# Patient Record
Sex: Female | Born: 1950 | Race: White | Hispanic: No | Marital: Married | Smoking: Never smoker
Health system: Southern US, Community
[De-identification: ages and names within clinical notes are randomized; demographics above are authoritative.]

## PROBLEM LIST (undated history)

## (undated) DIAGNOSIS — K219 Gastro-esophageal reflux disease without esophagitis: Secondary | ICD-10-CM

## (undated) DIAGNOSIS — H9190 Unspecified hearing loss, unspecified ear: Secondary | ICD-10-CM

## (undated) DIAGNOSIS — M1990 Unspecified osteoarthritis, unspecified site: Secondary | ICD-10-CM

## (undated) DIAGNOSIS — D689 Coagulation defect, unspecified: Secondary | ICD-10-CM

## (undated) DIAGNOSIS — Z86010 Personal history of colonic polyps: Secondary | ICD-10-CM

## (undated) DIAGNOSIS — IMO0001 Reserved for inherently not codable concepts without codable children: Secondary | ICD-10-CM

## (undated) DIAGNOSIS — D131 Benign neoplasm of stomach: Secondary | ICD-10-CM

## (undated) DIAGNOSIS — Z9889 Other specified postprocedural states: Secondary | ICD-10-CM

## (undated) DIAGNOSIS — R112 Nausea with vomiting, unspecified: Secondary | ICD-10-CM

## (undated) DIAGNOSIS — K589 Irritable bowel syndrome without diarrhea: Secondary | ICD-10-CM

## (undated) HISTORY — PX: KNEE ARTHROSCOPY: SUR90

## (undated) HISTORY — PX: TOTAL KNEE ARTHROPLASTY: SHX125

## (undated) HISTORY — DX: Personal history of colonic polyps: Z86.010

## (undated) HISTORY — DX: Benign neoplasm of stomach: D13.1

## (undated) HISTORY — PX: WISDOM TOOTH EXTRACTION: SHX21

## (undated) HISTORY — DX: Unspecified osteoarthritis, unspecified site: M19.90

## (undated) HISTORY — PX: COLONOSCOPY: SHX174

## (undated) HISTORY — PX: POLYPECTOMY: SHX149

## (undated) HISTORY — DX: Unspecified hearing loss, unspecified ear: H91.90

## (undated) HISTORY — DX: Coagulation defect, unspecified: D68.9

## (undated) HISTORY — PX: JOINT REPLACEMENT: SHX530

## (undated) HISTORY — PX: ESOPHAGOGASTRODUODENOSCOPY: SHX1529

## (undated) HISTORY — PX: UPPER GASTROINTESTINAL ENDOSCOPY: SHX188

---
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
## 1990-01-08 ENCOUNTER — Encounter (INDEPENDENT_AMBULATORY_CARE_PROVIDER_SITE_OTHER): Payer: Self-pay | Admitting: Gastroenterology

## 1990-01-08 DIAGNOSIS — K209 Esophagitis, unspecified without bleeding: Secondary | ICD-10-CM | POA: Insufficient documentation

## 1990-01-08 DIAGNOSIS — K222 Esophageal obstruction: Secondary | ICD-10-CM | POA: Insufficient documentation

## 1994-05-20 HISTORY — PX: KNEE ARTHROSCOPY: SUR90

## 1995-05-21 HISTORY — PX: HIP ARTHROSCOPY: SHX668

## 1999-03-19 ENCOUNTER — Ambulatory Visit: Admission: RE | Admit: 1999-03-19 | Discharge: 1999-03-19 | Payer: Self-pay

## 1999-03-27 ENCOUNTER — Ambulatory Visit (HOSPITAL_COMMUNITY): Admission: RE | Admit: 1999-03-27 | Discharge: 1999-03-27 | Payer: Self-pay | Admitting: *Deleted

## 2000-05-30 ENCOUNTER — Encounter: Payer: Self-pay | Admitting: Emergency Medicine

## 2000-05-30 ENCOUNTER — Emergency Department (HOSPITAL_COMMUNITY): Admission: EM | Admit: 2000-05-30 | Discharge: 2000-05-31 | Payer: Self-pay | Admitting: Emergency Medicine

## 2002-10-12 ENCOUNTER — Encounter (INDEPENDENT_AMBULATORY_CARE_PROVIDER_SITE_OTHER): Payer: Self-pay | Admitting: Gastroenterology

## 2004-04-04 ENCOUNTER — Ambulatory Visit: Payer: Self-pay | Admitting: Gastroenterology

## 2004-04-20 ENCOUNTER — Ambulatory Visit: Payer: Self-pay | Admitting: Internal Medicine

## 2004-05-11 ENCOUNTER — Ambulatory Visit: Payer: Self-pay | Admitting: Internal Medicine

## 2004-05-29 ENCOUNTER — Ambulatory Visit: Payer: Self-pay | Admitting: Internal Medicine

## 2004-06-12 ENCOUNTER — Ambulatory Visit: Payer: Self-pay | Admitting: Internal Medicine

## 2005-05-07 ENCOUNTER — Ambulatory Visit: Payer: Self-pay | Admitting: Internal Medicine

## 2005-05-13 ENCOUNTER — Emergency Department (HOSPITAL_COMMUNITY): Admission: EM | Admit: 2005-05-13 | Discharge: 2005-05-13 | Payer: Self-pay | Admitting: Emergency Medicine

## 2005-07-02 ENCOUNTER — Ambulatory Visit: Payer: Self-pay | Admitting: Internal Medicine

## 2006-04-21 ENCOUNTER — Encounter: Payer: Self-pay | Admitting: Internal Medicine

## 2006-06-03 ENCOUNTER — Ambulatory Visit: Payer: Self-pay | Admitting: Internal Medicine

## 2006-06-03 LAB — CONVERTED CEMR LAB
BUN: 12 mg/dL (ref 6–23)
Calcium: 9.3 mg/dL (ref 8.4–10.5)
Chloride: 106 meq/L (ref 96–112)
Chol/HDL Ratio, serum: 3.6
Cholesterol: 224 mg/dL (ref 0–200)
Glucose, Bld: 99 mg/dL (ref 70–99)
LDL DIRECT: 143.8 mg/dL
Leukocytes, UA: NEGATIVE
MCHC: 34.2 g/dL (ref 30.0–36.0)
MCV: 86.7 fL (ref 78.0–100.0)
Monocytes Absolute: 0.5 10*3/uL (ref 0.2–0.7)
Monocytes Relative: 7.8 % (ref 3.0–11.0)
Nitrite: NEGATIVE
Platelets: 267 10*3/uL (ref 150–400)
RBC: 4.45 M/uL (ref 3.87–5.11)
Sodium: 139 meq/L (ref 135–145)
Total Bilirubin: 0.7 mg/dL (ref 0.3–1.2)
Total Protein: 6.8 g/dL (ref 6.0–8.3)
Triglyceride fasting, serum: 77 mg/dL (ref 0–149)
Urine Glucose: NEGATIVE mg/dL
Urobilinogen, UA: 0.2 (ref 0.0–1.0)
VLDL: 15 mg/dL (ref 0–40)
WBC: 6.4 10*3/uL (ref 4.5–10.5)

## 2006-06-12 ENCOUNTER — Ambulatory Visit: Payer: Self-pay | Admitting: Internal Medicine

## 2006-07-02 ENCOUNTER — Ambulatory Visit: Payer: Self-pay | Admitting: Internal Medicine

## 2006-07-02 LAB — CONVERTED CEMR LAB
ALT: 17 units/L (ref 0–40)
Total CHOL/HDL Ratio: 2.8
Triglycerides: 78 mg/dL (ref 0–149)
VLDL: 16 mg/dL (ref 0–40)

## 2006-10-15 ENCOUNTER — Ambulatory Visit: Payer: Self-pay | Admitting: Gastroenterology

## 2007-02-16 ENCOUNTER — Encounter: Payer: Self-pay | Admitting: Internal Medicine

## 2007-02-16 ENCOUNTER — Ambulatory Visit: Payer: Self-pay | Admitting: Internal Medicine

## 2007-02-16 DIAGNOSIS — R1011 Right upper quadrant pain: Secondary | ICD-10-CM | POA: Insufficient documentation

## 2007-02-16 DIAGNOSIS — K219 Gastro-esophageal reflux disease without esophagitis: Secondary | ICD-10-CM | POA: Insufficient documentation

## 2007-02-16 DIAGNOSIS — E785 Hyperlipidemia, unspecified: Secondary | ICD-10-CM | POA: Insufficient documentation

## 2007-02-16 LAB — CONVERTED CEMR LAB
ALT: 18 units/L (ref 0–35)
AST: 21 units/L (ref 0–37)
Amylase: 74 units/L (ref 27–131)
Lipase: 35 units/L (ref 11.0–59.0)

## 2007-02-17 ENCOUNTER — Ambulatory Visit (HOSPITAL_COMMUNITY): Admission: RE | Admit: 2007-02-17 | Discharge: 2007-02-17 | Payer: Self-pay | Admitting: Internal Medicine

## 2007-02-25 ENCOUNTER — Ambulatory Visit (HOSPITAL_COMMUNITY): Admission: RE | Admit: 2007-02-25 | Discharge: 2007-02-25 | Payer: Self-pay | Admitting: Internal Medicine

## 2007-07-15 LAB — CONVERTED CEMR LAB: Pap Smear: NORMAL

## 2007-07-23 ENCOUNTER — Encounter: Payer: Self-pay | Admitting: Internal Medicine

## 2007-07-28 ENCOUNTER — Encounter: Payer: Self-pay | Admitting: Internal Medicine

## 2007-08-06 ENCOUNTER — Ambulatory Visit: Payer: Self-pay | Admitting: Internal Medicine

## 2007-08-11 ENCOUNTER — Ambulatory Visit: Payer: Self-pay | Admitting: Internal Medicine

## 2007-08-11 DIAGNOSIS — R55 Syncope and collapse: Secondary | ICD-10-CM | POA: Insufficient documentation

## 2007-08-12 ENCOUNTER — Ambulatory Visit: Payer: Self-pay

## 2007-08-12 ENCOUNTER — Encounter: Payer: Self-pay | Admitting: Internal Medicine

## 2007-08-27 ENCOUNTER — Encounter: Payer: Self-pay | Admitting: Internal Medicine

## 2007-08-27 ENCOUNTER — Ambulatory Visit: Payer: Self-pay

## 2007-10-05 ENCOUNTER — Ambulatory Visit: Payer: Self-pay | Admitting: Internal Medicine

## 2007-10-05 LAB — CONVERTED CEMR LAB
ALT: 17 units/L (ref 0–35)
Alkaline Phosphatase: 60 units/L (ref 39–117)
Basophils Absolute: 0.1 10*3/uL (ref 0.0–0.1)
Basophils Relative: 0.9 % (ref 0.0–1.0)
Calcium: 9.4 mg/dL (ref 8.4–10.5)
Cholesterol: 237 mg/dL (ref 0–200)
Crystals: NEGATIVE
GFR calc Af Amer: 83 mL/min
HDL: 54.9 mg/dL (ref >39.0)
Hemoglobin, Urine: NEGATIVE
Hemoglobin: 13.2 g/dL (ref 12.0–15.0)
Ketones, ur: NEGATIVE mg/dL
Lymphocytes Relative: 22.1 % (ref 12.0–46.0)
MCV: 87.2 fL (ref 78.0–100.0)
Monocytes Absolute: 0.5 10*3/uL (ref 0.1–1.0)
Neutro Abs: 4.8 10*3/uL (ref 1.4–7.7)
Neutrophils Relative %: 67.3 % (ref 43.0–77.0)
Potassium: 4 meq/L (ref 3.5–5.1)
RBC: 4.37 M/uL (ref 3.87–5.11)
Sodium: 141 meq/L (ref 135–145)
Specific Gravity, Urine: 1.02 (ref 1.000–1.03)
TSH: 1.93 microintl units/mL (ref 0.35–5.50)
Total Bilirubin: 0.6 mg/dL (ref 0.3–1.2)
Total CHOL/HDL Ratio: 4.3
Total Protein: 7.3 g/dL (ref 6.0–8.3)
Triglycerides: 99 mg/dL (ref 0–149)
Urine Glucose: NEGATIVE mg/dL

## 2007-10-10 ENCOUNTER — Encounter: Payer: Self-pay | Admitting: *Deleted

## 2007-10-10 DIAGNOSIS — Z9189 Other specified personal risk factors, not elsewhere classified: Secondary | ICD-10-CM | POA: Insufficient documentation

## 2007-10-10 DIAGNOSIS — G43909 Migraine, unspecified, not intractable, without status migrainosus: Secondary | ICD-10-CM | POA: Insufficient documentation

## 2007-10-10 DIAGNOSIS — F411 Generalized anxiety disorder: Secondary | ICD-10-CM | POA: Insufficient documentation

## 2007-10-10 DIAGNOSIS — Z9089 Acquired absence of other organs: Secondary | ICD-10-CM | POA: Insufficient documentation

## 2007-10-10 DIAGNOSIS — Z8711 Personal history of peptic ulcer disease: Secondary | ICD-10-CM | POA: Insufficient documentation

## 2007-10-10 DIAGNOSIS — Z96649 Presence of unspecified artificial hip joint: Secondary | ICD-10-CM | POA: Insufficient documentation

## 2007-10-10 DIAGNOSIS — Z9889 Other specified postprocedural states: Secondary | ICD-10-CM | POA: Insufficient documentation

## 2007-10-13 ENCOUNTER — Telehealth: Payer: Self-pay | Admitting: Internal Medicine

## 2007-10-13 ENCOUNTER — Ambulatory Visit: Payer: Self-pay | Admitting: Internal Medicine

## 2007-11-06 ENCOUNTER — Ambulatory Visit: Payer: Self-pay | Admitting: Internal Medicine

## 2007-11-06 LAB — CONVERTED CEMR LAB
ALT: 30 units/L (ref 0–35)
Alkaline Phosphatase: 67 units/L (ref 39–117)
Bilirubin, Direct: 0.1 mg/dL (ref 0.0–0.3)
Cholesterol: 165 mg/dL (ref 0–200)
HDL: 62.4 mg/dL (ref >39.0)
Total Bilirubin: 0.6 mg/dL (ref 0.3–1.2)
Total Protein: 7.1 g/dL (ref 6.0–8.3)
Triglycerides: 53 mg/dL (ref 0–149)

## 2007-11-10 ENCOUNTER — Encounter: Payer: Self-pay | Admitting: Internal Medicine

## 2007-11-12 ENCOUNTER — Telehealth: Payer: Self-pay | Admitting: Internal Medicine

## 2008-03-29 ENCOUNTER — Ambulatory Visit: Payer: Self-pay | Admitting: Internal Medicine

## 2008-04-18 ENCOUNTER — Encounter: Payer: Self-pay | Admitting: Internal Medicine

## 2008-04-18 ENCOUNTER — Ambulatory Visit: Payer: Self-pay | Admitting: Internal Medicine

## 2008-04-20 ENCOUNTER — Encounter: Payer: Self-pay | Admitting: Internal Medicine

## 2008-05-02 ENCOUNTER — Ambulatory Visit: Payer: Self-pay | Admitting: Internal Medicine

## 2008-05-02 DIAGNOSIS — K589 Irritable bowel syndrome without diarrhea: Secondary | ICD-10-CM | POA: Insufficient documentation

## 2008-05-02 DIAGNOSIS — K594 Anal spasm: Secondary | ICD-10-CM | POA: Insufficient documentation

## 2008-07-02 ENCOUNTER — Emergency Department (HOSPITAL_COMMUNITY): Admission: EM | Admit: 2008-07-02 | Discharge: 2008-07-02 | Payer: Self-pay | Admitting: Emergency Medicine

## 2008-07-13 ENCOUNTER — Telehealth: Payer: Self-pay | Admitting: Internal Medicine

## 2008-07-13 ENCOUNTER — Ambulatory Visit: Payer: Self-pay | Admitting: Internal Medicine

## 2008-07-13 DIAGNOSIS — H8309 Labyrinthitis, unspecified ear: Secondary | ICD-10-CM | POA: Insufficient documentation

## 2008-09-08 IMAGING — CR DG CHEST 2V
2 series · 2 of 2 positions shown · non-contrast
Comparison: None

CLINICAL DATA: SYNCOPE, R/O SUBCLAVIAN STEAL, CERVICAL RIB;  LEFT
ARM NUMBNESS.  SHORT OF BREATH.

CHEST - 2 VIEW

[view not recorded (1 of 2)]
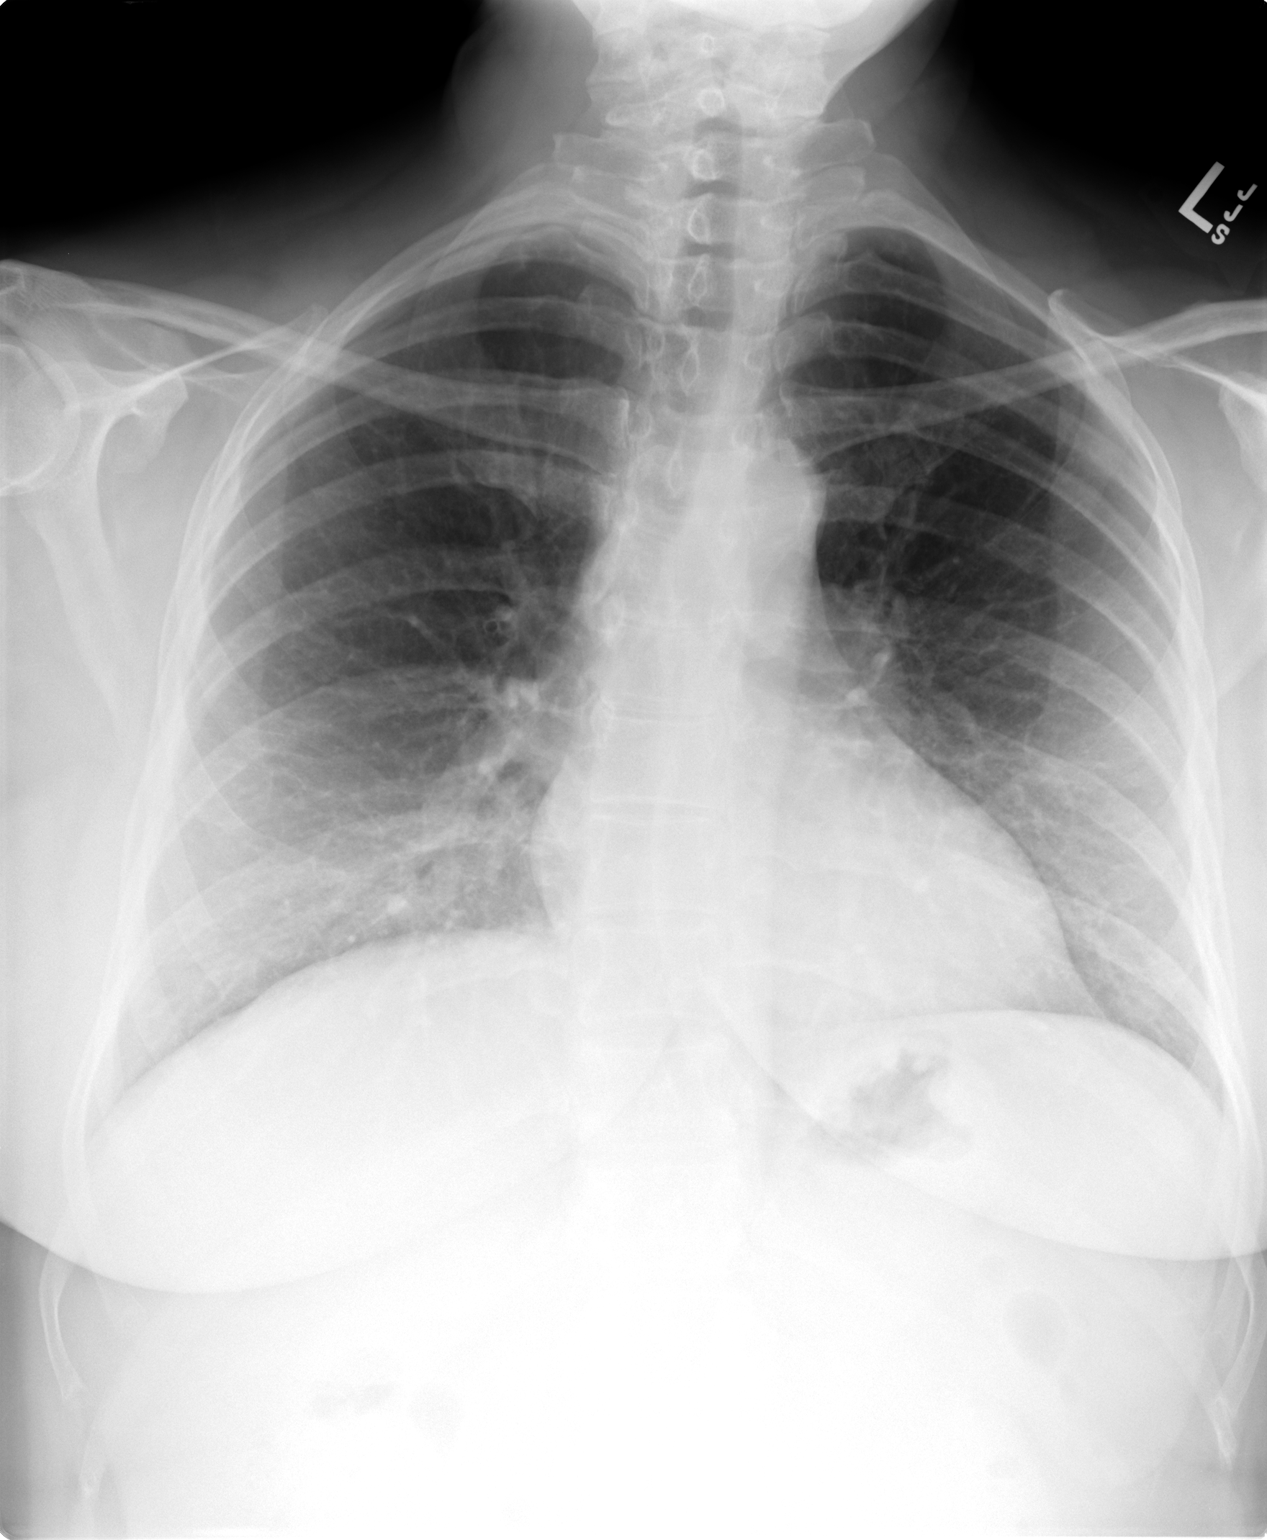

[view not recorded (2 of 2)]
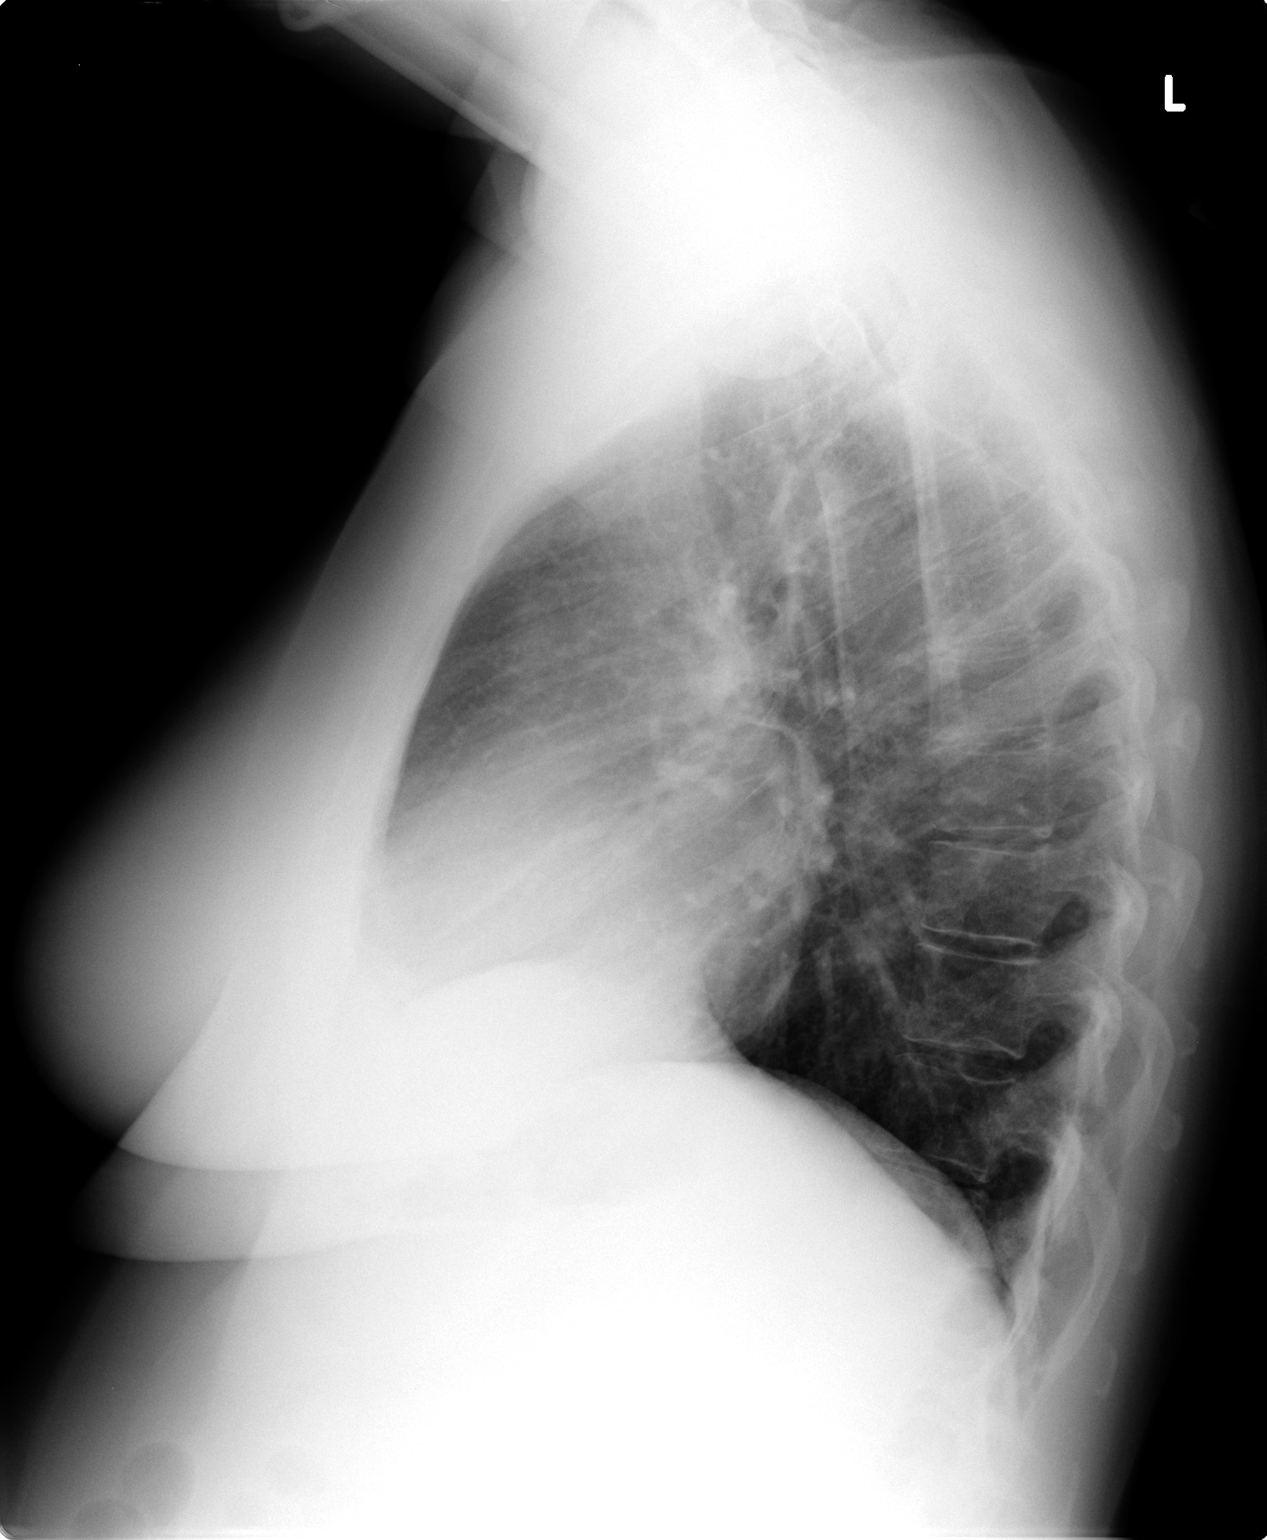

[2 of 2 positions shown; findings below may reference images not displayed]

FINDINGS: Heart size is normal.  The mediastinum is unremarkable.
There are some slightly increased interstitial markings but there
is no active infiltrate, mass, effusion or collapse.  No cervical
ribs are seen.  Bony structures are otherwise normal.
IMPRESSION: No active disease.

## 2008-09-08 IMAGING — CR DG CERVICAL SPINE WITH FLEX & EXTEND
7 series · 7 of 7 positions shown · non-contrast
Comparison: None

CLINICAL DATA: SYNCOPE, R/O SUBCLAVIAN STEAL, CERVICAL RIB;

CERVICAL SPINE COMPLETE WITH FLEXION AND EXTENSION VIEWS

[view not recorded (1 of 7)]
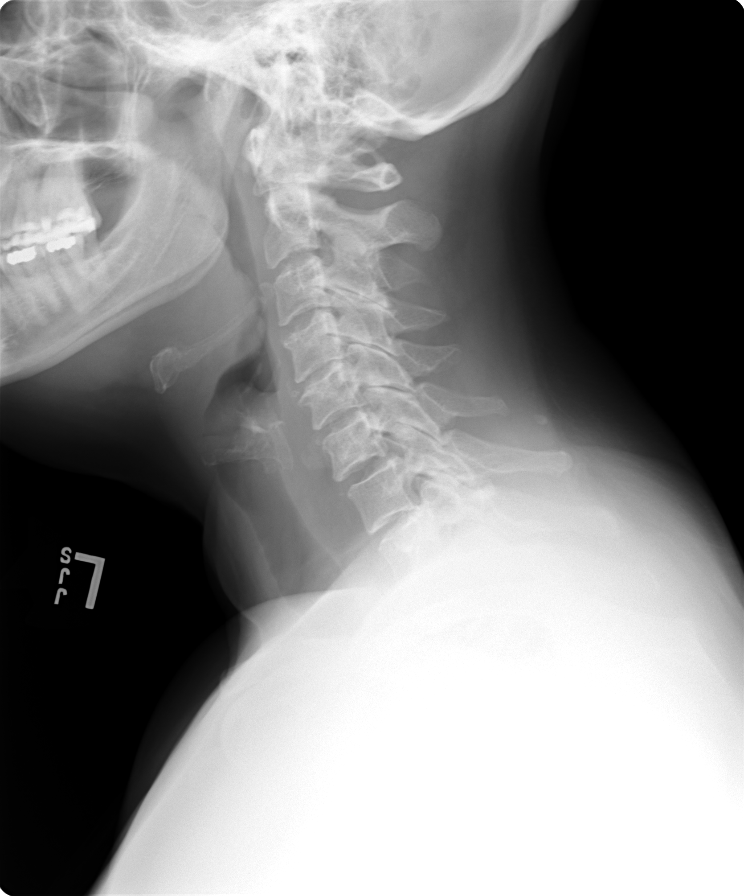

[view not recorded (2 of 7)]
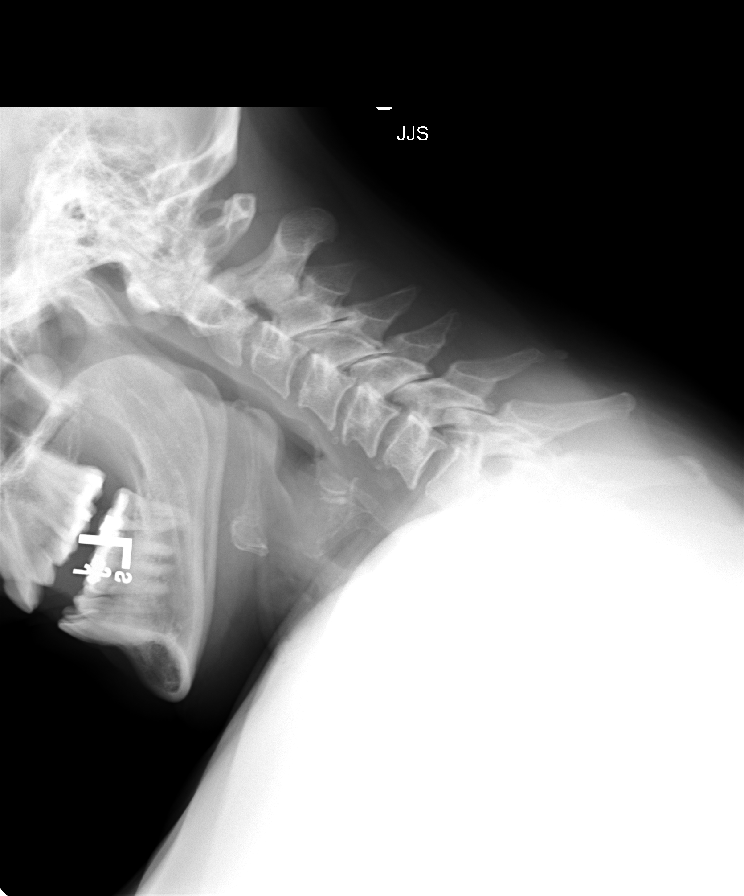

[view not recorded (3 of 7)]
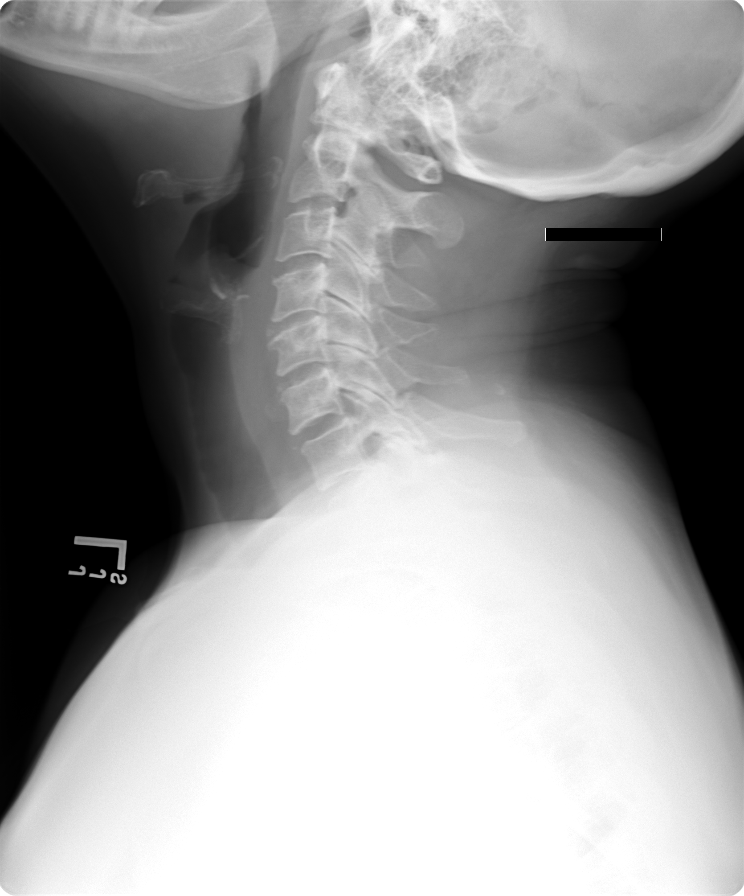

[view not recorded (4 of 7)]
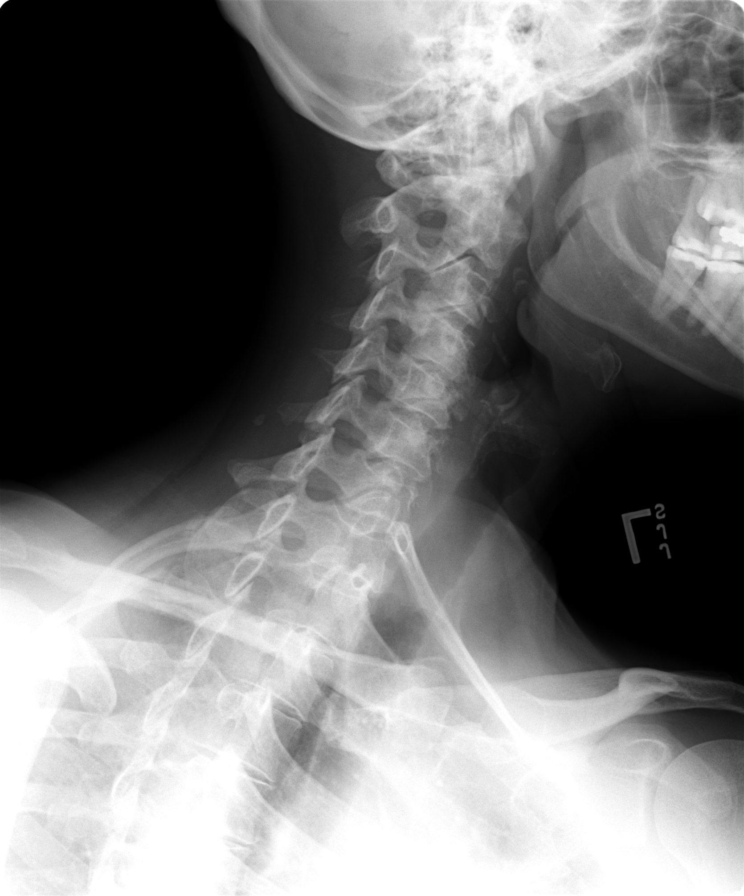

[view not recorded (5 of 7)]
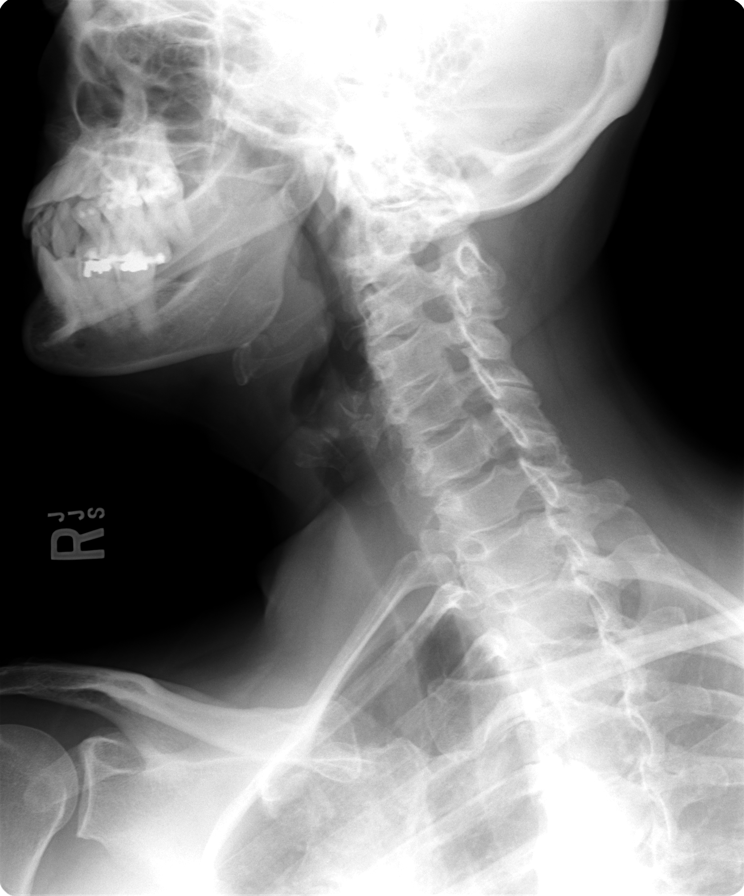

[view not recorded (6 of 7)]
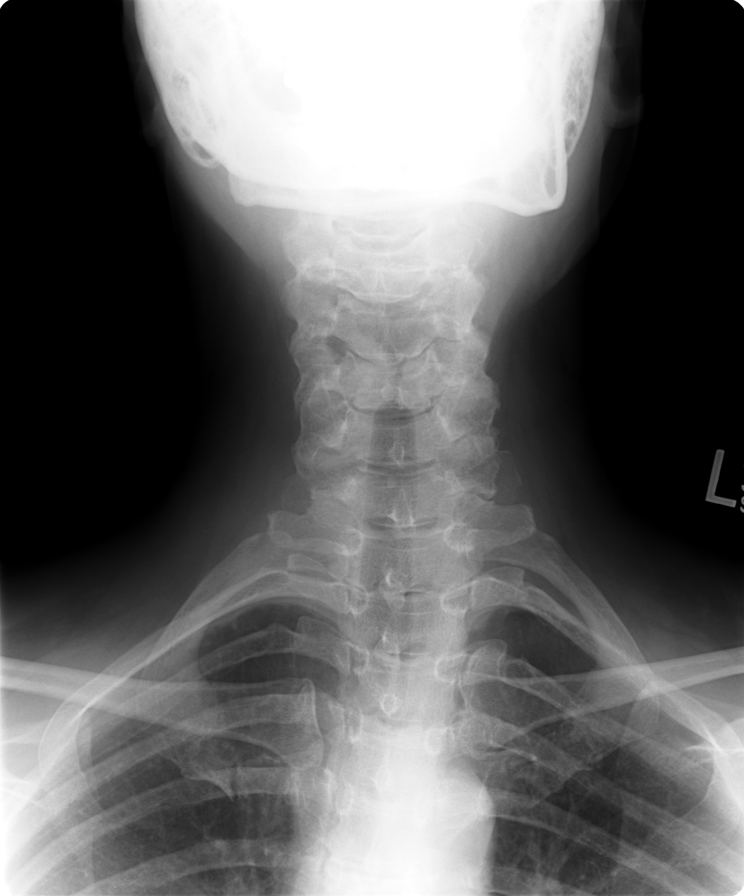

[view not recorded (7 of 7)]
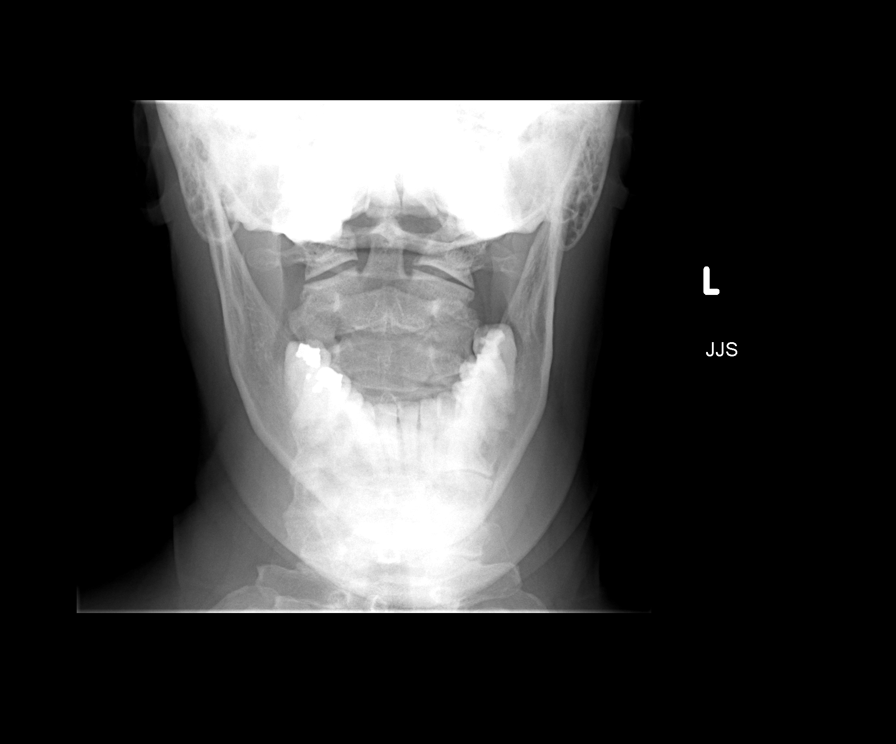

[7 of 7 positions shown; findings below may reference images not displayed]

FINDINGS: Alignment is normal.  There is degenerative spondylosis
at C4-5, C5-6, C6-7 and C7-C1.  With flexion and extension, no
abnormal motion occurs.  There is mild foraminal encroachment by
osteophytes on the right at C3-4 and C4-5.  No foraminal
encroachment seen on the left.  No cervical ribs.
IMPRESSION: Ordinary spondylosis as described

## 2009-01-10 ENCOUNTER — Ambulatory Visit: Payer: Self-pay | Admitting: Internal Medicine

## 2009-01-10 LAB — CONVERTED CEMR LAB
ALT: 16 units/L (ref 0–35)
Alkaline Phosphatase: 60 units/L (ref 39–117)
Basophils Absolute: 0.1 10*3/uL (ref 0.0–0.1)
Basophils Relative: 1 % (ref 0.0–3.0)
Bilirubin Urine: NEGATIVE
CO2: 26 meq/L (ref 19–32)
Chloride: 112 meq/L (ref 96–112)
Cholesterol: 157 mg/dL (ref 0–200)
Eosinophils Absolute: 0.1 10*3/uL (ref 0.0–0.7)
Eosinophils Relative: 1.4 % (ref 0.0–5.0)
HCT: 37.5 % (ref 36.0–46.0)
Ketones, ur: NEGATIVE mg/dL
LDL Cholesterol: 88 mg/dL (ref 0–99)
Lymphocytes Relative: 25.2 % (ref 12.0–46.0)
MCHC: 34.3 g/dL (ref 30.0–36.0)
Monocytes Absolute: 0.3 10*3/uL (ref 0.1–1.0)
Neutro Abs: 7.1 10*3/uL (ref 1.4–7.7)
Platelets: 208 10*3/uL (ref 150.0–400.0)
RDW: 12.3 % (ref 11.5–14.6)
Sodium: 142 meq/L (ref 135–145)
Specific Gravity, Urine: 1.02 (ref 1.000–1.030)
Total Bilirubin: 0.6 mg/dL (ref 0.3–1.2)
Total Protein, Urine: NEGATIVE mg/dL
Triglycerides: 59 mg/dL (ref 0.0–149.0)
Urobilinogen, UA: 0.2 (ref 0.0–1.0)
WBC: 10.1 10*3/uL (ref 4.5–10.5)

## 2009-01-16 ENCOUNTER — Telehealth: Payer: Self-pay | Admitting: Internal Medicine

## 2009-01-16 ENCOUNTER — Ambulatory Visit: Payer: Self-pay | Admitting: Internal Medicine

## 2009-02-21 ENCOUNTER — Encounter: Payer: Self-pay | Admitting: Internal Medicine

## 2009-09-25 ENCOUNTER — Ambulatory Visit: Payer: Self-pay | Admitting: Internal Medicine

## 2009-09-25 DIAGNOSIS — A088 Other specified intestinal infections: Secondary | ICD-10-CM | POA: Insufficient documentation

## 2009-10-19 ENCOUNTER — Ambulatory Visit: Payer: Self-pay | Admitting: Internal Medicine

## 2009-10-19 ENCOUNTER — Observation Stay (HOSPITAL_COMMUNITY): Admission: AD | Admit: 2009-10-19 | Discharge: 2009-10-20 | Payer: Self-pay | Admitting: Internal Medicine

## 2009-10-23 ENCOUNTER — Telehealth: Payer: Self-pay | Admitting: Internal Medicine

## 2009-10-27 ENCOUNTER — Ambulatory Visit: Payer: Self-pay | Admitting: Internal Medicine

## 2010-04-19 ENCOUNTER — Encounter: Payer: Self-pay | Admitting: Internal Medicine

## 2010-05-11 ENCOUNTER — Ambulatory Visit: Payer: Self-pay | Admitting: Internal Medicine

## 2010-05-11 LAB — CONVERTED CEMR LAB
Albumin: 3.7 g/dL (ref 3.5–5.2)
Alkaline Phosphatase: 82 units/L (ref 39–117)
BUN: 15 mg/dL (ref 6–23)
Basophils Absolute: 0 10*3/uL (ref 0.0–0.1)
Basophils Relative: 0.5 % (ref 0.0–3.0)
Bilirubin Urine: NEGATIVE
Calcium: 9.2 mg/dL (ref 8.4–10.5)
Chloride: 104 meq/L (ref 96–112)
Cholesterol: 180 mg/dL (ref 0–200)
Eosinophils Absolute: 0.1 10*3/uL (ref 0.0–0.7)
Glucose, Bld: 90 mg/dL (ref 70–99)
HCT: 37.6 % (ref 36.0–46.0)
HDL: 71 mg/dL (ref >39.00)
MCHC: 34.1 g/dL (ref 30.0–36.0)
Neutrophils Relative %: 63.5 % (ref 43.0–77.0)
Nitrite: NEGATIVE
Sodium: 138 meq/L (ref 135–145)
TSH: 1.24 microintl units/mL (ref 0.35–5.50)
Total Bilirubin: 0.3 mg/dL (ref 0.3–1.2)
Total Protein, Urine: NEGATIVE mg/dL
Total Protein: 6.9 g/dL (ref 6.0–8.3)
Urine Glucose: NEGATIVE mg/dL

## 2010-05-18 ENCOUNTER — Encounter: Payer: Self-pay | Admitting: Internal Medicine

## 2010-05-18 ENCOUNTER — Ambulatory Visit: Payer: Self-pay | Admitting: Internal Medicine

## 2010-05-18 ENCOUNTER — Other Ambulatory Visit
Admission: RE | Admit: 2010-05-18 | Discharge: 2010-05-18 | Payer: Self-pay | Source: Home / Self Care | Admitting: Internal Medicine

## 2010-05-24 ENCOUNTER — Encounter: Payer: Self-pay | Admitting: Internal Medicine

## 2010-06-10 ENCOUNTER — Encounter: Payer: Self-pay | Admitting: Internal Medicine

## 2010-06-19 NOTE — Assessment & Plan Note (Signed)
Summary: VOMITING AND DIARRHEA SINCE SAT/NO FEVER/NWS   Vital Signs:  Patient profile:   60 year old female Height:      71 inches Weight:      180 pounds BMI:     25.20 O2 Sat:      97 % on Room air Temp:     98.6 degrees F oral Pulse rate:   83 / minute BP sitting:   110 / 70  (left arm) Cuff size:   regular  Vitals Entered By: Bill Salinas CMA (Sep 25, 2009 2:06 PM)  O2 Flow:  Room air CC: pt c/o vomitting and diarrhea x 3 days/ ab   Primary Care Provider:  Norins  CC:  pt c/o vomitting and diarrhea x 3 days/ ab.  History of Present Illness: Patient reports diarrhea and vomiting since Friday night (three days ago).  At first she had diarrhea up to 5 times an hour.  Her stools are watery with no blood.  That day she had to buy depends because she was unable to make it to the restroom in time.  After the first 24 hours the frequency of diarrhea decreased.  She has also had vomiting which has improved over the past day.  She says she has been unable to keep significant amounts of food, water or medications down by mouth.  She reports feeling weak but denies fever or chills.  She tried immodium the first day with little relief and has tried phenergan suppositories with a reduction in vomiting.  She continues to have nausea despite phenergan therapy.  She reports increased heartburn as well as gas and bloating pain.  Her son is also in the office with similar symptoms.    Current Medications (verified): 1)  Femhrt Low Dose 0.5-2.5 Mg-Mcg  Tabs (Norethindrone-Eth Estradiol) .... Once Daily 2)  Hyoscyamine Sulfate 0.125 Mg  Subl (Hyoscyamine Sulfate) .Marland Kitchen.. 1-2 Every 4 Hrs As Needed For Abdominal or Rectal Pain 3)  Protonix 40 Mg  Tbec (Pantoprazole Sodium) .Marland Kitchen.. 1 Once Daily 4)  Caltrate 600+d 600-400 Mg-Unit  Tabs (Calcium Carbonate-Vitamin D) .Marland Kitchen.. 1 Once Daily 5)  Celebrex 100 Mg  Caps (Celecoxib) .... Two Times A Day 6)  Imitrex 100 Mg Tabs (Sumatriptan Succinate) .Marland Kitchen.. 1 As Needed  Migraine, May Repeat in 2 Hours If Needed 7)  Crestor 10 Mg  Tabs (Rosuvastatin Calcium) .... Take 1 Tablet By Mouth Once A Day 8)  Womens Multivitamin Plus   Tabs (Multiple Vitamins-Minerals) .Marland Kitchen.. 1 Tab Daily 9)  Meclizine Hcl 25 Mg Tabs (Meclizine Hcl) .... 1/2 or 1 Tablet Every 6 Hours As Needed  Allergies (verified): No Known Drug Allergies  Past History:  Past Medical History: Last updated: 05/02/2008 Anxiety Hx Peptic Ulcer Disease Atypical Chest Pain Migraines Syncope Hyperlipidemia GERD     Past Surgical History: Last updated: 10/10/2007 ARTHROSCOPY, HX OF LEFT HIP (ICD-V45.4) ARTHROSCOPY, LEFT KNEE, HX OF (ICD-V45.89) TONSILLECTOMY, HX OF (ICD-V45.79) HIP REPLACEMENT, BILATERAL, HX OF (ICD-V43.64)  Family History: Last updated: 08/11/2007 father-'27: CAD/MI, HTN, Lipids mother - '22: Alzheimer's-bedbound; h/o DVTs 2nd degree kin with CAD Neg-DM, breast cancer Brother - colon cancer.  Social History: Last updated: 01/16/2009 HSG married '75 2 sons - '78, '84 retired - laid off. Takes care of parents. Brother died of metatstatic colon cancer Sept '09  Review of Systems       The patient complains of anorexia, abdominal pain, severe indigestion/heartburn, and incontinence.  The patient denies fever, melena, and hematochezia.  Physical Exam  General:  alert and well-developed.   Head:  normocephalic and atraumatic.   Eyes:  vision grossly intact, pupils equal, pupils round, and pupils reactive to light.   Mouth:  MM dry Heart:  normal rate, regular rhythm, no murmur, no gallop, and no rub.   Abdomen:  soft, normal bowel sounds, no distention, no masses, no guarding, no rigidity, no hepatomegaly, no splenomegaly, and epigastric tenderness.     Impression & Recommendations:  Problem # 1:  VIRAL GASTROENTERITIS (ICD-008.8) Patient has a three day history of severe diarrhea and vomiting which is consistent with a viral gastroenteritis.  She was  encouraged to increase hydration, especially with sports drinks that will replenish electrolytes.    Plan -She was encouraged to try to replace each BM with at least 200cc of fluid.            Symptomatically, she was recommended to continue to use Immodium for diarrhea and Phenergan for nausea and vomiting.            A bland diet is recommended.  Complete Medication List: 1)  Femhrt Low Dose 0.5-2.5 Mg-mcg Tabs (Norethindrone-eth estradiol) .... Once daily 2)  Hyoscyamine Sulfate 0.125 Mg Subl (Hyoscyamine sulfate) .Marland Kitchen.. 1-2 every 4 hrs as needed for abdominal or rectal pain 3)  Protonix 40 Mg Tbec (Pantoprazole sodium) .Marland Kitchen.. 1 once daily 4)  Caltrate 600+d 600-400 Mg-unit Tabs (Calcium carbonate-vitamin d) .Marland Kitchen.. 1 once daily 5)  Celebrex 100 Mg Caps (Celecoxib) .... Two times a day 6)  Imitrex 100 Mg Tabs (Sumatriptan succinate) .Marland Kitchen.. 1 as needed migraine, may repeat in 2 hours if needed 7)  Crestor 10 Mg Tabs (Rosuvastatin calcium) .... Take 1 tablet by mouth once a day 8)  Womens Multivitamin Plus Tabs (Multiple vitamins-minerals) .Marland Kitchen.. 1 tab daily 9)  Meclizine Hcl 25 Mg Tabs (Meclizine hcl) .... 1/2 or 1 tablet every 6 hours as needed

## 2010-06-19 NOTE — Initial Assessments (Signed)
Summary: NAUSEA/NWS   Vital Signs:  Patient profile:   60 year old female O2 Sat:      99 % on Room air Temp:     97.6 degrees F BP sitting:   100 / 68  O2 Flow:  Room air  Primary Care Reveca Desmarais:  Norins   History of Present Illness: Patient presents acutely for a 18 hour history N/V, RUQ abdominal pain and diarrhea. She reports that she has had previous episodes RUQ abdominal pain with N/V/D. She was unable to hold a promethazine suppository. She has been unable to keep down fluids. She is feeling weak and has continue pain. She does report that pain will radiate to the area of the right scapula. Stools have been light colored.   Allergies: No Known Drug Allergies  Past History:  Past Medical History: Last updated: 05/02/2008 Anxiety Hx Peptic Ulcer Disease Atypical Chest Pain Migraines Syncope Hyperlipidemia GERD     Past Surgical History: Last updated: 10/10/2007 ARTHROSCOPY, HX OF LEFT HIP (ICD-V45.4) ARTHROSCOPY, LEFT KNEE, HX OF (ICD-V45.89) TONSILLECTOMY, HX OF (ICD-V45.79) HIP REPLACEMENT, BILATERAL, HX OF (ICD-V43.64)  Family History: Last updated: 08/11/2007 father-'27: CAD/MI, HTN, Lipids mother - '22: Alzheimer's-bedbound; h/o DVTs 2nd degree kin with CAD Neg-DM, breast cancer Brother - colon cancer.  Social History: Last updated: 01/16/2009 HSG married '75 2 sons - '78, '84 retired - laid off. Takes care of parents. Brother died of metatstatic colon cancer Sept '09  Review of Systems       The patient complains of anorexia, weight loss, and abdominal pain.  The patient denies fever, weight gain, vision loss, decreased hearing, hoarseness, chest pain, syncope, dyspnea on exertion, peripheral edema, prolonged cough, headaches, melena, hematochezia, incontinence, muscle weakness, difficulty walking, depression, and abnormal bleeding.    Physical Exam  General:  WNWD white female who appears ill and washed out. she is uncomfortable but not in  acute distress Head:  normocephalic and atraumatic.   Eyes:  vision grossly intact, pupils equal, pupils round, corneas and lenses clear, and no injection.   Ears:  TMs normal Nose:  no external deformity and no external erythema.   Mouth:  good dentition and no gingival abnormalities.  No oral lesions Neck:  supple, full ROM, no thyromegaly, and no carotid bruits.   Chest Wall:  no deformities.   Breasts:  deferred Lungs:  Normal respiratory effort, chest expands symmetrically. Lungs are clear to auscultation, no crackles or wheezes. Heart:  Normal rate and regular rhythm. S1 and S2 normal without gallop, murmur, click, rub or other extra sounds. Abdomen:  BX+ x 4, tender to percussion and palpation in the RUQ, no palpable GB bulb, no guarding or rebound, no hepatomegaly. Some tenderness in the epigastrum.  Rectal:  deferred Msk:  normal ROM, no joint tenderness, no joint swelling, no joint warmth, and no joint instability.   Pulses:  2 + radial pulse Extremities:  No clubbing, cyanosis, edema, or deformity noted with normal full range of motion of all joints.   Neurologic:  alert & oriented X3, cranial nerves II-XII intact, sensation intact to pinprick, and gait normal.   Skin:  pale, no lesions Cervical Nodes:  no anterior cervical adenopathy and no posterior cervical adenopathy.   Psych:  Oriented X3, memory intact for recent and remote, normally interactive, and good eye contact.     Impression & Recommendations:  Problem # 1:  ABDOMINAL PAIN, RIGHT UPPER QUADRANT (ICD-789.01)  Patient with recurrent RUQ abdominal pain and tenderness, N/V,  yellow stools and bilious emesis. She is afebrile. BP is slightly down. Suspect GB disease - older female, colicky pain, light-yellowish stool and pain c/w GB inflammation.  Plan - 24 hr obs-regular medical bed.           IVF, IV zofran 4mg  q 6, IV MS 2 mg q 3 as needed pain           Lab - CBCD, CMet, Lipase, Amylase, blood culutres x 2 sites            GB U/S - tonight if possible.           Rocephin 1 g stat           Will continue most home medications - see included med list.   Orders: No Charge Patient Arrived (NCPA0) (NCPA0)  Complete Medication List: 1)  Femhrt Low Dose 0.5-2.5 Mg-mcg Tabs (Norethindrone-eth estradiol) .... Once daily 2)  Hyoscyamine Sulfate 0.125 Mg Subl (Hyoscyamine sulfate) .Marland Kitchen.. 1-2 every 4 hrs as needed for abdominal or rectal pain 3)  Protonix 40 Mg Tbec (Pantoprazole sodium) .Marland Kitchen.. 1 once daily 4)  Caltrate 600+d 600-400 Mg-unit Tabs (Calcium carbonate-vitamin d) .Marland Kitchen.. 1 once daily 5)  Celebrex 100 Mg Caps (Celecoxib) .... Two times a day 6)  Imitrex 100 Mg Tabs (Sumatriptan succinate) .Marland Kitchen.. 1 as needed migraine, may repeat in 2 hours if needed 7)  Crestor 10 Mg Tabs (Rosuvastatin calcium) .... Take 1 tablet by mouth once a day 8)  Womens Multivitamin Plus Tabs (Multiple vitamins-minerals) .Marland Kitchen.. 1 tab daily 9)  Meclizine Hcl 25 Mg Tabs (Meclizine hcl) .... 1/2 or 1 tablet every 6 hours as needed

## 2010-06-19 NOTE — Progress Notes (Signed)
  Phone Note Outgoing Call   Summary of Call: post. blood culture, gram post. cocci in clusters. Waiting on fax from [facility] Initial call taken by: Ami Bullins CMA,  October 23, 2009 8:23 AM

## 2010-06-19 NOTE — Assessment & Plan Note (Signed)
Summary: post hosp per norins-lb   Vital Signs:  Patient profile:   60 year old female Weight:      181 pounds BMI:     25.34 Temp:     97.9 degrees F Pulse rate:   65 / minute BP sitting:   100 / 60 Cuff size:   regular  Vitals Entered By: Lamar Sprinkles, CMA (October 27, 2009 9:41 AM) CC: Hosptial f/u Comments Celebrex was put on HOLD   Primary Care Provider:  Norins  CC:  Hosptial f/u.  History of Present Illness: Patient recently hospitalized with N/V, RUQ abdominal pain. During her obs stay she had normal GB U/S, normal labs and a normal HIDDA scan. She was seen by surgery but did not need surgery. since being home she continues to have abdominal bloating and discomfort, lower abdominal bloating and irregular bowel habit. she is afraid to eat due to discomfort.  Chart reviewed: long history of GERD, IBS and rectal pain. She last had colonoscopy/EGD Nov '09 with no significant abnormalities except for gastric polyps. At that time she was taken off of reglan but continued on PPI therapy.   Current Medications (verified): 1)  Femhrt Low Dose 0.5-2.5 Mg-Mcg  Tabs (Norethindrone-Eth Estradiol) .... Once Daily 2)  Hyoscyamine Sulfate 0.125 Mg  Subl (Hyoscyamine Sulfate) .Marland Kitchen.. 1-2 Every 4 Hrs As Needed For Abdominal or Rectal Pain 3)  Protonix 40 Mg  Tbec (Pantoprazole Sodium) .Marland Kitchen.. 1 Once Daily 4)  Caltrate 600+d 600-400 Mg-Unit  Tabs (Calcium Carbonate-Vitamin D) .Marland Kitchen.. 1 Once Daily 5)  Celebrex 100 Mg  Caps (Celecoxib) .... Two Times A Day 6)  Imitrex 100 Mg Tabs (Sumatriptan Succinate) .Marland Kitchen.. 1 As Needed Migraine, May Repeat in 2 Hours If Needed 7)  Crestor 10 Mg  Tabs (Rosuvastatin Calcium) .... Take 1 Tablet By Mouth Once A Day 8)  Womens Multivitamin Plus   Tabs (Multiple Vitamins-Minerals) .Marland Kitchen.. 1 Tab Daily 9)  Meclizine Hcl 25 Mg Tabs (Meclizine Hcl) .... 1/2 or 1 Tablet Every 6 Hours As Needed  Allergies (verified): No Known Drug Allergies PMH-FH-SH reviewed-no changes except  otherwise noted  Review of Systems       The patient complains of weight loss.  The patient denies anorexia, fever, weight gain, chest pain, syncope, dyspnea on exertion, abdominal pain, hematochezia, severe indigestion/heartburn, muscle weakness, abnormal bleeding, and enlarged lymph nodes.    Physical Exam  General:  Well-developed,well-nourished,in no acute distress; alert,appropriate and cooperative throughout examination Head:  normocephalic and atraumatic.   Lungs:  normal respiratory effort and normal breath sounds.   Heart:  normal rate and regular rhythm.   Abdomen:  soft, non-tender, normal bowel sounds, no guarding, no rigidity, and no hepatomegaly.     Impression & Recommendations:  Problem # 1:  IRRITABLE BOWEL SYNDROME (ICD-564.1) Patient has ruled out for GB disease. Working diagnosis is  IBS.  Plan - resume use of Levsin sl. If no relief of symptoms will refer back to Dr. Leone Payor  Complete Medication List: 1)  Femhrt Low Dose 0.5-2.5 Mg-mcg Tabs (Norethindrone-eth estradiol) .... Once daily 2)  Hyoscyamine Sulfate 0.125 Mg Subl (Hyoscyamine sulfate) .Marland Kitchen.. 1-2 every 4 hrs as needed for abdominal or rectal pain 3)  Protonix 40 Mg Tbec (Pantoprazole sodium) .Marland Kitchen.. 1 once daily 4)  Caltrate 600+d 600-400 Mg-unit Tabs (Calcium carbonate-vitamin d) .Marland Kitchen.. 1 once daily 5)  Celebrex 100 Mg Caps (Celecoxib) .... Two times a day 6)  Imitrex 100 Mg Tabs (Sumatriptan succinate) .Marland Kitchen.. 1 as needed migraine,  may repeat in 2 hours if needed 7)  Crestor 10 Mg Tabs (Rosuvastatin calcium) .... Take 1 tablet by mouth once a day 8)  Womens Multivitamin Plus Tabs (Multiple vitamins-minerals) .Marland Kitchen.. 1 tab daily 9)  Meclizine Hcl 25 Mg Tabs (Meclizine hcl) .... 1/2 or 1 tablet every 6 hours as needed Prescriptions: HYOSCYAMINE SULFATE 0.125 MG  SUBL (HYOSCYAMINE SULFATE) 1-2 every 4 hrs as needed for abdominal or rectal pain  #90 x 6   Entered and Authorized by:   Jacques Navy MD   Signed by:    Jacques Navy MD on 10/27/2009   Method used:   Electronically to        [id number] (retail)       [address]       [place], [place]  [id number]       Ph: [phone]       Fax: [phone]   RxID:   [id number]

## 2010-06-21 NOTE — Letter (Signed)
   [facility] [address] [place], [place]  [address] Phone: [phone]      May 24, 2010   Jennifer Frank [address] [place], [place] [address]  RE:  LAB RESULTS  Dear  [name],  The following is an interpretation of your most recent lab tests.  Please take note of any instructions provided or changes to medications that have resulted from your lab work.  Pap Smear: normal     Good news.   Sincerely Yours,    Jacques Navy MD

## 2010-06-21 NOTE — Assessment & Plan Note (Signed)
Summary: Cpx/pap smear/Bcbs/ #/cd   Vital Signs:  Patient profile:   60 year old female Height:      71 inches Weight:      181 pounds BMI:     25.34 O2 Sat:      98 % on Room air Temp:     98.5 degrees F oral Pulse rate:   68 / minute BP sitting:   110 / 72  (left arm) Cuff size:   regular  Vitals Entered By: Bill Salinas CMA (May 18, 2010 2:10 PM)  O2 Flow:  Room air CC: cpx/ ab  Vision Screening:      Vision Comments: Last eye exam was Dec 2009 pt had slight changed in lens prescriptions   Primary Care Keyleen Cerrato:  Norins  CC:  cpx/ ab.  History of Present Illness: Patient presents for a well woman exam. She has been doing well and feeling healthy. She is determined to loose weight and excess mid-abdominal girth. Last hospitalization was in June '11 for abdominal pain with a negative work -up. she has not had recurrent pain or problems.   Current Medications (verified): 1)  Femhrt Low Dose 0.5-2.5 Mg-Mcg  Tabs (Norethindrone-Eth Estradiol) .... Once Daily 2)  Hyoscyamine Sulfate 0.125 Mg  Subl (Hyoscyamine Sulfate) .Marland Kitchen.. 1-2 Every 4 Hrs As Needed For Abdominal or Rectal Pain 3)  Protonix 40 Mg  Tbec (Pantoprazole Sodium) .Marland Kitchen.. 1 Once Daily 4)  Caltrate 600+d 600-400 Mg-Unit  Tabs (Calcium Carbonate-Vitamin D) .Marland Kitchen.. 1 Once Daily 5)  Imitrex 100 Mg Tabs (Sumatriptan Succinate) .Marland Kitchen.. 1 As Needed Migraine, May Repeat in 2 Hours If Needed 6)  Crestor 10 Mg  Tabs (Rosuvastatin Calcium) .... Take 1 Tablet By Mouth Once A Day 7)  Womens Multivitamin Plus   Tabs (Multiple Vitamins-Minerals) .Marland Kitchen.. 1 Tab Daily 8)  Meclizine Hcl 25 Mg Tabs (Meclizine Hcl) .... 1/2 or 1 Tablet Every 6 Hours As Needed  Allergies (verified): No Known Drug Allergies  Past History:  Past Medical History: Last updated: 05/02/2008 Anxiety Hx Peptic Ulcer Disease Atypical Chest Pain Migraines Syncope Hyperlipidemia GERD     Past Surgical History: Last updated: 10/10/2007 ARTHROSCOPY, HX OF  LEFT HIP (ICD-V45.4) ARTHROSCOPY, LEFT KNEE, HX OF (ICD-V45.89) TONSILLECTOMY, HX OF (ICD-V45.79) HIP REPLACEMENT, BILATERAL, HX OF (ICD-V43.64)  Family History: Last updated: 08/11/2007 father-'27: CAD/MI, HTN, Lipids mother - '22: Alzheimer's-bedbound; h/o DVTs 2nd degree kin with CAD Neg-DM, breast cancer Brother - colon cancer.  Social History: Last updated: 01/16/2009 HSG married '75 2 sons - '78, '84 retired - laid off. Takes care of parents. Brother died of metatstatic colon cancer Sept '09  Risk Factors: Smoking Status: never (02/16/2007)  Review of Systems  The patient denies anorexia, fever, weight loss, weight gain, vision loss, decreased hearing, chest pain, syncope, dyspnea on exertion, peripheral edema, headaches, abdominal pain, severe indigestion/heartburn, incontinence, muscle weakness, suspicious skin lesions, difficulty walking, depression, abnormal bleeding, enlarged lymph nodes, and angioedema.    Physical Exam  General:  alert, well-developed, and well-nourished moderately overweight white woman in no distress.   Head:  Normocephalic and atraumatic without obvious abnormalities. No apparent alopecia or balding. Eyes:  No corneal or conjunctival inflammation noted. EOMI. Perrla. Funduscopic exam benign, without hemorrhages, exudates or papilledema. Vision grossly normal. Ears:  External ear exam shows no significant lesions or deformities.  Otoscopic examination reveals clear canals, tympanic membranes are intact bilaterally without bulging, retraction, inflammation or discharge. Hearing is grossly normal bilaterally. Nose:  no external deformity, no external erythema,  no nasal discharge, and no airflow obstruction.   Mouth:  Oral mucosa and oropharynx without lesions or exudates.  Teeth in good repair. Neck:  supple, full ROM, no masses, no thyromegaly, and no carotid bruits.   Chest Wall:  No deformities, masses, or tenderness noted. Breasts:  No mass,  nodules, thickening, tenderness, bulging, retraction, inflamation, nipple discharge or skin changes noted.   Lungs:  Normal respiratory effort, chest expands symmetrically. Lungs are clear to auscultation, no crackles or wheezes. Heart:  Normal rate and regular rhythm. S1 and S2 normal without gallop, murmur, click, rub or other extra sounds. Abdomen:  soft, non-tender, normal bowel sounds, no guarding, no rigidity, no abdominal hernia, and no hepatomegaly.   Genitalia:  Pelvic Exam:        External: normal female genitalia without lesions or masses        Vagina: normal without lesions or masses        Cervix: normal without lesions or masses        Pap smear: performed Msk:  normal ROM, no joint tenderness, no joint swelling, no joint warmth, and no joint deformities.   Pulses:  R and L carotid,radial,femoral,dorsalis pedis and posterior tibial pulses are full and equal bilaterally Extremities:  No clubbing, cyanosis, edema, or deformity noted with normal full range of motion of all joints.   Neurologic:  alert & oriented X3, cranial nerves II-XII intact, strength normal in all extremities, gait normal, and DTRs symmetrical and normal.   Skin:  turgor normal, color normal, no suspicious lesions, and no ulcerations.   Cervical Nodes:  no anterior cervical adenopathy and no posterior cervical adenopathy.   Axillary Nodes:  no R axillary adenopathy and no L axillary adenopathy.   Psych:  Oriented X3, memory intact for recent and remote, normally interactive, and good eye contact.     Impression & Recommendations:  Problem # 1:  IRRITABLE BOWEL SYNDROME (ICD-564.1) She has been relatively stable since her hospitalization in June.  Plan- hyoscamine as needed.  Problem # 2:  ANXIETY (ICD-300.00) She is doing well without overt signs of increased anxiety  Problem # 3:  MIGRAINE HEADACHE (ICD-346.90) Stable with no c/o recent headache. She does get relief with imitrex as needed.  The  following medications were removed from the medication list:    Celebrex 100 Mg Caps (Celecoxib) .Marland Kitchen..Marland Kitchen Two times a day Her updated medication list for this problem includes:    Imitrex 100 Mg Tabs (Sumatriptan succinate) .Marland Kitchen... 1 as needed migraine, may repeat in 2 hours if needed  Problem # 4:  HYPERLIPIDEMIA (ICD-272.4)  Her updated medication list for this problem includes:    Crestor 10 Mg Tabs (Rosuvastatin calcium) .Marland Kitchen... Take 1 tablet by mouth once a day  Labs Reviewed: SGOT: 20 (05/11/2010)   SGPT: 16 (05/11/2010)   HDL:71.00 (05/11/2010), 57.20 (01/10/2009)  LDL:97 (05/11/2010), 88 (01/10/2009)  Chol:180 (05/11/2010), 157 (01/10/2009)  Trig:60.0 (05/11/2010), 59.0 (01/10/2009)  Excellent control with crestor: well tolerated and effective.  Problem # 5:  Preventive Health Care (ICD-V70.0) History since June is unremarkable. Physical exam is normal. Lab results are within normal limits. Current with mammography - Dec '11. Current with colorectal cancer screening with last exam Nov ' 09. Immunizations: Tetnu Aug '10; Flu Oct '11. 12 lead EKG withou evidence of injury or ischemia.  Weight management: patient is determined to loos weight. Discussed strategy of smart food choices; PORTION SIZE CONTROL and elimination of wasted calories; regular aerobic exercise. Target weight 175 lbs (  BMI 24) with a goal of loosing 1-2lbs/month. She should also include abdominal toning exercise to her workout.  In summary - a very nice woman who appears to be medically stable and current for prevention.   Complete Medication List: 1)  Femhrt Low Dose 0.5-2.5 Mg-mcg Tabs (Norethindrone-eth estradiol) .... Once daily 2)  Hyoscyamine Sulfate 0.125 Mg Subl (Hyoscyamine sulfate) .Marland Kitchen.. 1-2 every 4 hrs as needed for abdominal or rectal pain 3)  Protonix 40 Mg Tbec (Pantoprazole sodium) .Marland Kitchen.. 1 once daily 4)  Caltrate 600+d 600-400 Mg-unit Tabs (Calcium carbonate-vitamin d) .Marland Kitchen.. 1 once daily 5)  Imitrex 100 Mg  Tabs (Sumatriptan succinate) .Marland Kitchen.. 1 as needed migraine, may repeat in 2 hours if needed 6)  Crestor 10 Mg Tabs (Rosuvastatin calcium) .... Take 1 tablet by mouth once a day 7)  Womens Multivitamin Plus Tabs (Multiple vitamins-minerals) .Marland Kitchen.. 1 tab daily 8)  Meclizine Hcl 25 Mg Tabs (Meclizine hcl) .... 1/2 or 1 tablet every 6 hours as needed  Other Orders: EKG w/ Interpretation (93000)  Patient: Jennifer Frank Note: All result statuses are Final unless otherwise noted.  Tests: (1) Lipid Panel (LIPID)   Cholesterol               180 mg/dL                   1-610     ATP III Classification            Desirable:  < 200 mg/dL                    Borderline High:  200 - 239 mg/dL               High:  > = 240 mg/dL   Triglycerides             60.0 mg/dL                  9.6-045.4     Normal:  <150 mg/dL     Borderline High:  098 - 199 mg/dL   HDL                       11.91 mg/dL                 >47.82   VLDL Cholesterol          12.0 mg/dL                  9.5-62.1   LDL Cholesterol           97 mg/dL                    3-08  CHO/HDL Ratio:  CHD Risk                             3                    Men          Women     1/2 Average Risk     3.4          3.3     Average Risk          5.0          4.4     2X Average Risk          9.6  7.1     3X Average Risk          15.0          11.0                           Tests: (2) BMP (METABOL)   Sodium                    138 mEq/L                   135-145   Potassium                 4.2 mEq/L                   3.5-5.1   Chloride                  104 mEq/L                   96-112   Carbon Dioxide            28 mEq/L                    19-32   Glucose                   90 mg/dL                    04-54   BUN                       15 mg/dL                    0-98   Creatinine                0.8 mg/dL                   1.1-9.1   Calcium                   9.2 mg/dL                   4.7-82.9   GFR                       73.65 mL/min                 >60.00  Tests: (3) CBC Platelet w/Diff (CBCD)   White Cell Count          6.4 K/uL                    4.5-10.5   Red Cell Count            4.43 Mil/uL                 3.87-5.11   Hemoglobin                12.8 g/dL                   56.2-13.0   Hematocrit                37.6 %                      36.0-46.0   MCV  84.8 fl                     78.0-100.0   MCHC                      34.1 g/dL                   16.1-09.6   RDW                       13.1 %                      11.5-14.6   Platelet Count            259.0 K/uL                  150.0-400.0   Neutrophil %              63.5 %                      43.0-77.0   Lymphocyte %              26.8 %                      12.0-46.0   Monocyte %                7.7 %                       3.0-12.0   Eosinophils%              1.5 %                       0.0-5.0   Basophils %               0.5 %                       0.0-3.0   Neutrophill Absolute      4.1 K/uL                    1.4-7.7   Lymphocyte Absolute       1.7 K/uL                    0.7-4.0   Monocyte Absolute         0.5 K/uL                    0.1-1.0  Eosinophils, Absolute                             0.1 K/uL                    0.0-0.7   Basophils Absolute        0.0 K/uL                    0.0-0.1  Tests: (4) Hepatic/Liver Function Panel (HEPATIC)   Total Bilirubin           0.3 mg/dL                   0.4-5.4   Direct Bilirubin  0.0 mg/dL                   1.6-1.0   Alkaline Phosphatase      82 U/L                      39-117   AST                       20 U/L                      0-37   ALT                       16 U/L                      0-35   Total Protein             6.9 g/dL                    9.6-0.4   Albumin                   3.7 g/dL                    5.4-0.9  Tests: (5) TSH (TSH)   FastTSH                   1.24 uIU/mL                 0.35-5.50  Tests: (6) UDip w/Micro (URINE)   Color                     LT. YELLOW        RANGE:  Yellow;Lt. Yellow   Clarity                   CLEAR                       Clear   Specific Gravity          1.025                       1.000 - 1.030   Urine Ph                  5.5                         5.0-8.0   Protein                   NEGATIVE                    Negative   Urine Glucose             NEGATIVE                    Negative   Ketones                   NEGATIVE                    Negative   Urine Bilirubin           NEGATIVE  Negative   Blood                     TRACE-INTACT                Negative   Urobilinogen              0.2                         0.0 - 1.0   Leukocyte Esterace        SMALL                       Negative   Nitrite                   NEGATIVE                    Negative   Urine WBC                 7-10/hpf                    0-2/hpf   Urine RBC                 0-2/hpf                     0-2/hpf   Urine Mucus               Presence of                 None   Urine Epith               Rare(0-4/hpf)               Rare(0-4/hpf)   Urine Bacteria            Rare(<10/hpf)               NonePrescriptions: MECLIZINE HCL 25 MG TABS (MECLIZINE HCL) 1/2 or 1 tablet every 6 hours as needed  #30 x 2   Entered and Authorized by:   Jacques Navy MD   Signed by:   Jacques Navy MD on 05/18/2010   Method used:   Electronically to        [facility] (retail)       [address]       [place], [place]  [id number]       Ph: [phone]       Fax: [phone]   RxID:   [id number] IMITREX 100 MG TABS (SUMATRIPTAN SUCCINATE) 1 as needed migraine, may repeat in 2 hours if needed  #6 x 12   Entered and Authorized by:   Jacques Navy MD   Signed by:   Jacques Navy MD on 05/18/2010   Method used:   Electronically to        [facility] (retail)       [address]       [place], [place]  [id number]       Ph: [phone]       Fax: [phone]   RxID:    [id number] HYOSCYAMINE SULFATE 0.125 MG  SUBL (HYOSCYAMINE SULFATE) 1-2 every 4 hrs as needed for abdominal or rectal pain  #90 x 12   Entered and Authorized by:   Jacques Navy MD  Signed by:   Jacques Navy MD on 05/18/2010   Method used:   Electronically to        [facility] (retail)       [address]       [place], [place]  [id number]       Ph: [phone]       Fax: [phone]   RxID:   [id number]    Orders Added: 1)  EKG w/ Interpretation [93000] 2)  Est. Patient 40-64 years [99396]   Immunization History:  Influenza Immunization History:    Influenza:  fluvax 3+ (02/21/2010)   Immunization History:  Influenza Immunization History:    Influenza:  Fluvax 3+ (02/21/2010)    Preventive Care Screening  Mammogram:    Date:  04/19/2010    Results:  normal

## 2010-08-06 LAB — LIPASE, BLOOD: Lipase: 33 U/L (ref 11–59)

## 2010-08-06 LAB — COMPREHENSIVE METABOLIC PANEL
ALT: 13 U/L (ref 0–35)
AST: 16 U/L (ref 0–37)
BUN: 10 mg/dL (ref 6–23)
CO2: 27 mEq/L (ref 19–32)
Calcium: 9.3 mg/dL (ref 8.4–10.5)
Creatinine, Ser: 0.74 mg/dL (ref 0.4–1.2)
GFR calc Af Amer: >60 mL/min (ref >60)
Potassium: 4 mEq/L (ref 3.5–5.1)
Total Bilirubin: 0.4 mg/dL (ref 0.3–1.2)

## 2010-08-06 LAB — DIFFERENTIAL
Basophils Absolute: 0 10*3/uL (ref 0.0–0.1)
Basophils Relative: 0 % (ref 0–1)
Lymphocytes Relative: 9 % — ABNORMAL LOW (ref 12–46)
Lymphs Abs: 1.1 10*3/uL (ref 0.7–4.0)
Monocytes Relative: 5 % (ref 3–12)

## 2010-08-06 LAB — CBC
Hemoglobin: 13.4 g/dL (ref 12.0–15.0)
MCHC: 33.6 g/dL (ref 30.0–36.0)
RDW: 12.9 % (ref 11.5–15.5)
WBC: 12.9 10*3/uL — ABNORMAL HIGH (ref 4.0–10.5)

## 2010-08-06 LAB — CULTURE, BLOOD (ROUTINE X 2)

## 2010-08-06 LAB — AMYLASE: Amylase: 71 U/L (ref 0–105)

## 2010-09-04 LAB — URINALYSIS, ROUTINE W REFLEX MICROSCOPIC
Glucose, UA: NEGATIVE mg/dL
Hgb urine dipstick: NEGATIVE
Protein, ur: NEGATIVE mg/dL

## 2010-09-04 LAB — DIFFERENTIAL
Lymphocytes Relative: 27 % (ref 12–46)
Lymphs Abs: 1.7 10*3/uL (ref 0.7–4.0)
Monocytes Relative: 8 % (ref 3–12)
Neutro Abs: 4.2 10*3/uL (ref 1.7–7.7)
Neutrophils Relative %: 63 % (ref 43–77)

## 2010-09-04 LAB — URINE MICROSCOPIC-ADD ON

## 2010-09-04 LAB — HEPATIC FUNCTION PANEL
ALT: 14 U/L (ref 0–35)
AST: 18 U/L (ref 0–37)
Albumin: 3.8 g/dL (ref 3.5–5.2)
Alkaline Phosphatase: 57 U/L (ref 39–117)
Total Bilirubin: 0.7 mg/dL (ref 0.3–1.2)

## 2010-09-04 LAB — BASIC METABOLIC PANEL
BUN: 10 mg/dL (ref 6–23)
CO2: 23 mEq/L (ref 19–32)
Chloride: 105 mEq/L (ref 96–112)
Creatinine, Ser: 0.75 mg/dL (ref 0.4–1.2)
GFR calc Af Amer: >60 mL/min (ref >60)
Glucose, Bld: 83 mg/dL (ref 70–99)

## 2010-09-04 LAB — CBC
MCHC: 34.4 g/dL (ref 30.0–36.0)
Platelets: 231 10*3/uL (ref 150–400)

## 2010-10-02 NOTE — Assessment & Plan Note (Signed)
[facility]                         GASTROENTEROLOGY OFFICE NOTE   NAME:Jennifer Frank                     MRN:          [id number]  DATE:10/15/2006                            DOB:          Apr 13, 1951    Jennifer Frank comes in saying she is doing well.  She was upset at my  retirement.  Said she has a few questions.  Says she is taking the  Protonix in the morning regularly and at bedtime.  She says when she  stops these medications, she starts having symptoms.  I told her that I  thought it had been over 15 years since her last endoscopy, and I  thought this should be repeated because of the chronicity of her  symptoms and just to make sure Barrett's is not developing or has  already developed, and the extent of her disease process, it has been so  long since she has been on medicines.  The other question is that she  has a very strong family history for cancer, colon cancer as well, and I  think that she needs to have a repeat colonoscopic examination sometime  within the next year, and I would do both procedures at the same time.   PHYSICAL EXAMINATION:  VITAL SIGNS:  Her weight was 185.  Blood pressure  124/76.  Pulse 68 and regular.  NECK:  Unremarkable.  HEART:  Unremarkable.  EXTREMITIES:  Unremarkable.   IMPRESSION:  1. Gastroesophageal reflux disease:  Symptoms controlled with      medication.  2. History of total hip replacement and knee surgery.  3. History of peptic ulcer disease.   My recommendation is that she continue on the same medicines.  She is to  use some hyoscyamine at times p.r.n.  She also has hyperlipidemia.  I  told her that she should follow up with Dr. Leone Payor some time in the  near future for the above studies at his discretion.     Ulyess Mort, MD  Electronically Signed    SML/MedQ  DD: 10/15/2006  DT: 10/16/2006  Job #: [id number]

## 2010-10-02 NOTE — Assessment & Plan Note (Signed)
[facility]                         GASTROENTEROLOGY OFFICE NOTE   NAME:Lindblad, MIKEYA TOMASETTI                     MRN:          [id number]  DATE:08/06/2007                            DOB:          [birth year]    CHIEF COMPLAINT:  To schedule colonoscopy and endoscopy.   HISTORY:  Ms. Ranganathan is a 60 year old white woman Dr. Corinda Gubler used to  follow because of GERD and family history of colon cancer.  I had just  sent her a recall letter, but before that came she had scheduled an  appointment to meet me and to set up her colonoscopy which is due in May  because of a family history of colon cancer.  Her brother had 2 colon  cancers and is now struggling with metastatic disease.  He lives in  [place].  She has chronic GERD and it has been 15 or more years since  she has had an EGD, and Dr. Corinda Gubler had suggested at some point she have  a study of the esophagus to exclude Barrett's esophagus.  She relates  that she has been having some chest pain problems, fleeting, sharp chest  pains radiating through to the back, but also had some left shoulder  pain one day with nausea and lightheadedness.  She may have even had a  syncopal episode a while back.  She feels fine right now.  There is no  dysphagia, there is no shortness of breath at this time.  She is not  having any bowel habit changes, no vomiting.  She has an appointment  scheduled to see Dr. Debby Bud in May for a physical she scheduled in  January, but these symptoms started after that.  She has had some chest  pain before, but on the right side last year.  She had a gallbladder  workup with ultrasound and HIDA scan that were negative.  Apparently, a  number of years ago, she had a cardiac workup as well, I am told, when  she was going out to [facility]  She did have a treadmill stress  Cardiolite study in August 2004, that was negative.   MEDICATIONS:  Are listed and reviewed in the chart, including  hyoscyamine p.r.n., metoclopramide 10 mg daily, Protonix (generic) 40 mg  daily, Femhrt 0.5 mg at bedtime, calcium and vitamin D b.i.d., Celebrex  100 mg b.i.d., Crestor 5 mg daily, One A Day vitamin, she is on Zomig 5  mg p.r.n.   PAST MEDICAL HISTORY:  1. Gastroesophageal reflux disease.  2. Colonoscopy May 2004 for family history showed to be normal.  3. Atypical chest pain.  4. Dyslipidemia.  5. Migraine headaches.  6. Bilateral total hip replacements, 1998 and 2003.   PHYSICAL EXAMINATION:  Shows a well-developed, well-nourished white  woman.  Weight 191 pounds, pulse 74, blood pressure 102/70, height 5 feet 10  inches.  Cheeks are somewhat erythematous.  LUNGS:  Clear.  HEART:  S1, S2, no rubs, murmurs, or gallops.  There is no jugular venous distension.  Chest wall is nontender, the left shoulder is nontender.  The abdomen is soft and nontender without  organomegaly or mass.  No peripheral edema.   ASSESSMENT:  1. Atypical chest pain.  2. Gastroesophageal reflux disease, question if related to #1, though      probably not.  3. Family history of colon cancer.   PLAN:  We have gotten her a sooner appointment with Dr. Debby Bud to  evaluate this ongoing atypical chest pain.  She is not having it right  now, but I think an internal medicine evaluation and any other workup he  decides would be appropriate prior to scheduling and  esophagogastroduodenoscopy and colonoscopy.  Once she has had that done  and significant coronary issues are ruled out, she can call back and go  through pre visits to set up her esophagogastroduodenoscopy and  colonoscopy.  Esophagogastroduodenoscopy to evaluate chronic  gastroesophageal reflux disease and screen for Barrett's, and the  colonoscopy because of the family history of colon cancer.     Iva Boop, MD,FACG  Electronically Signed    CEG/MedQ  DD: 08/06/2007  DT: 08/06/2007  Job #: [id number]   cc:   Rosalyn Gess. Norins, MD

## 2010-10-02 NOTE — Assessment & Plan Note (Signed)
[facility]                                 ON-CALL NOTE   NAME:Jennifer Frank, HAVYN RAMO                     MRN:          [id number]  DATE:07/02/2008                            DOB:          06/12/1950    PRIMARY CARE PHYSICIAN:  Rosalyn Gess. Norins, MD   SUBJECTIVE:  A 24-48 hours of nausea and vomiting, limited liquids  tolerated.  She feels fatigued and feels dehydrated.  Her husband had  this earlier in the week and was admitted to the hospital.  She states  she is also having diarrhea.  She has some Phenergan suppositories at  home, but cannot keep this in either because of the frequent diarrhea.   ASSESSMENT AND PLAN:  Viral gastroenteritis, likely dehydration.  Recommended evaluation at the emergency room for likely IV fluids.     Kerby Nora, MD  Electronically Signed    AB/MedQ  DD: 07/02/2008  DT: 07/02/2008  Job #: [id number]

## 2010-10-02 NOTE — Letter (Signed)
August 11, 2007    Rosalyn Gess. Norins, MD  [address]  [place], [place] [address]   RE:  JISSELLE, POTH  MRN:  [id number]  /  DOB:  1950-05-29   Dear Casimiro Needle:   It was a pleasure to see Moya Duan today at your request because of  her symptoms of chest pain as well as syncope.   As you know, she is a 60 year old woman with a family history of Crohn's  disease and colon cancer who was referred for you for evaluation by Stan Head from the GI division because of complaints of chest pain and  syncope.   I saw her earlier today and described symptoms probably similar to me as  she had to you.  Specifically, there are three symptom complexes.  The  first was a left arm aching which has been going on and off now for  months.  It was occasionally associated with a stabbing chest pain going  from front to back, lasting just seconds.  Her hand on the left side she  feels is cooler than that on the right. She also feels that there is  particularly numbness in the fifth digit of the left hand.   The more concerning symptoms from a cardiac point of view are the  episodes of syncope and presyncope when she puts her arms above her  head.  This is accompanied by nausea.  It can be provoked by activities  such as putting dishes away or working overhead in other ways. She does  not notice it wearing tight collars.  She does not notice it with  turning of her head.  It was your impression that she had a diminution  of pulse with left shoulder extension.   Her other concern is of exercise-associated and nocturnal shortness of  breath.  This has been accompanied by some vague fullness in her chest  but unassociated with palpitations.  These episodes are relieved by rest  and are clearly worse now than they were 3-6 months ago.  She has also  problems with peripheral edema, but this is longer standing.  Nocturnal  dyspnea on a couple of occasions has taken about 30 minutes to abate.   I  failed to ask her if she has had prior exposure to lung toxins.   Her past cardiac evaluation included a stress test some years ago which  she was told was normal, and indeed from 2004, Dr. Myrtis Ser' report confirms  the above.   PAST MEDICAL HISTORY:  In addition to above, is notable for:  1. Anemia.  2. Arthritis.  3. GE reflux disease.   PAST SURGICAL HISTORY:  Is notable for:  1. Hip replacements, left and right.  2. Arthroscopic surgery.   SOCIAL HISTORY:  She is married.  She has two children.  She does not  use cigarettes, alcohol or recreational drugs.  She does use caffeine  regularly.  She is not fit, but she is still quite active.   MEDICATIONS:  Include metoclopramide, Protonix, Crestor, Celebrex,and  vitamins.   ALLERGIES:  No known drug allergies, but she does get nausea with pain  medications to takes Phenergan.   PHYSICAL EXAMINATION:  GENERAL:  She is an older Caucasian female  appearing her stated age of 66.  VITAL SIGNS:  Her up blood pressure was 146/90 with a pulse of 67 lying;  sitting it was 159/80 with a pulse of 67; standing at zero minutes, it  was  149/80 with pulse of 71; at 2 minutes, it was 148/90 with a pulse of  74; and at 5 minutes was 135/80 with a pulse 74.  HEENT:  Exam demonstrated no icterus or xanthoma.  NECK:  The neck veins were flat.  The carotids are brisk and full  bilaterally out bruits.  BACK:  Without kyphosis or scoliosis.  LUNGS:  Clear.  HEART:  Heart sounds were regular without murmurs or gallops.  ABDOMEN:  Soft with active bowel sounds without midline pulsation or  hepatomegaly.  EXTREMITIES:  Femoral pulses were 2+.  Distal pulses were intact.  There  is no clubbing, cyanosis or edema.  NEUROLOGIC:  Exam was grossly normal.  SKIN:  Warm and dry.   I undertook a variety of maneuvers as you had. I was unable to  appreciate a diminution in her pulse with extension of either shoulder.  Carotid sinus massage was unassociated  with bradycardia. Standing and  putting her arms above her head was accompanied by some dizziness but  without bradycardia.   IMPRESSION:  1. Exertional chest discomfort, progressive, subacute.  2. Atypical chest pains unassociated with exertion for the most part.  3. History of hip surgery bilaterally.  4. Orthostatic intolerance.  5. Complaints of tingling in her left hand and sensation that her left      arm is cooler than her right arm.   DISCUSSION:  Casimiro Needle, Mrs. Papania has a series of complaints, some of  which point to a possible cardiovascular cause.  She clearly has some  orthostatic intolerance, and I am not quite sure why this is the case.  She has learned to modify her behaviors to mitigate against this, and I  think we will need to continue to watch this and see how it progresses.  The symptoms of dizziness when she puts her arm above her head are not  dissimilar from the symptoms associated with abrupt changes in position;  they are accompanied by some nausea.  Vascular evaluation of her upper  extremities I think is a good idea, and I look forward to seeing the  results of those tests that you have ordered.  The fact that we are able  to reproduce some of this without change in heart rhythm here in the  office prompted me to suggest to her that maybe the event recorder is  not so useful at the current time.   She is largely bothered by her shortness of breath.  The possibility of  a cardiac cause for this either in terms of diastolic or potentially new  systolic dysfunction I think needs to be explored, and furthermore,  could it be an anginal equivalent.  To that end, I have ordered stress  testing as well as an echocardiogram.  The other thought that occurs to  me as I sit and dictate is that, in the context of prior hip surgeries,  although they are somewhat remote, is there any chance that she is at  risk for chronic pulmonary thromboembolic disease.  Her   electrocardiogram has a hint of an S1-Q3-T3 pattern on it, and I think  we just need to keep this in mind, particularly if anything shows up on  the echocardiogram in terms of the right heart.  This would be worth  pursuing.   As relates to her arm discomfort, I wonder whether this is a cervical  radiculopathy; I know that there are a lot of diagnoses here, but to my  neurological  exam, albeit rather cursory, there was some hyperesthesia  on the left compared to the right.   RECOMMENDATIONS:  Based on the above, therefore, I have:  1. Scheduled her for 2-D echocardiogram.  2. Scheduled her for a Myoview.  3. Await your results of the upper extremity arterial Doppler      ultrasound.  4. Consider referral to neurology-neurosurgery.  She has had prior      family contact with Dr.      Trey Sailors, and I will leave a message on his voice mail as to      whether this is worth pursuing.   We will get back with her with the aforementioned testing results.    Sincerely,      Duke Salvia, MD, [facility]  Electronically Signed    SCK/MedQ  DD: 08/11/2007  DT: 08/11/2007  Job #: [id number]

## 2010-10-05 NOTE — Assessment & Plan Note (Signed)
[facility]                           PRIMARY CARE OFFICE NOTE   NAME:Figler, DOMINIGUE GELLNER                     MRN:          [id number]  DATE:06/12/2006                            DOB:          03-25-1951    [name] is a delightful 60 year old woman last seen in the office  May 07, 2005.   INTERVAL HISTORY:  The patient has been doing well. Last week however  she did develop a sore throat which progressed to a significant cough  that progressed to chest tightness and mild shortness of breath. She  continues to cough. She feels somewhat smothered at night. She has had  some myalgias. She has been taking Claritin and Tylenol with some relief  of symptoms.   PAST MEDICAL HISTORY, FAMILY HISTORY, SOCIAL HISTORY:  Are well-  documented in my notes of May 07, 2005, and April 20, 2004.   CURRENT MEDICATIONS:  1. Fem HRT 0.5 mg daily.  2. Hyoscyamine p.r.n.  3. Metoclopramide 10 mg nightly.  4. Protonix 40 mg daily.  5. Celebrex 200 mg nightly.  6. Calcium with vitamin D.  7. Zomig 5 mg for headache.   REVIEW OF SYSTEMS:  Is negative for any constitutional problems. She has  had an eye examination in the last 12 months. No ENT complaints. The  patient has had one episode of chest discomfort, which was minor in  nature, sharp with some radiation to her left arm. It was non-  exertional. There was no diaphoresis or shortness of breath. No  respiratory problems except for dyspnea and air hunger as mentioned  above, but no wheezing is reported. No GI, GU or musculoskeletal,  dermatologic problems.   PHYSICAL EXAMINATION:  Temperature was 97.4, blood pressure 119/73,  pulse 71, weight 181.4.  GENERAL APPEARANCE: This is a heavy set, well-nourished woman in no  acute distress.  HEENT: Normocephalic, atraumatic. EACs with cerumen on the left blocking  the tympanic membrane. After irrigation it still remained somewhat  abnormal in appearance  with a question of wax that was packed up against  the drum versus an abnormal eardrum. She did have decreased hearing on  the left versus the right to both tuning fork and finger rub. Oropharynx  with native dentition in good repair. No buccal or palatale lesions were  noted. Posterior pharynx was clear. Conjunctivae and sclerae were clear.  Pupils equal, round and reactive to light and accommodation. Funduscopic  examination reveals normal disc margins with no vascular abnormalities.  NECK: was supple without thyromegaly.  NODES: No adenopathy was noted in the cervical or supraclavicular  regions.  CHEST: No costovertebral angle tenderness.  LUNGS:  Clear to auscultation and percussion.  CARDIOVASCULAR: 2+ radial pulses. No JVD or carotid bruits. She had a  quiet precordium with regular rate and rhythm without murmurs, rubs or  gallops.  BREASTS: Deferred to gynecology.  ABDOMEN: Soft. No guarding or rebound. No organosplenomegaly was  appreciated.  PELVIC/RECTAL: Examinations were deferred to gynecology.  EXTREMITIES: Without clubbing, cyanosis, edema or deformity.  NEUROLOGIC: Is  nonfocal.  SKIN: Was clear.   LABORATORY  DATA:  Hemoglobin was 13.2 grams, white count 6400 with a  normal differential. Cholesterol was 224, triglycerides 77, HDL was  62.1, LDL was 143.8. Electrolytes were unremarkable.  Serum glucose 99.  Kidney function normal with a creatinine of 0.8, GFR of 79 milliliters  per minute. Liver functions were normal. TSH was normal at 1.27.  Urinalysis was unremarkable.   ASSESSMENT/PLAN:  1. Gastroesophageal reflux disease. The patient is well-controlled on      her present regimen of Protonix, metoclopramide and hyoscyamine.  2. MSK. The patient is status post bilateral hip replacements. She is      doing well Celebrex, but prefers 100 mg twice daily rather than 200      mg daily and this change is made for her.  3. Lipids. The patient has mildly elevated lipids.  This is slightly      down from last year when she was 152. The patient does have a      positive family history for heart disease and she is interested in      prevention. Plan: The patient is started on Crestor 5 mg daily. She      is to return in 3 weeks for a lipid panel, ALT/AST with a goal of      an LDL of less than 130 and close to 100 as reasonable.  4. Migraine. The patient is doing well. She does use Zomig on a p.r.n.      basis.  5. Respiratory. The patient's examination is unremarkable. Her lungs      were clear. She has no wheezing. She has normal O2 saturation. I      suspect that she is at the end of a viral illness. Plan: No      intervention at this time except for continue with Claritin and      Tylenol.  6. Health maintenance. The patient is current and up-to-date with her      gynecologist, Dr. Elana Alm, with her last pelvic and pap in December      of 2007, last mammogram December of 2007, which was normal.  7. Current with colorectal cancer screening with last colonoscopy May      25. 2004, with recall in 5 years.   In summary, this is a very pleasant woman recovering from a viral  respiratory infection who otherwise is medically stable and doing well.  She is asked to return to see me on a p.r.n. basis.  She will return in approximately 3 weeks for a followup lipid panel to  monitor response to medication. She will be notified by phone triage of  her results.     Rosalyn Gess Norins, MD  Electronically Signed    MEN/MedQ  DD: 06/12/2006  DT: 06/12/2006  Job #: [id number]   cc:   Ms. Johnnye Lana. Kyra Manges, M.D.

## 2011-04-26 ENCOUNTER — Other Ambulatory Visit: Payer: Self-pay | Admitting: Internal Medicine

## 2011-05-06 ENCOUNTER — Other Ambulatory Visit: Payer: Self-pay | Admitting: Internal Medicine

## 2011-05-20 ENCOUNTER — Telehealth: Payer: Self-pay | Admitting: *Deleted

## 2011-05-20 NOTE — Telephone Encounter (Signed)
Called pt to inform her that she needs to call [phone], called the [phone] and they said that pt needed to call number provided that when she signed up for this program she did not provide credible coverage. i called pts home number and gave her husband the information for pt to call.

## 2011-07-07 ENCOUNTER — Ambulatory Visit (INDEPENDENT_AMBULATORY_CARE_PROVIDER_SITE_OTHER): Payer: PRIVATE HEALTH INSURANCE | Admitting: Family Medicine

## 2011-07-07 VITALS — BP 157/92 | HR 89 | Temp 98.9°F | Resp 16 | Ht 69.25 in | Wt 198.0 lb

## 2011-07-07 DIAGNOSIS — E785 Hyperlipidemia, unspecified: Secondary | ICD-10-CM

## 2011-07-07 DIAGNOSIS — J9801 Acute bronchospasm: Secondary | ICD-10-CM

## 2011-07-07 DIAGNOSIS — J069 Acute upper respiratory infection, unspecified: Secondary | ICD-10-CM

## 2011-07-07 DIAGNOSIS — J45909 Unspecified asthma, uncomplicated: Secondary | ICD-10-CM

## 2011-07-07 DIAGNOSIS — K219 Gastro-esophageal reflux disease without esophagitis: Secondary | ICD-10-CM

## 2011-07-07 MED ORDER — ALBUTEROL SULFATE HFA 108 (90 BASE) MCG/ACT IN AERS: 1.0000 | INHALATION_SPRAY | Freq: Four times a day (QID) | RESPIRATORY_TRACT | Status: DC | PRN

## 2011-07-07 MED ORDER — AMOXICILLIN 875 MG PO TABS: 875.0000 mg | ORAL_TABLET | Freq: Two times a day (BID) | ORAL | Status: AC

## 2011-07-07 MED ORDER — PREDNISONE 20 MG PO TABS: 20.0000 mg | ORAL_TABLET | Freq: Every day | ORAL | Status: AC

## 2011-07-07 NOTE — Progress Notes (Signed)
  Subjective:    Patient ID: Jennifer Frank, female    DOB: 1950/12/21, 61 y.o.   MRN: [id number]  Cough This is a new problem. The current episode started in the past 7 days. The problem has been gradually worsening. The problem occurs hourly. The cough is non-productive. Associated symptoms include chills, ear congestion, ear pain, nasal congestion, postnasal drip and rhinorrhea (clear). Pertinent negatives include no wheezing. Sore throat: resolved Friday.  Otalgia  Associated symptoms include coughing and rhinorrhea (clear). Sore throat: resolved Friday.    Non smoker Cough worse at night Abdominal muscles sore secondary to cough  History of pneumonia in the past   Works at [employer] Review of Systems  Constitutional: Positive for chills.  HENT: Positive for ear pain, rhinorrhea (clear) and postnasal drip. Sore throat: resolved Friday.   Respiratory: Positive for cough. Negative for wheezing.        Objective:   Physical Exam  HENT:  Right Ear: Tympanic membrane is retracted.  Left Ear: Tympanic membrane is retracted.  Nose: Mucosal edema present.  Mouth/Throat: Posterior oropharyngeal erythema present. Oropharyngeal exudate: eythema.  Neck: Neck supple. No thyromegaly present.  Cardiovascular: Normal rate, regular rhythm and normal heart sounds.   Pulmonary/Chest: Decreased breath sounds: prolong expiratory phase.  Neurological: She is alert.  Skin: Skin is warm.     Peak flow 325      Assessment & Plan:   1. URI (upper respiratory infection)   2. RAD (reactive airway disease)   3. Dyslipidemia   4. GERD (gastroesophageal reflux disease)    See prescriptions on AVS Anticipatory guidance. Follow up INB or worsening symptoms

## 2011-07-07 NOTE — Progress Notes (Signed)
  Subjective:    Patient ID: Jennifer Frank, female    DOB: Oct 06, 1950, 61 y.o.   MRN: [id number]  Cough Associated symptoms include ear pain.  Otalgia  Associated symptoms include coughing.      Review of Systems  HENT: Positive for ear pain.   Respiratory: Positive for cough.        Objective:   Physical Exam        Assessment & Plan:

## 2012-01-08 ENCOUNTER — Encounter: Payer: Self-pay | Admitting: Internal Medicine

## 2012-02-26 ENCOUNTER — Ambulatory Visit (INDEPENDENT_AMBULATORY_CARE_PROVIDER_SITE_OTHER): Payer: PRIVATE HEALTH INSURANCE | Admitting: Internal Medicine

## 2012-02-26 ENCOUNTER — Other Ambulatory Visit (INDEPENDENT_AMBULATORY_CARE_PROVIDER_SITE_OTHER): Payer: PRIVATE HEALTH INSURANCE

## 2012-02-26 ENCOUNTER — Encounter: Payer: Self-pay | Admitting: Internal Medicine

## 2012-02-26 VITALS — BP 110/80 | HR 74 | Temp 97.2°F | Resp 16 | Ht 71.0 in | Wt 192.0 lb

## 2012-02-26 DIAGNOSIS — Z0000 Encounter for general adult medical examination without abnormal findings: Secondary | ICD-10-CM

## 2012-02-26 DIAGNOSIS — K589 Irritable bowel syndrome without diarrhea: Secondary | ICD-10-CM

## 2012-02-26 DIAGNOSIS — E785 Hyperlipidemia, unspecified: Secondary | ICD-10-CM

## 2012-02-26 DIAGNOSIS — K219 Gastro-esophageal reflux disease without esophagitis: Secondary | ICD-10-CM

## 2012-02-26 LAB — HEPATIC FUNCTION PANEL
AST: 16 U/L (ref 0–37)
Alkaline Phosphatase: 76 U/L (ref 39–117)
Bilirubin, Direct: 0.1 mg/dL (ref 0.0–0.3)
Total Bilirubin: 0.5 mg/dL (ref 0.3–1.2)

## 2012-02-26 LAB — COMPREHENSIVE METABOLIC PANEL
ALT: 14 U/L (ref 0–35)
CO2: 27 mEq/L (ref 19–32)
Creatinine, Ser: 0.9 mg/dL (ref 0.4–1.2)
GFR: 72.22 mL/min (ref >60.00)
Glucose, Bld: 85 mg/dL (ref 70–99)
Total Bilirubin: 0.5 mg/dL (ref 0.3–1.2)

## 2012-02-26 LAB — CBC WITH DIFFERENTIAL/PLATELET
Basophils Absolute: 0 10*3/uL (ref 0.0–0.1)
HCT: 38.6 % (ref 36.0–46.0)
Lymphs Abs: 2 10*3/uL (ref 0.7–4.0)
MCV: 86.8 fl (ref 78.0–100.0)
Monocytes Absolute: 0.5 10*3/uL (ref 0.1–1.0)
Platelets: 281 10*3/uL (ref 150.0–400.0)
RDW: 13.3 % (ref 11.5–14.6)

## 2012-02-26 LAB — LIPID PANEL
Cholesterol: 213 mg/dL — ABNORMAL HIGH (ref 0–200)
Total CHOL/HDL Ratio: 3
Triglycerides: 74 mg/dL (ref 0.0–149.0)

## 2012-02-26 LAB — LDL CHOLESTEROL, DIRECT: Direct LDL: 133 mg/dL

## 2012-02-26 NOTE — Progress Notes (Signed)
Subjective:    Patient ID: Jennifer Frank, female    DOB: [birth year], 61 y.o.   MRN: [id number]  HPI The patient is here for annual wellness examination and management of other chronic and acute problems.  In the interval she has been well. She has had halitosis but her dentist gave her a clean bill of health. She had a normal PAP in Dec '11. She has had a little increased reflux   The risk factors are reflected in the social history.  Activities of daily living:  The patient is 100% inedpendent in all ADLs: dressing, toileting, feeding as well as independent mobility  Home safety : The patient has smoke detectors in the home. They wear seatbelts.  There is no risks for hepatitis, STDs or HIV. There is no   history of blood transfusion. They have no travel history to infectious disease endemic areas of the world.  The patient has seen their dentist in the last six month. They have seen their eye doctor in the last 3 years. They deny any hearing difficulty and have not had audiologic testing in the last year.    They do not  have excessive sun exposure. Discussed the need for sun protection: hats, long sleeves and use of sunscreen if there is significant sun exposure.   Diet: the importance of a healthy diet is discussed. They do have a unhealthy-high fat/fast food diet last 6 months but when renovations are done will resume home cooking.  The patient has no regular exercise program.  The benefits of regular aerobic exercise were discussed.  Depression screen: there are no signs or vegative symptoms of depression- irritability, change in appetite, anhedonia, sadness/tearfullness.  Cognitive assessment: the patient manages all their financial and personal affairs and is actively engaged.    The following portions of the patient's history were reviewed and updated as appropriate: allergies, current medications, past family history, past medical history,  past surgical history, past  social history  and problem list.  Vision, hearing, body mass index were assessed and reviewed.   During the course of the visit the patient was educated and counseled about appropriate screening and preventive services including : fall prevention , diabetes screening, nutrition counseling, colorectal cancer screening, and recommended immunizations.   Past Medical History: Last updated: 05/02/2008 Anxiety Hx Peptic Ulcer Disease Atypical Chest Pain Migraines Syncope Hyperlipidemia GERD     Past Surgical History: Last updated: 10/10/2007 ARTHROSCOPY, HX OF LEFT HIP (ICD-V45.4) ARTHROSCOPY, LEFT KNEE, HX OF (ICD-V45.89) TONSILLECTOMY, HX OF (ICD-V45.79) HIP REPLACEMENT, BILATERAL, HX OF (ICD-V43.64)  Family History: Last updated: 08/11/2007 father-'27: CAD/MI, HTN, Lipids- expired Jan '13 mother - '22: Alzheimer's-bedbound; h/o DVTs   Expired Oct 10, '10 2nd degree kin with CAD Neg-DM, breast cancer Brother - colon cancer.  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  HEENT:  Negative for hearing loss, ear pain, congestion, neck stiffness and postnasal drip. Negative for sore throat or swallowing problems. Negative for dental complaints.   Eyes: Negative for vision loss or change in visual acuity.  Respiratory: Negative for chest tightness and wheezing. Negative for DOE.   Cardiovascular: Negative for chest pain or palpitations. No decreased exercise tolerance Gastrointestinal: No change in bowel habit. No bloating or gas. Occasional reflux or indigestion Genitourinary:  Negative for urgency, frequency, flank pain and difficulty urinating.  Musculoskeletal: Positive for myalgias, arthralgias and gait problem. More immobile the  more of a problem she has with stiffness Neurological: Negative for dizziness, tremors, weakness and headaches.  Hematological: Negative for adenopathy.  Psychiatric/Behavioral: Negative for behavioral problems and dysphoric mood.       Objective:   Physical Exam Filed Vitals:   02/26/12 1400  BP: 110/80  Pulse: 74  Temp: 97.2 F (36.2 C)  Resp: 16   Wt Readings from Last 3 Encounters:  02/26/12 192 lb (87.091 kg)  07/07/11 198 lb (89.812 kg)  05/18/10 181 lb (82.101 kg)   Gen'l: well nourished, well developed white woman in no distress HEENT - Parker/AT, EACs/TMs normal, oropharynx with native dentition in good condition, no buccal or palatal lesions, posterior pharynx clear, mucous membranes moist. C&S clear, PERRLA, fundi - normal Neck - supple, no thyromegaly Nodes- negative submental, cervical, supraclavicular regions Chest - no deformity, no CVAT Lungs - cleat without rales, wheezes. No increased work of breathing Breast - deferred to mammogram Cardiovascular - regular rate and rhythm, quiet precordium, no murmurs, rubs or gallops, 2+ radial, DP and PT pulses Abdomen - BS+ x 4, no HSM, no guarding or rebound or tenderness Pelvic - deferred  Rectal - deferred  Extremities - no clubbing, cyanosis, edema or deformity.  Neuro - A&O x 3, CN II-XII normal, motor strength normal and equal, DTRs 2+ and symmetrical biceps, radial, and patellar tendons. Cerebellar - no tremor, no rigidity, fluid movement and normal gait. Derm - Head, neck, back, abdomen and extremities without suspicious lesions  Lab Results  Component Value Date   WBC 8.2 02/26/2012   HGB 12.7 02/26/2012   HCT 38.6 02/26/2012   PLT 281.0 02/26/2012   GLUCOSE 85 02/26/2012   CHOL 213* 02/26/2012   TRIG 74.0 02/26/2012   HDL 61.80 02/26/2012   LDLDIRECT 133.0 02/26/2012   LDLCALC 97 05/11/2010        ALT 14 02/26/2012   AST 16 02/26/2012        NA 138 02/26/2012   K 4.5 02/26/2012   CL 105 02/26/2012   CREATININE 0.9  02/26/2012   BUN 11 02/26/2012   CO2 27 02/26/2012   TSH 1.24 05/11/2010           Assessment & Plan:

## 2012-02-26 NOTE — Patient Instructions (Addendum)
Thanks for coming to see me.  Exam is normal. Full report to follow including labs. You can also sign up for MyChart to access info.

## 2012-03-01 DIAGNOSIS — Z0000 Encounter for general adult medical examination without abnormal findings: Secondary | ICD-10-CM | POA: Insufficient documentation

## 2012-03-01 NOTE — Assessment & Plan Note (Signed)
LDL cholesterol is above goal of 130 b ut HDL is robust.  Plan No change in medication  Improve dietary adherence - low fat diet and increase regular exercise  Repeat Lipid panel in 6 months - if not better controlled will consider increasing Crestor to 20 mg daily

## 2012-03-01 NOTE — Assessment & Plan Note (Signed)
Stable with no complaints at today's visit. 

## 2012-03-01 NOTE — Assessment & Plan Note (Signed)
Interval history is without major illness or injury. Physical exam is OK. Lab results reveal mildly elevated LDL cholesterol otherwise normal. She is current with colorectal and breast cancer screening. Immunizations: eligible for shingles and pneumonia vaccine.  IN summary - a very nice woman who needs to improve adherence to low fat diet and resume regular aerobic exercise, i.e. Water based exercise would be great. She will return in 6 months for follow-up lab.

## 2012-04-22 ENCOUNTER — Emergency Department (HOSPITAL_COMMUNITY)
Admission: EM | Admit: 2012-04-22 | Discharge: 2012-04-22 | Disposition: A | Discharge status: Home / Self Care | Payer: PRIVATE HEALTH INSURANCE | Attending: Emergency Medicine | Admitting: Emergency Medicine

## 2012-04-22 ENCOUNTER — Encounter (HOSPITAL_COMMUNITY): Payer: Self-pay | Admitting: *Deleted

## 2012-04-22 DIAGNOSIS — L509 Urticaria, unspecified: Secondary | ICD-10-CM

## 2012-04-22 DIAGNOSIS — T7840XA Allergy, unspecified, initial encounter: Secondary | ICD-10-CM

## 2012-04-22 DIAGNOSIS — R21 Rash and other nonspecific skin eruption: Secondary | ICD-10-CM | POA: Insufficient documentation

## 2012-04-22 DIAGNOSIS — K219 Gastro-esophageal reflux disease without esophagitis: Secondary | ICD-10-CM | POA: Insufficient documentation

## 2012-04-22 DIAGNOSIS — K589 Irritable bowel syndrome without diarrhea: Secondary | ICD-10-CM | POA: Insufficient documentation

## 2012-04-22 DIAGNOSIS — Z79899 Other long term (current) drug therapy: Secondary | ICD-10-CM | POA: Insufficient documentation

## 2012-04-22 DIAGNOSIS — T4995XA Adverse effect of unspecified topical agent, initial encounter: Secondary | ICD-10-CM | POA: Insufficient documentation

## 2012-04-22 HISTORY — DX: Reserved for inherently not codable concepts without codable children: IMO0001

## 2012-04-22 HISTORY — DX: Irritable bowel syndrome, unspecified: K58.9

## 2012-04-22 HISTORY — DX: Gastro-esophageal reflux disease without esophagitis: K21.9

## 2012-04-22 MED ORDER — RANITIDINE HCL 150 MG PO CAPS: 150.0000 mg | ORAL_CAPSULE | Freq: Every day | ORAL | Status: DC

## 2012-04-22 MED ORDER — EPINEPHRINE 0.3 MG/0.3ML IJ DEVI: 0.3000 mg | INTRAMUSCULAR | Status: DC | PRN

## 2012-04-22 NOTE — ED Notes (Signed)
Patient is here with allergic reaction.. States that she started itching and rash appeared on her body.  Patient drank benadryl liquid PTA and it helped slightly

## 2012-04-22 NOTE — ED Notes (Signed)
Pt reports allergic reaction at about 1145pm; pt states she woke up suddenly with itching that was all over her body; pt has some noted redness to extremities where she scratched as well as some raised bumps to her neck; pt took benadryl PTA with some relief; pt states she still has the urge to scratch;

## 2012-04-22 NOTE — ED Provider Notes (Signed)
History     CSN: [id number]  Arrival date & time 04/22/12  0044   First MD Initiated Contact with Patient 04/22/12 0125      Chief Complaint  Patient presents with  . Allergic Reaction   HPI  History provided by patient and spouse. Patient is a 61 year old female with history of IBS and reflux who presents with concerns for allergic reaction. Patient reports going to bed around 10 PM last night. She awoke with symptoms of itching around her right urine head at midnight. Patient went to the restroom and began having or itching on her and torso and body. Patient also reports having "whelps" and rash to the skin. Patient did take doses of liquid Benadryl at home prior to arrival. She denied having any symptoms of throat swelling, lip swelling or tongue swelling. Denies any difficulty breathing or swallowing. Since taking the medication and driving to the emergency room she reports symptoms and rash have improved. She does still have a slight itching sensation over her skin. She denies any new soaps, body lotions or clothing. Denies any unusual or new foods from yesterday. Denies any new medications. She has no similar symptoms previously.     Past Medical History  Diagnosis Date  . IBS (irritable bowel syndrome)   . Reflux     History reviewed. No pertinent past surgical history.  No family history on file.  History  Substance Use Topics  . Smoking status: Never Smoker   . Smokeless tobacco: Not on file  . Alcohol Use: Yes    OB History    Grav Para Term Preterm Abortions TAB SAB Ect Mult Living                  Review of Systems  Constitutional: Negative for fever, chills and diaphoresis.  Respiratory: Negative for cough and shortness of breath.   Cardiovascular: Negative for chest pain.  Skin: Positive for rash.  All other systems reviewed and are negative.    Allergies  Acetaminophen-codeine; Darvocet; and Vioxx  Home Medications   Current Outpatient Rx  Name   Route  Sig  Dispense  Refill  . VITAMIN C 100 MG PO TABS   Oral   Take 100 mg by mouth daily.         . CRESTOR 10 MG PO TABS      take 1 tablet by mouth once daily   30 tablet   1   . MULTI-VITAMIN/MINERALS PO TABS   Oral   Take 1 tablet by mouth daily.         Marland Kitchen PANTOPRAZOLE SODIUM 40 MG PO TBEC      TAKE 1 TABLET BY MOUTH ONCE DAILY   30 tablet   11   . ALBUTEROL SULFATE HFA 108 (90 BASE) MCG/ACT IN AERS   Inhalation   Inhale 1 puff into the lungs every 6 (six) hours as needed for wheezing.   1 Inhaler   0     BP 133/94  Pulse 71  Temp 97.9 F (36.6 C) (Oral)  Resp 16  SpO2 97%  Physical Exam  Nursing note and vitals reviewed. Constitutional: She is oriented to person, place, and time. She appears well-developed and well-nourished. No distress.  HENT:  Head: Normocephalic.  Mouth/Throat: Oropharynx is clear and moist.  Neck: Normal range of motion.  Cardiovascular: Normal rate and regular rhythm.   Pulmonary/Chest: Effort normal and breath sounds normal. No stridor. No respiratory distress. She has no wheezes. She  has no rales.  Abdominal: Soft.  Neurological: She is alert and oriented to person, place, and time.  Skin: Skin is warm and dry. No rash noted.  Psychiatric: She has a normal mood and affect. Her behavior is normal.    ED Course  Procedures     1. Allergic reaction   2. Hives       MDM  Patient seen and evaluated. Patient appears well in no acute distress. Currently patient without any rash or signs of allergic reaction. She has normal respirations and O2 sats.      Angus Seller, [place] 04/23/12 [phone]

## 2012-04-23 NOTE — ED Provider Notes (Signed)
  Medical screening examination/treatment/procedure(s) were performed by non-physician practitioner and as supervising physician I was immediately available for consultation/collaboration.    Vida Roller, MD 04/23/12 [phone]

## 2012-10-27 ENCOUNTER — Encounter: Payer: Self-pay | Admitting: Internal Medicine

## 2012-10-27 ENCOUNTER — Ambulatory Visit (INDEPENDENT_AMBULATORY_CARE_PROVIDER_SITE_OTHER): Payer: PRIVATE HEALTH INSURANCE | Admitting: Internal Medicine

## 2012-10-27 VITALS — BP 122/78 | HR 68 | Temp 97.8°F | Ht 71.0 in | Wt 188.6 lb

## 2012-10-27 DIAGNOSIS — R600 Localized edema: Secondary | ICD-10-CM

## 2012-10-27 DIAGNOSIS — I739 Peripheral vascular disease, unspecified: Secondary | ICD-10-CM

## 2012-10-27 DIAGNOSIS — R609 Edema, unspecified: Secondary | ICD-10-CM

## 2012-10-27 NOTE — Patient Instructions (Signed)
Peripheral Vascular Disease Peripheral Vascular Disease (PVD), also called Peripheral Arterial Disease (PAD), is a circulation problem caused by cholesterol (atherosclerotic plaque) deposits in the arteries. PVD commonly occurs in the lower extremities (legs) but it can occur in other areas of the body, such as your arms. The cholesterol buildup in the arteries reduces blood flow which can cause pain and other serious problems. The presence of PVD can place a person at risk for Coronary Artery Disease (CAD).  CAUSES  Causes of PVD can be many. It is usually associated with more than one risk factor such as:   High Cholesterol.  Smoking.  Diabetes.  Lack of exercise or inactivity.  High blood pressure (hypertension).  Obesity.  Family history. SYMPTOMS   When the lower extremities are affected, patients with PVD may experience:  Leg pain with exertion or physical activity. This is called INTERMITTENT CLAUDICATION. This may present as cramping or numbness with physical activity. The location of the pain is associated with the level of blockage. For example, blockage at the abdominal level (distal abdominal aorta) may result in buttock or hip pain. Lower leg arterial blockage may result in calf pain.  As PVD becomes more severe, pain can develop with less physical activity.  In people with severe PVD, leg pain may occur at rest.  Other PVD signs and symptoms:  Leg numbness or weakness.  Coldness in the affected leg or foot, especially when compared to the other leg.  A change in leg color.  Patients with significant PVD are more prone to ulcers or sores on toes, feet or legs. These may take longer to heal or may reoccur. The ulcers or sores can become infected.  If signs and symptoms of PVD are ignored, gangrene may occur. This can result in the loss of toes or loss of an entire limb.  Not all leg pain is related to PVD. Other medical conditions can cause leg pain such  as:  Blood clots (embolism) or Deep Vein Thrombosis.  Inflammation of the blood vessels (vasculitis).  Spinal stenosis. DIAGNOSIS  Diagnosis of PVD can involve several different types of tests. These can include:  Pulse Volume Recording Method (PVR). This test is simple, painless and does not involve the use of X-rays. PVR involves measuring and comparing the blood pressure in the arms and legs. An ABI (Ankle-Brachial Index) is calculated. The normal ratio of blood pressures is 1. As this number becomes smaller, it indicates more severe disease.  < 0.95  indicates significant narrowing in one or more leg vessels.  <0.8 there will usually be pain in the foot, leg or buttock with exercise.  <0.4 will usually have pain in the legs at rest.  <0.25  usually indicates limb threatening PVD.  Doppler detection of pulses in the legs. This test is painless and checks to see if you have a pulses in your legs/feet.  A dye or contrast material (a substance that highlights the blood vessels so they show up on x-ray) may be given to help your caregiver better see the arteries for the following tests. The dye is eliminated from your body by the kidney's. Your caregiver may order blood work to check your kidney function and other laboratory values before the following tests are performed:  Magnetic Resonance Angiography (MRA). An MRA is a picture study of the blood vessels and arteries. The MRA machine uses a large magnet to produce images of the blood vessels.  Computed Tomography Angiography (CTA). A CTA is a   specialized x-ray that looks at how the blood flows in your blood vessels. An IV may be inserted into your arm so contrast dye can be injected.  Angiogram. Is a procedure that uses x-rays to look at your blood vessels. This procedure is minimally invasive, meaning a small incision (cut) is made in your groin. A small tube (catheter) is then inserted into the artery of your groin. The catheter is  guided to the blood vessel or artery your caregiver wants to examine. Contrast dye is injected into the catheter. X-rays are then taken of the blood vessel or artery. After the images are obtained, the catheter is taken out. TREATMENT  Treatment of PVD involves many interventions which may include:  Lifestyle changes:  Quitting smoking.  Exercise.  Following a low fat, low cholesterol diet.  Control of diabetes.  Foot care is very important to the PVD patient. Good foot care can help prevent infection.  Medication:  Cholesterol-lowering medicine.  Blood pressure medicine.  Anti-platelet drugs.  Certain medicines may reduce symptoms of Intermittent Claudication.  Interventional/Surgical options:  Angioplasty. An Angioplasty is a procedure that inflates a balloon in the blocked artery. This opens the blocked artery to improve blood flow.  Stent Implant. A wire mesh tube (stent) is placed in the artery. The stent expands and stays in place, allowing the artery to remain open.  Peripheral Bypass Surgery. This is a surgical procedure that reroutes the blood around a blocked artery to help improve blood flow. This type of procedure may be performed if Angioplasty or stent implants are not an option. SEEK IMMEDIATE MEDICAL CARE IF:   You develop pain or numbness in your arms or legs.  Your arm or leg turns cold, becomes blue in color.  You develop redness, warmth, swelling and pain in your arms or legs. MAKE SURE YOU:   Understand these instructions.  Will watch your condition.  Will get help right away if you are not doing well or get worse. Document Released: 06/13/2004 Document Revised: 07/29/2011 Document Reviewed: 05/10/2008 [employer] Patient Information 2014 [employer].  

## 2012-10-27 NOTE — Progress Notes (Signed)
Subjective:    Patient ID: Jennifer Frank, female    DOB: 02-Oct-1950, 62 y.o.   MRN: [id number]  HPI  Pt presents to the clinic today with c/o bilateral leg swelling, pain and redness. This started a few weeks ago. The swelling and redness gets worst by the end of the day. It ends up being very itchy because of the swelling. She has not put anything on it. She has taken benadryl without relief. She does have a history of DVT in the past. She reports this feels different. She has no pain in the calf. She denies chest pain, chest tightness or difficulty breathing. She has not come in contact with anything that she is allergic to. She does not smoke. She has no history of PAD, PVD.  Review of Systems      Past Medical History  Diagnosis Date  . IBS (irritable bowel syndrome)   . Reflux     Current Outpatient Prescriptions  Medication Sig Dispense Refill  . Ascorbic Acid (VITAMIN C) 100 MG tablet Take 100 mg by mouth daily.      . CRESTOR 10 MG tablet take 1 tablet by mouth once daily  30 tablet  1  . Multiple Vitamins-Minerals (MULTIVITAMIN WITH MINERALS) tablet Take 1 tablet by mouth daily.      . pantoprazole (PROTONIX) 40 MG tablet TAKE 1 TABLET BY MOUTH ONCE DAILY  30 tablet  11  . ranitidine (ZANTAC) 150 MG capsule Take 1 capsule (150 mg total) by mouth daily.  30 capsule  0  . EPINEPHrine (EPIPEN) 0.3 mg/0.3 mL DEVI Inject 0.3 mLs (0.3 mg total) into the muscle as needed.  1 Device  2   No current facility-administered medications for this visit.    Allergies  Allergen Reactions  . Acetaminophen-Codeine Nausea And Vomiting  . Darvocet (Propoxyphene-Acetaminophen) Nausea And Vomiting  . Vioxx (Rofecoxib) Hives and Swelling    History reviewed. No pertinent family history.  History   Social History  . Marital Status: Married    Spouse Name: N/A    Number of Children: N/A  . Years of Education: N/A   Occupational History  . Not on file.   Social History Main Topics   . Smoking status: Never Smoker   . Smokeless tobacco: Not on file  . Alcohol Use: Yes  . Drug Use: Not on file  . Sexually Active: Not on file   Other Topics Concern  . Not on file   Social History Narrative  . No narrative on file     Constitutional: Denies fever, malaise, fatigue, headache or abrupt weight changes.  Respiratory: Denies difficulty breathing, shortness of breath, cough or sputum production.   Cardiovascular: Pt presents bilateral leg swelling. Denies chest pain, chest tightness, palpitations or swelling in the hands or feet.  Musculoskeletal: Denies decrease in range of motion, difficulty with gait, muscle pain or joint pain and swelling.  Skin: Pt reports bilateral leg redness and tenderness. Denies rashes, lesions or ulcercations.  Neurological: Denies dizziness, difficulty with memory, difficulty with speech or problems with balance and coordination.   No other specific complaints in a complete review of systems (except as listed in HPI above).  Objective:   Physical Exam  BP 122/78  Pulse 68  Temp(Src) 97.8 F (36.6 C) (Oral)  Ht 5\' 11"  (1.803 m)  Wt 188 lb 9.6 oz (85.548 kg)  BMI 26.32 kg/m2  SpO2 95% Wt Readings from Last 3 Encounters:  10/27/12 188 lb 9.6 oz (  85.548 kg)  02/26/12 192 lb (87.091 kg)  07/07/11 198 lb (89.812 kg)    General: Appears her stated age, well developed, well nourished in NAD. Skin: Warm, dry and intact. Ruddy appearance to BLE, toes cool to touch, pale. Cardiovascular: Normal rate and rhythm. S1,S2 noted.  No murmur, rubs or gallops noted. No JVD , 2 + pitting edema of BLE. No carotid bruits noted. 2+ radials, 1+ pedals. Pulmonary/Chest: Normal effort and positive vesicular breath sounds. No respiratory distress. No wheezes, rales or ronchi noted.  Musculoskeletal: Normal range of motion. No signs of joint swelling. No difficulty with gait.  Neurological: Alert and oriented. Cranial nerves II-XII intact. Coordination  normal. +DTRs bilaterally.   BMET    Component Value Date/Time   NA 138 02/26/2012 1501   K 4.5 02/26/2012 1501   CL 105 02/26/2012 1501   CO2 27 02/26/2012 1501   GLUCOSE 85 02/26/2012 1501   GLUCOSE 99 06/03/2006 0922   BUN 11 02/26/2012 1501   CREATININE 0.9 02/26/2012 1501   CALCIUM 9.2 02/26/2012 1501   GFRNONAA 73.65 05/11/2010 0947   GFRAA  Value: >60        The eGFR has been calculated using the MDRD equation. This calculation has not been validated in all clinical situations. eGFR's persistently <60 mL/min signify possible Chronic Kidney Disease. 10/19/2009 1910    Lipid Panel     Component Value Date/Time   CHOL 213* 02/26/2012 1501   TRIG 74.0 02/26/2012 1501   HDL 61.80 02/26/2012 1501   CHOLHDL 3 02/26/2012 1501   VLDL 14.8 02/26/2012 1501   LDLCALC 97 05/11/2010 0947    CBC    Component Value Date/Time   WBC 8.2 02/26/2012 1501   RBC 4.45 02/26/2012 1501   HGB 12.7 02/26/2012 1501   HCT 38.6 02/26/2012 1501   PLT 281.0 02/26/2012 1501   MCV 86.8 02/26/2012 1501   MCHC 32.8 02/26/2012 1501   RDW 13.3 02/26/2012 1501   LYMPHSABS 2.0 02/26/2012 1501   MONOABS 0.5 02/26/2012 1501   EOSABS 0.2 02/26/2012 1501   BASOSABS 0.0 02/26/2012 1501    Hgb A1C No results found for this basename: HGBA1C         Assessment & Plan:   BLE swelling and discoloration likely secondary to PVD:  Will refer to vascular for further workup and treatment May take ASA daily

## 2012-12-17 ENCOUNTER — Encounter: Payer: PRIVATE HEALTH INSURANCE | Admitting: Vascular Surgery

## 2013-01-15 ENCOUNTER — Other Ambulatory Visit: Payer: Self-pay | Admitting: *Deleted

## 2013-01-15 DIAGNOSIS — I739 Peripheral vascular disease, unspecified: Secondary | ICD-10-CM

## 2013-01-22 ENCOUNTER — Encounter: Payer: Self-pay | Admitting: Surgery

## 2013-01-25 ENCOUNTER — Ambulatory Visit (INDEPENDENT_AMBULATORY_CARE_PROVIDER_SITE_OTHER): Payer: PRIVATE HEALTH INSURANCE | Admitting: Surgery

## 2013-01-25 ENCOUNTER — Encounter (INDEPENDENT_AMBULATORY_CARE_PROVIDER_SITE_OTHER): Payer: PRIVATE HEALTH INSURANCE | Admitting: Vascular Surgery

## 2013-01-25 ENCOUNTER — Other Ambulatory Visit: Payer: Self-pay | Admitting: *Deleted

## 2013-01-25 ENCOUNTER — Encounter: Payer: Self-pay | Admitting: Surgery

## 2013-01-25 VITALS — BP 129/74 | HR 62 | Ht 71.0 in | Wt 188.3 lb

## 2013-01-25 DIAGNOSIS — M7989 Other specified soft tissue disorders: Secondary | ICD-10-CM

## 2013-01-25 DIAGNOSIS — I83893 Varicose veins of bilateral lower extremities with other complications: Secondary | ICD-10-CM

## 2013-01-25 DIAGNOSIS — I739 Peripheral vascular disease, unspecified: Secondary | ICD-10-CM

## 2013-01-25 DIAGNOSIS — M79609 Pain in unspecified limb: Secondary | ICD-10-CM | POA: Insufficient documentation

## 2013-01-25 DIAGNOSIS — Z86718 Personal history of other venous thrombosis and embolism: Secondary | ICD-10-CM

## 2013-01-25 DIAGNOSIS — R609 Edema, unspecified: Secondary | ICD-10-CM

## 2013-01-25 NOTE — Progress Notes (Signed)
[facility] [place] [place]   Patient name: Jennifer Frank MRN: [id number] DOB: [birth year] Sex: female   Referred by: Dr. Arthur Holms  Reason for referral:  Chief Complaint  Patient presents with  . New Evaluation    PAD vascular consult - Dr. Debby Bud pt c/o swelling of LE, redness/tenderness, R pain behind the knee X 4 months    HISTORY OF PRESENT ILLNESS: This is a 62 year old female who is referred for leg swelling, redness, and tenderness. She states that her symptoms began back in May. She states that her symptoms are worse with prolonged standing, particularly at the end of the day. She has had areas that become raised up and had blister formation. She has no open wounds at this time. The redness has dissipated. Originally it was up near her knees, now it is down to her ankles. Her right leg bothers her more so than the left.  The patient denies any history of trauma. She does have a history of a DVT which was following hip surgery. Her mother had vein stripping. The patient states that she does not have prominent varicosities.   She is medically managed for hypercholesterolemia with a statin. She also was treated for gastroesophageal reflux disease. She is a nonsmoker.  Past Medical History  Diagnosis Date  . IBS (irritable bowel syndrome)   . Reflux     Past Surgical History  Procedure Laterality Date  . Joint replacement Bilateral [id number]    hip  . Knee arthroscopy Left 1996  . Hip arthroscopy Left 1997  . Tonsillectomy and adenoidectomy  1957    History   Social History  . Marital Status: Married    Spouse Name: N/A    Number of Children: N/A  . Years of Education: N/A   Occupational History  . Not on file.   Social History Main Topics  . Smoking status: Never Smoker   . Smokeless tobacco: Not on file  . Alcohol Use: 0.6 oz/week    1 Glasses of wine per week  . Drug Use: No  . Sexual Activity: Not on file   Other Topics Concern  . Not on  file   Social History Narrative  . No narrative on file    Family History  Problem Relation Age of Onset  . Deep vein thrombosis Mother   . Heart disease Mother     before age 53  . Hyperlipidemia Mother   . Hypertension Mother   . Other Mother     varicose veins  . Heart attack Mother   . Peripheral vascular disease Mother   . Bleeding Disorder Mother   . Deep vein thrombosis Father   . Heart disease Father     before age 86  . Hyperlipidemia Father   . Hypertension Father   . Heart attack Father   . Bleeding Disorder Father   . AAA (abdominal aortic aneurysm) Father   . Cancer Brother   . Hyperlipidemia Brother     Allergies as of 01/25/2013 - Review Complete 01/25/2013  Allergen Reaction Noted  . Acetaminophen-codeine Nausea And Vomiting 07/07/2011  . Darvocet [propoxyphene-acetaminophen] Nausea And Vomiting 07/07/2011  . Vioxx [rofecoxib] Hives and Swelling 07/07/2011    Current Outpatient Prescriptions on File Prior to Visit  Medication Sig Dispense Refill  . Ascorbic Acid (VITAMIN C) 100 MG tablet Take 100 mg by mouth daily.      Marland Kitchen EPINEPHrine (EPIPEN) 0.3 mg/0.3 mL DEVI Inject 0.3 mLs (0.3 mg total) into  the muscle as needed.  1 Device  2  . Multiple Vitamins-Minerals (MULTIVITAMIN WITH MINERALS) tablet Take 1 tablet by mouth daily.      . pantoprazole (PROTONIX) 40 MG tablet TAKE 1 TABLET BY MOUTH ONCE DAILY  30 tablet  11  . CRESTOR 10 MG tablet take 1 tablet by mouth once daily  30 tablet  1  . ranitidine (ZANTAC) 150 MG capsule Take 1 capsule (150 mg total) by mouth daily.  30 capsule  0   No current facility-administered medications on file prior to visit.     REVIEW OF SYSTEMS: Cardiovascular: No chest pain, chest pressure, palpitations, orthopnea, or dyspnea on exertion. No claudication or rest pain. Positive history of DVT. Positive pain in legs when walking and and feet when lying flat. Positive for leg swelling  Pulmonary: No productive cough,  asthma or wheezing. Neurologic: No weakness, paresthesias, aphasia, or amaurosis. No dizziness. Hematologic: No bleeding problems or clotting disorders. Musculoskeletal: No joint pain or joint swelling. Gastrointestinal: No blood in stool or hematemesis Genitourinary: No dysuria or hematuria. Psychiatric:: No history of major depression. Integumentary: No rashes or ulcers. Constitutional: No fever or chills.  PHYSICAL EXAMINATION: General: The patient appears their stated age.  Vital signs are BP 129/74  Pulse 62  Ht 5\' 11"  (1.803 m)  Wt 188 lb 4.8 oz (85.412 kg)  BMI 26.27 kg/m2  SpO2 100% HEENT:  No gross abnormalities Pulmonary: Respirations are non-labored Abdomen: Soft and non-tender . Aorta is not palpable. No masses appreciated  Musculoskeletal: There are no major deformities.   Neurologic: No focal weakness or paresthesias are detected, Skin: There are no ulcer or rashes noted. Psychiatric: The patient has normal affect. Cardiovascular: There is a regular rate and rhythm without significant murmur appreciated. palpable pedal pulses bilaterally. The patient does have edema up to the knee bilaterally. There is skin changes around the ankle region bilaterally without ulceration   Diagnostic Studies:  ABIs were checked today. At the right is 1.1. The left is 1.1. Waveforms are triphasic. Venous duplex was also ordered and reviewed. The patient has no deep system reflux bilaterally. No superficial system reflux is noted on the right. There is possible small saphenous reflux on the left. No DVT identified bilaterally    Assessment:   bilateral leg swelling  Plan: The patient's leg swelling is not secondary to venous insufficiency. Therefore think this most likely represents lymphedema. I discussed that the best treatment option for her would be lifelong compression stockings. Given her a prescription for 20-30 mm knee-high compression. She will contact me if she has any further  questions.     Jorge Ny, M.D. [facility] Office: [phone] Pager:  [phone]

## 2013-04-19 ENCOUNTER — Encounter: Payer: Self-pay | Admitting: Internal Medicine

## 2013-07-01 ENCOUNTER — Encounter: Payer: Self-pay | Admitting: Internal Medicine

## 2013-08-05 ENCOUNTER — Ambulatory Visit (INDEPENDENT_AMBULATORY_CARE_PROVIDER_SITE_OTHER): Payer: PRIVATE HEALTH INSURANCE | Admitting: Internal Medicine

## 2013-08-05 ENCOUNTER — Encounter: Payer: Self-pay | Admitting: Internal Medicine

## 2013-08-05 VITALS — BP 108/66 | HR 72 | Ht 71.0 in | Wt 192.4 lb

## 2013-08-05 DIAGNOSIS — Z1211 Encounter for screening for malignant neoplasm of colon: Secondary | ICD-10-CM

## 2013-08-05 DIAGNOSIS — K219 Gastro-esophageal reflux disease without esophagitis: Secondary | ICD-10-CM

## 2013-08-05 MED ORDER — NA SULFATE-K SULFATE-MG SULF 17.5-3.13-1.6 GM/177ML PO SOLN: ORAL | Status: DC

## 2013-08-05 NOTE — Assessment & Plan Note (Signed)
OK on H2B/AA

## 2013-08-05 NOTE — Progress Notes (Signed)
    Subjective:    Patient ID: Jennifer Frank, female    DOB: 1950/09/01, 63 y.o.   MRN: [id number]  HPI The patient is here with her husband to discuss scheduling endoscopic procedures. She is due for an every 5 year colonoscopy because her brother had colon cancer (twice) and died at approximately aged 47. She also has a hx of fundic gland polyps and GERD - ? If needs an EGD. She has been weaning PPI and GERD sxs ok on Pepcid AC.  Medications, allergies, past medical history, past surgical history, family history and social history are reviewed and updated in the EMR.    Review of Systems As above    Objective:   Physical Exam General:  NAD Eyes:   anicteric Lungs:  clear Heart:  S1S2 no rubs, murmurs or gallops Abdomen:  soft and nontender, BS+ Ext:   no edema    Data Reviewed:   2009 EGD/colonoscopy/path reports     Assessment & Plan:  GERD  Special screening for malignant neoplasms, colon - Plan: Ambulatory referral to Gastroenterology  1) Schedule screening colonoscopy The risks and benefits as well as alternatives of endoscopic procedure(s) have been discussed and reviewed. All questions answered. The patient agrees to proceed. 2) Continue Pepcid AC - does not need a routine EGD

## 2013-08-05 NOTE — Patient Instructions (Addendum)
You have been scheduled for a colonoscopy with propofol. Please follow written instructions given to you at your visit today.  Please pick up your prep kit at the pharmacy within the next 1-3 days. If you use inhalers (even only as needed), please bring them with you on the day of your procedure.  I appreciate the opportunity to care for you.   

## 2013-09-22 ENCOUNTER — Encounter: Payer: Self-pay | Admitting: Internal Medicine

## 2013-09-22 ENCOUNTER — Ambulatory Visit (AMBULATORY_SURGERY_CENTER): Payer: Self-pay | Admitting: Internal Medicine

## 2013-09-22 VITALS — BP 113/71 | HR 66 | Temp 98.4°F | Resp 23 | Ht 71.0 in | Wt 192.0 lb

## 2013-09-22 DIAGNOSIS — Z860100 Personal history of colon polyps, unspecified: Secondary | ICD-10-CM

## 2013-09-22 DIAGNOSIS — D126 Benign neoplasm of colon, unspecified: Secondary | ICD-10-CM

## 2013-09-22 DIAGNOSIS — Z800 Family history of malignant neoplasm of digestive organs: Secondary | ICD-10-CM

## 2013-09-22 DIAGNOSIS — Z1211 Encounter for screening for malignant neoplasm of colon: Secondary | ICD-10-CM

## 2013-09-22 HISTORY — DX: Personal history of colon polyps, unspecified: Z86.0100

## 2013-09-22 HISTORY — DX: Personal history of colonic polyps: Z86.010

## 2013-09-22 MED ORDER — SODIUM CHLORIDE 0.9 % IV SOLN: 500.0000 mL | INTRAVENOUS | Status: DC

## 2013-09-22 NOTE — Op Note (Signed)
[place]  [address] [place], [address]   COLONOSCOPY PROCEDURE REPORT  PATIENT: Jennifer Frank, Jennifer Frank  MR#: [id number] BIRTHDATE: 1950/07/28 , 57  yrs. old GENDER: Female ENDOSCOPIST: Gatha Mayer, MD, [facility] PROCEDURE DATE:  09/22/2013 PROCEDURE:   Colonoscopy with snare polypectomy First Screening Colonoscopy - Avg.  risk and is 50 yrs.  old or older - No.  Prior Negative Screening - Now for repeat screening. Above average risk  History of Adenoma - Now for follow-up colonoscopy & has been > or = to 3 yrs.  N/A  Polyps Removed Today? Yes. ASA CLASS:   Class II INDICATIONS:Patient's immediate family history of colon cancer. MEDICATIONS: propofol (Diprivan) 200mg  IV  DESCRIPTION OF PROCEDURE:   After the risks benefits and alternatives of the procedure were thoroughly explained, informed consent was obtained.  A digital rectal exam revealed no abnormalities of the rectum.   The LB [id number]  endoscope was introduced through the anus and advanced to the cecum, which was identified by both the appendix and ileocecal valve. No adverse events experienced.   The quality of the prep was excellent using Suprep  The instrument was then slowly withdrawn as the colon was fully examined.      COLON FINDINGS: Two diminutive sessile polyps were found in the transverse colon.  A polypectomy was performed with a cold snare. The resection was complete and the polyp tissue was completely retrieved.   The colon mucosa was otherwise normal.  Retroflexed views revealed no abnormalities. The time to cecum=2 minutes 12 seconds.  Withdrawal time=8 minutes 47 seconds.  The scope was withdrawn and the procedure completed. COMPLICATIONS: There were no complications.  ENDOSCOPIC IMPRESSION: 1.   Two diminutive sessile polyps were found in the transverse colon; polypectomy was performed with a cold snare 2.   The colon mucosa was otherwise normal - excellent prep  - patient with family hx colon cancer  RECOMMENDATIONS: Timing of repeat colonoscopy will be determined by pathology findings.   eSigned:  Gatha Mayer, MD, [facility] 09/22/2013 11:48 AM   cc: The Patient

## 2013-09-22 NOTE — Progress Notes (Signed)
Called to room to assist during endoscopic procedure.  Patient ID and intended procedure confirmed with present staff. Received instructions for my participation in the procedure from the performing physician.  

## 2013-09-22 NOTE — Progress Notes (Signed)
Report to pacu rn, vss, bbs=clear 

## 2013-09-22 NOTE — Patient Instructions (Addendum)
I found and removed 2 small polyps today. They look benign.  I will let you know pathology results and when to have another routine colonoscopy by mail.  I appreciate the opportunity to care for you. Gatha Mayer, MD, FACG   YOU HAD AN ENDOSCOPIC PROCEDURE TODAY AT [facility]: Refer to the procedure report that was given to you for any specific questions about what was found during the examination.  If the procedure report does not answer your questions, please call your gastroenterologist to clarify.  If you requested that your care partner not be given the details of your procedure findings, then the procedure report has been included in a sealed envelope for you to review at your convenience later.  YOU SHOULD EXPECT: Some feelings of bloating in the abdomen. Passage of more gas than usual.  Walking can help get rid of the air that was put into your GI tract during the procedure and reduce the bloating. If you had a lower endoscopy (such as a colonoscopy or flexible sigmoidoscopy) you may notice spotting of blood in your stool or on the toilet paper. If you underwent a bowel prep for your procedure, then you may not have a normal bowel movement for a few days.  DIET: Your first meal following the procedure should be a light meal and then it is ok to progress to your normal diet.  A half-sandwich or bowl of soup is an example of a good first meal.  Heavy or fried foods are harder to digest and may make you feel nauseous or bloated.  Likewise meals heavy in dairy and vegetables can cause extra gas to form and this can also increase the bloating.  Drink plenty of fluids but you should avoid alcoholic beverages for 24 hours.  ACTIVITY: Your care partner should take you home directly after the procedure.  You should plan to take it easy, moving slowly for the rest of the day.  You can resume normal activity the day after the procedure however you should NOT DRIVE or use heavy  machinery for 24 hours (because of the sedation medicines used during the test).    SYMPTOMS TO REPORT IMMEDIATELY: A gastroenterologist can be reached at any hour.  During normal business hours, 8:30 AM to 5:00 PM Monday through Friday, call [phone].  After hours and on weekends, please call the GI answering service at [phone] who will take a message and have the physician on call contact you.   Following lower endoscopy (colonoscopy or flexible sigmoidoscopy):  Excessive amounts of blood in the stool  Significant tenderness or worsening of abdominal pains  Swelling of the abdomen that is new, acute  Fever of 100F or higher   FOLLOW UP: If any biopsies were taken you will be contacted by phone or by letter within the next 1-3 weeks.  Call your gastroenterologist if you have not heard about the biopsies in 3 weeks.  Our staff will call the home number listed on your records the next business day following your procedure to check on you and address any questions or concerns that you may have at that time regarding the information given to you following your procedure. This is a courtesy call and so if there is no answer at the home number and we have not heard from you through the emergency physician on call, we will assume that you have returned to your regular daily activities without incident.  SIGNATURES/CONFIDENTIALITY: You and/or your  care partner have signed paperwork which will be entered into your electronic medical record.  These signatures attest to the fact that that the information above on your After Visit Summary has been reviewed and is understood.  Full responsibility of the confidentiality of this discharge information lies with you and/or your care-partner.  Polyp information given.

## 2013-09-23 ENCOUNTER — Telehealth: Payer: Self-pay | Admitting: *Deleted

## 2013-09-23 NOTE — Telephone Encounter (Signed)
  Follow up Call-  Call back number 09/22/2013  Post procedure Call Back phone  # [phone]  Permission to leave phone message Yes     Patient questions:  Do you have a fever, pain , or abdominal swelling? no Pain Score  0 *  Have you tolerated food without any problems? yes  Have you been able to return to your normal activities? yes  Do you have any questions about your discharge instructions: Diet   no Medications  no Follow up visit  no  Do you have questions or concerns about your Care? no  Actions: * If pain score is 4 or above: No action needed, pain <4.

## 2013-09-27 ENCOUNTER — Encounter: Payer: Self-pay | Admitting: Internal Medicine

## 2013-09-27 NOTE — Progress Notes (Signed)
Quick Note:  Diminutive adenoma - repeat colonoscopy 2020 ______ 

## 2013-11-24 ENCOUNTER — Encounter (HOSPITAL_COMMUNITY): Payer: Self-pay | Admitting: Emergency Medicine

## 2013-11-24 ENCOUNTER — Emergency Department (HOSPITAL_COMMUNITY)
Admission: EM | Admit: 2013-11-24 | Discharge: 2013-11-24 | Disposition: A | Discharge status: Home / Self Care | Payer: BC Managed Care – PPO | Source: Home / Self Care

## 2013-11-24 DIAGNOSIS — J3089 Other allergic rhinitis: Secondary | ICD-10-CM

## 2013-11-24 DIAGNOSIS — R0982 Postnasal drip: Secondary | ICD-10-CM

## 2013-11-24 DIAGNOSIS — J029 Acute pharyngitis, unspecified: Secondary | ICD-10-CM

## 2013-11-24 LAB — POCT RAPID STREP A: Streptococcus, Group A Screen (Direct): NEGATIVE

## 2013-11-24 NOTE — ED Provider Notes (Signed)
CSN: [id number]     Arrival date & time 11/24/13  9024 History   First MD Initiated Contact with Patient 11/24/13 0831     Chief Complaint  Patient presents with  . Sore Throat   (Consider location/radiation/quality/duration/timing/severity/associated sxs/prior Treatment) HPI Comments: PND, nasal congestion, discomfort below L ear, cough at night for 4 weeks. No fever.   Past Medical History  Diagnosis Date  . IBS (irritable bowel syndrome)   . Reflux   . Fundic gland polyps of stomach, benign   . Personal history of colonic polyps - adenoma 09/22/2013   Past Surgical History  Procedure Laterality Date  . Joint replacement Bilateral [id number]    hip  . Knee arthroscopy Left 1996  . Hip arthroscopy Left 1997  . Tonsillectomy and adenoidectomy  1957  . Colonoscopy    . Esophagogastroduodenoscopy     Family History  Problem Relation Age of Onset  . Deep vein thrombosis Mother   . Heart disease Mother     before age 41  . Hyperlipidemia Mother   . Hypertension Mother   . Other Mother     varicose veins  . Heart attack Mother   . Peripheral vascular disease Mother   . Bleeding Disorder Mother   . Deep vein thrombosis Father   . Heart disease Father     before age 55  . Hyperlipidemia Father   . Hypertension Father   . Heart attack Father   . Bleeding Disorder Father   . AAA (abdominal aortic aneurysm) Father   . Cancer Brother   . Hyperlipidemia Brother    History  Substance Use Topics  . Smoking status: Never Smoker   . Smokeless tobacco: Never Used  . Alcohol Use: No   OB History   Grav Para Term Preterm Abortions TAB SAB Ect Mult Living                 Review of Systems  Constitutional: Negative for fever, chills, activity change, appetite change and fatigue.  HENT: Positive for congestion, postnasal drip, rhinorrhea and sore throat. Negative for facial swelling.   Eyes: Negative.   Respiratory: Negative.   Cardiovascular: Negative.   Genitourinary:  Negative.   Musculoskeletal: Negative for neck pain and neck stiffness.  Skin: Negative for pallor and rash.  Neurological: Negative.     Allergies  Acetaminophen-codeine; Darvocet; and Vioxx  Home Medications   Prior to Admission medications   Medication Sig Start Date End Date Taking? Authorizing Provider  Ascorbic Acid (VITAMIN C) 100 MG tablet Take 100 mg by mouth daily.    Historical Provider, MD  famotidine (PEPCID AC) 10 MG chewable tablet Chew 10 mg by mouth at bedtime as needed for heartburn.    Historical Provider, MD  Multiple Vitamins-Minerals (MULTIVITAMIN WITH MINERALS) tablet Take 1 tablet by mouth daily.    Historical Provider, MD   BP 118/72  Pulse 75  Temp(Src) 99.4 F (37.4 C) (Oral)  Resp 14  SpO2 100% Physical Exam  Nursing note and vitals reviewed. Constitutional: She is oriented to person, place, and time. She appears well-developed and well-nourished. No distress.  HENT:  Mouth/Throat: No oropharyngeal exudate.  Biat TM's nl OP red with cobblestoning and clear PND.  Eyes: Conjunctivae and EOM are normal.  Neck: Normal range of motion. Neck supple.  Cardiovascular: Normal rate, regular rhythm and normal heart sounds.   Pulmonary/Chest: Effort normal and breath sounds normal. No respiratory distress. She has no wheezes. She has no rales.  Musculoskeletal: Normal range of motion. She exhibits no edema.  Lymphadenopathy:    She has no cervical adenopathy.  Neurological: She is alert and oriented to person, place, and time.  Skin: Skin is warm and dry. No rash noted.  Psychiatric: She has a normal mood and affect.    ED Course  Procedures (including critical care time) Labs Review Labs Reviewed  POCT RAPID STREP A ([facility] CARE ONLY)    Imaging Review No results found.  Results for orders placed during the hospital encounter of 11/24/13  POCT RAPID STREP A ([facility] CARE ONLY)      Result Value Ref Range   Streptococcus, Group A Screen (Direct)  NEGATIVE  NEGATIVE    MDM   1. Other allergic rhinitis   2. PND (post-nasal drip)   3. Allergic pharyngitis     Allegra 180 mg a day Sudafed PE 10 mg for congestion Flonase nasal spray Lots of saline nasal spray Ibuprofen 600 mg every 8 hours for pain      Janne Napoleon, NP 11/24/13 [phone]

## 2013-11-24 NOTE — Discharge Instructions (Signed)
Allergic Rhinitis Allegra 180 mg a day Sudafed PE 10 mg for congestion Flonase nasal spray Lots of saline nasal spray Ibuprofen 600 mg every 8 hours for pain Chloraseptic and cepacol lozenges Allergic rhinitis is when the mucous membranes in the nose respond to allergens. Allergens are particles in the air that cause your body to have an allergic reaction. This causes you to release allergic antibodies. Through a chain of events, these eventually cause you to release histamine into the blood stream. Although meant to protect the body, it is this release of histamine that causes your discomfort, such as frequent sneezing, congestion, and an itchy, runny nose.  CAUSES  Seasonal allergic rhinitis (hay fever) is caused by pollen allergens that may come from grasses, trees, and weeds. Year-round allergic rhinitis (perennial allergic rhinitis) is caused by allergens such as house dust mites, pet dander, and mold spores.  SYMPTOMS   Nasal stuffiness (congestion).  Itchy, runny nose with sneezing and tearing of the eyes. DIAGNOSIS  Your health care provider can help you determine the allergen or allergens that trigger your symptoms. If you and your health care provider are unable to determine the allergen, skin or blood testing may be used. TREATMENT  Allergic rhinitis does not have a cure, but it can be controlled by:  Medicines and allergy shots (immunotherapy).  Avoiding the allergen. Hay fever may often be treated with antihistamines in pill or nasal spray forms. Antihistamines block the effects of histamine. There are over-the-counter medicines that may help with nasal congestion and swelling around the eyes. Check with your health care provider before taking or giving this medicine.  If avoiding the allergen or the medicine prescribed do not work, there are many new medicines your health care provider can prescribe. Stronger medicine may be used if initial measures are ineffective.  Desensitizing injections can be used if medicine and avoidance does not work. Desensitization is when a patient is given ongoing shots until the body becomes less sensitive to the allergen. Make sure you follow up with your health care provider if problems continue. HOME CARE INSTRUCTIONS It is not possible to completely avoid allergens, but you can reduce your symptoms by taking steps to limit your exposure to them. It helps to know exactly what you are allergic to so that you can avoid your specific triggers. SEEK MEDICAL CARE IF:   You have a fever.  You develop a cough that does not stop easily (persistent).  You have shortness of breath.  You start wheezing.  Symptoms interfere with normal daily activities. Document Released: 01/29/2001 Document Revised: 05/11/2013 Document Reviewed: 01/11/2013 [facility] Patient Information 2015 [place], [place]. This information is not intended to replace advice given to you by your health care provider. Make sure you discuss any questions you have with your health care provider.  Cough, Adult  A cough is a reflex. It helps you clear your throat and airways. A cough can help heal your body. A cough can last 2 or 3 weeks (acute) or may last more than 8 weeks (chronic). Some common causes of a cough can include an infection, allergy, or a cold. HOME CARE  Only take medicine as told by your doctor.  If given, take your medicines (antibiotics) as told. Finish them even if you start to feel better.  Use a cold steam vaporizer or humidier in your home. This can help loosen thick spit (secretions).  Sleep so you are almost sitting up (semi-upright). Use pillows to do this. This helps  reduce coughing.  Rest as needed.  Stop smoking if you smoke. GET HELP RIGHT AWAY IF:  You have yellowish-white fluid (pus) in your thick spit.  Your cough gets worse.  Your medicine does not reduce coughing, and you are losing sleep.  You cough up blood.  You have  trouble breathing.  Your pain gets worse and medicine does not help.  You have a fever. MAKE SURE YOU:   Understand these instructions.  Will watch your condition.  Will get help right away if you are not doing well or get worse. Document Released: 01/17/2011 Document Revised: 07/29/2011 Document Reviewed: 01/17/2011 [facility] Patient Information 2015 [place], [place]. This information is not intended to replace advice given to you by your health care provider. Make sure you discuss any questions you have with your health care provider.

## 2013-11-24 NOTE — ED Notes (Signed)
Pt  Reports   sorethroat        X    sev  Weeks               With  Symptoms  Not  releived  By  Tylenol         Pt  Reports  Some  Congestion         As  Well             Pt  Reports  Some     Swelling of  The  Lymph  Nodes  Of  Her  Neck  As  Well

## 2013-11-25 NOTE — ED Provider Notes (Signed)
Medical screening examination/treatment/procedure(s) were performed by non-physician practitioner and as supervising physician I was immediately available for consultation/collaboration.  Philipp Deputy, M.D.  Harden Mo, MD 11/25/13 [phone]

## 2013-11-26 LAB — CULTURE, GROUP A STREP

## 2014-01-25 ENCOUNTER — Encounter: Payer: Self-pay | Admitting: Internal Medicine

## 2014-03-16 ENCOUNTER — Telehealth: Payer: Self-pay | Admitting: Internal Medicine

## 2014-03-16 NOTE — Telephone Encounter (Signed)
Verify I told husband I would follow him please Immunization Ok ; she needs CPX , last in 2013

## 2014-03-16 NOTE — Telephone Encounter (Signed)
Left message for patient to call back to schedule TDAP and to establish with a provider.

## 2014-03-16 NOTE — Telephone Encounter (Signed)
Is requesting TDAP.  Jennifer Frank patient.  Is also requesting to establish with you until retirement.  States you are following her husband [name].

## 2014-03-17 ENCOUNTER — Ambulatory Visit: Payer: BC Managed Care – PPO

## 2014-03-17 DIAGNOSIS — Z23 Encounter for immunization: Secondary | ICD-10-CM

## 2014-03-17 MED ORDER — TETANUS-DIPHTH-ACELL PERTUSSIS 5-2.5-18.5 LF-MCG/0.5 IM SUSP: 0.5000 mL | Freq: Once | INTRAMUSCULAR | Status: DC

## 2014-04-22 ENCOUNTER — Other Ambulatory Visit (INDEPENDENT_AMBULATORY_CARE_PROVIDER_SITE_OTHER): Payer: BC Managed Care – PPO

## 2014-04-22 ENCOUNTER — Encounter: Payer: Self-pay | Admitting: Internal Medicine

## 2014-04-22 ENCOUNTER — Ambulatory Visit (INDEPENDENT_AMBULATORY_CARE_PROVIDER_SITE_OTHER): Payer: BC Managed Care – PPO | Admitting: Internal Medicine

## 2014-04-22 VITALS — BP 116/62 | HR 68 | Temp 98.1°F | Resp 16 | Ht 69.0 in | Wt 190.0 lb

## 2014-04-22 DIAGNOSIS — Z0000 Encounter for general adult medical examination without abnormal findings: Secondary | ICD-10-CM

## 2014-04-22 DIAGNOSIS — K219 Gastro-esophageal reflux disease without esophagitis: Secondary | ICD-10-CM

## 2014-04-22 DIAGNOSIS — H9190 Unspecified hearing loss, unspecified ear: Secondary | ICD-10-CM

## 2014-04-22 DIAGNOSIS — E785 Hyperlipidemia, unspecified: Secondary | ICD-10-CM

## 2014-04-22 NOTE — Progress Notes (Signed)
Pre visit review using our clinic review tool, if applicable. No additional management support is needed unless otherwise documented below in the visit note. 

## 2014-04-22 NOTE — Patient Instructions (Signed)
We will check her blood today and call you back with those results.  We will see you back next year for your physical. If you have any new problems or questions before then please feel free to call our office.  We think that it would be worthwhile for you to get a hearing test as this may help you to feel more connected during conversations.  The other thing we want to do his work on getting exercise 3-4 days a week for about 20-30 minutes.  Exercise to Stay Healthy Exercise helps you become and stay healthy. EXERCISE IDEAS AND TIPS Choose exercises that:  You enjoy.  Fit into your day. You do not need to exercise really hard to be healthy. You can do exercises at a slow or medium level and stay healthy. You can:  Stretch before and after working out.  Try yoga, Pilates, or tai chi.  Lift weights.  Walk fast, swim, jog, run, climb stairs, bicycle, dance, or rollerskate.  Take aerobic classes. Exercises that burn about 150 calories:  Running 1  miles in 15 minutes.  Playing volleyball for 45 to 60 minutes.  Washing and waxing a car for 45 to 60 minutes.  Playing touch football for 45 minutes.  Walking 1  miles in 35 minutes.  Pushing a stroller 1  miles in 30 minutes.  Playing basketball for 30 minutes.  Raking leaves for 30 minutes.  Bicycling 5 miles in 30 minutes.  Walking 2 miles in 30 minutes.  Dancing for 30 minutes.  Shoveling snow for 15 minutes.  Swimming laps for 20 minutes.  Walking up stairs for 15 minutes.  Bicycling 4 miles in 15 minutes.  Gardening for 30 to 45 minutes.  Jumping rope for 15 minutes.  Washing windows or floors for 45 to 60 minutes. Document Released: 06/08/2010 Document Revised: 07/29/2011 Document Reviewed: 06/08/2010 [facility] Patient Information 2015 [place], [place]. This information is not intended to replace advice given to you by your health care provider. Make sure you discuss any questions you have with your  health care provider.

## 2014-04-22 NOTE — Assessment & Plan Note (Signed)
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 She has not had hearing test. Spoke with her about the fact that hearing aids nowadays are much less intrusive and more discrete. She will think about getting the hearing test after the first of the year.

## 2014-04-22 NOTE — Progress Notes (Signed)
   Subjective:    Patient ID: Jennifer Frank, female    DOB: [birth year], 63 y.o.   MRN: [id number]  HPI Patient is a 63 year old female who comes in today to establish care. She is past medical history of GERD, hyperlipidemia. She has not had routine lab work in some time. She has no new complaints at today's visit. She does note some hearing loss and difficulty following conversations.  Review of Systems  Constitutional: Negative for fever, activity change, appetite change, fatigue and unexpected weight change.  HENT: Negative.   Eyes: Negative.   Respiratory: Negative for cough, chest tightness, shortness of breath and wheezing.   Cardiovascular: Negative for chest pain, palpitations and leg swelling.  Gastrointestinal: Negative for nausea, abdominal pain, diarrhea, constipation and abdominal distention.  Musculoskeletal: Positive for arthralgias. Negative for myalgias and back pain.  Skin: Negative.   Neurological: Negative.   Psychiatric/Behavioral: Negative.       Objective:   Physical Exam  Constitutional: She is oriented to person, place, and time. She appears well-developed and well-nourished.  HENT:  Head: Normocephalic and atraumatic.  Right Ear: External ear normal.  Left Ear: External ear normal.  Eyes: EOM are normal.  Neck: Normal range of motion.  Cardiovascular: Normal rate and regular rhythm.   Pulmonary/Chest: Effort normal and breath sounds normal. No respiratory distress. She has no wheezes. She has no rales.  Abdominal: Soft. Bowel sounds are normal. She exhibits no distension. There is no tenderness. There is no rebound.  Musculoskeletal: She exhibits no tenderness.  Neurological: She is alert and oriented to person, place, and time. Coordination normal.  Skin: Skin is warm and dry.   Filed Vitals:   04/22/14 1028  BP: 116/62  Pulse: 68  Temp: 98.1 F (36.7 C)  TempSrc: Oral  Resp: 16  Height: 5\' 9"  (1.753 m)  Weight: 190 lb (86.183 kg)  SpO2: 96%        Assessment & Plan:

## 2014-04-22 NOTE — Assessment & Plan Note (Signed)
Patient takes Pepcid as needed. Does fairly well.

## 2014-04-22 NOTE — Assessment & Plan Note (Signed)
Up-to-date on colonoscopy, Pap smear, mammogram, tetanus shot.

## 2014-04-22 NOTE — Assessment & Plan Note (Signed)
Unclear history, not currently on medication. Will check lipid panel today. May need to add back medication.

## 2014-04-23 LAB — HEMOGLOBIN A1C: Hgb A1c MFr Bld: 5.9 % (ref 4.6–6.5)

## 2014-04-24 LAB — LIPID PANEL
CHOLESTEROL: 224 mg/dL — AB (ref 0–200)
HDL: 52.2 mg/dL (ref >39.00)
LDL Cholesterol: 146 mg/dL — ABNORMAL HIGH (ref 0–99)
NonHDL: 171.8
Total CHOL/HDL Ratio: 4
Triglycerides: 127 mg/dL (ref 0.0–149.0)
VLDL: 25.4 mg/dL (ref 0.0–40.0)

## 2014-04-24 LAB — BASIC METABOLIC PANEL
BUN: 13 mg/dL (ref 6–23)
CO2: 27 mEq/L (ref 19–32)
CREATININE: 0.9 mg/dL (ref 0.4–1.2)
Calcium: 9.3 mg/dL (ref 8.4–10.5)
Chloride: 106 mEq/L (ref 96–112)
GFR: 70.75 mL/min (ref >60.00)
GLUCOSE: 93 mg/dL (ref 70–99)
POTASSIUM: 4.6 meq/L (ref 3.5–5.1)
Sodium: 139 mEq/L (ref 135–145)

## 2014-05-03 ENCOUNTER — Telehealth: Payer: Self-pay | Admitting: Internal Medicine

## 2014-05-03 NOTE — Telephone Encounter (Signed)
Did you call patient in regards to lab results?  Can you please give patient a call before 1:00pm.  She will be in patient care at [facility] from 1:00 on.

## 2014-05-03 NOTE — Telephone Encounter (Signed)
Patient requests that we leave a detailed msg regarding her lab results and instructions.

## 2014-10-18 ENCOUNTER — Telehealth: Payer: Self-pay | Admitting: Geriatric Medicine

## 2014-10-18 NOTE — Telephone Encounter (Signed)
Called patient to see if she has had a mammogram recently. No answer - left message.

## 2014-10-18 NOTE — Telephone Encounter (Signed)
She had Mammogram done last year at  [facility]

## 2015-06-06 ENCOUNTER — Emergency Department (HOSPITAL_COMMUNITY)
Admission: EM | Admit: 2015-06-06 | Discharge: 2015-06-06 | Disposition: A | Discharge status: Home / Self Care | Payer: BLUE CROSS/BLUE SHIELD | Attending: Emergency Medicine | Admitting: Emergency Medicine

## 2015-06-06 ENCOUNTER — Encounter (HOSPITAL_COMMUNITY): Payer: Self-pay | Admitting: Emergency Medicine

## 2015-06-06 ENCOUNTER — Emergency Department (HOSPITAL_COMMUNITY): Payer: BLUE CROSS/BLUE SHIELD

## 2015-06-06 DIAGNOSIS — K219 Gastro-esophageal reflux disease without esophagitis: Secondary | ICD-10-CM | POA: Diagnosis not present

## 2015-06-06 DIAGNOSIS — M545 Low back pain: Secondary | ICD-10-CM | POA: Diagnosis not present

## 2015-06-06 DIAGNOSIS — Z79899 Other long term (current) drug therapy: Secondary | ICD-10-CM | POA: Diagnosis not present

## 2015-06-06 DIAGNOSIS — Z86010 Personal history of colonic polyps: Secondary | ICD-10-CM | POA: Diagnosis not present

## 2015-06-06 DIAGNOSIS — Z86018 Personal history of other benign neoplasm: Secondary | ICD-10-CM | POA: Diagnosis not present

## 2015-06-06 DIAGNOSIS — M25551 Pain in right hip: Secondary | ICD-10-CM | POA: Diagnosis not present

## 2015-06-06 LAB — URINALYSIS, ROUTINE W REFLEX MICROSCOPIC
BILIRUBIN URINE: NEGATIVE
GLUCOSE, UA: NEGATIVE mg/dL
HGB URINE DIPSTICK: NEGATIVE
KETONES UR: NEGATIVE mg/dL
Leukocytes, UA: NEGATIVE
NITRITE: NEGATIVE
Protein, ur: NEGATIVE mg/dL
Specific Gravity, Urine: 1.008 (ref 1.005–1.030)
pH: 7 (ref 5.0–8.0)

## 2015-06-06 MED ORDER — MORPHINE SULFATE (PF) 4 MG/ML IV SOLN
4.0000 mg | Freq: Once | INTRAVENOUS | Status: AC
  Administered 2015-06-06: 4 mg via INTRAMUSCULAR
  Filled 2015-06-06: qty 1

## 2015-06-06 MED ORDER — DEXAMETHASONE SODIUM PHOSPHATE 10 MG/ML IJ SOLN
10.0000 mg | Freq: Once | INTRAMUSCULAR | Status: AC
  Administered 2015-06-06: 10 mg via INTRAMUSCULAR
  Filled 2015-06-06: qty 1

## 2015-06-06 MED ORDER — PREDNISONE 20 MG PO TABS: 40.0000 mg | ORAL_TABLET | Freq: Every day | ORAL | Status: DC

## 2015-06-06 MED ORDER — DIAZEPAM 2 MG PO TABS: 2.0000 mg | ORAL_TABLET | Freq: Four times a day (QID) | ORAL | Status: DC | PRN

## 2015-06-06 MED ORDER — DIAZEPAM 5 MG PO TABS
5.0000 mg | ORAL_TABLET | Freq: Once | ORAL | Status: AC
  Administered 2015-06-06: 5 mg via ORAL
  Filled 2015-06-06: qty 1

## 2015-06-06 MED ORDER — ONDANSETRON 8 MG PO TBDP
8.0000 mg | ORAL_TABLET | Freq: Once | ORAL | Status: AC
  Administered 2015-06-06: 8 mg via ORAL
  Filled 2015-06-06: qty 1

## 2015-06-06 MED ORDER — HYDROCODONE-ACETAMINOPHEN 5-325 MG PO TABS: 1.0000 | ORAL_TABLET | ORAL | Status: DC | PRN

## 2015-06-06 MED ORDER — ONDANSETRON 8 MG PO TBDP: 8.0000 mg | ORAL_TABLET | Freq: Three times a day (TID) | ORAL | Status: DC | PRN

## 2015-06-06 NOTE — ED Notes (Signed)
Pt reports pain in upper R hip/lower back area worse with movement. Pt bent to pick something up last night which worsened pain. Pt reports pain radiates down through front of leg. Denies flank pain or urinary symptoms. Denies any injuries.

## 2015-06-06 NOTE — ED Notes (Signed)
Patient transported to X-ray 

## 2015-06-06 NOTE — ED Provider Notes (Signed)
CSN: [id number]     Arrival date & time 06/06/15  0813 History   First MD Initiated Contact with Patient 06/06/15 [phone]     Chief Complaint  Patient presents with  . Back Pain     (Consider location/radiation/quality/duration/timing/severity/associated sxs/prior Treatment) HPI Comments: Patient here complaining of right hip and SI joint pain 1 week that became worse after she bent over last night. Denies any bowel or bladder dysfunction. Denies any perineal numbness. Pain is sharp and worse with standing or movement or sitting. Has used over-the-counter medications without relief. 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 Denies any urinary symptoms. No rashes noted  Patient is a 65 y.o. female presenting with back pain. The history is provided by the patient and the spouse.  Back Pain   Past Medical History  Diagnosis Date  . IBS (irritable bowel syndrome)   . Reflux   . Fundic gland polyps of stomach, benign   . Personal history of colonic polyps - adenoma 09/22/2013   Past Surgical History  Procedure Laterality Date  . Joint replacement Bilateral [id number]    hip  . Knee arthroscopy Left 1996  . Hip arthroscopy Left 1997  . Tonsillectomy and adenoidectomy  1957  . Colonoscopy    . Esophagogastroduodenoscopy     Family History  Problem Relation Age of Onset  . Deep vein thrombosis Mother   . Heart disease Mother     before age 66  . Hyperlipidemia Mother   . Hypertension Mother   . Other Mother     varicose veins  . Heart attack Mother   . Peripheral vascular disease Mother   . Bleeding Disorder Mother   . Deep vein thrombosis Father   . Heart disease Father     before age 46  . Hyperlipidemia Father   . Hypertension Father   . Heart attack Father   . Bleeding Disorder Father   . AAA (abdominal aortic aneurysm) Father   . Cancer Brother   . Hyperlipidemia Brother    Social History  Substance Use Topics  . Smoking status: Never Smoker    . Smokeless tobacco: Never Used  . Alcohol Use: No   OB History    No data available     Review of Systems  Musculoskeletal: Positive for back pain.  All other systems reviewed and are negative.     Allergies  Acetaminophen-codeine; Darvocet; Other; and Vioxx  Home Medications   Prior to Admission medications   Medication Sig Start Date End Date Taking? Authorizing Provider  Ascorbic Acid (VITAMIN C) 100 MG tablet Take 100 mg by mouth daily.    Historical Provider, MD  famotidine (PEPCID AC) 10 MG chewable tablet Chew 10 mg by mouth at bedtime as needed for heartburn.    Historical Provider, MD  Multiple Vitamins-Minerals (MULTIVITAMIN WITH MINERALS) tablet Take 1 tablet by mouth daily.    Historical Provider, MD   BP 149/74 mmHg  Pulse 67  Temp(Src) 98 F (36.7 C) (Oral)  Resp 16  SpO2 98% Physical Exam  Constitutional: She is oriented to person, place, and time. She appears well-developed and well-nourished.  Non-toxic appearance. No distress.  HENT:  Head: Normocephalic and atraumatic.  Eyes: Conjunctivae, EOM and lids are normal. Pupils are equal, round, and reactive to light.  Neck: Normal range of motion. Neck supple. No tracheal deviation present. No thyroid mass present.  Cardiovascular: Normal rate, regular rhythm and normal heart sounds.  Exam reveals no  gallop.   No murmur heard. Pulmonary/Chest: Effort normal and breath sounds normal. No stridor. No respiratory distress. She has no decreased breath sounds. She has no wheezes. She has no rhonchi. She has no rales.  Abdominal: Soft. Normal appearance and bowel sounds are normal. She exhibits no distension. There is no tenderness. There is no rebound and no CVA tenderness.  Musculoskeletal: Normal range of motion. She exhibits no edema or tenderness.       Back:  Neurological: She is alert and oriented to person, place, and time. She has normal strength. No cranial nerve deficit or sensory deficit. GCS eye  subscore is 4. GCS verbal subscore is 5. GCS motor subscore is 6.  Skin: Skin is warm and dry. No abrasion and no rash noted.  Psychiatric: She has a normal mood and affect. Her speech is normal and behavior is normal.  Nursing note and vitals reviewed.   ED Course  Procedures (including critical care time) Labs Review Labs Reviewed - No data to display  Imaging Review No results found. I have personally reviewed and evaluated these images and lab results as part of my medical decision-making.   EKG Interpretation None      MDM   Final diagnoses:  None    Patient given pain meds here and does flow better. X-ray urinalysis without acute findings. No evidence of patient having hematuria on urinalysis so doubt that she has a kidney stone. Patient to be referred back to her orthopedist    Lacretia Leigh, MD 06/06/15 1110

## 2015-06-06 NOTE — ED Notes (Signed)
Primary RN reports that the Pt was upset upon d/c.  Pt stated that she felt like she was being rushed, that the MD did not spend any time with her, and that she was going to give [place] a bad survey.  Sts she was not ready to leave and that we should have performed a MRI.  Primary RN apologized for her feeling rushed and attempted to explain that we typically do not order MRIs.  Pt remained unhappy.

## 2015-06-06 NOTE — ED Notes (Signed)
In/out cath due to pt inability to void on bedpan.  Pt has less pain than had previously, stating the spasms are not as bad but was not able to urinate on own.

## 2015-06-06 NOTE — Discharge Instructions (Signed)

## 2015-12-12 DIAGNOSIS — S83207A Unspecified tear of unspecified meniscus, current injury, left knee, initial encounter: Secondary | ICD-10-CM | POA: Diagnosis not present

## 2015-12-19 HISTORY — PX: KNEE ARTHROPLASTY: SHX992

## 2015-12-22 DIAGNOSIS — S83222A Peripheral tear of medial meniscus, current injury, left knee, initial encounter: Secondary | ICD-10-CM | POA: Diagnosis not present

## 2015-12-22 DIAGNOSIS — S83272D Complex tear of lateral meniscus, current injury, left knee, subsequent encounter: Secondary | ICD-10-CM | POA: Diagnosis not present

## 2015-12-22 DIAGNOSIS — S83272A Complex tear of lateral meniscus, current injury, left knee, initial encounter: Secondary | ICD-10-CM | POA: Diagnosis not present

## 2016-01-09 DIAGNOSIS — M25662 Stiffness of left knee, not elsewhere classified: Secondary | ICD-10-CM | POA: Diagnosis not present

## 2016-01-09 DIAGNOSIS — M25562 Pain in left knee: Secondary | ICD-10-CM | POA: Diagnosis not present

## 2016-01-11 DIAGNOSIS — L821 Other seborrheic keratosis: Secondary | ICD-10-CM | POA: Diagnosis not present

## 2016-01-11 DIAGNOSIS — L570 Actinic keratosis: Secondary | ICD-10-CM | POA: Diagnosis not present

## 2016-01-28 ENCOUNTER — Emergency Department (HOSPITAL_COMMUNITY)
Admission: EM | Admit: 2016-01-28 | Discharge: 2016-01-28 | Disposition: A | Discharge status: Home / Self Care | Payer: Medicare Other | Attending: Emergency Medicine | Admitting: Emergency Medicine

## 2016-01-28 ENCOUNTER — Encounter (HOSPITAL_COMMUNITY): Payer: Self-pay | Admitting: *Deleted

## 2016-01-28 ENCOUNTER — Emergency Department (HOSPITAL_COMMUNITY): Payer: Medicare Other

## 2016-01-28 DIAGNOSIS — Z96643 Presence of artificial hip joint, bilateral: Secondary | ICD-10-CM | POA: Diagnosis not present

## 2016-01-28 DIAGNOSIS — R1012 Left upper quadrant pain: Secondary | ICD-10-CM | POA: Diagnosis not present

## 2016-01-28 DIAGNOSIS — K760 Fatty (change of) liver, not elsewhere classified: Secondary | ICD-10-CM | POA: Diagnosis not present

## 2016-01-28 LAB — CBC
HCT: 40.2 % (ref 36.0–46.0)
Hemoglobin: 13 g/dL (ref 12.0–15.0)
MCH: 28.8 pg (ref 26.0–34.0)
MCHC: 32.3 g/dL (ref 30.0–36.0)
MCV: 88.9 fL (ref 78.0–100.0)
PLATELETS: 241 10*3/uL (ref 150–400)
RBC: 4.52 MIL/uL (ref 3.87–5.11)
RDW: 12.7 % (ref 11.5–15.5)
WBC: 11.2 10*3/uL — ABNORMAL HIGH (ref 4.0–10.5)

## 2016-01-28 LAB — COMPREHENSIVE METABOLIC PANEL
ALT: 12 U/L — AB (ref 14–54)
AST: 18 U/L (ref 15–41)
Albumin: 3.7 g/dL (ref 3.5–5.0)
Alkaline Phosphatase: 75 U/L (ref 38–126)
Anion gap: 8 (ref 5–15)
BILIRUBIN TOTAL: 0.5 mg/dL (ref 0.3–1.2)
BUN: 10 mg/dL (ref 6–20)
CALCIUM: 9.2 mg/dL (ref 8.9–10.3)
CO2: 24 mmol/L (ref 22–32)
CREATININE: 0.82 mg/dL (ref 0.44–1.00)
Chloride: 107 mmol/L (ref 101–111)
GFR calc Af Amer: >60 mL/min (ref >60)
Glucose, Bld: 119 mg/dL — ABNORMAL HIGH (ref 65–99)
Potassium: 3.7 mmol/L (ref 3.5–5.1)
Sodium: 139 mmol/L (ref 135–145)
TOTAL PROTEIN: 7.3 g/dL (ref 6.5–8.1)

## 2016-01-28 LAB — URINALYSIS, ROUTINE W REFLEX MICROSCOPIC
Bilirubin Urine: NEGATIVE
GLUCOSE, UA: NEGATIVE mg/dL
Hgb urine dipstick: NEGATIVE
KETONES UR: NEGATIVE mg/dL
Leukocytes, UA: NEGATIVE
NITRITE: NEGATIVE
PROTEIN: NEGATIVE mg/dL
Specific Gravity, Urine: 1.017 (ref 1.005–1.030)
pH: 6 (ref 5.0–8.0)

## 2016-01-28 LAB — LIPASE, BLOOD: Lipase: 30 U/L (ref 11–51)

## 2016-01-28 MED ORDER — ONDANSETRON 4 MG PO TBDP
8.0000 mg | ORAL_TABLET | Freq: Once | ORAL | Status: AC
  Administered 2016-01-28: 8 mg via ORAL
  Filled 2016-01-28: qty 2

## 2016-01-28 MED ORDER — ONDANSETRON 8 MG PO TBDP: 8.0000 mg | ORAL_TABLET | Freq: Three times a day (TID) | ORAL | 0 refills | Status: DC | PRN

## 2016-01-28 NOTE — Discharge Instructions (Signed)

## 2016-01-28 NOTE — ED Triage Notes (Signed)
The pt is c/o lt upper abd pain with n vomiting last pm   The pain intermittent  Stabbing pain    Diarrhea this am and this am

## 2016-01-28 NOTE — ED Provider Notes (Signed)
[facility] DEPT Provider Note   CSN: [id number] Arrival date & time: 01/28/16  0044  By signing my name below, I, Gwenlyn Fudge, attest that this documentation has been prepared under the direction and in the presence of Ripley Fraise, MD. Electronically Signed: Gwenlyn Fudge, ED Scribe. 01/28/16. 2:42 AM.   History   Chief Complaint Chief Complaint  Patient presents with  . Abdominal Pain   The history is provided by the patient. No language interpreter was used.  Abdominal Pain   This is a new problem. The current episode started 2 days ago. The problem occurs daily. The problem has not changed since onset.The pain is associated with an unknown factor. The pain is located in the LUQ. The quality of the pain is sharp. The pain is moderate. Associated symptoms include diarrhea, nausea and vomiting. Pertinent negatives include fever and dysuria. Nothing aggravates the symptoms. Nothing relieves the symptoms. Her past medical history is significant for GERD and irritable bowel syndrome.    HPI Comments: Woke up Friday night with nausea, vomiting and diarrhea. Had a routine day yesterday, but last night felt nauseated. Woke up again after this evening and had another vomiting episode. She denies abdominal surgery. Pt as had 3 episodes of watery diarrhea yesterday. She states she had 1 episode of loose diarrhea today.   Past Medical History:  Diagnosis Date  . Fundic gland polyps of stomach, benign   . IBS (irritable bowel syndrome)   . Personal history of colonic polyps - adenoma 09/22/2013  . Reflux     Patient Active Problem List   Diagnosis Date Noted  . Hearing loss 04/22/2014  . Varicose veins of lower extremities with other complications [id number]  . Routine health maintenance 03/01/2012  . Hyperlipidemia 02/16/2007  . GERD 02/16/2007    Past Surgical History:  Procedure Laterality Date  . COLONOSCOPY    . ESOPHAGOGASTRODUODENOSCOPY    . HIP ARTHROSCOPY Left 1997  .  JOINT REPLACEMENT Bilateral [id number]   hip  . KNEE ARTHROSCOPY Left 1996  . TONSILLECTOMY AND ADENOIDECTOMY  1957    OB History    No data available       Home Medications    Prior to Admission medications   Medication Sig Start Date End Date Taking? Authorizing Provider  acetaminophen (TYLENOL) 500 MG tablet Take 1,000 mg by mouth every 6 (six) hours as needed for moderate pain.    Historical Provider, MD  Ascorbic Acid (VITAMIN C) 100 MG tablet Take 100 mg by mouth daily.    Historical Provider, MD  diazepam (VALIUM) 2 MG tablet Take 1 tablet (2 mg total) by mouth every 6 (six) hours as needed for muscle spasms. 06/06/15   Lacretia Leigh, MD  famotidine (PEPCID AC) 10 MG chewable tablet Chew 10 mg by mouth at bedtime as needed for heartburn.    Historical Provider, MD  HYDROcodone-acetaminophen (NORCO/VICODIN) 5-325 MG tablet Take 1-2 tablets by mouth every 4 (four) hours as needed. 06/06/15   Lacretia Leigh, MD  Multiple Vitamins-Minerals (MULTIVITAMIN WITH MINERALS) tablet Take 1 tablet by mouth daily.    Historical Provider, MD  ondansetron (ZOFRAN ODT) 8 MG disintegrating tablet Take 1 tablet (8 mg total) by mouth every 8 (eight) hours as needed for nausea or vomiting. 06/06/15   Lacretia Leigh, MD  predniSONE (DELTASONE) 20 MG tablet Take 2 tablets (40 mg total) by mouth daily. 06/06/15   Lacretia Leigh, MD    Family History Family History  Problem Relation Age of Onset  .  Deep vein thrombosis Mother   . Heart disease Mother     before age 38  . Hyperlipidemia Mother   . Hypertension Mother   . Other Mother     varicose veins  . Heart attack Mother   . Peripheral vascular disease Mother   . Bleeding Disorder Mother   . Deep vein thrombosis Father   . Heart disease Father     before age 6  . Hyperlipidemia Father   . Hypertension Father   . Heart attack Father   . Bleeding Disorder Father   . AAA (abdominal aortic aneurysm) Father   . Cancer Brother   . Hyperlipidemia  Brother     Social History Social History  Substance Use Topics  . Smoking status: Never Smoker  . Smokeless tobacco: Never Used  . Alcohol use No     Allergies   Acetaminophen-codeine; Darvocet [propoxyphene n-acetaminophen]; and Vioxx [rofecoxib]  Review of Systems Review of Systems  Constitutional: Positive for chills. Negative for fever.  Respiratory: Negative for cough.   Cardiovascular: Negative for chest pain.  Gastrointestinal: Positive for abdominal pain, diarrhea, nausea and vomiting.       - Hematemesis  Genitourinary: Negative for dysuria.  Musculoskeletal: Positive for back pain.  Neurological: Negative for weakness and numbness.  All other systems reviewed and are negative.  Physical Exam Updated Vital Signs BP 139/71   Pulse 70   Temp 98.4 F (36.9 C)   Resp 18   Ht 5\' 11"  (1.803 m)   Wt 192 lb (87.1 kg)   SpO2 98%   BMI 26.78 kg/m   Physical Exam CONSTITUTIONAL: Well developed/well nourished HEAD: Normocephalic/atraumatic EYES: EOMI/PERRL ENMT: Mucous membranes moist NECK: supple no meningeal signs SPINE/BACK:entire spine nontender CV: S1/S2 noted, no murmurs/rubs/gallops noted LUNGS: Lungs are clear to auscultation bilaterally, no apparent distress ABDOMEN: soft, mild LUQ tenderness, no rebound or guarding, bowel sounds noted throughout abdomen GU:no cva tenderness NEURO: Pt is awake/alert/appropriate, moves all extremitiesx4.  No facial droop.   EXTREMITIES: pulses normal/equal, full ROM SKIN: warm, color normal PSYCH: no abnormalities of mood noted, alert and oriented to situation  ED Treatments / Results  DIAGNOSTIC STUDIES: Oxygen Saturation is 98% on RA, normal by my interpretation.    COORDINATION OF CARE: 2:32 AM Discussed treatment plan with pt at bedside which includes Urinalysis, Zofran and observation and pt agreed to plan.  Labs (all labs ordered are listed, but only abnormal results are displayed) Labs Reviewed    COMPREHENSIVE METABOLIC PANEL - Abnormal; Notable for the following:       Result Value   Glucose, Bld 119 (*)    ALT 12 (*)    All other components within normal limits  CBC - Abnormal; Notable for the following:    WBC 11.2 (*)    All other components within normal limits  LIPASE, BLOOD  URINALYSIS, ROUTINE W REFLEX MICROSCOPIC (NOT AT [facility])    EKG  EKG Interpretation  Date/Time:  Sunday January 28 2016 00:49:19 EDT Ventricular Rate:  72 PR Interval:  136 QRS Duration: 94 QT Interval:  414 QTC Calculation: 453 R Axis:   57 Text Interpretation:  Normal sinus rhythm Low voltage QRS Borderline ECG No previous ECGs available Confirmed by Christy Gentles  MD, [place] ([id number]) on 01/28/2016 2:32:27 AM       Radiology US Abdomen Complete  Result Date: 01/28/2016 CLINICAL DATA:  Acute onset of nausea, vomiting and diarrhea. Left upper quadrant abdominal pain. Initial encounter. EXAM: ABDOMEN ULTRASOUND COMPLETE  COMPARISON:  Abdominal ultrasound performed 10/20/2009 FINDINGS: Gallbladder: No gallstones or wall thickening visualized. A fold is noted near the gallbladder fundus. No sonographic Murphy sign noted by sonographer. Common bile duct: Diameter: 0.3 cm, within normal limits in caliber Liver: No focal lesion identified. Increased parenchymal echogenicity raises concern for fatty infiltration. Left hepatic lobe not well seen due to overlying bowel gas. IVC: No abnormality visualized. Pancreas: Visualized portion unremarkable. Spleen: Size and appearance within normal limits. Right Kidney: Length: 10.2 cm. Echogenicity within normal limits. A small 1.6 cm cyst at the interpole region of the right kidney. No hydronephrosis visualized. Left Kidney: Length: 11.0 cm. Echogenicity within normal limits. No mass or hydronephrosis visualized. Abdominal aorta: No aneurysm visualized. Not well seen proximally due to overlying bowel gas. Other findings: None. IMPRESSION: 1. No acute abnormality seen within  the abdomen. 2. Fatty infiltration within the liver. 3. Small right renal cyst seen. Electronically Signed   By: Garald Balding M.D.   On: 01/28/2016 06:14    Procedures Procedures (including critical care time)  Medications Ordered in ED Medications - No data to display   Initial Impression / Assessment and Plan / ED Course  I have reviewed the triage vital signs and the nursing notes.  Pertinent labs results that were available during my care of the patient were reviewed by me and considered in my medical decision making (see chart for details).  Clinical Course    Pt with intermittent episodes of abd pain, mostly in the LUQ region She is nontoxic in appearance No vomiting here Due to persistence of pain, imaging/labs ordered and essentially negative Suspect this is due to recent vomiting/diarrhea illness Will d/c home We discussed return precautions   Final Clinical Impressions(s) / ED Diagnoses   Final diagnoses:  Left upper quadrant pain    New Prescriptions New Prescriptions   ONDANSETRON (ZOFRAN ODT) 8 MG DISINTEGRATING TABLET    Take 1 tablet (8 mg total) by mouth every 8 (eight) hours as needed.   I personally performed the services described in this documentation, which was scribed in my presence. The recorded information has been reviewed and is accurate.        Ripley Fraise, MD 01/28/16 [phone]

## 2016-02-06 DIAGNOSIS — M1711 Unilateral primary osteoarthritis, right knee: Secondary | ICD-10-CM | POA: Diagnosis not present

## 2016-02-13 DIAGNOSIS — Z124 Encounter for screening for malignant neoplasm of cervix: Secondary | ICD-10-CM | POA: Diagnosis not present

## 2016-02-13 DIAGNOSIS — Z1231 Encounter for screening mammogram for malignant neoplasm of breast: Secondary | ICD-10-CM | POA: Diagnosis not present

## 2016-02-13 DIAGNOSIS — Z6828 Body mass index (BMI) 28.0-28.9, adult: Secondary | ICD-10-CM | POA: Diagnosis not present

## 2016-02-13 DIAGNOSIS — Z01419 Encounter for gynecological examination (general) (routine) without abnormal findings: Secondary | ICD-10-CM | POA: Diagnosis not present

## 2016-02-27 DIAGNOSIS — M1712 Unilateral primary osteoarthritis, left knee: Secondary | ICD-10-CM | POA: Diagnosis not present

## 2016-04-04 DIAGNOSIS — S83206A Unspecified tear of unspecified meniscus, current injury, right knee, initial encounter: Secondary | ICD-10-CM | POA: Diagnosis not present

## 2016-04-04 DIAGNOSIS — M1712 Unilateral primary osteoarthritis, left knee: Secondary | ICD-10-CM | POA: Diagnosis not present

## 2016-04-10 DIAGNOSIS — M25561 Pain in right knee: Secondary | ICD-10-CM | POA: Diagnosis not present

## 2016-04-18 DIAGNOSIS — S83206A Unspecified tear of unspecified meniscus, current injury, right knee, initial encounter: Secondary | ICD-10-CM | POA: Diagnosis not present

## 2016-04-18 DIAGNOSIS — M1711 Unilateral primary osteoarthritis, right knee: Secondary | ICD-10-CM | POA: Diagnosis not present

## 2016-04-18 DIAGNOSIS — M1712 Unilateral primary osteoarthritis, left knee: Secondary | ICD-10-CM | POA: Diagnosis not present

## 2016-04-22 ENCOUNTER — Encounter: Payer: Self-pay | Admitting: Internal Medicine

## 2016-04-22 ENCOUNTER — Other Ambulatory Visit (INDEPENDENT_AMBULATORY_CARE_PROVIDER_SITE_OTHER): Payer: Medicare Other

## 2016-04-22 ENCOUNTER — Ambulatory Visit (INDEPENDENT_AMBULATORY_CARE_PROVIDER_SITE_OTHER): Payer: Medicare Other | Admitting: Internal Medicine

## 2016-04-22 VITALS — BP 114/72 | HR 56 | Temp 97.9°F | Resp 14 | Ht 71.0 in | Wt 192.0 lb

## 2016-04-22 DIAGNOSIS — Z1159 Encounter for screening for other viral diseases: Secondary | ICD-10-CM | POA: Diagnosis not present

## 2016-04-22 DIAGNOSIS — Z01818 Encounter for other preprocedural examination: Secondary | ICD-10-CM

## 2016-04-22 DIAGNOSIS — Z01810 Encounter for preprocedural cardiovascular examination: Secondary | ICD-10-CM | POA: Diagnosis not present

## 2016-04-22 DIAGNOSIS — Z0000 Encounter for general adult medical examination without abnormal findings: Secondary | ICD-10-CM | POA: Insufficient documentation

## 2016-04-22 LAB — CBC
HEMATOCRIT: 39.7 % (ref 36.0–46.0)
Hemoglobin: 13.3 g/dL (ref 12.0–15.0)
MCHC: 33.6 g/dL (ref 30.0–36.0)
MCV: 85.3 fl (ref 78.0–100.0)
PLATELETS: 277 10*3/uL (ref 150.0–400.0)
RBC: 4.66 Mil/uL (ref 3.87–5.11)
RDW: 13.6 % (ref 11.5–15.5)
WBC: 10.6 10*3/uL — AB (ref 4.0–10.5)

## 2016-04-22 LAB — HEPATITIS C ANTIBODY: HCV Ab: NEGATIVE

## 2016-04-22 LAB — COMPREHENSIVE METABOLIC PANEL
ALBUMIN: 3.9 g/dL (ref 3.5–5.2)
ALT: 9 U/L (ref 0–35)
AST: 8 U/L (ref 0–37)
Alkaline Phosphatase: 64 U/L (ref 39–117)
BUN: 18 mg/dL (ref 6–23)
CALCIUM: 9.4 mg/dL (ref 8.4–10.5)
CHLORIDE: 103 meq/L (ref 96–112)
CO2: 28 meq/L (ref 19–32)
Creatinine, Ser: 0.88 mg/dL (ref 0.40–1.20)
GFR: 68.47 mL/min (ref >60.00)
Glucose, Bld: 91 mg/dL (ref 70–99)
POTASSIUM: 4.4 meq/L (ref 3.5–5.1)
Sodium: 138 mEq/L (ref 135–145)
Total Bilirubin: 0.3 mg/dL (ref 0.2–1.2)
Total Protein: 7.1 g/dL (ref 6.0–8.3)

## 2016-04-22 LAB — LIPID PANEL
CHOL/HDL RATIO: 4
CHOLESTEROL: 233 mg/dL — AB (ref 0–200)
HDL: 65.4 mg/dL (ref >39.00)
LDL CALC: 148 mg/dL — AB (ref 0–99)
NonHDL: 167.76
Triglycerides: 98 mg/dL (ref 0.0–149.0)
VLDL: 19.6 mg/dL (ref 0.0–40.0)

## 2016-04-22 NOTE — Patient Instructions (Signed)
We are checking the labs today and will call you back with the results.   We have given you the surgical clearance letter today.

## 2016-04-22 NOTE — Progress Notes (Signed)
Pre visit review using our clinic review tool, if applicable. No additional management support is needed unless otherwise documented below in the visit note. 

## 2016-04-22 NOTE — Assessment & Plan Note (Addendum)
EKG reviewed which is normal, no chest pains. No pertinent medical history and no chronic medications. Needs labs, checking CBC, CMP, lipid panel today and if normal okay to proceed to total knee replacement without further testing. Advised that flu shot is recommended and she declines today. Non-smoker.

## 2016-04-22 NOTE — Progress Notes (Signed)
   Subjective:    Patient ID: Jennifer Frank, female    DOB: 12-Jun-1950, 65 y.o.   MRN: [id number]  HPI The patient is a 65 YO female coming in for surgery clearance. She is having left total knee replacement and right knee arthroscopy. Denies chest pains or SOB. Exercise is limited due to pain recently. Recent EKG at the ER for stomach problems which are now resolved. No significant PMH. She has had prior hip replacement. Able to climb a flight of stairs without chest pain or SOB. Walks daily but limited due to knee pain. No new allergies and takes no medications daily.   PMH, [facility], social history reviewed and updated.   Review of Systems  Constitutional: Positive for activity change. Negative for appetite change, chills, fatigue, fever and unexpected weight change.  HENT: Negative.   Eyes: Negative.   Respiratory: Negative.   Cardiovascular: Negative.   Gastrointestinal: Negative.   Musculoskeletal: Positive for arthralgias. Negative for back pain, gait problem, myalgias, neck pain and neck stiffness.  Skin: Negative.   Neurological: Negative.       Objective:   Physical Exam  Constitutional: She is oriented to person, place, and time. She appears well-developed and well-nourished.  HENT:  Head: Normocephalic and atraumatic.  Eyes: EOM are normal.  Neck: Normal range of motion.  Cardiovascular: Normal rate and regular rhythm.   No murmur heard. Carotids without bruit bilaterally.  Pulmonary/Chest: Effort normal and breath sounds normal. No respiratory distress. She has no wheezes. She has no rales.  Abdominal: Soft. Bowel sounds are normal. She exhibits no distension. There is no tenderness. There is no rebound.  Musculoskeletal: She exhibits no edema.  Neurological: She is alert and oriented to person, place, and time.  Skin: Skin is warm and dry.  Psychiatric: She has a normal mood and affect.   Vitals:   04/22/16 1028  BP: 114/72  Pulse: (!) 56  Resp: 14  Temp: 97.9 F  (36.6 C)  TempSrc: Oral  SpO2: 99%  Weight: 192 lb (87.1 kg)  Height: 5\' 11"  (1.803 m)      Assessment & Plan:

## 2016-04-23 ENCOUNTER — Telehealth: Payer: Self-pay | Admitting: Internal Medicine

## 2016-04-23 NOTE — Telephone Encounter (Signed)
Left message for patient to call back to discuss lab results. 

## 2016-04-23 NOTE — Telephone Encounter (Signed)
Called and spoke with patient about lab results.  

## 2016-04-23 NOTE — Telephone Encounter (Signed)
Patient is requesting that you call her back regarding her labs. She declined me reading to her.

## 2016-04-23 NOTE — Telephone Encounter (Signed)
Please call patient @ mobile

## 2016-05-01 ENCOUNTER — Other Ambulatory Visit: Payer: Self-pay | Admitting: Orthopedic Surgery

## 2016-05-15 NOTE — Pre-Procedure Instructions (Addendum)
    Jennifer Frank  05/15/2016      [facility], [place] [address] Phone: [phone] Fax: [phone]  [facility], [place] [address] Phone: [phone] Fax: [phone]    Your procedure is scheduled on Mon., Jan 8  Report to [facility] Admitting at 11:00 A.M.  Call this number if you have problems the morning of surgery:  [phone]   Remember:  Do not eat food or drink liquids after midnight on Sun., Jan 7   Take these medicines the morning of surgery with A SIP OF WATER : tylenol if needed              1 week prior to surgery stop: advil, motrin, ibuprofen, aleve, BC Powders, goody's, vitamin/herbal medicines.   Do not wear jewelry, make-up or nail polish.  Do not wear lotions, powders, or perfumes, or deoderant.  Do not shave 48 hours prior to surgery.  Men may shave face and neck.  Do not bring valuables to the hospital.  [facility] is not responsible for any belongings or valuables.  Contacts, dentures or bridgework may not be worn into surgery.  Leave your suitcase in the car.  After surgery it may be brought to your room.  For patients admitted to the hospital, discharge time will be determined by your treatment team.  Patients discharged the day of surgery will not be allowed to drive home.    Special instructions:  Review preparing for surgery handout  Please read over the following fact sheets that you were given. Coughing and Deep Breathing and MRSA Information

## 2016-05-16 ENCOUNTER — Encounter (HOSPITAL_COMMUNITY)
Admission: RE | Admit: 2016-05-16 | Discharge: 2016-05-16 | Disposition: A | Discharge status: Home / Self Care | Payer: Medicare Other | Source: Ambulatory Visit | Attending: Orthopedic Surgery | Admitting: Orthopedic Surgery

## 2016-05-16 ENCOUNTER — Encounter (HOSPITAL_COMMUNITY): Payer: Self-pay

## 2016-05-16 DIAGNOSIS — Z01812 Encounter for preprocedural laboratory examination: Secondary | ICD-10-CM | POA: Diagnosis not present

## 2016-05-16 DIAGNOSIS — M1712 Unilateral primary osteoarthritis, left knee: Secondary | ICD-10-CM | POA: Diagnosis not present

## 2016-05-16 DIAGNOSIS — M9420 Chondromalacia, unspecified site: Secondary | ICD-10-CM | POA: Insufficient documentation

## 2016-05-16 DIAGNOSIS — Z0183 Encounter for blood typing: Secondary | ICD-10-CM | POA: Insufficient documentation

## 2016-05-16 DIAGNOSIS — S83281A Other tear of lateral meniscus, current injury, right knee, initial encounter: Secondary | ICD-10-CM | POA: Insufficient documentation

## 2016-05-16 DIAGNOSIS — Z01818 Encounter for other preprocedural examination: Secondary | ICD-10-CM

## 2016-05-16 DIAGNOSIS — X58XXXA Exposure to other specified factors, initial encounter: Secondary | ICD-10-CM | POA: Insufficient documentation

## 2016-05-16 HISTORY — DX: Nausea with vomiting, unspecified: R11.2

## 2016-05-16 HISTORY — DX: Other specified postprocedural states: Z98.890

## 2016-05-16 HISTORY — DX: Gastro-esophageal reflux disease without esophagitis: K21.9

## 2016-05-16 HISTORY — DX: Other specified postprocedural states: R11.2

## 2016-05-16 LAB — CBC WITH DIFFERENTIAL/PLATELET
BASOS PCT: 0 %
Basophils Absolute: 0 10*3/uL (ref 0.0–0.1)
EOS ABS: 0.3 10*3/uL (ref 0.0–0.7)
Eosinophils Relative: 3 %
HCT: 39.6 % (ref 36.0–46.0)
Hemoglobin: 13.1 g/dL (ref 12.0–15.0)
LYMPHS ABS: 1.9 10*3/uL (ref 0.7–4.0)
Lymphocytes Relative: 20 %
MCH: 29.2 pg (ref 26.0–34.0)
MCHC: 33.1 g/dL (ref 30.0–36.0)
MCV: 88.4 fL (ref 78.0–100.0)
MONOS PCT: 5 %
Monocytes Absolute: 0.5 10*3/uL (ref 0.1–1.0)
NEUTROS ABS: 7.1 10*3/uL (ref 1.7–7.7)
NEUTROS PCT: 72 %
PLATELETS: 300 10*3/uL (ref 150–400)
RBC: 4.48 MIL/uL (ref 3.87–5.11)
RDW: 13 % (ref 11.5–15.5)
WBC: 9.8 10*3/uL (ref 4.0–10.5)

## 2016-05-16 LAB — URINALYSIS, ROUTINE W REFLEX MICROSCOPIC
BILIRUBIN URINE: NEGATIVE
GLUCOSE, UA: NEGATIVE mg/dL
HGB URINE DIPSTICK: NEGATIVE
KETONES UR: NEGATIVE mg/dL
Nitrite: NEGATIVE
PROTEIN: NEGATIVE mg/dL
Specific Gravity, Urine: 1.02 (ref 1.005–1.030)
pH: 5 (ref 5.0–8.0)

## 2016-05-16 LAB — PROTIME-INR
INR: 0.97
Prothrombin Time: 12.9 seconds (ref 11.4–15.2)

## 2016-05-16 LAB — BASIC METABOLIC PANEL
ANION GAP: 9 (ref 5–15)
BUN: 15 mg/dL (ref 6–20)
CHLORIDE: 103 mmol/L (ref 101–111)
CO2: 29 mmol/L (ref 22–32)
Calcium: 9.9 mg/dL (ref 8.9–10.3)
Creatinine, Ser: 0.85 mg/dL (ref 0.44–1.00)
GFR calc non Af Amer: >60 mL/min (ref >60)
Glucose, Bld: 91 mg/dL (ref 65–99)
POTASSIUM: 4.2 mmol/L (ref 3.5–5.1)
SODIUM: 141 mmol/L (ref 135–145)

## 2016-05-16 LAB — SURGICAL PCR SCREEN
MRSA, PCR: NEGATIVE
Staphylococcus aureus: NEGATIVE

## 2016-05-16 LAB — TYPE AND SCREEN
ABO/RH(D): O POS
Antibody Screen: NEGATIVE

## 2016-05-16 LAB — ABO/RH: ABO/RH(D): O POS

## 2016-05-16 LAB — APTT: aPTT: 30 seconds (ref 24–36)

## 2016-05-16 NOTE — Progress Notes (Signed)
PCP:Dr. Pricilla Holm

## 2016-05-23 NOTE — H&P (Signed)
TOTAL KNEE ADMISSION H&P  Patient is being admitted for left total knee arthroplasty.  Subjective:  Chief Complaint:left knee pain.  HPI: Jennifer Frank, 66 y.o. female, has a history of pain and functional disability in the left knee due to arthritis and has failed non-surgical conservative treatments for greater than 12 weeks to includeNSAID's and/or analgesics, corticosteriod injections, flexibility and strengthening excercises, supervised PT with diminished ADL's post treatment, weight reduction as appropriate and activity modification.  Onset of symptoms was abrupt, starting 1 years ago with rapidlly worsening course since that time. The patient noted prior procedures on the knee to include  arthroscopy on the left knee(s).  Patient currently rates pain in the left knee(s) at 10 out of 10 with activity. Patient has night pain, worsening of pain with activity and weight bearing, pain that interferes with activities of daily living, pain with passive range of motion, crepitus and joint swelling.  Patient has evidence of joint space narrowing by imaging studies.   There is no active infection.  Patient Active Problem List   Diagnosis Date Noted  . Pre-operative cardiovascular examination 04/22/2016  . Hearing loss 04/22/2014  . Varicose veins of lower extremities with other complications [id number]  . Routine health maintenance 03/01/2012  . Hyperlipidemia 02/16/2007  . GERD 02/16/2007   Past Medical History:  Diagnosis Date  . Fundic gland polyps of stomach, benign   . GERD (gastroesophageal reflux disease)   . IBS (irritable bowel syndrome)   . Personal history of colonic polyps - adenoma 09/22/2013  . PONV (postoperative nausea and vomiting)   . Reflux     Past Surgical History:  Procedure Laterality Date  . COLONOSCOPY    . ESOPHAGOGASTRODUODENOSCOPY    . HIP ARTHROSCOPY Left 1997  . JOINT REPLACEMENT Bilateral [id number]   hip  . KNEE ARTHROPLASTY Left 12/2015  . KNEE  ARTHROSCOPY Left 1996  . TONSILLECTOMY AND ADENOIDECTOMY  1957    No prescriptions prior to admission.   Allergies  Allergen Reactions  . Acetaminophen-Codeine Nausea And Vomiting  . Darvocet [Propoxyphene N-Acetaminophen] Nausea And Vomiting  . Morphine And Related Nausea And Vomiting  . Vioxx [Rofecoxib] Hives and Swelling    Social History  Substance Use Topics  . Smoking status: Never Smoker  . Smokeless tobacco: Never Used  . Alcohol use 0.6 oz/week    1 Glasses of wine per week     Comment: rarely    Family History  Problem Relation Age of Onset  . Deep vein thrombosis Mother   . Heart disease Mother     before age 61  . Hyperlipidemia Mother   . Hypertension Mother   . Other Mother     varicose veins  . Heart attack Mother   . Peripheral vascular disease Mother   . Bleeding Disorder Mother   . Deep vein thrombosis Father   . Heart disease Father     before age 37  . Hyperlipidemia Father   . Hypertension Father   . Heart attack Father   . Bleeding Disorder Father   . AAA (abdominal aortic aneurysm) Father   . Cancer Brother   . Hyperlipidemia Brother      Review of Systems  Constitutional: Negative.   HENT: Negative.   Eyes: Negative.   Respiratory: Negative.   Cardiovascular: Positive for leg swelling.  Gastrointestinal: Negative.   Genitourinary: Negative.   Musculoskeletal: Positive for joint pain and myalgias.  Skin: Negative.   Neurological: Negative.   Endo/Heme/Allergies: Negative.  Psychiatric/Behavioral: Negative.     Objective:  Physical Exam  Constitutional: She is oriented to person, place, and time. She appears well-developed and well-nourished.  HENT:  Head: Normocephalic and atraumatic.  Eyes: Pupils are equal, round, and reactive to light.  Neck: Normal range of motion. Neck supple.  Cardiovascular: Intact distal pulses.   Respiratory: Effort normal.  Musculoskeletal: She exhibits tenderness.  Tender along the lateral joint  line of the right knee range of motion is from 5-125 collateral ligaments are stable Lachman's test is negative.  McMurray's test reproduces pain without a click.  The left knee has a 3+ effusion range of motion.  There is 10/120, tender along the lateral and medial joint lines.    Neurological: She is alert and oriented to person, place, and time.  Skin: Skin is warm and dry.  Psychiatric: She has a normal mood and affect. Her behavior is normal. Judgment and thought content normal.    Vital signs in last 24 hours:    Labs:   Estimated body mass index is 26.78 kg/m as calculated from the following:   Height as of 05/16/16: 5\' 11"  (1.803 m).   Weight as of 05/16/16: 87.1 kg (192 lb).   Imaging Review Plain radiographs demonstrate  standing AP and Lutricia Feil views show near complete loss of articular cartilage the lateral side of the left knee, compared x-rays that were done in March of this year.  She is lost about 3/4 of the articular cartilage laterally.  Assessment/Plan:  End stage arthritis, left knee   The patient history, physical examination, clinical judgment of the provider and imaging studies are consistent with end stage degenerative joint disease of the left knee(s) and total knee arthroplasty is deemed medically necessary. The treatment options including medical management, injection therapy arthroscopy and arthroplasty were discussed at length. The risks and benefits of total knee arthroplasty were presented and reviewed. The risks due to aseptic loosening, infection, stiffness, patella tracking problems, thromboembolic complications and other imponderables were discussed. The patient acknowledged the explanation, agreed to proceed with the plan and consent was signed. Patient is being admitted for inpatient treatment for surgery, pain control, PT, OT, prophylactic antibiotics, VTE prophylaxis, progressive ambulation and ADL's and discharge planning. The patient is planning to  be discharged home with home health services

## 2016-05-26 DIAGNOSIS — M1712 Unilateral primary osteoarthritis, left knee: Secondary | ICD-10-CM | POA: Diagnosis present

## 2016-05-27 ENCOUNTER — Inpatient Hospital Stay (HOSPITAL_COMMUNITY): Payer: Medicare Other | Admitting: Certified Registered Nurse Anesthetist

## 2016-05-27 ENCOUNTER — Inpatient Hospital Stay (HOSPITAL_COMMUNITY)
Admission: RE | Admit: 2016-05-27 | Discharge: 2016-05-30 | DRG: 470 | Disposition: A | Discharge status: Skilled Nursing Facility | Payer: Medicare Other | Source: Ambulatory Visit | Attending: Orthopedic Surgery | Admitting: Orthopedic Surgery

## 2016-05-27 ENCOUNTER — Encounter (HOSPITAL_COMMUNITY): Payer: Self-pay | Admitting: *Deleted

## 2016-05-27 ENCOUNTER — Encounter (HOSPITAL_COMMUNITY)
Admission: RE | Disposition: A | Discharge status: Skilled Nursing Facility | Payer: Self-pay | Source: Ambulatory Visit | Attending: Orthopedic Surgery

## 2016-05-27 DIAGNOSIS — D62 Acute posthemorrhagic anemia: Secondary | ICD-10-CM | POA: Diagnosis not present

## 2016-05-27 DIAGNOSIS — M1711 Unilateral primary osteoarthritis, right knee: Secondary | ICD-10-CM | POA: Diagnosis not present

## 2016-05-27 DIAGNOSIS — Z885 Allergy status to narcotic agent status: Secondary | ICD-10-CM

## 2016-05-27 DIAGNOSIS — E785 Hyperlipidemia, unspecified: Secondary | ICD-10-CM | POA: Diagnosis not present

## 2016-05-27 DIAGNOSIS — M1712 Unilateral primary osteoarthritis, left knee: Secondary | ICD-10-CM | POA: Diagnosis not present

## 2016-05-27 DIAGNOSIS — M25569 Pain in unspecified knee: Secondary | ICD-10-CM | POA: Diagnosis not present

## 2016-05-27 DIAGNOSIS — K219 Gastro-esophageal reflux disease without esophagitis: Secondary | ICD-10-CM | POA: Diagnosis not present

## 2016-05-27 DIAGNOSIS — H9190 Unspecified hearing loss, unspecified ear: Secondary | ICD-10-CM | POA: Diagnosis present

## 2016-05-27 DIAGNOSIS — M23251 Derangement of posterior horn of lateral meniscus due to old tear or injury, right knee: Secondary | ICD-10-CM | POA: Diagnosis present

## 2016-05-27 DIAGNOSIS — M25562 Pain in left knee: Secondary | ICD-10-CM | POA: Diagnosis present

## 2016-05-27 DIAGNOSIS — S83281A Other tear of lateral meniscus, current injury, right knee, initial encounter: Secondary | ICD-10-CM | POA: Diagnosis not present

## 2016-05-27 DIAGNOSIS — Z96643 Presence of artificial hip joint, bilateral: Secondary | ICD-10-CM | POA: Diagnosis present

## 2016-05-27 DIAGNOSIS — M23341 Other meniscus derangements, anterior horn of lateral meniscus, right knee: Secondary | ICD-10-CM | POA: Diagnosis not present

## 2016-05-27 DIAGNOSIS — Z832 Family history of diseases of the blood and blood-forming organs and certain disorders involving the immune mechanism: Secondary | ICD-10-CM

## 2016-05-27 DIAGNOSIS — Z96651 Presence of right artificial knee joint: Secondary | ICD-10-CM | POA: Diagnosis not present

## 2016-05-27 DIAGNOSIS — Z86010 Personal history of colonic polyps: Secondary | ICD-10-CM | POA: Diagnosis not present

## 2016-05-27 DIAGNOSIS — Z8249 Family history of ischemic heart disease and other diseases of the circulatory system: Secondary | ICD-10-CM

## 2016-05-27 DIAGNOSIS — G8918 Other acute postprocedural pain: Secondary | ICD-10-CM | POA: Diagnosis not present

## 2016-05-27 DIAGNOSIS — I739 Peripheral vascular disease, unspecified: Secondary | ICD-10-CM | POA: Diagnosis present

## 2016-05-27 DIAGNOSIS — Z886 Allergy status to analgesic agent status: Secondary | ICD-10-CM | POA: Diagnosis not present

## 2016-05-27 DIAGNOSIS — K317 Polyp of stomach and duodenum: Secondary | ICD-10-CM | POA: Diagnosis present

## 2016-05-27 DIAGNOSIS — Z79899 Other long term (current) drug therapy: Secondary | ICD-10-CM | POA: Diagnosis not present

## 2016-05-27 DIAGNOSIS — M94261 Chondromalacia, right knee: Secondary | ICD-10-CM | POA: Diagnosis not present

## 2016-05-27 DIAGNOSIS — M2241 Chondromalacia patellae, right knee: Secondary | ICD-10-CM | POA: Diagnosis present

## 2016-05-27 DIAGNOSIS — R531 Weakness: Secondary | ICD-10-CM | POA: Diagnosis not present

## 2016-05-27 HISTORY — PX: TOTAL KNEE ARTHROPLASTY: SHX125

## 2016-05-27 SURGERY — ARTHROPLASTY, KNEE, TOTAL
Anesthesia: Spinal | Site: Knee | Laterality: Bilateral

## 2016-05-27 MED ORDER — MENTHOL 3 MG MT LOZG: 1.0000 | LOZENGE | OROMUCOSAL | Status: DC | PRN

## 2016-05-27 MED ORDER — ALBUMIN HUMAN 5 % IV SOLN
12.5000 g | Freq: Once | INTRAVENOUS | Status: AC
  Administered 2016-05-27: 12.5 g via INTRAVENOUS

## 2016-05-27 MED ORDER — METOCLOPRAMIDE HCL 5 MG PO TABS: 5.0000 mg | ORAL_TABLET | Freq: Three times a day (TID) | ORAL | Status: DC | PRN

## 2016-05-27 MED ORDER — OXYCODONE HCL 5 MG PO TABS
5.0000 mg | ORAL_TABLET | ORAL | Status: DC | PRN
  Administered 2016-05-27 – 2016-05-30 (×9): 10 mg via ORAL
  Filled 2016-05-27 (×10): qty 2

## 2016-05-27 MED ORDER — ALUM & MAG HYDROXIDE-SIMETH 200-200-20 MG/5ML PO SUSP
30.0000 mL | ORAL | Status: DC | PRN
  Administered 2016-05-30: 30 mL via ORAL
  Filled 2016-05-27: qty 30

## 2016-05-27 MED ORDER — MIDAZOLAM HCL 2 MG/2ML IJ SOLN
INTRAMUSCULAR | Status: AC
  Filled 2016-05-27: qty 2

## 2016-05-27 MED ORDER — LACTATED RINGERS IV SOLN
INTRAVENOUS | Status: DC
Start: 2016-05-27 — End: 2016-05-27
  Administered 2016-05-27 (×2): via INTRAVENOUS

## 2016-05-27 MED ORDER — TRANEXAMIC ACID 1000 MG/10ML IV SOLN
2000.0000 mg | Freq: Once | INTRAVENOUS | Status: AC
  Administered 2016-05-27: 2000 mg via TOPICAL
  Filled 2016-05-27: qty 20

## 2016-05-27 MED ORDER — MIDAZOLAM HCL 5 MG/5ML IJ SOLN
INTRAMUSCULAR | Status: DC | PRN
  Administered 2016-05-27: 2 mg via INTRAVENOUS

## 2016-05-27 MED ORDER — OXYCODONE HCL 5 MG PO TABS
ORAL_TABLET | ORAL | Status: AC
  Filled 2016-05-27: qty 2

## 2016-05-27 MED ORDER — BUPIVACAINE HCL (PF) 0.25 % IJ SOLN
INTRAMUSCULAR | Status: DC | PRN
  Administered 2016-05-27: 50 mL

## 2016-05-27 MED ORDER — FENTANYL CITRATE (PF) 100 MCG/2ML IJ SOLN
INTRAMUSCULAR | Status: AC
  Filled 2016-05-27: qty 2

## 2016-05-27 MED ORDER — APIXABAN 2.5 MG PO TABS
2.5000 mg | ORAL_TABLET | Freq: Two times a day (BID) | ORAL | Status: DC
  Administered 2016-05-28 – 2016-05-30 (×5): 2.5 mg via ORAL
  Filled 2016-05-27 (×5): qty 1

## 2016-05-27 MED ORDER — BUPIVACAINE IN DEXTROSE 0.75-8.25 % IT SOLN
INTRATHECAL | Status: DC | PRN
  Administered 2016-05-27: 2 mL via INTRATHECAL

## 2016-05-27 MED ORDER — LIDOCAINE 2% (20 MG/ML) 5 ML SYRINGE
INTRAMUSCULAR | Status: DC | PRN
  Administered 2016-05-27: 60 mg via INTRAVENOUS

## 2016-05-27 MED ORDER — SENNOSIDES-DOCUSATE SODIUM 8.6-50 MG PO TABS: 1.0000 | ORAL_TABLET | Freq: Every evening | ORAL | Status: DC | PRN

## 2016-05-27 MED ORDER — PHENYLEPHRINE HCL 10 MG/ML IJ SOLN
0.0000 ug/min | INTRAVENOUS | Status: DC
  Administered 2016-05-27: 23.333 ug/min via INTRAVENOUS

## 2016-05-27 MED ORDER — METOCLOPRAMIDE HCL 5 MG/ML IJ SOLN
5.0000 mg | Freq: Three times a day (TID) | INTRAMUSCULAR | Status: DC | PRN
  Administered 2016-05-28 – 2016-05-29 (×3): 10 mg via INTRAVENOUS
  Filled 2016-05-27 (×3): qty 2

## 2016-05-27 MED ORDER — ONDANSETRON HCL 4 MG/2ML IJ SOLN
INTRAMUSCULAR | Status: AC
  Filled 2016-05-27: qty 4

## 2016-05-27 MED ORDER — BUPIVACAINE LIPOSOME 1.3 % IJ SUSP
20.0000 mL | Freq: Once | INTRAMUSCULAR | Status: AC
  Administered 2016-05-27: 20 mL
  Filled 2016-05-27: qty 20

## 2016-05-27 MED ORDER — FAMOTIDINE 10 MG PO TABS
10.0000 mg | ORAL_TABLET | Freq: Every evening | ORAL | Status: DC | PRN
  Filled 2016-05-27: qty 1

## 2016-05-27 MED ORDER — ALBUMIN HUMAN 5 % IV SOLN
INTRAVENOUS | Status: AC
  Filled 2016-05-27: qty 250

## 2016-05-27 MED ORDER — CHLORHEXIDINE GLUCONATE 4 % EX LIQD: 60.0000 mL | Freq: Once | CUTANEOUS | Status: DC

## 2016-05-27 MED ORDER — KCL IN DEXTROSE-NACL 20-5-0.45 MEQ/L-%-% IV SOLN
INTRAVENOUS | Status: AC
  Filled 2016-05-27: qty 1000

## 2016-05-27 MED ORDER — FENTANYL CITRATE (PF) 100 MCG/2ML IJ SOLN
25.0000 ug | INTRAMUSCULAR | Status: DC | PRN
  Administered 2016-05-27: 25 ug via INTRAVENOUS

## 2016-05-27 MED ORDER — DIPHENHYDRAMINE HCL 50 MG/ML IJ SOLN
INTRAMUSCULAR | Status: DC | PRN
  Administered 2016-05-27: 25 mg via INTRAVENOUS

## 2016-05-27 MED ORDER — KETOROLAC TROMETHAMINE 30 MG/ML IJ SOLN
30.0000 mg | Freq: Once | INTRAMUSCULAR | Status: AC
  Administered 2016-05-27: 30 mg via INTRAVENOUS

## 2016-05-27 MED ORDER — ACETAMINOPHEN 650 MG RE SUPP
650.0000 mg | Freq: Four times a day (QID) | RECTAL | Status: DC | PRN
  Administered 2016-05-28: 650 mg via RECTAL
  Filled 2016-05-27: qty 1

## 2016-05-27 MED ORDER — PHENYLEPHRINE HCL 10 MG/ML IJ SOLN
INTRAMUSCULAR | Status: DC | PRN
  Administered 2016-05-27: 80 ug via INTRAVENOUS

## 2016-05-27 MED ORDER — SODIUM CHLORIDE 0.9 % IJ SOLN
INTRAMUSCULAR | Status: DC | PRN
  Administered 2016-05-27: 50 mL

## 2016-05-27 MED ORDER — ONDANSETRON HCL 4 MG PO TABS
4.0000 mg | ORAL_TABLET | Freq: Four times a day (QID) | ORAL | Status: DC | PRN
  Administered 2016-05-30: 4 mg via ORAL
  Filled 2016-05-27: qty 1

## 2016-05-27 MED ORDER — DEXTROSE-NACL 5-0.45 % IV SOLN: INTRAVENOUS | Status: DC

## 2016-05-27 MED ORDER — METHOCARBAMOL 500 MG PO TABS
ORAL_TABLET | ORAL | Status: AC
  Filled 2016-05-27: qty 1

## 2016-05-27 MED ORDER — HYDROMORPHONE HCL 2 MG/ML IJ SOLN
1.0000 mg | INTRAMUSCULAR | Status: DC | PRN
Start: 2016-05-27 — End: 2016-05-30
  Administered 2016-05-28 – 2016-05-29 (×6): 1 mg via INTRAVENOUS
  Filled 2016-05-27 (×6): qty 1

## 2016-05-27 MED ORDER — METHOCARBAMOL 500 MG PO TABS
500.0000 mg | ORAL_TABLET | Freq: Four times a day (QID) | ORAL | Status: DC | PRN
  Administered 2016-05-27 – 2016-05-29 (×4): 500 mg via ORAL
  Filled 2016-05-27 (×3): qty 1

## 2016-05-27 MED ORDER — FLEET ENEMA 7-19 GM/118ML RE ENEM: 1.0000 | ENEMA | Freq: Once | RECTAL | Status: DC | PRN

## 2016-05-27 MED ORDER — DIPHENHYDRAMINE HCL 12.5 MG/5ML PO ELIX: 12.5000 mg | ORAL_SOLUTION | ORAL | Status: DC | PRN

## 2016-05-27 MED ORDER — DIPHENHYDRAMINE HCL 50 MG/ML IJ SOLN
INTRAMUSCULAR | Status: AC
  Filled 2016-05-27: qty 2

## 2016-05-27 MED ORDER — ACETAMINOPHEN 325 MG PO TABS
650.0000 mg | ORAL_TABLET | Freq: Four times a day (QID) | ORAL | Status: DC | PRN
  Administered 2016-05-28 – 2016-05-30 (×4): 650 mg via ORAL
  Filled 2016-05-27 (×4): qty 2

## 2016-05-27 MED ORDER — PHENYLEPHRINE HCL 10 MG/ML IJ SOLN
INTRAVENOUS | Status: DC | PRN
  Administered 2016-05-27: 20 ug/min via INTRAVENOUS

## 2016-05-27 MED ORDER — SODIUM CHLORIDE 0.9 % IV SOLN
1000.0000 mg | INTRAVENOUS | Status: AC
  Administered 2016-05-27: 1000 mg via INTRAVENOUS
  Filled 2016-05-27: qty 10

## 2016-05-27 MED ORDER — EPHEDRINE 5 MG/ML INJ
INTRAVENOUS | Status: AC
  Filled 2016-05-27: qty 10

## 2016-05-27 MED ORDER — SODIUM CHLORIDE 0.9 % IR SOLN
Status: DC | PRN
  Administered 2016-05-27: 3000 mL

## 2016-05-27 MED ORDER — PROPOFOL 10 MG/ML IV BOLUS
INTRAVENOUS | Status: AC
  Filled 2016-05-27: qty 20

## 2016-05-27 MED ORDER — ONDANSETRON HCL 4 MG/2ML IJ SOLN: 4.0000 mg | Freq: Once | INTRAMUSCULAR | Status: DC | PRN

## 2016-05-27 MED ORDER — KCL IN DEXTROSE-NACL 20-5-0.45 MEQ/L-%-% IV SOLN
INTRAVENOUS | Status: DC
  Administered 2016-05-27: 19:00:00 via INTRAVENOUS
  Administered 2016-05-28: 125 mL/h via INTRAVENOUS
  Administered 2016-05-29: 01:00:00 via INTRAVENOUS
  Filled 2016-05-27 (×3): qty 1000

## 2016-05-27 MED ORDER — KETOROLAC TROMETHAMINE 30 MG/ML IJ SOLN
INTRAMUSCULAR | Status: AC
  Filled 2016-05-27: qty 1

## 2016-05-27 MED ORDER — ONDANSETRON HCL 4 MG/2ML IJ SOLN
4.0000 mg | Freq: Four times a day (QID) | INTRAMUSCULAR | Status: DC | PRN
  Administered 2016-05-28 – 2016-05-30 (×7): 4 mg via INTRAVENOUS
  Filled 2016-05-27 (×7): qty 2

## 2016-05-27 MED ORDER — PROPOFOL 500 MG/50ML IV EMUL
INTRAVENOUS | Status: DC | PRN
  Administered 2016-05-27: 75 ug/kg/min via INTRAVENOUS
  Administered 2016-05-27: 14:00:00 via INTRAVENOUS

## 2016-05-27 MED ORDER — DEXMEDETOMIDINE BOLUS VIA INFUSION: 0.7000 ug/kg | Freq: Once | INTRAVENOUS | Status: DC

## 2016-05-27 MED ORDER — METHOCARBAMOL 1000 MG/10ML IJ SOLN
500.0000 mg | Freq: Four times a day (QID) | INTRAVENOUS | Status: DC | PRN
  Filled 2016-05-27: qty 5

## 2016-05-27 MED ORDER — BUPIVACAINE HCL (PF) 0.25 % IJ SOLN
INTRAMUSCULAR | Status: AC
  Filled 2016-05-27: qty 60

## 2016-05-27 MED ORDER — CEFAZOLIN SODIUM-DEXTROSE 2-4 GM/100ML-% IV SOLN
2.0000 g | INTRAVENOUS | Status: AC
  Administered 2016-05-27: 2 g via INTRAVENOUS
  Filled 2016-05-27: qty 100

## 2016-05-27 MED ORDER — LIDOCAINE 2% (20 MG/ML) 5 ML SYRINGE
INTRAMUSCULAR | Status: AC
  Filled 2016-05-27: qty 10

## 2016-05-27 MED ORDER — ONDANSETRON HCL 4 MG/2ML IJ SOLN
INTRAMUSCULAR | Status: DC | PRN
  Administered 2016-05-27: 4 mg via INTRAVENOUS

## 2016-05-27 MED ORDER — PHENYLEPHRINE 40 MCG/ML (10ML) SYRINGE FOR IV PUSH (FOR BLOOD PRESSURE SUPPORT)
PREFILLED_SYRINGE | INTRAVENOUS | Status: AC
  Filled 2016-05-27: qty 10

## 2016-05-27 MED ORDER — ROPIVACAINE HCL 7.5 MG/ML IJ SOLN
INTRAMUSCULAR | Status: DC | PRN
  Administered 2016-05-27: 20 mL via PERINEURAL

## 2016-05-27 MED ORDER — PHENOL 1.4 % MT LIQD: 1.0000 | OROMUCOSAL | Status: DC | PRN

## 2016-05-27 MED ORDER — CEFUROXIME SODIUM 1.5 G IJ SOLR
INTRAMUSCULAR | Status: AC
  Filled 2016-05-27: qty 1.5

## 2016-05-27 MED ORDER — DOCUSATE SODIUM 100 MG PO CAPS
100.0000 mg | ORAL_CAPSULE | Freq: Two times a day (BID) | ORAL | Status: DC
  Administered 2016-05-27 – 2016-05-30 (×6): 100 mg via ORAL
  Filled 2016-05-27 (×6): qty 1

## 2016-05-27 MED ORDER — BISACODYL 5 MG PO TBEC: 5.0000 mg | DELAYED_RELEASE_TABLET | Freq: Every day | ORAL | Status: DC | PRN

## 2016-05-27 SURGICAL SUPPLY — 61 items
BANDAGE ESMARK 6X9 LF (GAUZE/BANDAGES/DRESSINGS) ×1 IMPLANT
BLADE CUTTER GATOR 3.5 (BLADE) ×1 IMPLANT
BLADE SAG 18X100X1.27 (BLADE) ×2 IMPLANT
BLADE SAW SGTL 13X75X1.27 (BLADE) ×2 IMPLANT
BNDG CMPR 9X6 STRL LF SNTH (GAUZE/BANDAGES/DRESSINGS) ×1
BNDG CMPR MED 10X6 ELC LF (GAUZE/BANDAGES/DRESSINGS) ×2
BNDG COHESIVE 6X5 TAN STRL LF (GAUZE/BANDAGES/DRESSINGS) ×2 IMPLANT
BNDG ELASTIC 6X10 VLCR STRL LF (GAUZE/BANDAGES/DRESSINGS) ×3 IMPLANT
BNDG ESMARK 6X9 LF (GAUZE/BANDAGES/DRESSINGS) ×2
BOWL SMART MIX CTS (DISPOSABLE) ×2 IMPLANT
CAPT KNEE TOTAL 3 ATTUNE ×1 IMPLANT
CEMENT HV SMART SET (Cement) ×4 IMPLANT
COVER SURGICAL LIGHT HANDLE (MISCELLANEOUS) ×2 IMPLANT
CUFF TOURNIQUET SINGLE 34IN LL (TOURNIQUET CUFF) ×2 IMPLANT
CUFF TOURNIQUET SINGLE 44IN (TOURNIQUET CUFF) IMPLANT
DRAPE EXTREMITY T 121X128X90 (DRAPE) ×2 IMPLANT
DRAPE U-SHAPE 47X51 STRL (DRAPES) ×2 IMPLANT
DRSG AQUACEL AG ADV 3.5X10 (GAUZE/BANDAGES/DRESSINGS) ×2 IMPLANT
DRSG PAD ABDOMINAL 8X10 ST (GAUZE/BANDAGES/DRESSINGS) ×1 IMPLANT
DURAPREP 26ML APPLICATOR (WOUND CARE) ×2 IMPLANT
ELECT CAUTERY BLADE 6.4 (BLADE) ×1 IMPLANT
ELECT REM PT RETURN 9FT ADLT (ELECTROSURGICAL) ×2
ELECTRODE REM PT RTRN 9FT ADLT (ELECTROSURGICAL) ×1 IMPLANT
GAUZE SPONGE 4X4 12PLY STRL (GAUZE/BANDAGES/DRESSINGS) ×1 IMPLANT
GAUZE XEROFORM 1X8 LF (GAUZE/BANDAGES/DRESSINGS) ×1 IMPLANT
GLOVE BIO SURGEON STRL SZ7.5 (GLOVE) ×2 IMPLANT
GLOVE BIO SURGEON STRL SZ8.5 (GLOVE) ×2 IMPLANT
GLOVE BIOGEL PI IND STRL 8 (GLOVE) ×1 IMPLANT
GLOVE BIOGEL PI IND STRL 9 (GLOVE) ×1 IMPLANT
GLOVE BIOGEL PI INDICATOR 8 (GLOVE) ×1
GLOVE BIOGEL PI INDICATOR 9 (GLOVE) ×1
GOWN STRL REUS W/ TWL LRG LVL3 (GOWN DISPOSABLE) ×1 IMPLANT
GOWN STRL REUS W/ TWL XL LVL3 (GOWN DISPOSABLE) ×2 IMPLANT
GOWN STRL REUS W/TWL LRG LVL3 (GOWN DISPOSABLE) ×4
GOWN STRL REUS W/TWL XL LVL3 (GOWN DISPOSABLE) ×4
HANDPIECE INTERPULSE COAX TIP (DISPOSABLE) ×2
HOOD PEEL AWAY FACE SHEILD DIS (HOOD) ×5 IMPLANT
KIT BASIN OR (CUSTOM PROCEDURE TRAY) ×2 IMPLANT
KIT ROOM TURNOVER OR (KITS) ×2 IMPLANT
MANIFOLD NEPTUNE II (INSTRUMENTS) ×2 IMPLANT
NEEDLE 22X1 1/2 (OR ONLY) (NEEDLE) ×4 IMPLANT
NS IRRIG 1000ML POUR BTL (IV SOLUTION) ×2 IMPLANT
PACK TOTAL JOINT (CUSTOM PROCEDURE TRAY) ×2 IMPLANT
PAD ABD 8X10 STRL (GAUZE/BANDAGES/DRESSINGS) ×1 IMPLANT
PAD ARMBOARD 7.5X6 YLW CONV (MISCELLANEOUS) ×4 IMPLANT
PAD CAST 4YDX4 CTTN HI CHSV (CAST SUPPLIES) IMPLANT
PADDING CAST COTTON 4X4 STRL (CAST SUPPLIES) ×2
SET HNDPC FAN SPRY TIP SCT (DISPOSABLE) ×1 IMPLANT
STOCKINETTE IMPERVIOUS 9X36 MD (GAUZE/BANDAGES/DRESSINGS) ×2 IMPLANT
SUT VIC AB 0 CT1 27 (SUTURE) ×2
SUT VIC AB 0 CT1 27XBRD ANBCTR (SUTURE) ×1 IMPLANT
SUT VIC AB 1 CTX 36 (SUTURE) ×2
SUT VIC AB 1 CTX36XBRD ANBCTR (SUTURE) ×1 IMPLANT
SUT VIC AB 2-0 CT1 27 (SUTURE) ×2
SUT VIC AB 2-0 CT1 TAPERPNT 27 (SUTURE) ×1 IMPLANT
SUT VIC AB 3-0 CT1 27 (SUTURE) ×2
SUT VIC AB 3-0 CT1 TAPERPNT 27 (SUTURE) ×1 IMPLANT
SYR CONTROL 10ML LL (SYRINGE) ×4 IMPLANT
TOWEL OR 17X24 6PK STRL BLUE (TOWEL DISPOSABLE) ×2 IMPLANT
TOWEL OR 17X26 10 PK STRL BLUE (TOWEL DISPOSABLE) ×2 IMPLANT
TRAY CATH 16FR W/PLASTIC CATH (SET/KITS/TRAYS/PACK) IMPLANT

## 2016-05-27 NOTE — Interval H&P Note (Signed)
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 After consideration of risks, benefits and other options for treatment, the patient has consented to  Procedure(s): TOTAL KNEE ARTHROPLASTY WITH RIGHT KNEE ARTHROSCOPY (Left) as a surgical intervention .  The patient's history has been reviewed, patient examined, no change in status, stable for surgery.  I have reviewed the patient's chart and labs.  Questions were answered to the patient's satisfaction.     Kerin Salen

## 2016-05-27 NOTE — Anesthesia Procedure Notes (Signed)
Anesthesia Regional Block:  Adductor canal block  Pre-Anesthetic Checklist: ,, timeout performed, Correct Patient, Correct Site, Correct Laterality, Correct Procedure, Correct Position, site marked, Risks and benefits discussed,  Surgical consent,  Pre-op evaluation,  At surgeon's request and post-op pain management  Laterality: Left and Lower  Prep: chloraprep       Needles:  Injection technique: Single-shot  Needle Type: Echogenic Stimulator Needle          Additional Needles:  Procedures: ultrasound guided (picture in chart) Adductor canal block Narrative:  Start time: 05/27/2016 12:13 PM End time: 05/27/2016 12:16 PM Injection made incrementally with aspirations every 5 mL.  Performed by: Personally  Anesthesiologist: Alfonza Toft  Additional Notes: H+P and labs reviewed, risks and benefits discussed with patient, procedure tolerated well without complications

## 2016-05-27 NOTE — Progress Notes (Signed)
Pt very sedated but arousable after IV Precedex. SBP 60-70, SB 50's. Dr Linna Caprice here & aware. New order for IV Albumin 250cc, then NEO drip if needed after Albumin in. Will continue to monitor closely.

## 2016-05-27 NOTE — Discharge Instructions (Addendum)
Information on my medicine - ELIQUIS (apixaban)  This medication education was reviewed with me or my healthcare representative as part of my discharge preparation.  The pharmacist that spoke with me during my hospital stay was:  Eudelia Bunch, [facility]  Why was Eliquis prescribed for you? Eliquis was prescribed for you to reduce the risk of blood clots forming after orthopedic surgery.    What do You need to know about Eliquis? Take your Eliquis TWICE DAILY - one tablet in the morning and one tablet in the evening with or without food.  It would be best to take the dose about the same time each day.  If you have difficulty swallowing the tablet whole please discuss with your pharmacist how to take the medication safely.  Take Eliquis exactly as prescribed by your doctor and DO NOT stop taking Eliquis without talking to the doctor who prescribed the medication.  Stopping without other medication to take the place of Eliquis may increase your risk of developing a clot.  After discharge, you should have regular check-up appointments with your healthcare provider that is prescribing your Eliquis.  What do you do if you miss a dose? If a dose of ELIQUIS is not taken at the scheduled time, take it as soon as possible on the same day and twice-daily administration should be resumed.  The dose should not be doubled to make up for a missed dose.  Do not take more than one tablet of ELIQUIS at the same time.  Important Safety Information A possible side effect of Eliquis is bleeding. You should call your healthcare provider right away if you experience any of the following: ? Bleeding from an injury or your nose that does not stop. ? Unusual colored urine (red or dark brown) or unusual colored stools (red or black). ? Unusual bruising for unknown reasons. ? A serious fall or if you hit your head (even if there is no bleeding).  Some medicines may interact with Eliquis and might increase  your risk of bleeding or clotting while on Eliquis. To help avoid this, consult your healthcare provider or pharmacist prior to using any new prescription or non-prescription medications, including herbals, vitamins, non-steroidal anti-inflammatory drugs (NSAIDs) and supplements.  This website has more information on Eliquis (apixaban): http://www.eliquis.com/eliquis/homeINSTRUCTIONS AFTER JOINT REPLACEMENT   o Remove items at home which could result in a fall. This includes throw rugs or furniture in walking pathways o ICE to the affected joint every three hours while awake for 30 minutes at a time, for at least the first 3-5 days, and then as needed for pain and swelling.  Continue to use ice for pain and swelling. You may notice swelling that will progress down to the foot and ankle.  This is normal after surgery.  Elevate your leg when you are not up walking on it.   o Continue to use the breathing machine you got in the hospital (incentive spirometer) which will help keep your temperature down.  It is common for your temperature to cycle up and down following surgery, especially at night when you are not up moving around and exerting yourself.  The breathing machine keeps your lungs expanded and your temperature down.   DIET:  As you were doing prior to hospitalization, we recommend a well-balanced diet.  DRESSING / WOUND CARE / SHOWERING  Keep the surgical dressing until follow up.  The dressing is water proof, so you can shower without any extra covering.  IF THE  DRESSING FALLS OFF or the wound gets wet inside, change the dressing with sterile gauze.  Please use good hand washing techniques before changing the dressing.  Do not use any lotions or creams on the incision until instructed by your surgeon.    ACTIVITY  o Increase activity slowly as tolerated, but follow the weight bearing instructions below.   o No driving for 6 weeks or until further direction given by your physician.  You  cannot drive while taking narcotics.  o No lifting or carrying greater than 10 lbs. until further directed by your surgeon. o Avoid periods of inactivity such as sitting longer than an hour when not asleep. This helps prevent blood clots.  o You may return to work once you are authorized by your doctor.     WEIGHT BEARING   Weight bearing as tolerated with assist device (walker, cane, etc) as directed, use it as long as suggested by your surgeon or therapist, typically at least 4-6 weeks.   EXERCISES  Results after joint replacement surgery are often greatly improved when you follow the exercise, range of motion and muscle strengthening exercises prescribed by your doctor. Safety measures are also important to protect the joint from further injury. Any time any of these exercises cause you to have increased pain or swelling, decrease what you are doing until you are comfortable again and then slowly increase them. If you have problems or questions, call your caregiver or physical therapist for advice.   Rehabilitation is important following a joint replacement. After just a few days of immobilization, the muscles of the leg can become weakened and shrink (atrophy).  These exercises are designed to build up the tone and strength of the thigh and leg muscles and to improve motion. Often times heat used for twenty to thirty minutes before working out will loosen up your tissues and help with improving the range of motion but do not use heat for the first two weeks following surgery (sometimes heat can increase post-operative swelling).   These exercises can be done on a training (exercise) mat, on the floor, on a table or on a bed. Use whatever works the best and is most comfortable for you.    Use music or television while you are exercising so that the exercises are a pleasant break in your day. This will make your life better with the exercises acting as a break in your routine that you can look  forward to.   Perform all exercises about fifteen times, three times per day or as directed.  You should exercise both the operative leg and the other leg as well.  Exercises include:    Quad Sets - Tighten up the muscle on the front of the thigh (Quad) and hold for 5-10 seconds.    Straight Leg Raises - With your knee straight (if you were given a brace, keep it on), lift the leg to 60 degrees, hold for 3 seconds, and slowly lower the leg.  Perform this exercise against resistance later as your leg gets stronger.   Leg Slides: Lying on your back, slowly slide your foot toward your buttocks, bending your knee up off the floor (only go as far as is comfortable). Then slowly slide your foot back down until your leg is flat on the floor again.   Angel Wings: Lying on your back spread your legs to the side as far apart as you can without causing discomfort.   Hamstring Strength:  Lying on  your back, push your heel against the floor with your leg straight by tightening up the muscles of your buttocks.  Repeat, but this time bend your knee to a comfortable angle, and push your heel against the floor.  You may put a pillow under the heel to make it more comfortable if necessary.   A rehabilitation program following joint replacement surgery can speed recovery and prevent re-injury in the future due to weakened muscles. Contact your doctor or a physical therapist for more information on knee rehabilitation.    CONSTIPATION  Constipation is defined medically as fewer than three stools per week and severe constipation as less than one stool per week.  Even if you have a regular bowel pattern at home, your normal regimen is likely to be disrupted due to multiple reasons following surgery.  Combination of anesthesia, postoperative narcotics, change in appetite and fluid intake all can affect your bowels.   YOU MUST use at least one of the following options; they are listed in order of increasing strength  to get the job done.  They are all available over the counter, and you may need to use some, POSSIBLY even all of these options:    Drink plenty of fluids (prune juice may be helpful) and high fiber foods Colace 100 mg by mouth twice a day  Senokot for constipation as directed and as needed Dulcolax (bisacodyl), take with full glass of water  Miralax (polyethylene glycol) once or twice a day as needed.  If you have tried all these things and are unable to have a bowel movement in the first 3-4 days after surgery call either your surgeon or your primary doctor.    If you experience loose stools or diarrhea, hold the medications until you stool forms back up.  If your symptoms do not get better within 1 week or if they get worse, check with your doctor.  If you experience "the worst abdominal pain ever" or develop nausea or vomiting, please contact the office immediately for further recommendations for treatment.   ITCHING:  If you experience itching with your medications, try taking only a single pain pill, or even half a pain pill at a time.  You can also use Benadryl over the counter for itching or also to help with sleep.   TED HOSE STOCKINGS:  Use stockings on both legs until for at least 2 weeks or as directed by physician office. They may be removed at night for sleeping.  MEDICATIONS:  See your medication summary on the After Visit Summary that nursing will review with you.  You may have some home medications which will be placed on hold until you complete the course of blood thinner medication.  It is important for you to complete the blood thinner medication as prescribed.  PRECAUTIONS:  If you experience chest pain or shortness of breath - call 911 immediately for transfer to the hospital emergency department.   If you develop a fever greater that 101 F, purulent drainage from wound, increased redness or drainage from wound, foul odor from the wound/dressing, or calf pain - CONTACT  YOUR SURGEON.                                                   FOLLOW-UP APPOINTMENTS:  If you do not already have a post-op appointment, please  call the office for an appointment to be seen by your surgeon.  Guidelines for how soon to be seen are listed in your After Visit Summary, but are typically between 1-4 weeks after surgery.  OTHER INSTRUCTIONS:   Knee Replacement:  Do not place pillow under knee, focus on keeping the knee straight while resting. CPM instructions: 0-90 degrees, 2 hours in the morning, 2 hours in the afternoon, and 2 hours in the evening. Place foam block, curve side up under heel at all times except when in CPM or when walking.  DO NOT modify, tear, cut, or change the foam block in any way.  MAKE SURE YOU:   Understand these instructions.   Get help right away if you are not doing well or get worse.    Thank you for letting us be a part of your medical care team.  It is a privilege we respect greatly.  We hope these instructions will help you stay on track for a fast and full recovery!

## 2016-05-27 NOTE — Anesthesia Preprocedure Evaluation (Signed)
Anesthesia Evaluation  Patient identified by MRN, date of birth, ID band Patient awake    Reviewed: Allergy & Precautions, NPO status , Patient's Chart, lab work & pertinent test results  History of Anesthesia Complications (+) PONV and history of anesthetic complications  Airway Mallampati: II  TM Distance: >3 FB Neck ROM: Full    Dental  (+) Teeth Intact   Pulmonary neg pulmonary ROS,    breath sounds clear to auscultation       Cardiovascular + Peripheral Vascular Disease   Rhythm:Regular     Neuro/Psych negative neurological ROS  negative psych ROS   GI/Hepatic Neg liver ROS, GERD  Medicated and Controlled,  Endo/Other  negative endocrine ROS  Renal/GU negative Renal ROS     Musculoskeletal  (+) Arthritis ,   Abdominal   Peds  Hematology negative hematology ROS (+)   Anesthesia Other Findings   Reproductive/Obstetrics                             Anesthesia Physical Anesthesia Plan  ASA: II  Anesthesia Plan: Spinal   Post-op Pain Management:  Regional for Post-op pain   Induction: Intravenous  Airway Management Planned: Natural Airway, Nasal Cannula and Simple Face Mask  Additional Equipment: None  Intra-op Plan:   Post-operative Plan:   Informed Consent: I have reviewed the patients History and Physical, chart, labs and discussed the procedure including the risks, benefits and alternatives for the proposed anesthesia with the patient or authorized representative who has indicated his/her understanding and acceptance.   Dental advisory given  Plan Discussed with: CRNA and Surgeon  Anesthesia Plan Comments:         Anesthesia Quick Evaluation

## 2016-05-27 NOTE — Op Note (Signed)
PATIENT ID:      Jennifer Frank  MRN:     [id number] DOB/AGE:    [birth year] / 66 y.o.       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   Weight as of this encounter: 87.1 kg (192 lb).                                                        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 Patient has failed all conservative measures including anti-inflammatory medicines, narcotics, attempts at  exercise and weight loss, cortisone injections and viscosupplementation.  Risks and benefits of surgery have been discussed, questions answered.   DESCRIPTION OF PROCEDURE: The patient identified by armband, received  IV antibiotics, in the holding area at [facility]. Patient taken to the operating room, appropriate anesthetic  monitors were attached, and spinal anesthesia was  induced.  Tourniquet  applied high to the Left thigh. Lateral post and foot positioner  applied to the table on the left, and a lateral post only on the right., Bilateral lower extremity were then prepped and draped in usual sterile fashion from the toes to the tourniquet. Time-out procedure was performed. We began with the right knee arthroscopy first big standard inferomedial and inferolateral peripatellar portals with a #11 blade allowing introduction arthroscope inferolateral portal and the out flow to the inferomedial portal. Pump pressure was set at 70 mmHg and the right knee was inflated. We identified minimal chondromalacia of the patella grade 2 that was lightly debrided with a 3.5 mm Gator sucker shaver moving into the medial compartment there was some grade 2-3 chondral malacia of the distal aspect of the medial femoral condyle again debrided with the 55 Gator sucker shaver as is an incidental degenerative tear the medial meniscus posterior  medially. Moving to the intercondylar notch were identified a normal anterior cruciate ligament and PCL moving to the lateral side we identified an anterior horn tear of the lateral meniscus was debrided back to a stable margin with a 3.5 mm Gator sucker shaver. The gutters were cleared medially and laterally we did find some small bits of cartilage in the joint fluid that was taken through the outflow. At this point the arthroscopic instance removed the right knee and a dressing of Xerofoam 4 x 4 dressing sponges web roll and Ace wrap applied.   We began the operation on the left side, with the knee flexed 120 degrees, by making the anterior midline incision starting at handbreadth above the patella going over the patella 1 cm medial to and 4 cm distal to the tibial tubercle. Small bleeders in the skin and the  subcutaneous tissue identified and cauterized. Transverse retinaculum was incised and reflected medially and a medial parapatellar arthrotomy was accomplished. the  patella was everted and theprepatellar fat pad resected. The superficial medial collateral  ligament was then elevated from anterior to posterior along the proximal  flare of the tibia and anterior half of the menisci resected. The knee was hyperflexed exposing bone on bone arthritis. Peripheral and notch osteophytes as well as the cruciate ligaments were then resected. We continued to  work our way around posteriorly along the proximal tibia, and externally  rotated the tibia subluxing it out from underneath the femur. A McHale  retractor was placed through the notch and a lateral Hohmann retractor  placed, and we then drilled through the proximal tibia in line with the  axis of the tibia followed by an intramedullary guide rod and 2-degree  posterior slope cutting guide. The tibial cutting guide, 3 degree posterior sloped, was pinned into place allowing resection of 7 mm of bone medially and 3 mm of bone laterally. Satisfied with the tibial resection, we then  entered the distal femur 2 mm anterior to the PCL origin with the  intramedullary guide rod and applied the distal femoral cutting guide  set at 9 mm, with 5 degrees of valgus. This was pinned along the  epicondylar axis. At this point, the distal femoral cut was accomplished without difficulty. We then sized for a #5L femoral component and pinned the guide in 3 degrees of external rotation. The chamfer cutting guide was pinned into place. The anterior, posterior, and chamfer cuts were accomplished without difficulty followed by  the Attune RP box cutting guide and the box cut. We also removed posterior osteophytes from the posterior femoral condyles. At this  time, the knee was brought into full extension. We checked our  extension and flexion gaps and found them symmetric for a 5 mm bearing. Distracting in extension with a lamina spreader, the posterior horns of the menisci were removed, and Exparel, diluted to 60 cc, with 20cc NS, and 20cc  0.5% Marcaine,was injected into the capsule and synovium of the knee. The posterior patella cut was accomplished with the 9.5 mm Attune cutting guide, sized for a 34mm dome, and the fixation pegs drilled.The knee  was then once again hyperflexed exposing the proximal tibia. We sized for a # 5 tibial base plate, applied the smokestack and the conical reamer followed by the the Delta fin keel punch. We then hammered into place the Attune RP trial femoral component, drilled the lugs, inserted a  5 mm trial bearing, trial patellar button, and took the knee through range of motion  from 0-130 degrees. No thumb pressure was required for patellar Tracking. At this point, the limb was wrapped with an Esmarch bandage and the tourniquet inflated to 350 mmHg. All trial components were removed, mating surfaces irrigated with pulse lavage, and dried with suction and sponges. 10 cc of the Exparel solution was applied to the cancellus bone of the patella distal femur and proximal tibia.  After waiting 1 minute, the bony surfaces were again, dried with sponges. A double batch of DePuy HV cement with 1500 mg of Zinacef was mixed and applied to all bony metallic mating surfaces except for the posterior condyles of the femur itself. In order, we hammered into place the tibial tray and removed excess cement, the femoral component and removed excess cement. The final Attune RP bearing  was inserted, and the knee brought to full extension with compression.  The patellar button was clamped into place, and excess cement  removed. While the cement cured the wound was irrigated out with normal saline solution pulse lavage. Ligament stability and patellar tracking were checked and found to be excellent. The parapatellar arthrotomy was closed with  running #1 Vicryl suture. The subcutaneous tissue with 0 and 2-0 undyed  Vicryl suture, and the skin with running 3-0 SQ vicryl. A dressing of Xeroform,  4 x 4, dressing sponges, Webril, and  Ace wrap applied. The patient  awakened, and taken to recovery room without difficulty.   Frederik Pear J 05/27/2016, 1:58 PM

## 2016-05-27 NOTE — Anesthesia Procedure Notes (Signed)
Spinal  Patient location during procedure: OR Start time: 05/27/2016 12:09 PM End time: 05/27/2016 12:13 PM Staffing Anesthesiologist: Oleta Mouse Preanesthetic Checklist Completed: patient identified, surgical consent, pre-op evaluation, timeout performed, IV checked, risks and benefits discussed and monitors and equipment checked Spinal Block Patient position: sitting Prep: ChloraPrep and site prepped and draped Patient monitoring: heart rate, cardiac monitor, continuous pulse ox and blood pressure Approach: midline Location: L3-4 Injection technique: single-shot Needle Needle type: Pencan  Needle gauge: 24 G Needle length: 10 cm Assessment Sensory level: T6

## 2016-05-27 NOTE — Anesthesia Procedure Notes (Signed)
Procedure Name: MAC Performed by: Danuel Felicetti B Pre-anesthesia Checklist: Patient identified, Emergency Drugs available, Suction available, Patient being monitored and Timeout performed Patient Re-evaluated:Patient Re-evaluated prior to inductionOxygen Delivery Method: Simple face mask Placement Confirmation: positive ETCO2 Dental Injury: Teeth and Oropharynx as per pre-operative assessment        

## 2016-05-27 NOTE — Progress Notes (Signed)
Pt awake & alert. SBP > 90-weaning NEO. C/o 5/10 pain LLE/knee. Dr Linna Caprice here & aware. Med w/IV Fentanyl

## 2016-05-27 NOTE — Progress Notes (Signed)
Orthopedic Tech Progress Note Patient Details:  Jennifer Frank 17-Sep-1950 [id number]  Patient ID: Jennifer Frank, female   DOB: Sep 21, 1950, 66 y.o.   MRN: [id number]   Jennifer Frank 05/27/2016, 6:35 PMOut of foot rolls

## 2016-05-27 NOTE — Anesthesia Postprocedure Evaluation (Addendum)
Anesthesia Post Note  Patient: Jennifer Frank  Procedure(s) Performed: Procedure(s) (LRB): LEFT TOTAL KNEE ARTHROPLASTY WITH RIGHT KNEE ARTHROSCOPY (Bilateral)  Patient location during evaluation: PACU Anesthesia Type: General Level of consciousness: awake, awake and alert and oriented Pain management: pain level controlled Vital Signs Assessment: post-procedure vital signs reviewed and stable Respiratory status: spontaneous breathing, nonlabored ventilation and respiratory function stable Cardiovascular status: blood pressure returned to baseline Anesthetic complications: no       Last Vitals:  Vitals:   05/27/16 1739 05/27/16 1745  BP:    Pulse: 66 64  Resp: 15 18  Temp:      Last Pain:  Vitals:   05/27/16 1739  TempSrc:   PainSc: 5                  Dezi Brauner COKER

## 2016-05-27 NOTE — Transfer of Care (Signed)
Immediate Anesthesia Transfer of Care Note  Patient: Jennifer Frank  Procedure(s) Performed: Procedure(s): LEFT TOTAL KNEE ARTHROPLASTY WITH RIGHT KNEE ARTHROSCOPY (Bilateral)  Patient Location: PACU  Anesthesia Type:Spinal and MAC combined with regional for post-op pain  Level of Consciousness: awake, alert  and oriented to self, place, year  Airway & Oxygen Therapy: Patient Spontanous Breathing  Post-op Assessment: Report given to RN and Post -op Vital signs reviewed and stable  Post vital signs: Reviewed and stable  Last Vitals:  Vitals:   05/27/16 0950  BP: 124/79  Pulse: 77  Resp: 18  Temp: 37 C    Last Pain:  Vitals:   05/27/16 0950  TempSrc: Oral         Complications: No apparent anesthesia complications

## 2016-05-28 ENCOUNTER — Encounter (HOSPITAL_COMMUNITY): Payer: Self-pay | Admitting: Orthopedic Surgery

## 2016-05-28 LAB — BASIC METABOLIC PANEL
Anion gap: 8 (ref 5–15)
BUN: 8 mg/dL (ref 6–20)
CALCIUM: 8.3 mg/dL — AB (ref 8.9–10.3)
CO2: 25 mmol/L (ref 22–32)
CREATININE: 0.96 mg/dL (ref 0.44–1.00)
Chloride: 103 mmol/L (ref 101–111)
Glucose, Bld: 157 mg/dL — ABNORMAL HIGH (ref 65–99)
Potassium: 3.9 mmol/L (ref 3.5–5.1)
SODIUM: 136 mmol/L (ref 135–145)

## 2016-05-28 LAB — CBC
HCT: 30 % — ABNORMAL LOW (ref 36.0–46.0)
HEMOGLOBIN: 9.6 g/dL — AB (ref 12.0–15.0)
MCH: 28.5 pg (ref 26.0–34.0)
MCHC: 32 g/dL (ref 30.0–36.0)
MCV: 89 fL (ref 78.0–100.0)
PLATELETS: 191 10*3/uL (ref 150–400)
RBC: 3.37 MIL/uL — ABNORMAL LOW (ref 3.87–5.11)
RDW: 12.9 % (ref 11.5–15.5)
WBC: 7.8 10*3/uL (ref 4.0–10.5)

## 2016-05-28 NOTE — Progress Notes (Signed)
PATIENT ID: Jennifer Frank  MRN: [id number]  DOB/AGE:  [birth year] / 66 y.o.  1 Day Post-Op Procedure(s) (LRB): LEFT TOTAL KNEE ARTHROPLASTY WITH RIGHT KNEE ARTHROSCOPY (Bilateral)    PROGRESS NOTE Subjective: Patient is alert, oriented, x2 Nausea, no Vomiting, yes passing gas. Taking PO well. Denies SOB, Chest or Calf Pain. Using Incentive Spirometer, PAS in place. Ambulate WBAT, Patient reports pain as 3/10 .    Objective: Vital signs in last 24 hours: Vitals:   05/27/16 1850 05/27/16 2101 05/28/16 0034 05/28/16 0624  BP: (!) 97/56 108/61 (!) 92/42 108/85  Pulse: (!) 59 74 78 88  Resp: 16 16    Temp: 97.8 F (36.6 C) 98.2 F (36.8 C) 98.2 F (36.8 C) (!) 101.1 F (38.4 C)  TempSrc: Oral Oral Oral Oral  SpO2: 100% 100% 96% 95%  Weight:      Height:          Intake/Output from previous day: I/O last 3 completed shifts: In: 3372.9 [P.O.:150; I.V.:2972.9; IV Piggyback:250] Out: 1500 [Urine:800; Emesis/NG output:500; Blood:200]   Intake/Output this shift: No intake/output data recorded.   LABORATORY DATA: No results for input(s): WBC, HGB, HCT, PLT, NA, K, CL, CO2, BUN, CREATININE, GLUCOSE, GLUCAP, INR, CALCIUM in the last 72 hours.  Invalid input(s): PT, 2  Examination: Neurologically intact ABD soft Neurovascular intact Sensation intact distally Intact pulses distally Dorsiflexion/Plantar flexion intact Incision: dressing C/D/I No cellulitis present Compartment soft}  Assessment:   1 Day Post-Op Procedure(s) (LRB): LEFT TOTAL KNEE ARTHROPLASTY WITH RIGHT KNEE ARTHROSCOPY (Bilateral) ADDITIONAL DIAGNOSIS: Expected Acute Blood Loss Anemia, GERD  Plan: PT/OT WBAT, AROM and PROM  DVT Prophylaxis:  SCDx72hrs, ASA 325 mg BID x 2 weeks DISCHARGE PLAN: Home DISCHARGE NEEDS: HHPT, Walker and 3-in-1 comode seat     Jessicca Stitzer J 05/28/2016, 7:21 AM

## 2016-05-28 NOTE — Evaluation (Signed)
Occupational Therapy Evaluation Patient Details Name: Jennifer Frank MRN: [id number] DOB: 01-05-1951 Today's Date: 05/28/2016    History of Present Illness Pt is a 66 y.o. 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 PMH: bilateral THA, prior Lt knee arthroscopy.    Clinical Impression   Patient presenting with decreased I in self care, functional mobility/transfer, balance, strength, ROM, and safety awareness. Patient reports being I PTA and living at home with husband. Patient currently functioning at mod - +2 assist (for functional mobility/dynamic standing/LB clothing management in standing). Patient will benefit from acute OT to increase overall independence in the areas of ADLs, functional mobility, and safety awareness in order to safely discharge to next venue of care.    Follow Up Recommendations  SNF    Equipment Recommendations  Other (comment) (defer to next venue of care)    Recommendations for Other Services       Precautions / Restrictions Precautions Precautions: Fall;Knee Precaution Booklet Issued: Yes (comment) Precaution Comments: HEP provided, reviewed knee extension precautions.  Restrictions Weight Bearing Restrictions: Yes RLE Weight Bearing: Weight bearing as tolerated LLE Weight Bearing: Weight bearing as tolerated      Mobility Bed Mobility Overal bed mobility: Needs Assistance Bed Mobility: Sit to Supine       Sit to supine: +2 for safety/equipment;+2 for physical assistance;Max assist   General bed mobility comments: assistance for trunk, B LEs, and repositioning in bed +2 for safety  Transfers Overall transfer level: Needs assistance Equipment used: Rolling walker (2 wheeled) Transfers: Sit to/from [facility] Sit to Stand: Mod assist;From elevated surface;+2 physical assistance;+2 safety/equipment Stand pivot transfers: Min assist (mod assist to stand, min assist for pivot)        General transfer comment: cues needed for hand placement and sequence. Transfers performed from bed>BSC>chair.     Balance Overall balance assessment: Needs assistance Sitting-balance support: Single extremity supported Sitting balance-Leahy Scale: Poor     Standing balance support: Bilateral upper extremity supported Standing balance-Leahy Scale: Poor Standing balance comment: heavy reliance on rw              ADL Overall ADL's : Needs assistance/impaired     Lower Body Dressing: Total assistance               Functional mobility during ADLs: +2 for safety/equipment;Rolling walker;Cueing for safety;Cueing for sequencing;Moderate assistance General ADL Comments: Pt reports feeling dizzy during functional mobility and needing maximal encouragement to participate. Pt required mod A of 2 to stand from recliner chair and min A of 2 for few steps and stand pivot transfer back onto bed.  Pt needing total A for LB self care and set up A for UB self care at this time.                Pertinent Vitals/Pain Pain Assessment: 0-10 Pain Score: 7  Pain Location: Lt knee Pain Descriptors / Indicators: Aching;Sharp Pain Intervention(s): Repositioned;Monitored during session     Hand Dominance Right   Extremity/Trunk Assessment Upper Extremity Assessment Upper Extremity Assessment: Generalized weakness   Lower Extremity Assessment Lower Extremity Assessment: Defer to PT evaluation RLE Deficits / Details: pt able to raise without assistance but poor stability.  LLE Deficits / Details: unable to performs SLR       Communication Communication Communication: No difficulties   Cognition Arousal/Alertness: Awake/alert Behavior During Therapy: Flat affect Overall Cognitive Status: Within Functional Limits for tasks assessed  Home Living Family/patient expects to be discharged to:: Unsure Living Arrangements:  Spouse/significant other Available Help at Discharge: Family;Available 24 hours/day Type of Home: House Home Access: Stairs to enter [employer] of Steps: 3 Entrance Stairs-Rails: Right;Left Home Layout: One level               Home Equipment: Walker - 2 wheels          Prior Functioning/Environment Level of Independence: Independent                 OT Problem List: Decreased strength;Decreased range of motion;Decreased activity tolerance;Impaired balance (sitting and/or standing);Decreased knowledge of use of DME or AE;Cardiopulmonary status limiting activity;Pain;Decreased safety awareness   OT Treatment/Interventions: Self-care/ADL training;Therapeutic exercise;Energy conservation;DME and/or AE instruction;Therapeutic activities;Balance training;Patient/family education;Manual therapy    OT Goals(Current goals can be found in the care plan section) Acute Rehab OT Goals Patient Stated Goal: move better and go home OT Goal Formulation: With patient Time For Goal Achievement: 06/11/16 Potential to Achieve Goals: Fair ADL Goals Pt Will Perform Grooming: with set-up Pt Will Perform Upper Body Bathing: with set-up;with supervision Pt Will Perform Lower Body Bathing: with min assist;sit to/from stand Pt Will Perform Upper Body Dressing: with set-up;with supervision Pt Will Perform Lower Body Dressing: with min assist;sit to/from stand Pt Will Transfer to Toilet: with min assist Pt Will Perform Toileting - Clothing Manipulation and hygiene: with min assist Pt Will Perform Tub/Shower Transfer: with min assist  OT Frequency: Min 2X/week   Barriers to D/C:    none known at this time       Co-evaluation PT/OT/SLP Co-Evaluation/Treatment: Yes Reason for Co-Treatment: For patient/therapist safety;To address functional/ADL transfers   OT goals addressed during session: ADL's and self-care;Other (comment) (functional mobility/transfers)      End of Session  Equipment Utilized During Treatment: Gait belt;Rolling walker;Oxygen  Activity Tolerance: Patient limited by fatigue;Patient limited by pain Patient left: in bed;with call bell/phone within reach;with bed alarm set;with family/visitor present   Time: [id number] OT Time Calculation (min): 24 min Charges:  OT General Charges $OT Visit: 1 Procedure OT Evaluation $OT Eval Moderate Complexity: 1 Procedure OT Treatments $Self Care/Home Management : 8-22 mins G-Codes:    Gypsy Decant, MS, OTR/L, CBIS 05/28/2016, 2:18 PM

## 2016-05-28 NOTE — Progress Notes (Signed)
Physical Therapy Treatment Patient Details Name: Jennifer Frank MRN: [id number] DOB: 1950/06/16 Today's Date: 05/28/2016    History of Present Illness Pt is a 66 y.o. 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 PMH: bilateral THA, prior Lt knee arthroscopy.     PT Comments    Pt continues to mobilize very slowly during PT sessions, requiring +2 assistance with bed mobility. Pt was able to ambulate 5 ft before requiring seated rest. Family present and supportive throughout session but overall progress is very slow. PT to continue to follow to maximize mobility and safety. Based upon the patient's current mobility, recommending SNF for further rehabilitation prior to returning home.   Follow Up Recommendations  SNF;Supervision for mobility/OOB     Equipment Recommendations  None recommended by PT    Recommendations for Other Services       Precautions / Restrictions Precautions Precautions: Fall;Knee Restrictions Weight Bearing Restrictions: Yes RLE Weight Bearing: Weight bearing as tolerated LLE Weight Bearing: Weight bearing as tolerated    Mobility  Bed Mobility Overal bed mobility: Needs Assistance Bed Mobility: Sit to Supine       Sit to supine: +2 for safety/equipment;+2 for physical assistance;Max assist   General bed mobility comments: assist provided at trunk and LEs  Transfers Overall transfer level: Needs assistance Equipment used: Rolling walker (2 wheeled) Transfers: Sit to/from [facility] Sit to Stand: +2 physical assistance;Mod assist Stand pivot transfers: Min assist (mod assist to stand, min assist for pivot)       General transfer comment: Sit<>stand from chair performed X2, repeated cues needed for hand placement.   Ambulation/Gait Ambulation/Gait assistance: Min assist Ambulation Distance (Feet): 5 Feet Assistive device: Rolling walker (2 wheeled) Gait Pattern/deviations: Step-to  pattern;Decreased step length - right;Decreased step length - left;Decreased weight shift to left;Decreased stance time - left Gait velocity: very slow sequence   General Gait Details: Pt with very slow pattern and avoiding weightbearing through LLE. Repeated cues for posture needed.    Stairs            Wheelchair Mobility    Modified Rankin (Stroke Patients Only)       Balance Overall balance assessment: Needs assistance Sitting-balance support: Single extremity supported Sitting balance-Leahy Scale: Poor     Standing balance support: Bilateral upper extremity supported Standing balance-Leahy Scale: Poor Standing balance comment: heavy reliance on rw                    Cognition Arousal/Alertness: Lethargic;Suspect due to medications Behavior During Therapy: Flat affect Overall Cognitive Status: Within Functional Limits for tasks assessed                 General Comments: pt keeping her eyes closed during majority of session although not sleeping.     Exercises Total Joint Exercises Ankle Circles/Pumps: AROM;Both;15 reps Quad Sets: Strengthening;Both;10 reps (heavy cues needed) Heel Slides: AAROM;Both;10 reps Hip ABduction/ADduction: Strengthening;Both;10 reps (max assist) Straight Leg Raises: Strengthening;Both;5 reps (max assist bilaterally) Goniometric ROM: Lt 74 degrees, Rt 85 degrees    General Comments General comments (skin integrity, edema, etc.): husband present and supportive during session.       Pertinent Vitals/Pain Pain Assessment: 0-10 Pain Score: 9  Pain Location: Lt knee Pain Descriptors / Indicators: Aching;Sharp Pain Intervention(s): Limited activity within patient's tolerance;Monitored during session    Home Living   Prior Function    PT Goals (current goals can now be found  in the care plan section) Acute Rehab PT Goals Patient Stated Goal: get in bed PT Goal Formulation: With patient Time For Goal Achievement:  06/11/16 Potential to Achieve Goals: Good Progress towards PT goals: Progressing toward goals    Frequency    7X/week      PT Plan Current plan remains appropriate    Co-evaluation PT/OT/SLP Co-Evaluation/Treatment: Yes Reason for Co-Treatment: For patient/therapist safety PT goals addressed during session: Mobility/safety with mobility OT goals addressed during session: ADL's and self-care;Other (comment) (functional mobility/transfers)     End of Session Equipment Utilized During Treatment: Gait belt Activity Tolerance: Patient limited by fatigue;Patient limited by pain;Patient limited by lethargy Patient left: in bed;with call bell/phone within reach;with family/visitor present;with SCD's reapplied (in Lt knee extension)     Time: [id number] PT Time Calculation (min) (ACUTE ONLY): 38 min  Charges:  $Gait Training: 8-22 mins $Therapeutic Exercise: 8-22 mins                    G Codes:      Cassell Clement, PT, CSCS Pager [phone] Office [phone]  05/28/2016, 3:45 PM

## 2016-05-28 NOTE — Evaluation (Signed)
Physical Therapy Evaluation Patient Details Name: Jennifer Frank MRN: [id number] DOB: [birth year] Today's Date: 05/28/2016   History of Present Illness  Pt is a 66 y.o. 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 PMH: bilateral THA, prior Lt knee arthroscopy.   Clinical Impression  Pt is s/p TKA resulting in the deficits listed below (see PT Problem List). Pt mobilizing very slowly during initial PT session with max ambulation of 5 ft with extended time and cues needed. Based upon the patient's current mobility level, she may need short stay SNF for further rehabilitation prior to returning home. PT will continue to follow and modify recommendations as needed depending upon the patient's progress. The patient's husband is present and supportive during session.  Pt will benefit from skilled PT to increase their independence and safety and mobility.      Follow Up Recommendations SNF;Supervision for mobility/OOB    Equipment Recommendations  None recommended by PT    Recommendations for Other Services       Precautions / Restrictions Precautions Precautions: Fall;Knee Precaution Booklet Issued: Yes (comment) Precaution Comments: HEP provided, reviewed knee extension precautions.  Restrictions Weight Bearing Restrictions: Yes RLE Weight Bearing: Weight bearing as tolerated LLE Weight Bearing: Weight bearing as tolerated      Mobility  Bed Mobility               General bed mobility comments: Pt sitting EOB with nursing upon arrival  Transfers Overall transfer level: Needs assistance Equipment used: Rolling walker (2 wheeled) Transfers: Sit to/from [facility] Sit to Stand: Mod assist;From elevated surface Stand pivot transfers: Min assist (mod assist to stand, min assist for pivot)       General transfer comment: cues needed for hand placement and sequence. Transfers performed from bed>BSC>chair.    Ambulation/Gait Ambulation/Gait assistance: Min assist Ambulation Distance (Feet): 5 Feet Assistive device: Rolling walker (2 wheeled) Gait Pattern/deviations: Step-to pattern;Decreased step length - right;Decreased step length - left;Decreased stance time - left;Decreased weight shift to left;Trunk flexed Gait velocity: very slow sequence   General Gait Details: pt minimizing weightbearing through LLE despite cues, repeated cues for posture and sequence.   Stairs            Wheelchair Mobility    Modified Rankin (Stroke Patients Only)       Balance Overall balance assessment: Needs assistance Sitting-balance support: Single extremity supported Sitting balance-Leahy Scale: Poor     Standing balance support: Bilateral upper extremity supported Standing balance-Leahy Scale: Poor Standing balance comment: heavy reliance on rw                             Pertinent Vitals/Pain Pain Assessment: 0-10 Pain Score: 6  Pain Location: Lt knee Pain Descriptors / Indicators: Aching;Sharp Pain Intervention(s): Limited activity within patient's tolerance;Monitored during session    Home Living Family/patient expects to be discharged to:: Unsure Living Arrangements: Spouse/significant other Available Help at Discharge: Family;Available 24 hours/day Type of Home: House Home Access: Stairs to enter Entrance Stairs-Rails: Psychiatric nurse of Steps: 3 Home Layout: One level Home Equipment: Walker - 2 wheels      Prior Function Level of Independence: Independent               Hand Dominance        Extremity/Trunk Assessment   Upper Extremity Assessment Upper Extremity Assessment: Overall WFL for tasks assessed  Lower Extremity Assessment Lower Extremity Assessment: RLE deficits/detail;LLE deficits/detail RLE Deficits / Details: pt able to raise without assistance but poor stability.  LLE Deficits / Details: unable to performs  SLR       Communication   Communication: No difficulties  Cognition Arousal/Alertness: Awake/alert Behavior During Therapy: Flat affect Overall Cognitive Status: Within Functional Limits for tasks assessed                      General Comments General comments (skin integrity, edema, etc.): husband present and supportive during session.     Exercises     Assessment/Plan    PT Assessment Patient needs continued PT services  PT Problem List Decreased strength;Decreased range of motion;Decreased activity tolerance;Decreased balance;Decreased mobility;Pain          PT Treatment Interventions DME instruction;Gait training;Stair training;Functional mobility training;Therapeutic activities;Therapeutic exercise;Patient/family education    PT Goals (Current goals can be found in the Care Plan section)  Acute Rehab PT Goals Patient Stated Goal: less pain, move better PT Goal Formulation: With patient Time For Goal Achievement: 06/11/16 Potential to Achieve Goals: Good    Frequency 7X/week   Barriers to discharge        Co-evaluation               End of Session Equipment Utilized During Treatment: Gait belt Activity Tolerance: Patient limited by fatigue;Patient limited by pain Patient left: in chair;with call bell/phone within reach;with family/visitor present (in lt knee extension) Nurse Communication: Mobility status;Weight bearing status         Time: [id number] PT Time Calculation (min) (ACUTE ONLY): 28 min   Charges:   PT Evaluation $PT Eval Moderate Complexity: 1 Procedure PT Treatments $Gait Training: 8-22 mins   PT G Codes:        Cassell Clement, PT, CSCS Pager [phone] Office [phone]  05/28/2016, 12:21 PM

## 2016-05-28 NOTE — Progress Notes (Addendum)
Patient still having rounds of nausea and vomiting, with rotating doses of zofran and reglan. Patient also has a temperature of 101.1. Nurse administer tylenol. Nurse will continue to monitor  Patient also complains of tingling in her left knee and states that her knee feels like it may be more swollen. MD notified.

## 2016-05-28 NOTE — Progress Notes (Signed)
Orthopedic Tech Progress Note Patient Details:  Jennifer Frank [birth year] [id number]  Ortho Devices Ortho Device/Splint Location: Bilateral foot rolls   [place] Pink 05/28/2016, 4:37 PM

## 2016-05-29 LAB — CBC
HCT: 29.2 % — ABNORMAL LOW (ref 36.0–46.0)
Hemoglobin: 9.5 g/dL — ABNORMAL LOW (ref 12.0–15.0)
MCH: 29.1 pg (ref 26.0–34.0)
MCHC: 32.5 g/dL (ref 30.0–36.0)
MCV: 89.6 fL (ref 78.0–100.0)
Platelets: 172 10*3/uL (ref 150–400)
RBC: 3.26 MIL/uL — ABNORMAL LOW (ref 3.87–5.11)
RDW: 13.2 % (ref 11.5–15.5)
WBC: 9.3 10*3/uL (ref 4.0–10.5)

## 2016-05-29 MED ORDER — APIXABAN 2.5 MG PO TABS: 2.5000 mg | ORAL_TABLET | Freq: Two times a day (BID) | ORAL | 0 refills | Status: DC

## 2016-05-29 MED ORDER — METHOCARBAMOL 500 MG PO TABS: 500.0000 mg | ORAL_TABLET | Freq: Two times a day (BID) | ORAL | 0 refills | Status: DC

## 2016-05-29 MED ORDER — OXYCODONE-ACETAMINOPHEN 5-325 MG PO TABS: 1.0000 | ORAL_TABLET | ORAL | 0 refills | Status: DC | PRN

## 2016-05-29 NOTE — Progress Notes (Signed)
Physical Therapy Treatment Patient Details Name: Jennifer Frank MRN: [id number] DOB: 18-Apr-1951 Today's Date: 05/29/2016    History of Present Illness Pt is a 67 y.o. 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 PMH: bilateral THA, prior Lt knee arthroscopy.     PT Comments    Patient continues to be limited by pain, nausea, and lethargy. Pt required assistance for safe ambulation and to perform all therex. Suspect pt will progress more quickly when pain/nausea are better controlled. Continue to progress as tolerated.    Follow Up Recommendations  SNF;Supervision for mobility/OOB     Equipment Recommendations  None recommended by PT    Recommendations for Other Services       Precautions / Restrictions Precautions Precautions: Fall;Knee Restrictions Weight Bearing Restrictions: Yes RLE Weight Bearing: Weight bearing as tolerated LLE Weight Bearing: Weight bearing as tolerated    Mobility  Bed Mobility Overal bed mobility: Needs Assistance Bed Mobility: Supine to Sit     Supine to sit: Min assist;HOB elevated     General bed mobility comments: assist to bring L LE to EOB  Transfers Overall transfer level: Needs assistance Equipment used: Rolling walker (2 wheeled) Transfers: Sit to/from Stand Sit to Stand: Min assist         General transfer comment: from EOB and BSC; cues for hand placement and technique; assist to power up into standing  Ambulation/Gait Ambulation/Gait assistance: Min assist;+2 safety/equipment Ambulation Distance (Feet):  (14ft then 81ft) Assistive device: Rolling walker (2 wheeled) Gait Pattern/deviations: Decreased weight shift to left;Decreased stance time - left;Decreased step length - right;Decreased step length - left;Antalgic;Trunk flexed;Shuffle Gait velocity: very slow sequence   General Gait Details: cues for posture and proximity of RW; assist for balance, management on RW, and weight  shifting   Stairs            Wheelchair Mobility    Modified Rankin (Stroke Patients Only)       Balance Overall balance assessment: Needs assistance Sitting-balance support: Bilateral upper extremity supported;Feet supported Sitting balance-Leahy Scale: Fair     Standing balance support: Bilateral upper extremity supported Standing balance-Leahy Scale: Poor Standing balance comment: heavy reliance on rw                    Cognition Arousal/Alertness: Lethargic;Suspect due to medications Behavior During Therapy: Flat affect Overall Cognitive Status: Within Functional Limits for tasks assessed                 General Comments: pt needed cues to keep eyes open during session    Exercises Total Joint Exercises Quad Sets: AROM;Both;10 reps Short Arc Quad: AROM;Right;10 reps Heel Slides: AROM;AAROM;Both;10 reps Hip ABduction/ADduction: AROM;AAROM;Both;10 reps Knee Flexion: AAROM;Left;Other reps (comment);Seated;Other (comment) (3X 10 sec holds) Goniometric ROM: 90 degrees R and 80 degrees L in sitting    General Comments General comments (skin integrity, edema, etc.): family present      Pertinent Vitals/Pain Pain Assessment: 0-10 Pain Score: 6  Pain Location: Lt knee Pain Descriptors / Indicators: Aching;Sharp;Guarding;Grimacing    Home Living                      Prior Function            PT Goals (current goals can now be found in the care plan section) Acute Rehab PT Goals Patient Stated Goal: feel better Progress towards PT goals: Progressing toward goals  Frequency    7X/week      PT Plan Current plan remains appropriate    Co-evaluation             End of Session Equipment Utilized During Treatment: Gait belt Activity Tolerance: Patient limited by fatigue;Patient limited by pain;Patient limited by lethargy Patient left: with call bell/phone within reach;with family/visitor present;in chair     Time:  1429-1520 PT Time Calculation (min) (ACUTE ONLY): 51 min  Charges:  $Gait Training: 8-22 mins $Therapeutic Exercise: 8-22 mins $Therapeutic Activity: 8-22 mins                    G Codes:      [place] April, PTA Pager: [phone]   05/29/2016, 3:32 PM

## 2016-05-29 NOTE — NC FL2 (Signed)
[place] LEVEL OF CARE SCREENING TOOL     IDENTIFICATION  Patient Name: Jennifer Frank Birthdate: April 17, 1951 Sex: female Admission Date (Current Location): 05/27/2016  [facility] and [place] Number:  Herbalist and Address:  [facility], [place] [address], [place], [place] [address]      Provider Number: [id number]  Attending Physician Name and Address:  Frederik Pear, MD  Relative Name and Phone Number:       Current Level of Care: Hospital Recommended Level of Care: [place] Prior Approval Number:    Date Approved/Denied: 05/29/16 PASRR Number:    Discharge Plan: SNF    Current Diagnoses: Patient Active Problem List   Diagnosis Date Noted  . Primary localized osteoarthritis of left knee 05/27/2016  . Primary osteoarthritis of left knee 05/26/2016  . Pre-operative cardiovascular examination 04/22/2016  . Hearing loss 04/22/2014  . Varicose veins of lower extremities with other complications [id number]  . Routine health maintenance 03/01/2012  . Hyperlipidemia 02/16/2007  . GERD 02/16/2007    Orientation RESPIRATION BLADDER Height & Weight     Self, Time, Situation, Place  O2 (Nasal Cannula; 2L) Continent Weight: 192 lb (87.1 kg) Height:  5\' 11"  (180.3 cm)  BEHAVIORAL SYMPTOMS/MOOD NEUROLOGICAL BOWEL NUTRITION STATUS      Continent  (Please see discharge summary)  AMBULATORY STATUS COMMUNICATION OF NEEDS Skin   Extensive Assist Verbally Surgical wounds (Closed incision right knee, compression wrapped.  CLosed incision left knee, compression wrapped.)                       Personal Care Assistance Level of Assistance  Bathing, Feeding, Dressing Bathing Assistance: Maximum assistance Feeding assistance: Independent Dressing Assistance: Maximum assistance     Functional Limitations Info  Sight, Hearing, Speech Sight Info: Adequate Hearing Info: Impaired Speech Info: Adequate    SPECIAL CARE  FACTORS FREQUENCY  PT (By licensed PT), OT (By licensed OT)     PT Frequency: 7x week OT Frequency: 7x week            Contractures Contractures Info: Not present    Additional Factors Info  Code Status, Allergies Code Status Info: Full Code Allergies Info: Acetaminophen-codeine, Darvocet Propoxyphene N-acetaminophen, Morphine And Related, Vioxx Rofecoxib           Current Medications (05/29/2016):  This is the current hospital active medication list Current Facility-Administered Medications  Medication Dose Route Frequency Provider Last Rate Last Dose  . acetaminophen (TYLENOL) tablet 650 mg  650 mg Oral Q6H PRN Leighton Parody, PA-C   650 mg at 05/28/16 1457   Or  . acetaminophen (TYLENOL) suppository 650 mg  650 mg Rectal Q6H PRN Leighton Parody, PA-C   650 mg at 05/28/16 0630  . alum & mag hydroxide-simeth (MAALOX/MYLANTA) 200-200-20 MG/5ML suspension 30 mL  30 mL Oral Q4H PRN Leighton Parody, PA-C      . apixaban [facility]) tablet 2.5 mg  2.5 mg Oral Q12H Leighton Parody, PA-C   2.5 mg at 05/29/16 0915  . bisacodyl (DULCOLAX) EC tablet 5 mg  5 mg Oral Daily PRN Leighton Parody, PA-C      . dextrose 5 % and 0.45 % NaCl with KCl 20 mEq/L infusion   Intravenous Continuous Leighton Parody, PA-C 125 mL/hr at 05/29/16 0058    . diphenhydrAMINE (BENADRYL) 12.5 MG/5ML elixir 12.5-25 mg  12.5-25 mg Oral Q4H PRN Leighton Parody, PA-C      .  docusate sodium (COLACE) capsule 100 mg  100 mg Oral BID Leighton Parody, PA-C   100 mg at 05/29/16 0915  . famotidine (PEPCID) tablet 10 mg  10 mg Oral QHS PRN Leighton Parody, PA-C      . HYDROmorphone (DILAUDID) injection 1 mg  1 mg Intravenous Q2H PRN Leighton Parody, PA-C   1 mg at 05/29/16 0915  . menthol-cetylpyridinium (CEPACOL) lozenge 3 mg  1 lozenge Oral PRN Leighton Parody, PA-C       Or  . phenol (CHLORASEPTIC) mouth spray 1 spray  1 spray Mouth/Throat PRN Leighton Parody, PA-C      . methocarbamol (ROBAXIN) tablet 500 mg  500 mg Oral  Q6H PRN Leighton Parody, PA-C   500 mg at 05/29/16 0630   Or  . methocarbamol (ROBAXIN) 500 mg in dextrose 5 % 50 mL IVPB  500 mg Intravenous Q6H PRN Leighton Parody, PA-C      . metoCLOPramide (REGLAN) tablet 5-10 mg  5-10 mg Oral Q8H PRN Leighton Parody, PA-C       Or  . metoCLOPramide (REGLAN) injection 5-10 mg  5-10 mg Intravenous Q8H PRN Leighton Parody, PA-C   10 mg at 05/28/16 1448  . ondansetron (ZOFRAN) tablet 4 mg  4 mg Oral Q6H PRN Leighton Parody, PA-C       Or  . ondansetron [facility]) injection 4 mg  4 mg Intravenous Q6H PRN Leighton Parody, PA-C   4 mg at 05/29/16 0919  . oxyCODONE (Oxy IR/ROXICODONE) immediate release tablet 5-10 mg  5-10 mg Oral Q3H PRN Leighton Parody, PA-C   10 mg at 05/29/16 0630  . senna-docusate (Senokot-S) tablet 1 tablet  1 tablet Oral QHS PRN Leighton Parody, PA-C      . sodium phosphate (FLEET) 7-19 GM/118ML enema 1 enema  1 enema Rectal Once PRN Leighton Parody, PA-C         Discharge Medications: Please see discharge summary for a list of discharge medications.  Relevant Imaging Results:  Relevant Lab Results:   Additional Information SSN: [id number]  Ht: 5\' 11"  (1.803 m)  Wt: 192 lb (87.1 kg)      [address] [place]

## 2016-05-29 NOTE — Progress Notes (Signed)
Physical Therapy Treatment Patient Details Name: Jennifer Frank MRN: [id number] DOB: 1950-10-24 Today's Date: 05/29/2016    History of Present Illness Pt is a 66 y.o. 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 PMH: bilateral THA, prior Lt knee arthroscopy.     PT Comments    Patient continues to be limited by pain and lethargy however did tolerate increased gait distance this session. Pt with difficulty clearing floor safely L side and required mod/max cues for safe ambulation. Continue to progress as tolerated with anticipated d/c to SNF for further skilled PT services.    Follow Up Recommendations  SNF;Supervision for mobility/OOB     Equipment Recommendations  None recommended by PT    Recommendations for Other Services       Precautions / Restrictions Precautions Precautions: Fall;Knee Restrictions Weight Bearing Restrictions: Yes RLE Weight Bearing: Weight bearing as tolerated LLE Weight Bearing: Weight bearing as tolerated    Mobility  Bed Mobility Overal bed mobility: Needs Assistance Bed Mobility: Sit to Supine;Supine to Sit     Supine to sit: Min assist;HOB elevated Sit to supine: Mod assist   General bed mobility comments: assist to bring L LE to EOB and to bring bilat LE into bed  Transfers Overall transfer level: Needs assistance Equipment used: Rolling walker (2 wheeled) Transfers: Sit to/from [employer] Transfers Sit to Stand: +2 physical assistance;Min assist         General transfer comment: assist to power up into standing; cues for hand placement and technique  Ambulation/Gait Ambulation/Gait assistance: Min assist;+2 safety/equipment (chair follow) Ambulation Distance (Feet): 80 Feet Assistive device: Rolling walker (2 wheeled) Gait Pattern/deviations: Decreased weight shift to left;Decreased stance time - left;Step-through pattern;Decreased step length - right;Decreased step length -  left;Antalgic;Trunk flexed Gait velocity: very slow sequence   General Gait Details: cues for posture, safe use of AD, and increased L knee flexion during swing to improve foot clearance; pt fatigued and with decreased bilat step lengths/heel strike with increased distance   Stairs            Wheelchair Mobility    Modified Rankin (Stroke Patients Only)       Balance Overall balance assessment: Needs assistance Sitting-balance support: Bilateral upper extremity supported;Feet supported Sitting balance-Leahy Scale: Fair     Standing balance support: Bilateral upper extremity supported Standing balance-Leahy Scale: Poor Standing balance comment: heavy reliance on rw                    Cognition Arousal/Alertness: Lethargic;Suspect due to medications Behavior During Therapy: Flat affect Overall Cognitive Status: Within Functional Limits for tasks assessed                 General Comments: pt needed cues to keep eyes open during session    Exercises      General Comments General comments (skin integrity, edema, etc.): husband present for session      Pertinent Vitals/Pain Pain Assessment: 0-10 Pain Score: 7  Pain Location: Lt knee Pain Descriptors / Indicators: Aching;Sharp;Guarding;Grimacing Pain Intervention(s): Limited activity within patient's tolerance;Monitored during session;Premedicated before session;Repositioned;Ice applied    Home Living                      Prior Function            PT Goals (current goals can now be found in the care plan section) Acute Rehab PT Goals Patient Stated Goal:  feel better Progress towards PT goals: Progressing toward goals    Frequency    7X/week      PT Plan Current plan remains appropriate    Co-evaluation             End of Session Equipment Utilized During Treatment: Gait belt Activity Tolerance: Patient limited by fatigue;Patient limited by pain;Patient limited by  lethargy Patient left: in bed;with call bell/phone within reach;with family/visitor present     Time: [id number] PT Time Calculation (min) (ACUTE ONLY): 28 min  Charges:  $Gait Training: 8-22 mins $Therapeutic Activity: 8-22 mins                    G Codes:      Salina April, PTA Pager: [phone]   05/29/2016, 9:14 AM

## 2016-05-29 NOTE — Progress Notes (Signed)
PATIENT ID: Jennifer Frank  MRN: [id number]  DOB/AGE:  [birth year] / 66 y.o.  2 Days Post-Op Procedure(s) (LRB): LEFT TOTAL KNEE ARTHROPLASTY WITH RIGHT KNEE ARTHROSCOPY (Bilateral)    PROGRESS NOTE Subjective: Patient is alert, oriented,  Nausea improving, no current Vomiting, yes passing gas. Taking PO liquids with plan to advance as tolerated. Denies SOB, Chest or Calf Pain. Using Incentive Spirometer, PAS in place. Ambulate WBAT with pt walking 5 ft with therapy, Patient reports pain as 7/10 at rest and 9/10 with activity.    Objective: Vital signs in last 24 hours: Vitals:   05/28/16 0624 05/28/16 1456 05/28/16 2109 05/29/16 0521  BP: 108/85 110/67 117/76 133/65  Pulse: 88 86 79 96  Resp:  16 16 16   Temp: (!) 101.1 F (38.4 C) 100.1 F (37.8 C) 98.9 F (37.2 C) 98.2 F (36.8 C)  TempSrc: Oral  Oral Oral  SpO2: 95% 97% 95% 99%  Weight:      Height:          Intake/Output from previous day: I/O last 3 completed shifts: In: 2042.9 [P.O.:480; I.V.:1562.9] Out: 500 [Emesis/NG output:500]   Intake/Output this shift: No intake/output data recorded.   LABORATORY DATA:  Recent Labs  05/28/16 0834 05/29/16 0447  WBC 7.8 9.3  HGB 9.6* 9.5*  HCT 30.0* 29.2*  PLT 191 172  NA 136  --   K 3.9  --   CL 103  --   CO2 25  --   BUN 8  --   CREATININE 0.96  --   GLUCOSE 157*  --   CALCIUM 8.3*  --     Examination: Neurologically intact Neurovascular intact Sensation intact distally Intact pulses distally Dorsiflexion/Plantar flexion intact Incision: dressing C/D/I No cellulitis present Compartment soft}  Assessment:   2 Days Post-Op Procedure(s) (LRB): LEFT TOTAL KNEE ARTHROPLASTY WITH RIGHT KNEE ARTHROSCOPY (Bilateral) ADDITIONAL DIAGNOSIS: Expected Acute Blood Loss Anemia, GERD  Plan: PT/OT WBAT, AROM and PROM  DVT Prophylaxis:  SCDx72hrs, ASA 325 mg BID x 2 weeks DISCHARGE PLAN: Home, when pt passes therapy, likely tomorrow. DISCHARGE NEEDS: HHPT, Walker  and 3-in-1 comode seat     Soriyah Osberg R 05/29/2016, 7:59 AM

## 2016-05-30 LAB — CBC
HCT: 30.4 % — ABNORMAL LOW (ref 36.0–46.0)
HEMOGLOBIN: 9.8 g/dL — AB (ref 12.0–15.0)
MCH: 28.6 pg (ref 26.0–34.0)
MCHC: 32.2 g/dL (ref 30.0–36.0)
MCV: 88.6 fL (ref 78.0–100.0)
PLATELETS: 215 10*3/uL (ref 150–400)
RBC: 3.43 MIL/uL — ABNORMAL LOW (ref 3.87–5.11)
RDW: 12.9 % (ref 11.5–15.5)
WBC: 9.6 10*3/uL (ref 4.0–10.5)

## 2016-05-30 NOTE — Progress Notes (Signed)
PATIENT ID: Jennifer Frank  MRN: [id number]  DOB/AGE:  [birth year] / 66 y.o.  3 Days Post-Op Procedure(s) (LRB): LEFT TOTAL KNEE ARTHROPLASTY WITH RIGHT KNEE ARTHROSCOPY (Bilateral)    PROGRESS NOTE Subjective: Patient is alert, oriented, x1 Nausea, no Vomiting, yes passing gas. Taking PO well. Denies SOB, Chest or Calf Pain. Using Incentive Spirometer, PAS in place. Ambulate 77ft, Patient reports pain as 5/10 .    Objective: Vital signs in last 24 hours: Vitals:   05/29/16 0521 05/29/16 1347 05/29/16 2011 05/30/16 0458  BP: 133/65 127/72 138/63 117/60  Pulse: 96 92 95 95  Resp: 16 18 17 17   Temp: 98.2 F (36.8 C) 99.1 F (37.3 C) 99.8 F (37.7 C) 99.8 F (37.7 C)  TempSrc: Oral  Oral Oral  SpO2: 99% 93% 94% 93%  Weight:      Height:          Intake/Output from previous day: I/O last 3 completed shifts: In: 3995 [P.O.:720; I.V.:3275] Out: -    Intake/Output this shift: No intake/output data recorded.   LABORATORY DATA:  Recent Labs  05/28/16 0834 05/29/16 0447 05/30/16 0539  WBC 7.8 9.3 9.6  HGB 9.6* 9.5* 9.8*  HCT 30.0* 29.2* 30.4*  PLT 191 172 215  NA 136  --   --   K 3.9  --   --   CL 103  --   --   CO2 25  --   --   BUN 8  --   --   CREATININE 0.96  --   --   GLUCOSE 157*  --   --   CALCIUM 8.3*  --   --     Examination: Neurologically intact ABD soft Neurovascular intact Sensation intact distally Intact pulses distally Dorsiflexion/Plantar flexion intact Incision: scant drainage No cellulitis present Compartment soft}  Assessment:   3 Days Post-Op Procedure(s) (LRB): LEFT TOTAL KNEE ARTHROPLASTY WITH RIGHT KNEE ARTHROSCOPY (Bilateral) ADDITIONAL DIAGNOSIS: Expected Acute Blood Loss Anemia, GERD  Plan: PT/OT WBAT, AROM and PROM  DVT Prophylaxis:  SCDx72hrs, ASA 325 mg BID x 2 weeks DISCHARGE PLAN: Skilled Nursing Facility/Rehab, slow progress in PT DISCHARGE NEEDS: HHPT, Walker and 3-in-1 comode seat     Jennifer Frank 05/30/2016, 7:29  AM Patient ID: Jennifer Frank, female   DOB: 1950/10/31, 66 y.o.   MRN: [id number]

## 2016-05-30 NOTE — Clinical Social Work Note (Signed)
CSW has received insurnace auth at this time. Auth#: [id number].Rug level: RVB. Information has been shared with the facility at this time.   [address], [place]

## 2016-05-30 NOTE — Progress Notes (Signed)
Patient to be discharged to [facility]. Report called and given to [facility]. Patient is waiting for [facility] for transportation. IV removed

## 2016-05-30 NOTE — Progress Notes (Signed)
Physical Therapy Treatment Patient Details Name: Jennifer Frank MRN: [id number] DOB: Mar 02, 1951 Today's Date: 05/30/2016    History of Present Illness Pt is a 66 y.o. 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 PMH: bilateral THA, prior Lt knee arthroscopy.     PT Comments    Patient is progressing toward mobility goals and was more alert this session. Continue to progress as tolerated with anticipated d/c to SNF for further skilled PT services.    Follow Up Recommendations  SNF;Supervision for mobility/OOB     Equipment Recommendations  None recommended by PT    Recommendations for Other Services       Precautions / Restrictions Precautions Precautions: Fall;Knee Restrictions Weight Bearing Restrictions: Yes RLE Weight Bearing: Weight bearing as tolerated LLE Weight Bearing: Weight bearing as tolerated    Mobility  Bed Mobility Overal bed mobility: Needs Assistance Bed Mobility: Supine to Sit     Supine to sit: Min assist;HOB elevated     General bed mobility comments: assist to bring L LE to EOB  Transfers Overall transfer level: Needs assistance Equipment used: Rolling walker (2 wheeled) Transfers: Sit to/from Stand Sit to Stand: Min assist         General transfer comment: assist to power up into standing; cues for hand placement  Ambulation/Gait Ambulation/Gait assistance: Min assist Ambulation Distance (Feet): 100 Feet Assistive device: Rolling walker (2 wheeled) Gait Pattern/deviations: Decreased weight shift to left;Decreased stance time - left;Decreased step length - right;Antalgic;Trunk flexed Gait velocity: decreased   General Gait Details: cues for posture, L heel strike, and L knee flexion during swing phase; pt with short bilat step lengths and decreased bilat foot clearance   Stairs            Wheelchair Mobility    Modified Rankin (Stroke Patients Only)       Balance Overall  balance assessment: Needs assistance Sitting-balance support: Bilateral upper extremity supported;Feet supported Sitting balance-Leahy Scale: Fair     Standing balance support: Bilateral upper extremity supported Standing balance-Leahy Scale: Poor Standing balance comment: heavy reliance on rw                    Cognition Arousal/Alertness: Awake/alert Behavior During Therapy: WFL for tasks assessed/performed Overall Cognitive Status: Within Functional Limits for tasks assessed                      Exercises Total Joint Exercises Heel Slides: AAROM;Left;10 reps Knee Flexion: Left;Other reps (comment);Seated;Other (comment);AROM;AAROM (5X 10 sec holds) Goniometric ROM: ~80 degrees L in sitting    General Comments        Pertinent Vitals/Pain Pain Assessment: Faces Faces Pain Scale: Hurts even more Pain Location: Lt knee Pain Descriptors / Indicators: Aching;Sharp;Guarding;Grimacing Pain Intervention(s): Limited activity within patient's tolerance;Monitored during session;Premedicated before session;Repositioned    Home Living                      Prior Function            PT Goals (current goals can now be found in the care plan section) Acute Rehab PT Goals Patient Stated Goal: feel better Progress towards PT goals: Progressing toward goals    Frequency    7X/week      PT Plan Current plan remains appropriate    Co-evaluation             End of Session Equipment Utilized  During Treatment: Gait belt Activity Tolerance: Patient tolerated treatment well Patient left: with call bell/phone within reach;with family/visitor present;in chair     Time: [id number] PT Time Calculation (min) (ACUTE ONLY): 41 min  Charges:  $Gait Training: 8-22 mins $Therapeutic Exercise: 8-22 mins $Therapeutic Activity: 8-22 mins                    G Codes:      Salina April, PTA Pager: [phone]   05/30/2016, 11:09  AM

## 2016-05-30 NOTE — Clinical Social Work Note (Signed)
Clinical Social Worker facilitated patient discharge including contacting patient family and facility to confirm patient discharge plans.  Clinical information faxed to facility and family agreeable with plan.  CSW arranged ambulance transport via [facility] to [facility] .  RN to call [phone] for report prior to discharge.  Clinical Social Worker will sign off for now as social work intervention is no longer needed. Please consult [place] again if new need arises.  [address], [place]

## 2016-05-30 NOTE — Clinical Social Work Note (Signed)
Clinical Social Work Assessment  Patient Details  Name: Jennifer Frank MRN: [id number] Date of Birth: 03/29/1951  Date of referral:  05/30/16               Reason for consult:  Facility Placement                Permission sought to share information with:  Family Supports Permission granted to share information::     Name::        Agency::     Relationship::     Contact Information:     Housing/Transportation Living arrangements for the past 2 months:  Single Family Home Source of Information:  Patient Patient Interpreter Needed:  None Criminal Activity/Legal Involvement Pertinent to Current Situation/Hospitalization:  No - Comment as needed Significant Relationships:  Adult Children, Spouse Lives with:  Spouse Do you feel safe going back to the place where you live?  Yes Need for family participation in patient care:  No (Coment)  Care giving concerns:  Pt's spouse present at bedside during initial assessment.    Social Worker assessment / plan:  CSW spoke with pt at bedside for initial assessment. Pt lives at home with her spouse. Pt is agreeable to SNF Placement at this time. Pt prefers [facility]. Pt is familiar with SNF placement as her mom was in a facility in the past. CSW will reach out to facility at this time.   Employment status:  Retired Nurse, adult PT Recommendations:  [place] / Referral to community resources:  [place]  Patient/Family's Response to care:  Pt verbalized understanding of CSW role and expressed appreciation for support. Pt denies any concern for pt care at this time.  Patient/Family's Understanding of and Emotional Response to Diagnosis, Current Treatment, and Prognosis:  Pt understanding and realistic regarding physical limitations at this time. Pt is agreeable to SNF placement at d/c. Pt denies any concern regarding treatment plan at this time.   Emotional  Assessment Appearance:    Attitude/Demeanor/Rapport:   (Patient was appropriate.) Affect (typically observed):  Accepting, Appropriate, Calm Orientation:  Oriented to Self, Oriented to Place, Oriented to  Time, Oriented to Situation Alcohol / Substance use:  Not Applicable Psych involvement (Current and /or in the community):  No (Comment)  Discharge Needs  Concerns to be addressed:  No discharge needs identified Readmission within the last 30 days:  Yes Current discharge risk:  Dependent with Mobility Barriers to Discharge:  Continued Medical Work up   [employer], LCSW 05/30/2016, 8:56 AM

## 2016-05-30 NOTE — Discharge Summary (Signed)
Patient ID: Jennifer Frank MRN: [id number] DOB/AGE: Jul 12, 1950 66 y.o.  Admit date: 05/27/2016 Discharge date: 05/30/2016  Admission Diagnoses:  Principal Problem:   Primary osteoarthritis of left knee Active Problems:   Primary localized osteoarthritis of left knee   Discharge Diagnoses:  Same  Past Medical History:  Diagnosis Date  . Fundic gland polyps of stomach, benign   . GERD (gastroesophageal reflux disease)   . IBS (irritable bowel syndrome)   . Personal history of colonic polyps - adenoma 09/22/2013  . PONV (postoperative nausea and vomiting)   . Reflux     Surgeries: Procedure(s): LEFT TOTAL KNEE ARTHROPLASTY WITH RIGHT KNEE ARTHROSCOPY on 05/27/2016   Consultants:   Discharged Condition: Improved  Hospital Course: Jennifer Frank is an 66 y.o. female who was admitted 05/27/2016 for operative treatment ofPrimary osteoarthritis of left knee. Patient has severe unremitting pain that affects sleep, daily activities, and work/hobbies. After pre-op clearance the patient was taken to the operating room on 05/27/2016 and underwent  Procedure(s): LEFT TOTAL KNEE ARTHROPLASTY WITH RIGHT KNEE ARTHROSCOPY.    Patient was given perioperative antibiotics: Anti-infectives    Start     Dose/Rate Route Frequency Ordered Stop   05/27/16 0924  ceFAZolin (ANCEF) IVPB 2g/100 mL premix     2 g 200 mL/hr over 30 Minutes Intravenous On call to O.R. 05/27/16 [id number] 05/27/16 1217       Patient was given sequential compression devices, early ambulation, and chemoprophylaxis to prevent DVT.  Patient benefited maximally from hospital stay and there were no complications.    Recent vital signs: Patient Vitals for the past 24 hrs:  BP Temp Temp src Pulse Resp SpO2  05/30/16 0458 117/60 99.8 F (37.7 C) Oral 95 17 93 %  05/29/16 2011 138/63 99.8 F (37.7 C) Oral 95 17 94 %  05/29/16 1347 127/72 99.1 F (37.3 C) - 92 18 93 %     Recent laboratory studies:  Recent Labs  05/28/16 0834  05/29/16 0447 05/30/16 0539  WBC 7.8 9.3 9.6  HGB 9.6* 9.5* 9.8*  HCT 30.0* 29.2* 30.4*  PLT 191 172 215  NA 136  --   --   K 3.9  --   --   CL 103  --   --   CO2 25  --   --   BUN 8  --   --   CREATININE 0.96  --   --   GLUCOSE 157*  --   --   CALCIUM 8.3*  --   --      Discharge Medications:   Allergies as of 05/30/2016      Reactions   Acetaminophen-codeine Nausea And Vomiting   Darvocet [propoxyphene N-acetaminophen] Nausea And Vomiting   Morphine And Related Nausea And Vomiting   Vioxx [rofecoxib] Hives, Swelling      Medication List    TAKE these medications   acetaminophen 500 MG tablet Commonly known as:  TYLENOL Take 500 mg by mouth daily as needed for headache.   apixaban 2.5 MG Tabs tablet Commonly known as:  ELIQUIS Take 1 tablet (2.5 mg total) by mouth 2 (two) times daily.   famotidine 10 MG chewable tablet Commonly known as:  PEPCID AC Chew 10 mg by mouth at bedtime as needed for heartburn.   methocarbamol 500 MG tablet Commonly known as:  ROBAXIN Take 1 tablet (500 mg total) by mouth 2 (two) times daily with a meal.   oxyCODONE-acetaminophen 5-325 MG tablet Commonly known as:  ROXICET Take 1-2 tablets by mouth every 4 (four) hours as needed.            Durable Medical Equipment        Start     Ordered   05/27/16 1959  DME Walker rolling  Once    Question:  Patient needs a walker to treat with the following condition  Answer:  Primary localized osteoarthritis of left knee   05/27/16 1958   05/27/16 1959  DME 3 n 1  Once     05/27/16 1958   05/27/16 1959  DME Bedside commode  Once    Question:  Patient needs a bedside commode to treat with the following condition  Answer:  Primary localized osteoarthritis of left knee   05/27/16 1958      Diagnostic Studies: Dg Chest 2 View  Result Date: 05/16/2016 CLINICAL DATA:  Preop knee replacement EXAM: CHEST  2 VIEW COMPARISON:  None. FINDINGS: Heart and mediastinal contours are within  normal limits. No focal opacities or effusions. No acute bony abnormality. IMPRESSION: No active cardiopulmonary disease. Electronically Signed   By: Rolm Baptise M.D.   On: 05/16/2016 11:49    Disposition: 01-Home or Self Care  Discharge Instructions    Call MD / Call 911    Complete by:  As directed    If you experience chest pain or shortness of breath, CALL 911 and be transported to the hospital emergency room.  If you develope a fever above 101 F, pus (white drainage) or increased drainage or redness at the wound, or calf pain, call your surgeon's office.   Constipation Prevention    Complete by:  As directed    Drink plenty of fluids.  Prune juice may be helpful.  You may use a stool softener, such as Colace (over the counter) 100 mg twice a day.  Use MiraLax (over the counter) for constipation as needed.   Diet - low sodium heart healthy    Complete by:  As directed    Driving restrictions    Complete by:  As directed    No driving for 2 weeks   Increase activity slowly as tolerated    Complete by:  As directed    Patient may shower    Complete by:  As directed    You may shower without a dressing once there is no drainage.  Do not wash over the wound.  If drainage remains, cover wound with plastic wrap and then shower.      Follow-up Information    Kerin Salen, MD Follow up in 2 week(s).   Specialty:  Orthopedic Surgery Contact information: [place] [address] [phone]            Signed: Hardin Negus Ben Habermann R 05/30/2016, 10:35 AM

## 2016-05-31 ENCOUNTER — Telehealth: Payer: Self-pay

## 2016-05-31 NOTE — Telephone Encounter (Signed)
Pt is on TCM list and dc'ed on 05/30/2016 after left knee arthroplasy procedure.

## 2016-06-03 ENCOUNTER — Non-Acute Institutional Stay (SKILLED_NURSING_FACILITY): Payer: Medicare Other | Admitting: Internal Medicine

## 2016-06-03 ENCOUNTER — Encounter: Payer: Self-pay | Admitting: Internal Medicine

## 2016-06-03 DIAGNOSIS — R110 Nausea: Secondary | ICD-10-CM | POA: Diagnosis not present

## 2016-06-03 DIAGNOSIS — K5901 Slow transit constipation: Secondary | ICD-10-CM

## 2016-06-03 DIAGNOSIS — M1712 Unilateral primary osteoarthritis, left knee: Secondary | ICD-10-CM | POA: Diagnosis not present

## 2016-06-03 DIAGNOSIS — D62 Acute posthemorrhagic anemia: Secondary | ICD-10-CM

## 2016-06-03 DIAGNOSIS — R2681 Unsteadiness on feet: Secondary | ICD-10-CM

## 2016-06-03 DIAGNOSIS — K219 Gastro-esophageal reflux disease without esophagitis: Secondary | ICD-10-CM | POA: Diagnosis not present

## 2016-06-03 NOTE — Progress Notes (Signed)
LOCATION: [facility]  PCP: Hoyt Koch, MD   Code Status: Full Code  Goals of care: Advanced Directive information Advanced Directives 05/16/2016  Does Patient Have a Medical Advance Directive? Yes  Type of Paramedic of [place];Living will  Does patient want to make changes to medical advance directive? No - Patient declined  Copy of [place] in Chart? No - copy requested  Would patient like information on creating a medical advance directive? -       Extended Emergency Contact Information Primary Emergency Contact: Clarene Duke Address: [address], [place] [address] Johnnette Litter of [place] Phone: [phone] Mobile Phone: [phone] Relation: Spouse   Allergies  Allergen Reactions  . Acetaminophen-Codeine Nausea And Vomiting  . Darvocet [Propoxyphene N-Acetaminophen] Nausea And Vomiting  . Morphine And Related Nausea And Vomiting  . Vioxx [Rofecoxib] Hives and Swelling    Chief Complaint  Patient presents with  . New Admit To SNF    New Admission Visit      HPI:  Patient is a 66 y.o. female seen today for short term rehabilitation post hospital admission from 05/27/16-05/30/16 with left knee OA. She underwent left total knee arthroplasty on 05/27/16. She is seen in her room today.   Review of Systems:  Constitutional: Negative for fever, chills, diaphoresis.  HENT: Negative for headache, congestion, nasal discharge, sore throat, difficulty swallowing.   Eyes: Negative for double vision and discharge. Wears glasses.   Respiratory: Negative for cough, shortness of breath and wheezing.   Cardiovascular: Negative for chest pain, palpitations, leg swelling.  Gastrointestinal: Negative for heartburn,vomiting, abdominal pain. Positive for nausea. Last bowel movement was last night. Genitourinary: Negative for dysuria and flank pain.  positive for pain to left  knee. Musculoskeletal: Negative for back pain, fall in the facility.  Skin: Negative for itching, rash.  Neurological: Negative for dizziness. Psychiatric/Behavioral: Negative for depression   Past Medical History:  Diagnosis Date  . Fundic gland polyps of stomach, benign   . GERD (gastroesophageal reflux disease)   . IBS (irritable bowel syndrome)   . Personal history of colonic polyps - adenoma 09/22/2013  . PONV (postoperative nausea and vomiting)   . Reflux    Past Surgical History:  Procedure Laterality Date  . COLONOSCOPY    . ESOPHAGOGASTRODUODENOSCOPY    . HIP ARTHROSCOPY Left 1997  . JOINT REPLACEMENT Bilateral [id number]   hip  . KNEE ARTHROPLASTY Left 12/2015  . KNEE ARTHROSCOPY Left 1996  . TONSILLECTOMY AND ADENOIDECTOMY  1957  . TOTAL KNEE ARTHROPLASTY Bilateral 05/27/2016   Procedure: LEFT TOTAL KNEE ARTHROPLASTY WITH RIGHT KNEE ARTHROSCOPY;  Surgeon: Frederik Pear, MD;  Location: [facility];  Service: Orthopedics;  Laterality: Bilateral;   Social History:   reports that she has never smoked. She has never used smokeless tobacco. She reports that she drinks about 0.6 oz of alcohol per week . She reports that she does not use drugs.  Family History  Problem Relation Age of Onset  . Deep vein thrombosis Mother   . Heart disease Mother     before age 61  . Hyperlipidemia Mother   . Hypertension Mother   . Other Mother     varicose veins  . Heart attack Mother   . Peripheral vascular disease Mother   . Bleeding Disorder Mother   . Deep vein thrombosis Father   . Heart disease Father  before age 67  . Hyperlipidemia Father   . Hypertension Father   . Heart attack Father   . Bleeding Disorder Father   . AAA (abdominal aortic aneurysm) Father   . Cancer Brother   . Hyperlipidemia Brother     Medications: Allergies as of 06/03/2016      Reactions   Acetaminophen-codeine Nausea And Vomiting   Darvocet [propoxyphene N-acetaminophen] Nausea And Vomiting    Morphine And Related Nausea And Vomiting   Vioxx [rofecoxib] Hives, Swelling      Medication List       Accurate as of 06/03/16  2:47 PM. Always use your most recent med list.          acetaminophen 500 MG tablet Commonly known as:  TYLENOL Take 500 mg by mouth daily as needed for headache.   apixaban 2.5 MG Tabs tablet Commonly known as:  ELIQUIS Take 1 tablet (2.5 mg total) by mouth 2 (two) times daily.   famotidine 10 MG chewable tablet Commonly known as:  PEPCID AC Chew 10 mg by mouth at bedtime as needed for heartburn.   methocarbamol 500 MG tablet Commonly known as:  ROBAXIN Take 1 tablet (500 mg total) by mouth 2 (two) times daily with a meal.   oxyCODONE-acetaminophen 5-325 MG tablet Commonly known as:  ROXICET Take 1-2 tablets by mouth every 4 (four) hours as needed.       Immunizations: Immunization History  Administered Date(s) Administered  . Influenza Whole 02/21/2010, 02/21/2014  . PPD Test 05/30/2016  . Td 01/16/2009  . Tdap 03/17/2014     Physical Exam: Vitals:   06/03/16 1443  BP: 124/61  Pulse: 92  Resp: 20  Temp: 98.3 F (36.8 C)  TempSrc: Oral  SpO2: 95%  Weight: 187 lb 9.6 oz (85.1 kg)  Height: 5\' 11"  (1.803 m)   Body mass index is 26.16 kg/m.  General- elderly female, well built, in no acute distress Head- normocephalic, atraumatic Nose- no maxillary or frontal sinus tenderness, no nasal discharge Throat- moist mucus membrane  Eyes- PERRLA, EOMI, no pallor, no icterus, no discharge, normal conjunctiva, normal sclera Neck- no cervical lymphadenopathy  Cardiovascular- normal s1,s2, no murmur Respiratory- bilateral clear to auscultation, no wheeze, no rhonchi, no crackles, no use of accessory muscles Abdomen- bowel sounds present, soft, non tender Musculoskeletal- able to move all 4 extremities, limited left knee range of motion, trace left leg edema  Neurological- alert and oriented to person, place and time Skin- warm and  dry, left knee surgical incision with Aquacel dressing clean and dry minimal erythema around the dressing which has decreased compared to previously marked area Psychiatry- normal mood and affect    Labs reviewed: Basic Metabolic Panel:  Recent Labs  04/22/16 1106 05/16/16 0948 05/28/16 0834  NA 138 141 136  K 4.4 4.2 3.9  CL 103 103 103  CO2 28 29 25   GLUCOSE 91 91 157*  BUN 18 15 8   CREATININE 0.88 0.85 0.96  CALCIUM 9.4 9.9 8.3*   Liver Function Tests:  Recent Labs  01/28/16 0103 04/22/16 1106  AST 18 8  ALT 12* 9  ALKPHOS 75 64  BILITOT 0.5 0.3  PROT 7.3 7.1  ALBUMIN 3.7 3.9    Recent Labs  01/28/16 0103  LIPASE 30   No results for input(s): AMMONIA in the last 8760 hours. CBC:  Recent Labs  05/16/16 0948 05/28/16 0834 05/29/16 0447 05/30/16 0539  WBC 9.8 7.8 9.3 9.6  NEUTROABS 7.1  --   --   --  HGB 13.1 9.6* 9.5* 9.8*  HCT 39.6 30.0* 29.2* 30.4*  MCV 88.4 89.0 89.6 88.6  PLT 300 191 172 215   Cardiac Enzymes: No results for input(s): CKTOTAL, CKMB, CKMBINDEX, TROPONINI in the last 8760 hours. BNP: Invalid input(s): POCBNP CBG: No results for input(s): GLUCAP in the last 8760 hours.  Radiological Exams: Dg Chest 2 View  Result Date: 05/16/2016 CLINICAL DATA:  Preop knee replacement EXAM: CHEST  2 VIEW COMPARISON:  None. FINDINGS: Heart and mediastinal contours are within normal limits. No focal opacities or effusions. No acute bony abnormality. IMPRESSION: No active cardiopulmonary disease. Electronically Signed   By: Rolm Baptise M.D.   On: 05/16/2016 11:49    Assessment/Plan  Unsteady gait With left knee surgery. Will need her to work with physical therapy and occupational therapy.  Left knee osteoarthritis Status post left total knee arthroplasty on 05/27/2016. Will need follow-up with orthopedic. Continue Roxicet 5-325 milligrams 1-2 tablet every 4 hours as needed. Change her Tylenol from 500 mg daily as needed to 500 mg every 6  hours as needed for pain. Get PMR consult. Continue Robaxin 500 mg twice a day for muscle spasm. Continue eliquis for DVT prophylaxis.  Blood loss anemia Post surgery. Monitor CBC.  Constipation Add senna S1 tablet daily at bedtime and monitor. Encouraged hydration.  Nausea without vomiting And Zofran 4 mg every 8 hours as needed  Gastroesophageal reflux disease Control. Continue Pepcid 10 mg daily as needed and monitor.    Goals of care: short term rehabilitation   Labs/tests ordered: cbc, bmp 06/05/16  Family/ staff Communication: reviewed care plan with patient and nursing supervisor    Blanchie Serve, MD Internal Medicine [place], [place] [address] Cell Phone (Monday-Friday 8 am - 5 pm): [phone] On Call: [phone] and follow prompts after 5 pm and on weekends Office Phone: [phone] Office Fax: [phone]

## 2016-06-04 ENCOUNTER — Non-Acute Institutional Stay (SKILLED_NURSING_FACILITY): Payer: Medicare Other | Admitting: Family

## 2016-06-04 DIAGNOSIS — L539 Erythematous condition, unspecified: Secondary | ICD-10-CM | POA: Diagnosis not present

## 2016-06-04 DIAGNOSIS — R600 Localized edema: Secondary | ICD-10-CM

## 2016-06-04 NOTE — Progress Notes (Signed)
Location:  [facility] Room Number: [place] of Service:  SNF [phone]) Provider:  Jule Whitsel FNP-C   Hoyt Koch, MD  Patient Care Team: Hoyt Koch, MD as PCP - General (Internal Medicine)  Extended Emergency Contact Information Primary Emergency Contact: Clarene Duke Address: [address]          [place], [place] [address] Johnnette Litter of [place] Phone: [phone] Mobile Phone: [phone] Relation: Spouse Secondary Emergency Contact: [place] of [place] Mobile Phone: [phone] Relation: Son  Code Status:  Full Code  Goals of care: Advanced Directive information Advanced Directives 05/16/2016  Does Patient Have a Medical Advance Directive? Yes  Type of Paramedic of [place];Living will  Does patient want to make changes to medical advance directive? No - Patient declined  Copy of [place] in Chart? No - copy requested  Would patient like information on creating a medical advance directive? -     Chief Complaint  Patient presents with  . Acute Visit    redness on knee     HPI:  Pt is a 66 y.o. female seen today [facility] for an acute visit for evaluation of left knee redness. She is status post left knee arthroplasty 05/27/2016. She is seen in her room today with husband at bedside per facility Nurse request. Facility Nurse reports patient's incision site was red and warm to touch. She was seen by facility wound care nurse who marked around the red area. Patient states left knee has been red for the past several days since surgery but has improved. Area marked by wound care Nurse shows no worsening of redness. She states left knee pain under control so long as she takes pain killers on time.She continues therapy with PT/OT. She denies any fever or chills.    Past Medical History:  Diagnosis Date  . Fundic gland  polyps of stomach, benign   . GERD (gastroesophageal reflux disease)   . IBS (irritable bowel syndrome)   . Personal history of colonic polyps - adenoma 09/22/2013  . PONV (postoperative nausea and vomiting)   . Reflux    Past Surgical History:  Procedure Laterality Date  . COLONOSCOPY    . ESOPHAGOGASTRODUODENOSCOPY    . HIP ARTHROSCOPY Left 1997  . JOINT REPLACEMENT Bilateral [id number]   hip  . KNEE ARTHROPLASTY Left 12/2015  . KNEE ARTHROSCOPY Left 1996  . TONSILLECTOMY AND ADENOIDECTOMY  1957  . TOTAL KNEE ARTHROPLASTY Bilateral 05/27/2016   Procedure: LEFT TOTAL KNEE ARTHROPLASTY WITH RIGHT KNEE ARTHROSCOPY;  Surgeon: Frederik Pear, MD;  Location: [facility];  Service: Orthopedics;  Laterality: Bilateral;    Allergies  Allergen Reactions  . Acetaminophen-Codeine Nausea And Vomiting  . Darvocet [Propoxyphene N-Acetaminophen] Nausea And Vomiting  . Morphine And Related Nausea And Vomiting  . Vioxx [Rofecoxib] Hives and Swelling    Allergies as of 06/04/2016      Reactions   Acetaminophen-codeine Nausea And Vomiting   Darvocet [propoxyphene N-acetaminophen] Nausea And Vomiting   Morphine And Related Nausea And Vomiting   Vioxx [rofecoxib] Hives, Swelling      Medication List       Accurate as of 06/04/16  3:57 PM. Always use your most recent med list.          acetaminophen 500 MG tablet Commonly known as:  TYLENOL Take 500 mg by mouth every 6 (six) hours as needed for  headache.   apixaban 2.5 MG Tabs tablet Commonly known as:  ELIQUIS Take 1 tablet (2.5 mg total) by mouth 2 (two) times daily.   famotidine 10 MG chewable tablet Commonly known as:  PEPCID AC Chew 10 mg by mouth at bedtime as needed for heartburn.   methocarbamol 500 MG tablet Commonly known as:  ROBAXIN Take 1 tablet (500 mg total) by mouth 2 (two) times daily with a meal.   ondansetron 4 MG tablet Commonly known as:  ZOFRAN Take 4 mg by mouth every 8 (eight) hours as needed for nausea or  vomiting.   oxyCODONE-acetaminophen 5-325 MG tablet Commonly known as:  ROXICET Take 1-2 tablets by mouth every 4 (four) hours as needed.   senna 8.6 MG tablet Commonly known as:  SENOKOT Take 1 tablet by mouth at bedtime.       Review of Systems  Constitutional: Negative for activity change, appetite change, chills and fever.  HENT: Negative for congestion, rhinorrhea, sinus pain, sinus pressure, sneezing and sore throat.   Eyes: Negative.   Respiratory: Negative for cough, chest tightness, shortness of breath and wheezing.   Cardiovascular: Negative for chest pain, palpitations and leg swelling.  Gastrointestinal: Negative for abdominal distention, abdominal pain, constipation, diarrhea, nausea and vomiting.  Genitourinary: Negative for dysuria, flank pain, frequency and urgency.  Musculoskeletal: Positive for gait problem.       Left knee pain   Skin: Negative for color change, pallor and rash.  Hematological: Does not bruise/bleed easily.  Psychiatric/Behavioral: Negative for agitation, confusion, hallucinations and sleep disturbance. The patient is not nervous/anxious.     Immunization History  Administered Date(s) Administered  . Influenza Whole 02/21/2010, 02/21/2014  . PPD Test 05/30/2016  . Td 01/16/2009  . Tdap 03/17/2014   Pertinent  Health Maintenance Due  Topic Date Due  . INFLUENZA VACCINE  04/22/2017 (Originally 12/19/2015)  . DEXA SCAN  04/22/2017 (Originally 12/11/2015)  . PNA vac Low Risk Adult (1 of 2 - PCV13) 04/22/2017 (Originally 12/11/2015)  . MAMMOGRAM  03/03/2018  . COLONOSCOPY  09/23/2018  . PAP SMEAR  03/04/2019    Vitals:   06/04/16 1200  BP: 124/77  Pulse: 88  Resp: 20  Temp: 98.1 F (36.7 C)  SpO2: 97%  Weight: 186 lb (84.4 kg)  Height: 5\' 11"  (1.803 m)   Body mass index is 25.94 kg/m. Physical Exam  Constitutional: She is oriented to person, place, and time. She appears well-developed and well-nourished. No distress.  HENT:   Head: Normocephalic.  Mouth/Throat: Oropharynx is clear and moist. No oropharyngeal exudate.  Eyes: Conjunctivae and EOM are normal. Pupils are equal, round, and reactive to light. Right eye exhibits no discharge. Left eye exhibits no discharge. No scleral icterus.  Neck: Normal range of motion. No JVD present. No thyromegaly present.  Cardiovascular: Normal rate, regular rhythm and intact distal pulses.  Exam reveals no gallop and no friction rub.   No murmur heard. Pulmonary/Chest: Effort normal and breath sounds normal. No respiratory distress. She has no wheezes. She has no rales.  Abdominal: Soft. Bowel sounds are normal. She exhibits no distension. There is no tenderness. There is no rebound and no guarding.  Musculoskeletal: She exhibits edema. She exhibits no tenderness or deformity.  Moves x 4 extremities except left knee limited ROM due pain.   Lymphadenopathy:    She has no cervical adenopathy.  Neurological: She is oriented to person, place, and time.  Skin: Skin is warm and dry. No erythema. No pallor.  Left knee surgical incision dressing dry, clean and intact . Previous skin redness resolved. Site without any warmness or drainage.   Psychiatric: She has a normal mood and affect.    Labs reviewed:  Recent Labs  04/22/16 1106 05/16/16 0948 05/28/16 0834  NA 138 141 136  K 4.4 4.2 3.9  CL 103 103 103  CO2 28 29 25   GLUCOSE 91 91 157*  BUN 18 15 8   CREATININE 0.88 0.85 0.96  CALCIUM 9.4 9.9 8.3*    Recent Labs  01/28/16 0103 04/22/16 1106  AST 18 8  ALT 12* 9  ALKPHOS 75 64  BILITOT 0.5 0.3  PROT 7.3 7.1  ALBUMIN 3.7 3.9    Recent Labs  05/16/16 0948 05/28/16 0834 05/29/16 0447 05/30/16 0539  WBC 9.8 7.8 9.3 9.6  NEUTROABS 7.1  --   --   --   HGB 13.1 9.6* 9.5* 9.8*  HCT 39.6 30.0* 29.2* 30.4*  MCV 88.4 89.0 89.6 88.6  PLT 300 191 172 215   Lab Results  Component Value Date   TSH 1.24 05/11/2010   Lab Results  Component Value Date    HGBA1C 5.9 04/22/2014   Lab Results  Component Value Date   CHOL 233 (H) 04/22/2016   HDL 65.40 04/22/2016   LDLCALC 148 (H) 04/22/2016   LDLDIRECT 133.0 02/26/2012   TRIG 98.0 04/22/2016   CHOLHDL 4 04/22/2016   Assessment/Plan  Redness Afebrile. Previous redness site has improved. No signs/symptoms of infections. Incision dry, clean and intact.Wound care nurse to continue to monitor. Follow up with Ortho as directed. Continue current pain regimen.CBC, BMP already ordered by MD for 06/05/2016.    Edema Trace-1+ edema. Add bilateral knee high Ted hose on in the morning and off at bedtime.    Family/ staff Communication: Reviewed plan of care with patient and facility Nurse supervisor.   Labs/tests ordered: None

## 2016-06-06 DIAGNOSIS — K317 Polyp of stomach and duodenum: Secondary | ICD-10-CM | POA: Diagnosis not present

## 2016-06-06 DIAGNOSIS — K219 Gastro-esophageal reflux disease without esophagitis: Secondary | ICD-10-CM | POA: Diagnosis not present

## 2016-06-06 DIAGNOSIS — M6281 Muscle weakness (generalized): Secondary | ICD-10-CM | POA: Diagnosis not present

## 2016-06-06 DIAGNOSIS — M79609 Pain in unspecified limb: Secondary | ICD-10-CM | POA: Diagnosis not present

## 2016-06-06 DIAGNOSIS — Z471 Aftercare following joint replacement surgery: Secondary | ICD-10-CM | POA: Diagnosis not present

## 2016-06-06 DIAGNOSIS — Z96652 Presence of left artificial knee joint: Secondary | ICD-10-CM | POA: Diagnosis not present

## 2016-06-06 DIAGNOSIS — K589 Irritable bowel syndrome without diarrhea: Secondary | ICD-10-CM | POA: Diagnosis not present

## 2016-06-10 DIAGNOSIS — M79609 Pain in unspecified limb: Secondary | ICD-10-CM | POA: Diagnosis not present

## 2016-06-11 DIAGNOSIS — M1712 Unilateral primary osteoarthritis, left knee: Secondary | ICD-10-CM | POA: Diagnosis not present

## 2016-06-12 ENCOUNTER — Non-Acute Institutional Stay (SKILLED_NURSING_FACILITY): Payer: Medicare Other | Admitting: Family

## 2016-06-12 DIAGNOSIS — M62838 Other muscle spasm: Secondary | ICD-10-CM | POA: Diagnosis not present

## 2016-06-12 DIAGNOSIS — K5901 Slow transit constipation: Secondary | ICD-10-CM

## 2016-06-12 DIAGNOSIS — Z96652 Presence of left artificial knee joint: Secondary | ICD-10-CM

## 2016-06-12 DIAGNOSIS — K219 Gastro-esophageal reflux disease without esophagitis: Secondary | ICD-10-CM

## 2016-06-12 DIAGNOSIS — R2681 Unsteadiness on feet: Secondary | ICD-10-CM | POA: Diagnosis not present

## 2016-06-12 NOTE — Progress Notes (Signed)
Location:  [facility] Room Number: [place] of Service:  SNF [phone])  Provider: Marlowe Sax FNP-C   PCP: Hoyt Koch, MD Patient Care Team: Hoyt Koch, MD as PCP - General (Internal Medicine)  Extended Emergency Contact Information Primary Emergency Contact: Clarene Duke Address: [address]          [place], [place] [address] Johnnette Litter of [place] Phone: [phone] Mobile Phone: [phone] Relation: Spouse Secondary Emergency Contact: [place] of [place] Mobile Phone: [phone] Relation: Son  Code Status: Full Code  Goals of care:  Advanced Directive information Advanced Directives 05/16/2016  Does Patient Have a Medical Advance Directive? Yes  Type of Paramedic of [place];Living will  Does patient want to make changes to medical advance directive? No - Patient declined  Copy of [place] in Chart? No - copy requested  Would patient like information on creating a medical advance directive? -     Allergies  Allergen Reactions  . Acetaminophen-Codeine Nausea And Vomiting  . Darvocet [Propoxyphene N-Acetaminophen] Nausea And Vomiting  . Morphine And Related Nausea And Vomiting  . Vioxx [Rofecoxib] Hives and Swelling    Chief Complaint  Patient presents with  . Discharge Note    discharge home     HPI:  66 y.o. female seen today at [facility] for discharge home. She was here for short term rehabilitation post hospital admission  from 05/27/2016-05/30/2016 with left knee OA. She underwent left total knee arthroplasty on 05/27/2016.She has a medical history of GERD, IBS among other conditions. She is seen in her room today with husband and her brother at bedside. She denies any acute issues this visit. She was seen by [facility]  Orthopaedic Dr. Frederik Pear new hard script written for Percocet 06/11/2016.Follow up in 4  weeks.She has had unremarkable stay here in rehab. She has worked well with PT/OT now stable for discharge home.She does not require any Home health service. No DME required she has worked with PT/OT without any assistance. Prescription medication will be written x 1 month then patient to follow up with PCP in 1-2 weeks.Facility staff report no new concerns.     Past Medical History:  Diagnosis Date  . Fundic gland polyps of stomach, benign   . GERD (gastroesophageal reflux disease)   . IBS (irritable bowel syndrome)   . Personal history of colonic polyps - adenoma 09/22/2013  . PONV (postoperative nausea and vomiting)   . Reflux     Past Surgical History:  Procedure Laterality Date  . COLONOSCOPY    . ESOPHAGOGASTRODUODENOSCOPY    . HIP ARTHROSCOPY Left 1997  . JOINT REPLACEMENT Bilateral [id number]   hip  . KNEE ARTHROPLASTY Left 12/2015  . KNEE ARTHROSCOPY Left 1996  . TONSILLECTOMY AND ADENOIDECTOMY  1957  . TOTAL KNEE ARTHROPLASTY Bilateral 05/27/2016   Procedure: LEFT TOTAL KNEE ARTHROPLASTY WITH RIGHT KNEE ARTHROSCOPY;  Surgeon: Frederik Pear, MD;  Location: [facility];  Service: Orthopedics;  Laterality: Bilateral;      reports that she has never smoked. She has never used smokeless tobacco. She reports that she drinks about 0.6 oz of alcohol per week . She reports that she does not use drugs. Social History   Social History  . Marital status: Married    Spouse name: N/A  . Number of children: 2  . Years of education: N/A   Occupational History  .  Retired Retired   Social History Main Topics  . Smoking status: Never Smoker  . Smokeless tobacco: Never Used  . Alcohol use 0.6 oz/week    1 Glasses of wine per week     Comment: rarely  . Drug use: No  . Sexual activity: Not on file   Other Topics Concern  . Not on file   Social History Narrative  . No narrative on file    Allergies  Allergen Reactions  . Acetaminophen-Codeine Nausea And Vomiting  . Darvocet  [Propoxyphene N-Acetaminophen] Nausea And Vomiting  . Morphine And Related Nausea And Vomiting  . Vioxx [Rofecoxib] Hives and Swelling    Pertinent  Health Maintenance Due  Topic Date Due  . INFLUENZA VACCINE  04/22/2017 (Originally 12/19/2015)  . DEXA SCAN  04/22/2017 (Originally 12/11/2015)  . PNA vac Low Risk Adult (1 of 2 - PCV13) 04/22/2017 (Originally 12/11/2015)  . MAMMOGRAM  03/03/2018  . COLONOSCOPY  09/23/2018  . PAP SMEAR  03/04/2019    Medications: Allergies as of 06/12/2016      Reactions   Acetaminophen-codeine Nausea And Vomiting   Darvocet [propoxyphene N-acetaminophen] Nausea And Vomiting   Morphine And Related Nausea And Vomiting   Vioxx [rofecoxib] Hives, Swelling      Medication List       Accurate as of 06/12/16  3:40 PM. Always use your most recent med list.          acetaminophen 500 MG tablet Commonly known as:  TYLENOL Take 500 mg by mouth every 6 (six) hours as needed for headache.   apixaban 2.5 MG Tabs tablet Commonly known as:  ELIQUIS Take 1 tablet (2.5 mg total) by mouth 2 (two) times daily.   famotidine 10 MG chewable tablet Commonly known as:  PEPCID AC Chew 10 mg by mouth at bedtime as needed for heartburn.   methocarbamol 500 MG tablet Commonly known as:  ROBAXIN Take 1 tablet (500 mg total) by mouth 2 (two) times daily with a meal.   ondansetron 4 MG tablet Commonly known as:  ZOFRAN Take 4 mg by mouth every 8 (eight) hours as needed for nausea or vomiting.   oxyCODONE-acetaminophen 5-325 MG tablet Commonly known as:  ROXICET Take 1-2 tablets by mouth every 4 (four) hours as needed.   senna 8.6 MG tablet Commonly known as:  SENOKOT Take 1 tablet by mouth at bedtime.       Review of Systems  Constitutional: Negative for activity change, appetite change, chills and fever.  HENT: Negative for congestion, rhinorrhea, sinus pain, sinus pressure, sneezing and sore throat.   Eyes: Negative.   Respiratory: Negative for cough,  chest tightness, shortness of breath and wheezing.   Cardiovascular: Negative for chest pain, palpitations and leg swelling.  Gastrointestinal: Negative for abdominal distention, abdominal pain, constipation, diarrhea, nausea and vomiting.  Endocrine: Negative for cold intolerance, heat intolerance, polydipsia, polyphagia and polyuria.  Genitourinary: Negative for dysuria, flank pain, frequency and urgency.  Musculoskeletal: Positive for gait problem.       Left knee pain and spasm    Skin: Negative for color change, pallor and rash.  Hematological: Does not bruise/bleed easily.  Psychiatric/Behavioral: Negative for agitation, confusion, hallucinations and sleep disturbance. The patient is not nervous/anxious.     Vitals:   06/12/16 1115  BP: 134/76  Pulse: 76  Resp: 18  Temp: 97.2 F (36.2 C)  SpO2: 94%  Weight: 186 lb (84.4 kg)  Height: 5\' 11"  (1.803 m)   Body mass  index is 25.94 kg/m. Physical Exam  Constitutional: She is oriented to person, place, and time. She appears well-developed and well-nourished. No distress.  HENT:  Head: Normocephalic.  Mouth/Throat: Oropharynx is clear and moist. No oropharyngeal exudate.  Eyes: Conjunctivae and EOM are normal. Pupils are equal, round, and reactive to light. Right eye exhibits no discharge. Left eye exhibits no discharge. No scleral icterus.  Neck: Normal range of motion. No JVD present. No thyromegaly present.  Cardiovascular: Normal rate, regular rhythm and intact distal pulses.  Exam reveals no gallop and no friction rub.   No murmur heard. Pulmonary/Chest: Effort normal and breath sounds normal. No respiratory distress. She has no wheezes. She has no rales.  Abdominal: Soft. Bowel sounds are normal. She exhibits no distension. There is no tenderness. There is no rebound and no guarding.  Musculoskeletal: She exhibits no edema, tenderness or deformity.  Moves x 4 extremities except left knee limited ROM due pain.     Lymphadenopathy:    She has no cervical adenopathy.  Neurological: She is oriented to person, place, and time.  Skin: Skin is warm and dry. No erythema. No pallor.  Left knee surgical incision dressing dry, clean and intact.Surrounding skin without any signs of infections.   Psychiatric: She has a normal mood and affect.    Labs reviewed: Basic Metabolic Panel:  Recent Labs  04/22/16 1106 05/16/16 0948 05/28/16 0834  NA 138 141 136  K 4.4 4.2 3.9  CL 103 103 103  CO2 28 29 25   GLUCOSE 91 91 157*  BUN 18 15 8   CREATININE 0.88 0.85 0.96  CALCIUM 9.4 9.9 8.3*   Liver Function Tests:  Recent Labs  01/28/16 0103 04/22/16 1106  AST 18 8  ALT 12* 9  ALKPHOS 75 64  BILITOT 0.5 0.3  PROT 7.3 7.1  ALBUMIN 3.7 3.9    Recent Labs  01/28/16 0103  LIPASE 30    CBC:  Recent Labs  05/16/16 0948 05/28/16 0834 05/29/16 0447 05/30/16 0539  WBC 9.8 7.8 9.3 9.6  NEUTROABS 7.1  --   --   --   HGB 13.1 9.6* 9.5* 9.8*  HCT 39.6 30.0* 29.2* 30.4*  MCV 88.4 89.0 89.6 88.6  PLT 300 191 172 215    Assessment/Plan:   1. Unsteady gait Status post left Total knee arthroplasty 05/27/2016. Has worked well with PT/OT now stable for discharge home.Fall and safety precaution.   2. Gastroesophageal reflux disease without esophagitis Asymptomatic.Continue Pepcid 10 mg daily as needed.   3. Slow transit constipation Current regimen effective.Continue to encouraged oral intake and hydration.  4. Muscle spasm Continue on Robaxin 500 mg Tablet every 6 hours as needed.   5. Status post total left knee replacement Status post left total knee arthroplasty on 05/27/2016.Was seen by [facility]  Orthopaedic Dr. Frederik Pear new hard script written for Percocet 06/11/2016.Follow up in 4 weeks. Continue Robaxin 500 mg tablet every 6 hours as needed for spasm. Continue eliquis for DVT prophylaxis.No DME required has own walker.    Patient is being discharged with the following home health  services:   None required.    Patient is being discharged with the following durable medical equipment:   None required states has own FWW at home.    Patient has been advised to f/u with their PCP in 1-2 weeks to for a transitions of care visit.  Social services at their facility was responsible for arranging this appointment. Pt was provided with adequate prescriptions  of noncontrolled medications to reach the scheduled appointment . Percocet was prescribed by patient's Orthopaedic specialist  06/11/2016.   Future labs/tests needed:  CBC, BMP in 1-2 weeks with PCP

## 2016-07-30 DIAGNOSIS — S83206A Unspecified tear of unspecified meniscus, current injury, right knee, initial encounter: Secondary | ICD-10-CM | POA: Diagnosis not present

## 2017-01-09 NOTE — Addendum Note (Signed)
Addendum  created 01/09/17 0919 by Roberts Gaudy, MD   Sign clinical note

## 2017-04-15 DIAGNOSIS — Z96652 Presence of left artificial knee joint: Secondary | ICD-10-CM | POA: Diagnosis not present

## 2017-04-15 DIAGNOSIS — Z09 Encounter for follow-up examination after completed treatment for conditions other than malignant neoplasm: Secondary | ICD-10-CM | POA: Diagnosis not present

## 2017-04-15 DIAGNOSIS — M1711 Unilateral primary osteoarthritis, right knee: Secondary | ICD-10-CM | POA: Diagnosis not present

## 2017-06-12 ENCOUNTER — Encounter (HOSPITAL_COMMUNITY): Payer: Self-pay | Admitting: Emergency Medicine

## 2017-06-12 ENCOUNTER — Other Ambulatory Visit: Payer: Self-pay

## 2017-06-12 DIAGNOSIS — N3000 Acute cystitis without hematuria: Secondary | ICD-10-CM | POA: Insufficient documentation

## 2017-06-12 DIAGNOSIS — Z96643 Presence of artificial hip joint, bilateral: Secondary | ICD-10-CM | POA: Insufficient documentation

## 2017-06-12 DIAGNOSIS — K529 Noninfective gastroenteritis and colitis, unspecified: Secondary | ICD-10-CM | POA: Diagnosis not present

## 2017-06-12 DIAGNOSIS — Z96653 Presence of artificial knee joint, bilateral: Secondary | ICD-10-CM | POA: Insufficient documentation

## 2017-06-12 DIAGNOSIS — Z7901 Long term (current) use of anticoagulants: Secondary | ICD-10-CM | POA: Insufficient documentation

## 2017-06-12 DIAGNOSIS — R1110 Vomiting, unspecified: Secondary | ICD-10-CM | POA: Diagnosis present

## 2017-06-12 DIAGNOSIS — Z79899 Other long term (current) drug therapy: Secondary | ICD-10-CM | POA: Diagnosis not present

## 2017-06-12 DIAGNOSIS — R112 Nausea with vomiting, unspecified: Secondary | ICD-10-CM | POA: Diagnosis not present

## 2017-06-12 DIAGNOSIS — R197 Diarrhea, unspecified: Secondary | ICD-10-CM | POA: Diagnosis not present

## 2017-06-12 LAB — CBC
HCT: 41.3 % (ref 36.0–46.0)
Hemoglobin: 13.5 g/dL (ref 12.0–15.0)
MCH: 28.3 pg (ref 26.0–34.0)
MCHC: 32.7 g/dL (ref 30.0–36.0)
MCV: 86.6 fL (ref 78.0–100.0)
PLATELETS: 228 10*3/uL (ref 150–400)
RBC: 4.77 MIL/uL (ref 3.87–5.11)
RDW: 13.1 % (ref 11.5–15.5)
WBC: 8.3 10*3/uL (ref 4.0–10.5)

## 2017-06-12 LAB — URINALYSIS, ROUTINE W REFLEX MICROSCOPIC
Bilirubin Urine: NEGATIVE
Glucose, UA: NEGATIVE mg/dL
Ketones, ur: 5 mg/dL — AB
Nitrite: NEGATIVE
PROTEIN: 30 mg/dL — AB
Specific Gravity, Urine: 1.028 (ref 1.005–1.030)
pH: 5 (ref 5.0–8.0)

## 2017-06-12 MED ORDER — ONDANSETRON 4 MG PO TBDP: 4.0000 mg | ORAL_TABLET | Freq: Once | ORAL | Status: DC | PRN

## 2017-06-12 NOTE — ED Triage Notes (Signed)
Pt reports n/v/d since 4pm along w/ generalized weakness.  Pt denies loc, sob, chest pain.  Negative NIH scale

## 2017-06-13 ENCOUNTER — Encounter (HOSPITAL_COMMUNITY): Payer: Self-pay | Admitting: Emergency Medicine

## 2017-06-13 ENCOUNTER — Emergency Department (HOSPITAL_COMMUNITY)
Admission: EM | Admit: 2017-06-13 | Discharge: 2017-06-13 | Disposition: A | Discharge status: Home / Self Care | Payer: Medicare Other | Attending: Emergency Medicine | Admitting: Emergency Medicine

## 2017-06-13 ENCOUNTER — Other Ambulatory Visit: Payer: Self-pay

## 2017-06-13 DIAGNOSIS — K529 Noninfective gastroenteritis and colitis, unspecified: Secondary | ICD-10-CM

## 2017-06-13 DIAGNOSIS — N3000 Acute cystitis without hematuria: Secondary | ICD-10-CM

## 2017-06-13 LAB — COMPREHENSIVE METABOLIC PANEL
ALT: 12 U/L — AB (ref 14–54)
AST: 32 U/L (ref 15–41)
Albumin: 3.8 g/dL (ref 3.5–5.0)
Alkaline Phosphatase: 72 U/L (ref 38–126)
Anion gap: 14 (ref 5–15)
BILIRUBIN TOTAL: 1.3 mg/dL — AB (ref 0.3–1.2)
BUN: 22 mg/dL — AB (ref 6–20)
CO2: 19 mmol/L — ABNORMAL LOW (ref 22–32)
CREATININE: 1.05 mg/dL — AB (ref 0.44–1.00)
Calcium: 9.2 mg/dL (ref 8.9–10.3)
Chloride: 105 mmol/L (ref 101–111)
GFR calc Af Amer: >60 mL/min (ref >60)
GFR, EST NON AFRICAN AMERICAN: 54 mL/min — AB (ref >60)
Glucose, Bld: 169 mg/dL — ABNORMAL HIGH (ref 65–99)
POTASSIUM: 4.3 mmol/L (ref 3.5–5.1)
Sodium: 138 mmol/L (ref 135–145)
TOTAL PROTEIN: 7.4 g/dL (ref 6.5–8.1)

## 2017-06-13 LAB — LIPASE, BLOOD: Lipase: 30 U/L (ref 11–51)

## 2017-06-13 MED ORDER — ONDANSETRON 8 MG PO TBDP: ORAL_TABLET | ORAL | 0 refills | Status: DC

## 2017-06-13 MED ORDER — CEFTRIAXONE SODIUM 1 G IJ SOLR
1.0000 g | Freq: Once | INTRAMUSCULAR | Status: AC
  Administered 2017-06-13: 1 g via INTRAVENOUS
  Filled 2017-06-13: qty 10

## 2017-06-13 MED ORDER — CEPHALEXIN 500 MG PO CAPS: 500.0000 mg | ORAL_CAPSULE | Freq: Four times a day (QID) | ORAL | 0 refills | Status: DC

## 2017-06-13 MED ORDER — ONDANSETRON HCL 4 MG/2ML IJ SOLN
4.0000 mg | Freq: Once | INTRAMUSCULAR | Status: AC
  Administered 2017-06-13: 4 mg via INTRAVENOUS
  Filled 2017-06-13: qty 2

## 2017-06-13 MED ORDER — SODIUM CHLORIDE 0.9 % IV BOLUS (SEPSIS)
1000.0000 mL | Freq: Once | INTRAVENOUS | Status: AC
  Administered 2017-06-13: 1000 mL via INTRAVENOUS

## 2017-06-13 MED ORDER — DICYCLOMINE HCL 10 MG/ML IM SOLN
20.0000 mg | Freq: Once | INTRAMUSCULAR | Status: AC
  Administered 2017-06-13: 20 mg via INTRAMUSCULAR
  Filled 2017-06-13: qty 2

## 2017-06-13 NOTE — ED Provider Notes (Signed)
[facility] EMERGENCY DEPARTMENT Provider Note   CSN: [id number] Arrival date & time: 06/12/17  2216     History   Chief Complaint Chief Complaint  Patient presents with  . Emesis  . Weakness    HPI Jennifer Frank is a 67 y.o. female.  The history is provided by the patient.  Emesis   This is a new problem. The current episode started 12 to 24 hours ago. The problem occurs 5 to 10 times per day. The problem has not changed since onset.The emesis has an appearance of stomach contents. Associated symptoms include diarrhea. Pertinent negatives include no abdominal pain, no arthralgias, no chills, no cough, no sweats and no URI. Risk factors include ill contacts.  Feels globally fatigued.  No focal weakness.  No changes in vision or speech.    Past Medical History:  Diagnosis Date  . Fundic gland polyps of stomach, benign   . GERD (gastroesophageal reflux disease)   . IBS (irritable bowel syndrome)   . Personal history of colonic polyps - adenoma 09/22/2013  . PONV (postoperative nausea and vomiting)   . Reflux     Patient Active Problem List   Diagnosis Date Noted  . Primary localized osteoarthritis of left knee 05/27/2016  . Primary osteoarthritis of left knee 05/26/2016  . Pre-operative cardiovascular examination 04/22/2016  . Hearing loss 04/22/2014  . Varicose veins of lower extremities with other complications 14/48/1856  . Routine health maintenance 03/01/2012  . Hyperlipidemia 02/16/2007  . GERD 02/16/2007    Past Surgical History:  Procedure Laterality Date  . COLONOSCOPY    . ESOPHAGOGASTRODUODENOSCOPY    . HIP ARTHROSCOPY Left 1997  . JOINT REPLACEMENT Bilateral [id number]   hip  . KNEE ARTHROPLASTY Left 12/2015  . KNEE ARTHROSCOPY Left 1996  . TONSILLECTOMY AND ADENOIDECTOMY  1957  . TOTAL KNEE ARTHROPLASTY Bilateral 05/27/2016   Procedure: LEFT TOTAL KNEE ARTHROPLASTY WITH RIGHT KNEE ARTHROSCOPY;  Surgeon: Frederik Pear, MD;  Location:  [facility];  Service: Orthopedics;  Laterality: Bilateral;    OB History    No data available       Home Medications    Prior to Admission medications   Medication Sig Start Date End Date Taking? Authorizing Provider  acetaminophen (TYLENOL) 500 MG tablet Take 500 mg by mouth every 6 (six) hours as needed for headache.     [provider]  apixaban (ELIQUIS) 2.5 MG TABS tablet Take 1 tablet (2.5 mg total) by mouth 2 (two) times daily. 05/29/16   Leighton Parody, PA-C  famotidine (PEPCID AC) 10 MG chewable tablet Chew 10 mg by mouth at bedtime as needed for heartburn.    [provider]  methocarbamol (ROBAXIN) 500 MG tablet Take 1 tablet (500 mg total) by mouth 2 (two) times daily with a meal. 05/29/16   Leighton Parody, PA-C  ondansetron (ZOFRAN) 4 MG tablet Take 4 mg by mouth every 8 (eight) hours as needed for nausea or vomiting.    [provider]  oxyCODONE-acetaminophen (ROXICET) 5-325 MG tablet Take 1-2 tablets by mouth every 4 (four) hours as needed. 05/29/16   Leighton Parody, PA-C  senna (SENOKOT) 8.6 MG tablet Take 1 tablet by mouth at bedtime.    [provider]    Family History Family History  Problem Relation Age of Onset  . Deep vein thrombosis Mother   . Heart disease Mother        before age 69  . Hyperlipidemia Mother   .  Hypertension Mother   . Other Mother        varicose veins  . Heart attack Mother   . Peripheral vascular disease Mother   . Bleeding Disorder Mother   . Deep vein thrombosis Father   . Heart disease Father        before age 81  . Hyperlipidemia Father   . Hypertension Father   . Heart attack Father   . Bleeding Disorder Father   . AAA (abdominal aortic aneurysm) Father   . Cancer Brother   . Hyperlipidemia Brother     Social History Social History   Tobacco Use  . Smoking status: Never Smoker  . Smokeless tobacco: Never Used  Substance Use Topics  . Alcohol use: Yes    Alcohol/week: 0.6 oz     Types: 1 Glasses of wine per week    Comment: rarely  . Drug use: No     Allergies   Acetaminophen-codeine; Darvocet [propoxyphene n-acetaminophen]; Morphine and related; and Vioxx [rofecoxib]   Review of Systems Review of Systems  Constitutional: Positive for fatigue. Negative for chills.  Respiratory: Negative for cough.   Gastrointestinal: Positive for diarrhea, nausea and vomiting. Negative for abdominal pain.  Musculoskeletal: Negative for arthralgias.  All other systems reviewed and are negative.    Physical Exam Updated Vital Signs BP 118/60 (BP Location: Left Arm)   Pulse 99   Temp (!) 100.6 F (38.1 C) (Oral) Comment: RN notified  Resp 16   SpO2 95%   Physical Exam  Constitutional: She is oriented to person, place, and time. She appears well-developed and well-nourished. No distress.  HENT:  Head: Normocephalic and atraumatic.  Mouth/Throat: No oropharyngeal exudate.  Eyes: Conjunctivae are normal. Pupils are equal, round, and reactive to light.  Neck: Normal range of motion. Neck supple.  Cardiovascular: Normal rate, regular rhythm, normal heart sounds and intact distal pulses.  Pulmonary/Chest: Effort normal and breath sounds normal. No stridor. She has no wheezes. She has no rales.  Abdominal: Soft. Bowel sounds are normal. She exhibits no mass. There is no tenderness. There is no rebound and no guarding.  Musculoskeletal: Normal range of motion.  Neurological: She is alert and oriented to person, place, and time. She displays normal reflexes.  Skin: Skin is warm and dry. Capillary refill takes less than 2 seconds.  Psychiatric: She has a normal mood and affect.     ED Treatments / Results  Labs (all labs ordered are listed, but only abnormal results are displayed) Labs Reviewed  COMPREHENSIVE METABOLIC PANEL - Abnormal; Notable for the following components:      Result Value   CO2 19 (*)    Glucose, Bld 169 (*)    BUN 22 (*)    Creatinine, Ser 1.05  (*)    ALT 12 (*)    Total Bilirubin 1.3 (*)    GFR calc non Af Amer 54 (*)    All other components within normal limits  URINALYSIS, ROUTINE W REFLEX MICROSCOPIC - Abnormal; Notable for the following components:   APPearance CLOUDY (*)    Hgb urine dipstick SMALL (*)    Ketones, ur 5 (*)    Protein, ur 30 (*)    Leukocytes, UA MODERATE (*)    Bacteria, UA FEW (*)    Squamous Epithelial / LPF 6-30 (*)    All other components within normal limits  LIPASE, BLOOD  CBC    EKG  EKG Interpretation None       Radiology  No results found.  Procedures Procedures (including critical care time)  Medications Ordered in ED Medications  ondansetron (ZOFRAN-ODT) disintegrating tablet 4 mg (not administered)  ondansetron (ZOFRAN) injection 4 mg (not administered)  sodium chloride 0.9 % bolus 1,000 mL (not administered)  dicyclomine (BENTYL) injection 20 mg (not administered)  cefTRIAXone (ROCEPHIN) 1 g in dextrose 5 % 50 mL IVPB (not administered)      Final Clinical Impressions(s) / ED Diagnoses  PO challenged successfully in the department.  Viral n/v/d with superimposed UTI.  No signs of sepsis.  Abdominal exam is benign and reassuring.  Will send home on antibiotics and zofran.    Return for weakness, numbness, changes in vision or speech,  fevers > 100.4 unrelieved by medication, shortness of breath, intractable vomiting, or diarrhea, abdominal pain, Inability to tolerate liquids or food, cough, altered mental status or any concerns. No signs of systemic illness or infection. The patient is nontoxic-appearing on exam and vital signs are within normal limits.    I have reviewed the triage vital signs and the nursing notes. Pertinent labs &imaging results that were available during my care of the patient were reviewed by me and considered in my medical decision making (see chart for details).  After history, exam, and medical workup I feel the patient has been appropriately  medically screened and is safe for discharge home. Pertinent diagnoses were discussed with the patient. Patient was given return precautions.      Orphia Mctigue, MD 06/13/17 0530

## 2017-06-13 NOTE — ED Notes (Signed)
PO challenge started, no sign of nausea or vomiting at this time.

## 2017-06-14 IMAGING — CR DG CHEST 2V
2 series · 2 of 2 positions shown · non-contrast
Comparison: None.

CLINICAL DATA: Preop knee replacement

EXAM:
CHEST  2 VIEW

[w chest pa]
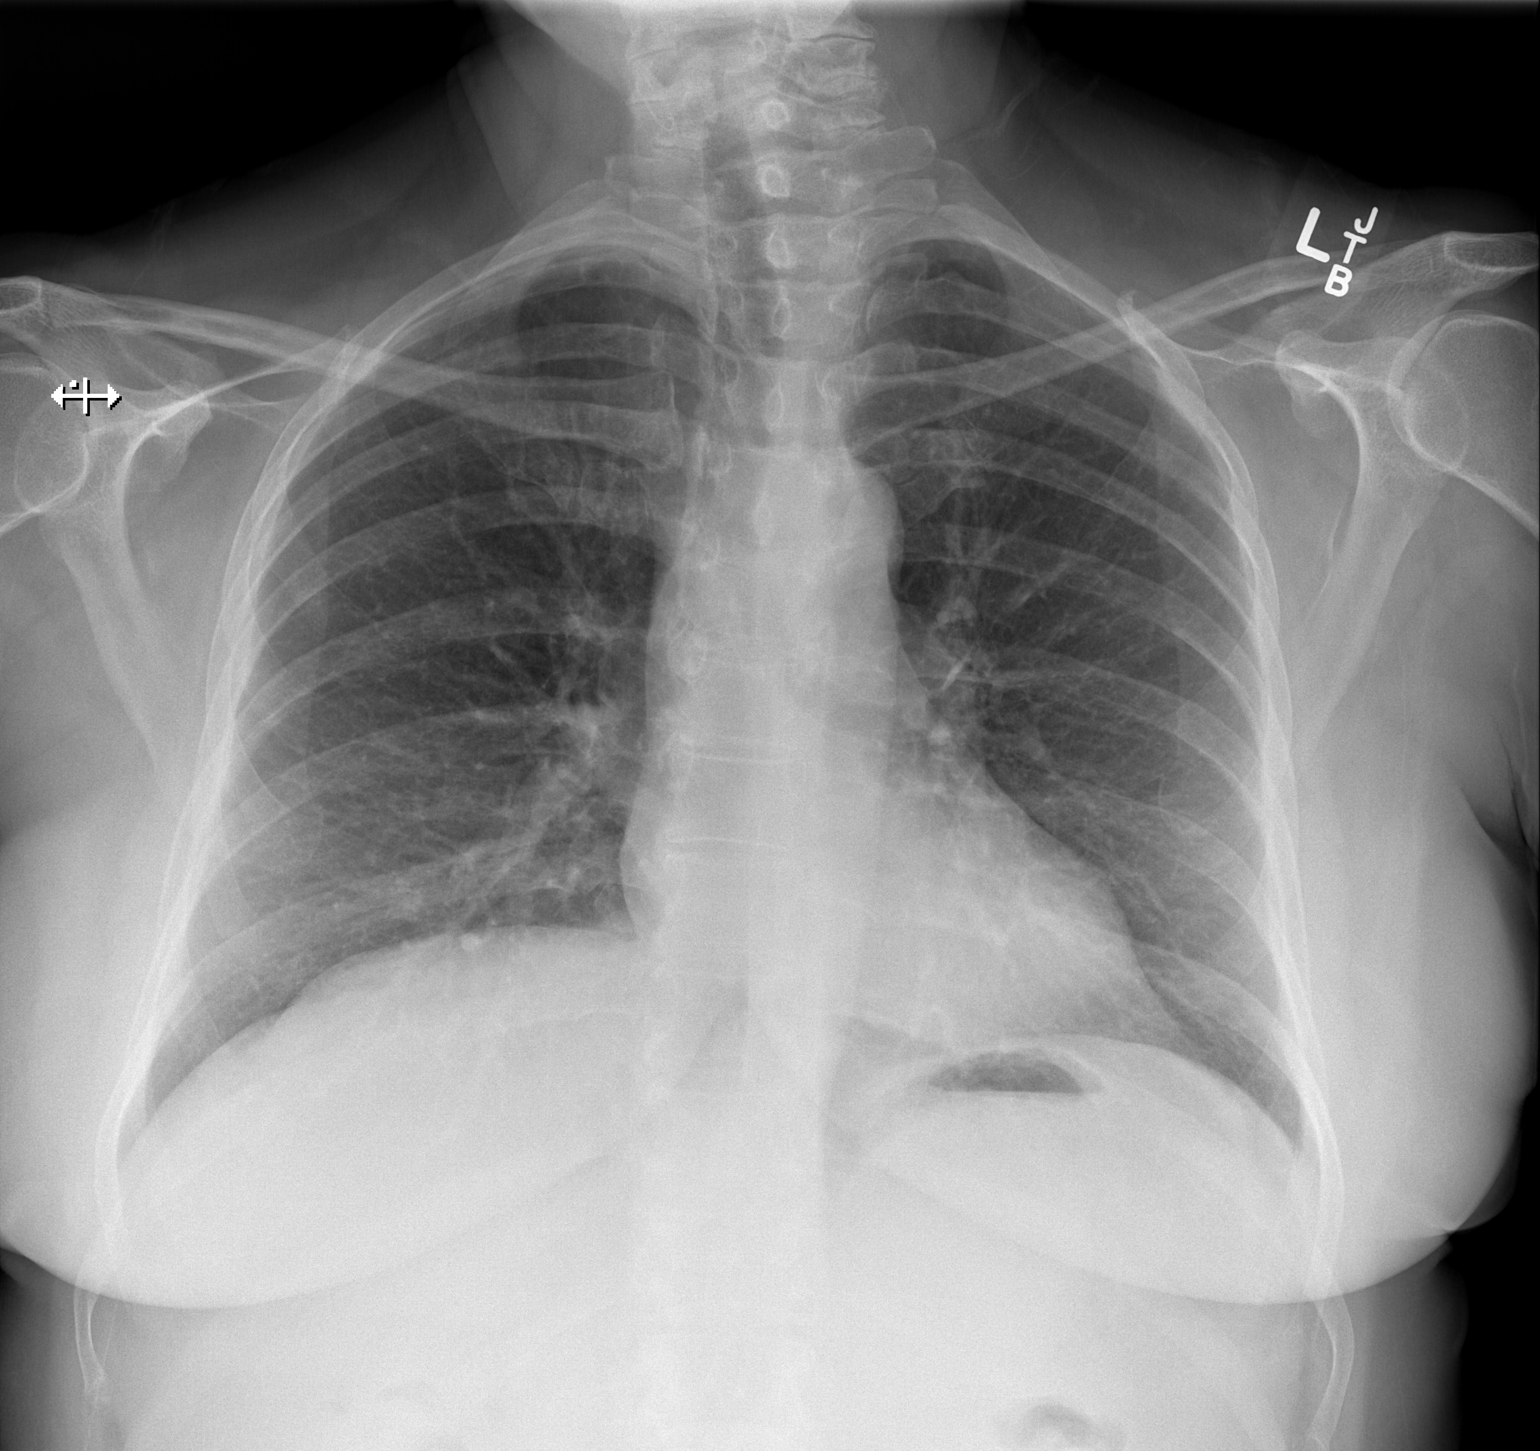

[w chest lat]
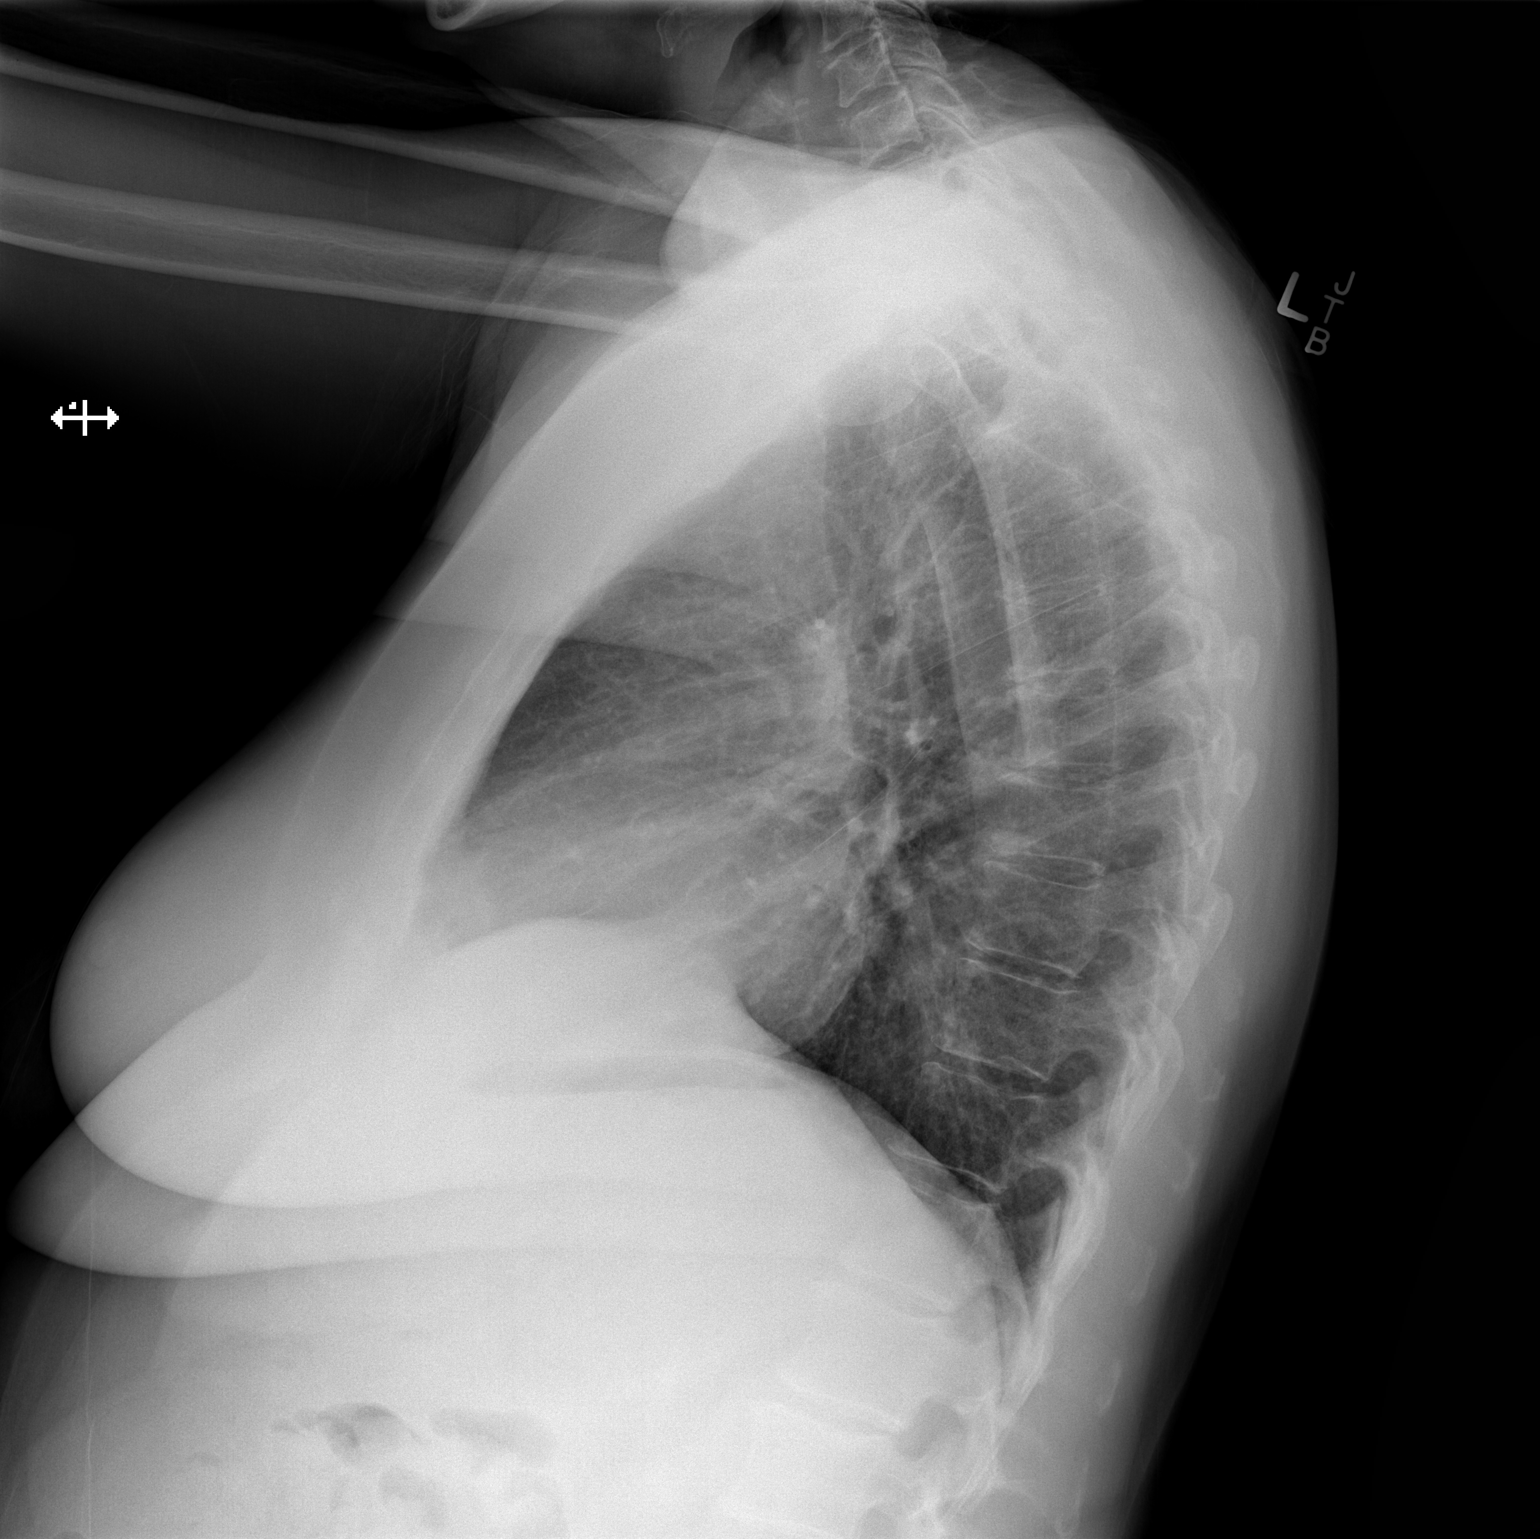

[2 of 2 positions shown; findings below may reference images not displayed]

FINDINGS: Heart and mediastinal contours are within normal limits. No focal
opacities or effusions. No acute bony abnormality.
IMPRESSION: No active cardiopulmonary disease.

## 2017-06-23 ENCOUNTER — Encounter: Payer: Self-pay | Admitting: Family

## 2017-06-23 ENCOUNTER — Other Ambulatory Visit (INDEPENDENT_AMBULATORY_CARE_PROVIDER_SITE_OTHER): Payer: Medicare Other

## 2017-06-23 ENCOUNTER — Ambulatory Visit: Payer: Medicare Other | Admitting: Family

## 2017-06-23 VITALS — BP 120/66 | HR 84 | Temp 98.3°F | Ht 66.0 in | Wt 196.0 lb

## 2017-06-23 DIAGNOSIS — R110 Nausea: Secondary | ICD-10-CM | POA: Diagnosis not present

## 2017-06-23 DIAGNOSIS — N390 Urinary tract infection, site not specified: Secondary | ICD-10-CM

## 2017-06-23 LAB — CBC WITH DIFFERENTIAL/PLATELET
BASOS ABS: 0.1 10*3/uL (ref 0.0–0.1)
Basophils Relative: 0.8 % (ref 0.0–3.0)
EOS ABS: 0.1 10*3/uL (ref 0.0–0.7)
Eosinophils Relative: 1.2 % (ref 0.0–5.0)
HCT: 40.9 % (ref 36.0–46.0)
Hemoglobin: 13.5 g/dL (ref 12.0–15.0)
LYMPHS ABS: 1.5 10*3/uL (ref 0.7–4.0)
Lymphocytes Relative: 15.6 % (ref 12.0–46.0)
MCHC: 33 g/dL (ref 30.0–36.0)
MCV: 84 fl (ref 78.0–100.0)
MONO ABS: 1 10*3/uL (ref 0.1–1.0)
Monocytes Relative: 10.1 % (ref 3.0–12.0)
NEUTROS ABS: 7 10*3/uL (ref 1.4–7.7)
NEUTROS PCT: 72.3 % (ref 43.0–77.0)
PLATELETS: 283 10*3/uL (ref 150.0–400.0)
RBC: 4.87 Mil/uL (ref 3.87–5.11)
RDW: 13.8 % (ref 11.5–15.5)
WBC: 9.7 10*3/uL (ref 4.0–10.5)

## 2017-06-23 LAB — URINALYSIS, ROUTINE W REFLEX MICROSCOPIC
Bilirubin Urine: NEGATIVE
KETONES UR: NEGATIVE
NITRITE: NEGATIVE
PH: 6 (ref 5.0–8.0)
RBC / HPF: NONE SEEN (ref 0–?)
SPECIFIC GRAVITY, URINE: 1.025 (ref 1.000–1.030)
Total Protein, Urine: NEGATIVE
Urine Glucose: NEGATIVE
Urobilinogen, UA: 0.2 (ref 0.0–1.0)

## 2017-06-23 LAB — COMPREHENSIVE METABOLIC PANEL
ALK PHOS: 76 U/L (ref 39–117)
ALT: 12 U/L (ref 0–35)
AST: 11 U/L (ref 0–37)
Albumin: 4.2 g/dL (ref 3.5–5.2)
BUN: 8 mg/dL (ref 6–23)
CO2: 29 meq/L (ref 19–32)
CREATININE: 0.93 mg/dL (ref 0.40–1.20)
Calcium: 9.5 mg/dL (ref 8.4–10.5)
Chloride: 99 mEq/L (ref 96–112)
GFR: 64 mL/min (ref >60.00)
GLUCOSE: 110 mg/dL — AB (ref 70–99)
Potassium: 4.1 mEq/L (ref 3.5–5.1)
Sodium: 137 mEq/L (ref 135–145)
TOTAL PROTEIN: 8.1 g/dL (ref 6.0–8.3)
Total Bilirubin: 0.5 mg/dL (ref 0.2–1.2)

## 2017-06-23 MED ORDER — NITROFURANTOIN MONOHYD MACRO 100 MG PO CAPS: 100.0000 mg | ORAL_CAPSULE | Freq: Two times a day (BID) | ORAL | 0 refills | Status: DC

## 2017-06-23 MED ORDER — ONDANSETRON 4 MG PO TBDP: 4.0000 mg | ORAL_TABLET | Freq: Three times a day (TID) | ORAL | 0 refills | Status: DC | PRN

## 2017-06-23 NOTE — Progress Notes (Signed)
Jennifer Frank is a 67 y.o. female with the following history as recorded in [employer]:  Patient Active Problem List   Diagnosis Date Noted  . Primary localized osteoarthritis of left knee 05/27/2016  . Primary osteoarthritis of left knee 05/26/2016  . Pre-operative cardiovascular examination 04/22/2016  . Hearing loss 04/22/2014  . Varicose veins of lower extremities with other complications 41/93/7902  . Routine health maintenance 03/01/2012  . Hyperlipidemia 02/16/2007  . GERD 02/16/2007    Current Outpatient Medications  Medication Sig Dispense Refill  . nitrofurantoin, macrocrystal-monohydrate, (MACROBID) 100 MG capsule Take 1 capsule (100 mg total) by mouth 2 (two) times daily. 14 capsule 0  . ondansetron (ZOFRAN ODT) 4 MG disintegrating tablet Take 1 tablet (4 mg total) by mouth every 8 (eight) hours as needed for nausea or vomiting. 20 tablet 0   No current facility-administered medications for this visit.     Allergies: Acetaminophen-codeine; Darvocet [propoxyphene n-acetaminophen]; Morphine and related; and Vioxx [rofecoxib]  Past Medical History:  Diagnosis Date  . Fundic gland polyps of stomach, benign   . GERD (gastroesophageal reflux disease)   . IBS (irritable bowel syndrome)   . Personal history of colonic polyps - adenoma 09/22/2013  . PONV (postoperative nausea and vomiting)   . Reflux     Past Surgical History:  Procedure Laterality Date  . COLONOSCOPY    . ESOPHAGOGASTRODUODENOSCOPY    . HIP ARTHROSCOPY Left 1997  . JOINT REPLACEMENT Bilateral [phone]   hip  . KNEE ARTHROPLASTY Left 12/2015  . KNEE ARTHROSCOPY Left 1996  . TONSILLECTOMY AND ADENOIDECTOMY  1957  . TOTAL KNEE ARTHROPLASTY Bilateral 05/27/2016   Procedure: LEFT TOTAL KNEE ARTHROPLASTY WITH RIGHT KNEE ARTHROSCOPY;  Surgeon: Frederik Pear, MD;  Location: [facility];  Service: Orthopedics;  Laterality: Bilateral;    Family History  Problem Relation Age of Onset  . Deep vein thrombosis Mother   .  Heart disease Mother        before age 56  . Hyperlipidemia Mother   . Hypertension Mother   . Other Mother        varicose veins  . Heart attack Mother   . Peripheral vascular disease Mother   . Bleeding Disorder Mother   . Deep vein thrombosis Father   . Heart disease Father        before age 57  . Hyperlipidemia Father   . Hypertension Father   . Heart attack Father   . Bleeding Disorder Father   . AAA (abdominal aortic aneurysm) Father   . Cancer Brother   . Hyperlipidemia Brother     Social History   Tobacco Use  . Smoking status: Never Smoker  . Smokeless tobacco: Never Used  Substance Use Topics  . Alcohol use: Yes    Alcohol/week: 0.6 oz    Types: 1 Glasses of wine per week    Comment: rarely    Subjective:  Went to the ER on June 13, 2017 with concerns for nausea/vomiting; was found to have UTI along with viral illness; felt much better last week but notes that over the weekend, started back with vomiting/ nausea/ headache; notes that movement/ motion causes her to feel dizzy; in reviewing notes with patient, she did not start the Keflex that was given at the ER for suspected UTI; did not understand that she had a prescription for antibiotic; denies any burning on urination but admits that she has had increased urinary urgency in the past week. Patient does have a history  of recurrent UTIs- has been under urology care in the past;   Objective:  Vitals:   06/23/17 1536  BP: 120/66  Pulse: 84  Temp: 98.3 F (36.8 C)  TempSrc: Oral  SpO2: 98%  Weight: 196 lb 0.6 oz (88.9 kg)  Height: 5\' 6"  (1.676 m)    General: Well developed, well nourished, in no acute distress  Skin : Warm and dry.  Head: Normocephalic and atraumatic  Eyes: Sclera and conjunctiva clear; pupils round and reactive to light; extraocular movements intact  Ears: External normal; canals clear; tympanic membranes normal  Oropharynx: Pink, supple. No suspicious lesions  Neck: Supple without  thyromegaly, adenopathy  Lungs: Respirations unlabored; clear to auscultation bilaterally without wheeze, rales, rhonchi  CVS exam: normal rate and regular rhythm.  Abdomen: Soft; nontender; nondistended; normoactive bowel sounds; no masses or hepatosplenomegaly  Musculoskeletal: No deformities; no active joint inflammation  Extremities: No edema, cyanosis, clubbing  Vessels: Symmetric bilaterally  Neurologic: Alert and oriented; speech intact; face symmetrical; moves all extremities well; CNII-XII intact without focal deficit  Assessment:  1. Nausea     Plan:  Concern for persisting UTI as patient did not understand to start taking antibiotic from recent ER visit; will update labs today to include CBC, CMP, U/A and urine culture; will start patient on Macrobid 100 mg bid x 7 days; refill is given on Zofran as well; follow-up to be determined- if she does not have a UTI, will plan to update abdominal imaging;   No Follow-up on file.  Orders Placed This Encounter  Procedures  . Urine Culture    Standing Status:   Future    Number of Occurrences:   1    Standing Expiration Date:   06/23/2018  . CBC w/Diff    Standing Status:   Future    Number of Occurrences:   1    Standing Expiration Date:   06/23/2018  . Comprehensive metabolic panel    Standing Status:   Future    Number of Occurrences:   1    Standing Expiration Date:   06/23/2018  . Urinalysis    Standing Status:   Future    Number of Occurrences:   1    Standing Expiration Date:   06/23/2018    Requested Prescriptions   Signed Prescriptions Disp Refills  . ondansetron (ZOFRAN ODT) 4 MG disintegrating tablet 20 tablet 0    Sig: Take 1 tablet (4 mg total) by mouth every 8 (eight) hours as needed for nausea or vomiting.  . nitrofurantoin, macrocrystal-monohydrate, (MACROBID) 100 MG capsule 14 capsule 0    Sig: Take 1 capsule (100 mg total) by mouth 2 (two) times daily.

## 2017-06-24 LAB — URINE CULTURE
MICRO NUMBER:: 90147172
SPECIMEN QUALITY:: ADEQUATE

## 2017-10-27 ENCOUNTER — Ambulatory Visit (HOSPITAL_COMMUNITY)
Admission: EM | Admit: 2017-10-27 | Discharge: 2017-10-27 | Disposition: A | Discharge status: Home / Self Care | Payer: Medicare Other | Attending: Family Medicine | Admitting: Family Medicine

## 2017-10-27 ENCOUNTER — Encounter (HOSPITAL_COMMUNITY): Payer: Self-pay | Admitting: Emergency Medicine

## 2017-10-27 DIAGNOSIS — J069 Acute upper respiratory infection, unspecified: Secondary | ICD-10-CM

## 2017-10-27 NOTE — ED Triage Notes (Signed)
Pt c/o headache, states shes swallowing a lot of secretions that make her nauseated. Pt also c/o fatigue.

## 2017-10-27 NOTE — ED Provider Notes (Signed)
[facility]    CSN: [id number] Arrival date & time: 10/27/17  1005     History   Chief Complaint Chief Complaint  Patient presents with  . Headache  . Fatigue    HPI Jennifer Frank is a 67 y.o. female.   HPI  Patient is here to be evaluated for fatigue, headache, mild postnasal drip, clear rhinorrhea, ear pressure, and scratchy throat.  Some coughing but she feels like it is to clear her throat.  No purulent sputum.  No fever chills.  No shortness of breath.  No chest pain with coughing.  No known exposure to illness.  She has not used any over-the-counter medicines.  Symptoms been present for 3 or 4 days.  No known underlying allergies.  Past Medical History:  Diagnosis Date  . Fundic gland polyps of stomach, benign   . GERD (gastroesophageal reflux disease)   . IBS (irritable bowel syndrome)   . Personal history of colonic polyps - adenoma 09/22/2013  . PONV (postoperative nausea and vomiting)   . Reflux     Patient Active Problem List   Diagnosis Date Noted  . Primary localized osteoarthritis of left knee 05/27/2016  . Primary osteoarthritis of left knee 05/26/2016  . Pre-operative cardiovascular examination 04/22/2016  . Hearing loss 04/22/2014  . Varicose veins of lower extremities with other complications 14/48/1856  . Routine health maintenance 03/01/2012  . Hyperlipidemia 02/16/2007  . GERD 02/16/2007    Past Surgical History:  Procedure Laterality Date  . COLONOSCOPY    . ESOPHAGOGASTRODUODENOSCOPY    . HIP ARTHROSCOPY Left 1997  . JOINT REPLACEMENT Bilateral [id number]   hip  . KNEE ARTHROPLASTY Left 12/2015  . KNEE ARTHROSCOPY Left 1996  . TONSILLECTOMY AND ADENOIDECTOMY  1957  . TOTAL KNEE ARTHROPLASTY Bilateral 05/27/2016   Procedure: LEFT TOTAL KNEE ARTHROPLASTY WITH RIGHT KNEE ARTHROSCOPY;  Surgeon: Frederik Pear, MD;  Location: [facility];  Service: Orthopedics;  Laterality: Bilateral;    OB History   None      Home Medications     Prior to Admission medications   Medication Sig Start Date End Date Taking? Authorizing Provider  ondansetron (ZOFRAN ODT) 4 MG disintegrating tablet Take 1 tablet (4 mg total) by mouth every 8 (eight) hours as needed for nausea or vomiting. 06/23/17   Marrian Salvage, FNP    Family History Family History  Problem Relation Age of Onset  . Deep vein thrombosis Mother   . Heart disease Mother        before age 64  . Hyperlipidemia Mother   . Hypertension Mother   . Other Mother        varicose veins  . Heart attack Mother   . Peripheral vascular disease Mother   . Bleeding Disorder Mother   . Deep vein thrombosis Father   . Heart disease Father        before age 26  . Hyperlipidemia Father   . Hypertension Father   . Heart attack Father   . Bleeding Disorder Father   . AAA (abdominal aortic aneurysm) Father   . Cancer Brother   . Hyperlipidemia Brother     Social History Social History   Tobacco Use  . Smoking status: Never Smoker  . Smokeless tobacco: Never Used  Substance Use Topics  . Alcohol use: Yes    Alcohol/week: 0.6 oz    Types: 1 Glasses of wine per week    Comment: rarely  . Drug use: No  Allergies   Acetaminophen-codeine; Darvocet [propoxyphene n-acetaminophen]; Morphine and related; and Vioxx [rofecoxib]   Review of Systems Review of Systems  Constitutional: Positive for fatigue. Negative for chills and fever.  HENT: Positive for congestion, postnasal drip, rhinorrhea, sinus pressure and sore throat. Negative for ear pain.   Eyes: Negative for pain and visual disturbance.  Respiratory: Positive for cough. Negative for shortness of breath.   Cardiovascular: Negative for chest pain and palpitations.  Gastrointestinal: Negative for abdominal pain, nausea and vomiting.  Genitourinary: Negative for dysuria and hematuria.  Musculoskeletal: Negative for arthralgias and back pain.  Skin: Negative for color change and rash.  Neurological:  Negative for seizures and syncope.  All other systems reviewed and are negative.    Physical Exam Triage Vital Signs ED Triage Vitals [10/27/17 1027]  Enc Vitals Group     BP 139/73     Pulse Rate 85     Resp 16     Temp 98.5 F (36.9 C)     Temp Source Oral     SpO2 98 %     Weight      Height      Head Circumference      Peak Flow      Pain Score      Pain Loc      Pain Edu?      Excl. in [place]?    No data found.  Updated Vital Signs BP 139/73 (BP Location: Right Arm)   Pulse 85   Temp 98.5 F (36.9 C) (Oral)   Resp 16   SpO2 98%   Visual Acuity Right Eye Distance:   Left Eye Distance:   Bilateral Distance:    Right Eye Near:   Left Eye Near:    Bilateral Near:     Physical Exam  Constitutional: She appears well-developed and well-nourished. She does not appear ill. No distress.  HENT:  Head: Normocephalic and atraumatic.  Right Ear: Hearing, tympanic membrane and ear canal normal.  Left Ear: Hearing, tympanic membrane and ear canal normal.  Nose: Mucosal edema present. Right sinus exhibits no maxillary sinus tenderness and no frontal sinus tenderness. Left sinus exhibits no maxillary sinus tenderness and no frontal sinus tenderness.  Mouth/Throat: Uvula is midline, oropharynx is clear and moist and mucous membranes are normal. Normal dentition.  Eyes: Pupils are equal, round, and reactive to light. Conjunctivae and EOM are normal.  Neck: Normal range of motion.  Cardiovascular: Normal rate.  Pulmonary/Chest: Effort normal. No respiratory distress.  Abdominal: Soft. She exhibits no distension.  Musculoskeletal: Normal range of motion. She exhibits no edema.  Lymphadenopathy:    She has no cervical adenopathy.  Neurological: She is alert.  Skin: Skin is warm and dry.     UC Treatments / Results  Labs (all labs ordered are listed, but only abnormal results are displayed) Labs Reviewed - No data to display  EKG None  Radiology No results  found.  Procedures Procedures (including critical care time)  Medications Ordered in UC Medications - No data to display  Initial Impression / Assessment and Plan / UC Course  I have reviewed the triage vital signs and the nursing notes.  Pertinent labs & imaging results that were available during my care of the patient were reviewed by me and considered in my medical decision making (see chart for details).     Discussed viral URI.  Reasons not to take an antibiotic.  Symptomatic care.  Expected results. Final Clinical Impressions(s) /  UC Diagnoses   Final diagnoses:  Viral URI     Discharge Instructions     Use flonase for the nasal drainage Tylenol or ibuprofen for pain Push fluids Rest Return if not better in a few days    ED Prescriptions    None     Controlled Substance Prescriptions [place] Controlled Substance Registry consulted? Not Applicable   Raylene Everts, MD 10/27/17 2037

## 2017-10-27 NOTE — Discharge Instructions (Addendum)
Use flonase for the nasal drainage Tylenol or ibuprofen for pain Push fluids Rest Return if not better in a few days

## 2017-10-29 ENCOUNTER — Telehealth: Payer: Self-pay | Admitting: Internal Medicine

## 2017-10-29 NOTE — Telephone Encounter (Signed)
Okay with me.  Please schedule next CPE here.  Thanks.

## 2017-10-29 NOTE — Telephone Encounter (Signed)
Copied from [facility] [id number]. Topic: Appointment Scheduling - Prior Auth Required for Appointment >> Oct 29, 2017 11:05 AM Nathanial Millman J wrote: No appointment has been scheduled. Patient is requesting transfer from Dr Sharlet Salina to Dr Damita Dunnings. Pt brother, Mahlon Gammon III, are current pts of Dr Damita Dunnings appointment. Per scheduling protocol, this appointment requires a prior authorization prior to scheduling. Cb is [phone]   Route to department's PEC pool.

## 2017-10-29 NOTE — Telephone Encounter (Signed)
Jennifer Frank, could you schedule this for me?

## 2017-10-29 NOTE — Telephone Encounter (Signed)
Please advise 

## 2017-10-29 NOTE — Telephone Encounter (Signed)
Fine

## 2017-11-10 ENCOUNTER — Encounter: Payer: Medicare Other | Admitting: Internal Medicine

## 2017-12-08 ENCOUNTER — Other Ambulatory Visit: Payer: Self-pay | Admitting: Family Medicine

## 2017-12-08 DIAGNOSIS — E785 Hyperlipidemia, unspecified: Secondary | ICD-10-CM

## 2017-12-09 ENCOUNTER — Other Ambulatory Visit: Payer: Medicare Other

## 2017-12-09 ENCOUNTER — Ambulatory Visit (INDEPENDENT_AMBULATORY_CARE_PROVIDER_SITE_OTHER): Payer: Medicare Other

## 2017-12-09 VITALS — BP 114/72 | HR 71 | Temp 98.4°F | Ht 68.75 in | Wt 191.8 lb

## 2017-12-09 DIAGNOSIS — Z0000 Encounter for general adult medical examination without abnormal findings: Secondary | ICD-10-CM

## 2017-12-09 DIAGNOSIS — E785 Hyperlipidemia, unspecified: Secondary | ICD-10-CM

## 2017-12-09 LAB — BASIC METABOLIC PANEL
BUN: 16 mg/dL (ref 6–23)
CO2: 27 mEq/L (ref 19–32)
Calcium: 9.5 mg/dL (ref 8.4–10.5)
Chloride: 105 mEq/L (ref 96–112)
Creatinine, Ser: 1.05 mg/dL (ref 0.40–1.20)
GFR: 55.56 mL/min — ABNORMAL LOW (ref >60.00)
GLUCOSE: 97 mg/dL (ref 70–99)
POTASSIUM: 4.5 meq/L (ref 3.5–5.1)
Sodium: 138 mEq/L (ref 135–145)

## 2017-12-09 LAB — LIPID PANEL
CHOL/HDL RATIO: 4
CHOLESTEROL: 225 mg/dL — AB (ref 0–200)
HDL: 55.2 mg/dL (ref >39.00)
LDL Cholesterol: 144 mg/dL — ABNORMAL HIGH (ref 0–99)
NonHDL: 169.49
TRIGLYCERIDES: 127 mg/dL (ref 0.0–149.0)
VLDL: 25.4 mg/dL (ref 0.0–40.0)

## 2017-12-09 NOTE — Progress Notes (Signed)
I reviewed health advisor's note, was available for consultation on the day of service listed in this note, and agree with documentation and plan. Baldomero Mirarchi, MD.   

## 2017-12-09 NOTE — Patient Instructions (Signed)
Jennifer Frank , Thank you for taking time to come for your Medicare Wellness Visit. I appreciate your ongoing commitment to your health goals. Please review the following plan we discussed and let me know if I can assist you in the future.   These are the goals we discussed: Goals    . DIET - INCREASE WATER INTAKE     Starting 12/09/2017, I will attempt to drink at least 6-8 glasses of water daily.        This is a list of the screening recommended for you and due dates:  Health Maintenance  Topic Date Due  . Pneumonia vaccines (1 of 2 - PCV13) 12/09/2048*  . Flu Shot  12/18/2017  . Mammogram  03/03/2018  . Colon Cancer Screening  09/23/2018  . Tetanus Vaccine  03/17/2024  . DEXA scan (bone density measurement)  Completed  .  Hepatitis C: One time screening is recommended by Center for Disease Control  (CDC) for  adults born from [birth year] through 1965.   Completed  *Topic was postponed. The date shown is not the original due date.   Preventive Care for Adults  A healthy lifestyle and preventive care can promote health and wellness. Preventive health guidelines for adults include the following key practices.  . A routine yearly physical is a good way to check with your health care provider about your health and preventive screening. It is a chance to share any concerns and updates on your health and to receive a thorough exam.  . Visit your dentist for a routine exam and preventive care every 6 months. Brush your teeth twice a day and floss once a day. Good oral hygiene prevents tooth decay and gum disease.  . The frequency of eye exams is based on your age, health, family medical history, use  of contact lenses, and other factors. Follow your health care provider's recommendations for frequency of eye exams.  . Eat a healthy diet. Foods like vegetables, fruits, whole grains, low-fat dairy products, and lean protein foods contain the nutrients you need without too many calories. Decrease  your intake of foods high in solid fats, added sugars, and salt. Eat the right amount of calories for you. Get information about a proper diet from your health care provider, if necessary.  . Regular physical exercise is one of the most important things you can do for your health. Most adults should get at least 150 minutes of moderate-intensity exercise (any activity that increases your heart rate and causes you to sweat) each week. In addition, most adults need muscle-strengthening exercises on 2 or more days a week.  Silver Sneakers may be a benefit available to you. To determine eligibility, you may visit the website: www.silversneakers.com or contact program at 518-348-4456 Mon-Fri between 8AM-8PM.   . Maintain a healthy weight. The body mass index (BMI) is a screening tool to identify possible weight problems. It provides an estimate of body fat based on height and weight. Your health care provider can find your BMI and can help you achieve or maintain a healthy weight.   For adults 20 years and older: ? A BMI below 18.5 is considered underweight. ? A BMI of 18.5 to 24.9 is normal. ? A BMI of 25 to 29.9 is considered overweight. ? A BMI of 30 and above is considered obese.   . Maintain normal blood lipids and cholesterol levels by exercising and minimizing your intake of saturated fat. Eat a balanced diet with plenty of fruit  and vegetables. Blood tests for lipids and cholesterol should begin at age 33 and be repeated every 5 years. If your lipid or cholesterol levels are high, you are over 50, or you are at high risk for heart disease, you may need your cholesterol levels checked more frequently. Ongoing high lipid and cholesterol levels should be treated with medicines if diet and exercise are not working.  . If you smoke, find out from your health care provider how to quit. If you do not use tobacco, please do not start.  . If you choose to drink alcohol, please do not consume more than  2 drinks per day. One drink is considered to be 12 ounces (355 mL) of beer, 5 ounces (148 mL) of wine, or 1.5 ounces (44 mL) of liquor.  . If you are 36-19 years old, ask your health care provider if you should take aspirin to prevent strokes.  . Use sunscreen. Apply sunscreen liberally and repeatedly throughout the day. You should seek shade when your shadow is shorter than you. Protect yourself by wearing long sleeves, pants, a wide-brimmed hat, and sunglasses year round, whenever you are outdoors.  . Once a month, do a whole body skin exam, using a mirror to look at the skin on your back. Tell your health care provider of new moles, moles that have irregular borders, moles that are larger than a pencil eraser, or moles that have changed in shape or color.

## 2017-12-09 NOTE — Progress Notes (Signed)
Subjective:   Jennifer Frank is a 67 y.o. female who presents for an Initial Medicare Annual Wellness Visit.  Review of Systems    N/A  Cardiac Risk Factors include: advanced age (>71men, >28 women);dyslipidemia     Objective:    Today's Vitals   12/09/17 1119 12/09/17 1353  BP: 114/72   Pulse: 71   Temp: 98.4 F (36.9 C)   TempSrc: Oral   SpO2: 98%   Weight: 191 lb 12 oz (87 kg)   Height: 5' 8.75" (1.746 m)   PainSc: 2  2   PainLoc: Knee    Body mass index is 28.52 kg/m.  Advanced Directives 12/09/2017 05/16/2016 01/28/2016 06/06/2015  Does Patient Have a Medical Advance Directive? Yes Yes Yes No  Type of Paramedic of [place];Living will [place];Living will [place];Living will -  Does patient want to make changes to medical advance directive? - No - Patient declined No - Patient declined -  Copy of [place] in Chart? No - copy requested No - copy requested No - copy requested -  Would patient like information on creating a medical advance directive? - - - No - patient declined information    Current Medications (verified) Outpatient Encounter Medications as of 12/09/2017  Medication Sig  . [DISCONTINUED] ondansetron (ZOFRAN ODT) 4 MG disintegrating tablet Take 1 tablet (4 mg total) by mouth every 8 (eight) hours as needed for nausea or vomiting.   No facility-administered encounter medications on file as of 12/09/2017.     Allergies (verified) Acetaminophen-codeine; Darvocet [propoxyphene n-acetaminophen]; Morphine and related; and Vioxx [rofecoxib]   History: Past Medical History:  Diagnosis Date  . Fundic gland polyps of stomach, benign   . GERD (gastroesophageal reflux disease)   . IBS (irritable bowel syndrome)   . Personal history of colonic polyps - adenoma 09/22/2013  . PONV (postoperative nausea and vomiting)   . Reflux    Past Surgical History:  Procedure  Laterality Date  . COLONOSCOPY    . ESOPHAGOGASTRODUODENOSCOPY    . HIP ARTHROSCOPY Left 1997  . JOINT REPLACEMENT Bilateral [id number]   hip  . KNEE ARTHROPLASTY Left 12/2015  . KNEE ARTHROSCOPY Left 1996  . TONSILLECTOMY AND ADENOIDECTOMY  1957  . TOTAL KNEE ARTHROPLASTY Bilateral 05/27/2016   Procedure: LEFT TOTAL KNEE ARTHROPLASTY WITH RIGHT KNEE ARTHROSCOPY;  Surgeon: Frederik Pear, MD;  Location: [facility];  Service: Orthopedics;  Laterality: Bilateral;   Family History  Problem Relation Age of Onset  . Deep vein thrombosis Mother   . Heart disease Mother        before age 72  . Hyperlipidemia Mother   . Hypertension Mother   . Other Mother        varicose veins  . Heart attack Mother   . Peripheral vascular disease Mother   . Bleeding Disorder Mother   . Deep vein thrombosis Father   . Heart disease Father        before age 23  . Hyperlipidemia Father   . Hypertension Father   . Heart attack Father   . Bleeding Disorder Father   . AAA (abdominal aortic aneurysm) Father   . Cancer Brother   . Hyperlipidemia Brother    Social History   Socioeconomic History  . Marital status: Married    Spouse name: Not on file  . Number of children: 2  . Years of education: Not on file  . Highest education  level: Not on file  Occupational History  . Occupation: Retired    Fish farm manager: RETIRED  Social Needs  . Financial resource strain: Not on file  . Food insecurity:    Worry: Not on file    Inability: Not on file  . Transportation needs:    Medical: Not on file    Non-medical: Not on file  Tobacco Use  . Smoking status: Never Smoker  . Smokeless tobacco: Never Used  Substance and Sexual Activity  . Alcohol use: Yes    Alcohol/week: 0.6 oz    Types: 1 Glasses of wine per week    Comment: rarely  . Drug use: No  . Sexual activity: Not on file  Lifestyle  . Physical activity:    Days per week: Not on file    Minutes per session: Not on file  . Stress: Not on file    Relationships  . Social connections:    Talks on phone: Not on file    Gets together: Not on file    Attends religious service: Not on file    Active member of club or organization: Not on file    Attends meetings of clubs or organizations: Not on file    Relationship status: Not on file  Other Topics Concern  . Not on file  Social History Narrative  . Not on file    Tobacco Counseling Counseling given: No   Clinical Intake:  Pre-visit preparation completed: Yes  Pain Score: 2      Nutritional Status: BMI 25 -29 Overweight Nutritional Risks: None Diabetes: No  How often do you need to have someone help you when you read instructions, pamphlets, or other written materials from your doctor or pharmacy?: 1 - Never What is the last grade level you completed in school?: 12th grade + 2 yrs college  Interpreter Needed?: No  Comments: pt lives with spouse Information entered by :: LPinson, LPN   Activities of Daily Living In your present state of health, do you have any difficulty performing the following activities: 12/09/2017  Hearing? Y  Vision? N  Difficulty concentrating or making decisions? N  Walking or climbing stairs? Y  Dressing or bathing? N  Doing errands, shopping? N  Preparing Food and eating ? N  Using the Toilet? N  In the past six months, have you accidently leaked urine? Y  Do you have problems with loss of bowel control? N  Managing your Medications? N  Managing your Finances? N  Housekeeping or managing your Housekeeping? N  Some recent data might be hidden     Immunizations and Health Maintenance Immunization History  Administered Date(s) Administered  . Influenza Whole 02/21/2010, 02/21/2014  . PPD Test 05/30/2016  . Td 01/16/2009  . Tdap 03/17/2014   There are no preventive care reminders to display for this patient.  Patient Care Team: Tonia Ghent, MD as PCP - General (Family Medicine)  Indicate any recent Medical Services  you may have received from other than [facility] providers in the past year (date may be approximate).     Assessment:   This is a routine wellness examination for Jennifer Frank.  Hearing/Vision screen  Hearing Screening   125Hz  250Hz  500Hz  1000Hz  2000Hz  3000Hz  4000Hz  6000Hz  8000Hz   Right ear:   40 40 0  40    Left ear:   40 40 0  40    Vision Screening Comments: Vision exam in July 2019 @ [facility]  Dietary issues and exercise activities  discussed: Current Exercise Habits: The patient does not participate in regular exercise at present, Exercise limited by: None identified  Goals    . DIET - INCREASE WATER INTAKE     Starting 12/09/2017, I will attempt to drink at least 6-8 glasses of water daily.       Depression Screen PHQ 2/9 Scores 12/09/2017 06/23/2017 04/22/2016  PHQ - 2 Score 0 0 0  PHQ- 9 Score 0 - -    Fall Risk Fall Risk  12/09/2017 06/23/2017 04/22/2016  Falls in the past year? No No Yes  Number falls in past yr: - - 2 or more  Injury with Fall? - - No   Cognitive Function: MMSE - Mini Mental State Exam 12/09/2017  Orientation to time 5  Orientation to Place 5  Registration 3  Attention/ Calculation 0  Recall 0  Recall-comments unable to recall 3 of 3 words  Language- name 2 objects 0  Language- repeat 1  Language- follow 3 step command 3  Language- read & follow direction 0  Write a sentence 0  Copy design 0  Total score 17     PLEASE NOTE: A Mini-Cog screen was completed. Maximum score is 20. A value of 0 denotes this part of [place] MMSE was not completed or the patient failed this part of the Mini-Cog screening.   Mini-Cog Screening Orientation to Time - Max 5 pts Orientation to Place - Max 5 pts Registration - Max 3 pts Recall - Max 3 pts Language Repeat - Max 1 pts Language Follow 3 Step Command - Max 3 pts     Screening Tests Health Maintenance  Topic Date Due  . PNA vac Low Risk Adult (1 of 2 - PCV13) 12/09/2048 (Originally 12/11/2015)  . INFLUENZA  VACCINE  12/18/2017  . MAMMOGRAM  03/03/2018  . COLONOSCOPY  09/23/2018  . TETANUS/TDAP  03/17/2024  . DEXA SCAN  Completed  . Hepatitis C Screening  Completed     Plan:     I have personally reviewed, addressed, and noted the following in the patient's chart:  A. Medical and social history B. Use of alcohol, tobacco or illicit drugs  C. Current medications and supplements D. Functional ability and status E.  Nutritional status F.  Physical activity G. Advance directives H. List of other physicians I.  Hospitalizations, surgeries, and ER visits in previous 12 months J.  [place] to include hearing, vision, cognitive, depression L. Referrals and appointments - none  In addition, I have reviewed and discussed with patient certain preventive protocols, quality metrics, and best practice recommendations. A written personalized care plan for preventive services as well as general preventive health recommendations were provided to patient.  See attached scanned questionnaire for additional information.   Signed,   Lindell Noe, MHA, BS, LPN Health Coach

## 2017-12-09 NOTE — Progress Notes (Signed)
PCP notes:   Health maintenance:  PNA vaccines - pt declined  Abnormal screenings:   Mini-Cog score: 17/20 MMSE - Mini Mental State Exam 12/09/2017  Orientation to time 5  Orientation to Place 5  Registration 3  Attention/ Calculation 0  Recall 0  Recall-comments unable to recall 3 of 3 words  Language- name 2 objects 0  Language- repeat 1  Language- follow 3 step command 3  Language- read & follow direction 0  Write a sentence 0  Copy design 0  Total score 17   Hearing - failed  Hearing Screening   125Hz  250Hz  500Hz  1000Hz  2000Hz  3000Hz  4000Hz  6000Hz  8000Hz   Right ear:   40 40 0  40    Left ear:   40 40 0  40     Patient concerns:   Patient wants to discuss urinary incontinence.   Nurse concerns:  None  Next PCP appt:   12/12/17 @ 1515

## 2017-12-12 ENCOUNTER — Ambulatory Visit: Payer: Medicare Other | Admitting: Family Medicine

## 2017-12-12 ENCOUNTER — Encounter: Payer: Self-pay | Admitting: Family Medicine

## 2017-12-12 VITALS — BP 114/72 | HR 71 | Temp 98.4°F | Ht 68.75 in | Wt 191.8 lb

## 2017-12-12 DIAGNOSIS — R32 Unspecified urinary incontinence: Secondary | ICD-10-CM

## 2017-12-12 DIAGNOSIS — Z7189 Other specified counseling: Secondary | ICD-10-CM

## 2017-12-12 DIAGNOSIS — Z0000 Encounter for general adult medical examination without abnormal findings: Secondary | ICD-10-CM

## 2017-12-12 NOTE — Progress Notes (Signed)
Tetanus 2015 Flu encouraged.   PNA encouraged.   Shingles out of stock.   Colonoscopy 2015 Pap per gyn- she'll f/u with Dr. Gertie Fey.  I'll defer.  D/w pt.  She agrees.  S/p abnormal pap in the past with prev cryotherapy, many years ago.   She reports f/u normal paps in the interval.   Mammogram pending for next week per gyn.   DXA prev done per gyn.  Husband designated if patient were incapacitated.  Diet and exercise d/w pt, encouraged both, as much as possible.  She is relatively deconditioned. She isn't having CP.  She has some fatigue in the last 6 months, episodically.    Recall rechecked today.  She doesn't have red flag events but has occ lapses.  2/3 recall, 3/3 with prompting today.    Urinary incontinence d/w pt.  She has trouble with coughing/laughing/sneezing, some leaking even w/o that.  No blood in urine.  She has loss of control of stream, she can't stop the stream when it starts. She had tried Kegel exercises w/o relief.  Nocturia at baseline even w/o drinking fluid prior to bed.  Wearing pads day and night.    She has diarrhea that may be triggered by some occ foods, d/w pt about keeping a diary re: triggers.    PMH and SH reviewed ROS: Per HPI unless specifically indicated in ROS section   Meds, vitals, and allergies reviewed.   GEN: nad, alert and oriented HEENT: mucous membranes moist NECK: supple w/o LA CV: rrr PULM: ctab, no inc wob ABD: soft, +bs EXT: no edema SKIN: no acute rash

## 2017-12-12 NOTE — Patient Instructions (Addendum)
We will call about your referral.  Rosaria Ferries or Azalee Course will call you if you don't see one of them on the way out.  Work on getting a little more exercise.   Take care.  Glad to see you.  I would get a flu shot each fall.   I would get the pneumonia shot.

## 2017-12-14 DIAGNOSIS — Z7189 Other specified counseling: Secondary | ICD-10-CM | POA: Insufficient documentation

## 2017-12-14 DIAGNOSIS — R32 Unspecified urinary incontinence: Secondary | ICD-10-CM | POA: Insufficient documentation

## 2017-12-14 NOTE — Assessment & Plan Note (Signed)
D/w pt about options.  Detailed conversation.  She is failed Kegel exercises in the past.  Refer to urology.  All questions answered to the best of my ability.  She agrees. >25 minutes spent in face to face time with patient, >50% spent in counselling or coordination of care.

## 2017-12-14 NOTE — Assessment & Plan Note (Signed)
Tetanus 2015 Flu encouraged.   PNA encouraged.   Shingles out of stock.   Colonoscopy 2015 Pap per gyn- she'll f/u with Dr. Gertie Fey.  I'll defer.  D/w pt.  She agrees.  S/p abnormal pap in the past with prev cryotherapy, many years ago.   She reports f/u normal paps in the interval.   Mammogram pending for next week per gyn.   DXA prev done per gyn.  Husband designated if patient were incapacitated.  Diet and exercise d/w pt, encouraged both, as much as possible.  She is relatively deconditioned. She isn't having CP.  She has some fatigue in the last 6 months, episodically.

## 2017-12-14 NOTE — Assessment & Plan Note (Signed)
Husband designated if patient were incapacitated. 

## 2017-12-16 DIAGNOSIS — Z1231 Encounter for screening mammogram for malignant neoplasm of breast: Secondary | ICD-10-CM | POA: Diagnosis not present

## 2017-12-16 DIAGNOSIS — Z6829 Body mass index (BMI) 29.0-29.9, adult: Secondary | ICD-10-CM | POA: Diagnosis not present

## 2017-12-16 DIAGNOSIS — Z01419 Encounter for gynecological examination (general) (routine) without abnormal findings: Secondary | ICD-10-CM | POA: Diagnosis not present

## 2017-12-18 LAB — HM MAMMOGRAPHY

## 2017-12-25 ENCOUNTER — Encounter: Payer: Self-pay | Admitting: Family Medicine

## 2018-01-07 DIAGNOSIS — N952 Postmenopausal atrophic vaginitis: Secondary | ICD-10-CM | POA: Diagnosis not present

## 2018-01-07 DIAGNOSIS — N3941 Urge incontinence: Secondary | ICD-10-CM | POA: Diagnosis not present

## 2018-02-16 DIAGNOSIS — R32 Unspecified urinary incontinence: Secondary | ICD-10-CM | POA: Diagnosis not present

## 2018-05-14 ENCOUNTER — Telehealth: Payer: Self-pay | Admitting: Family Medicine

## 2018-05-14 MED ORDER — OSELTAMIVIR PHOSPHATE 75 MG PO CAPS
75.0000 mg | ORAL_CAPSULE | Freq: Every day | ORAL | 0 refills | Status: DC
Start: 2018-05-14 — End: 2019-01-04

## 2018-05-14 NOTE — Telephone Encounter (Signed)
Pt's husband diagnosed with influenza B today.  She requests preventative tamiflu which was sent to pharmacy.

## 2018-09-25 ENCOUNTER — Encounter: Payer: Self-pay | Admitting: Internal Medicine

## 2018-10-07 ENCOUNTER — Encounter: Payer: Self-pay | Admitting: Internal Medicine

## 2018-12-28 ENCOUNTER — Other Ambulatory Visit: Payer: Self-pay

## 2018-12-28 ENCOUNTER — Other Ambulatory Visit: Payer: Self-pay | Admitting: Family Medicine

## 2018-12-28 ENCOUNTER — Ambulatory Visit: Payer: Medicare Other

## 2018-12-28 ENCOUNTER — Other Ambulatory Visit (INDEPENDENT_AMBULATORY_CARE_PROVIDER_SITE_OTHER): Payer: Medicare Other

## 2018-12-28 DIAGNOSIS — E785 Hyperlipidemia, unspecified: Secondary | ICD-10-CM

## 2018-12-28 LAB — COMPREHENSIVE METABOLIC PANEL
ALT: 9 U/L (ref 0–35)
AST: 12 U/L (ref 0–37)
Albumin: 3.9 g/dL (ref 3.5–5.2)
Alkaline Phosphatase: 70 U/L (ref 39–117)
BUN: 17 mg/dL (ref 6–23)
CO2: 27 mEq/L (ref 19–32)
Calcium: 9.3 mg/dL (ref 8.4–10.5)
Chloride: 106 mEq/L (ref 96–112)
Creatinine, Ser: 0.87 mg/dL (ref 0.40–1.20)
GFR: 64.74 mL/min (ref >60.00)
Glucose, Bld: 99 mg/dL (ref 70–99)
Potassium: 5 mEq/L (ref 3.5–5.1)
Sodium: 139 mEq/L (ref 135–145)
Total Bilirubin: 0.4 mg/dL (ref 0.2–1.2)
Total Protein: 6.6 g/dL (ref 6.0–8.3)

## 2018-12-28 LAB — LIPID PANEL
Cholesterol: 178 mg/dL (ref 0–200)
HDL: 46.6 mg/dL (ref >39.00)
LDL Cholesterol: 115 mg/dL — ABNORMAL HIGH (ref 0–99)
NonHDL: 131.28
Total CHOL/HDL Ratio: 4
Triglycerides: 80 mg/dL (ref 0.0–149.0)
VLDL: 16 mg/dL (ref 0.0–40.0)

## 2019-01-04 ENCOUNTER — Other Ambulatory Visit: Payer: Self-pay

## 2019-01-04 ENCOUNTER — Encounter: Payer: Self-pay | Admitting: Family Medicine

## 2019-01-04 ENCOUNTER — Ambulatory Visit (INDEPENDENT_AMBULATORY_CARE_PROVIDER_SITE_OTHER): Payer: Medicare Other | Admitting: Family Medicine

## 2019-01-04 VITALS — BP 102/66 | HR 74 | Temp 97.5°F | Ht 68.75 in | Wt 180.2 lb

## 2019-01-04 DIAGNOSIS — R32 Unspecified urinary incontinence: Secondary | ICD-10-CM

## 2019-01-04 DIAGNOSIS — K219 Gastro-esophageal reflux disease without esophagitis: Secondary | ICD-10-CM

## 2019-01-04 DIAGNOSIS — Z0000 Encounter for general adult medical examination without abnormal findings: Secondary | ICD-10-CM

## 2019-01-04 DIAGNOSIS — H9190 Unspecified hearing loss, unspecified ear: Secondary | ICD-10-CM

## 2019-01-04 DIAGNOSIS — Z7189 Other specified counseling: Secondary | ICD-10-CM

## 2019-01-04 MED ORDER — OXYBUTYNIN CHLORIDE ER 5 MG PO TB24: 5.0000 mg | ORAL_TABLET | Freq: Every day | ORAL | 1 refills | Status: DC

## 2019-01-04 NOTE — Patient Instructions (Addendum)
We'll call about ENT appointment.   Please call about GI  And GYN follow up.  Try ditropan.  Update me as needed.  Take care.  Glad to see you.

## 2019-01-04 NOTE — Progress Notes (Signed)
I have personally reviewed the Medicare Annual Wellness questionnaire and have noted 1. The patient's medical and social history 2. Their use of alcohol, tobacco or illicit drugs 3. Their current medications and supplements 4. The patient's functional ability including ADL's, fall risks, home safety risks and hearing or visual             impairment. 5. Diet and physical activities 6. Evidence for depression or mood disorders  The patients weight, height, BMI have been recorded in the chart and visual acuity is per eye clinic.  I have made referrals, counseling and provided education to the patient based review of the above and I have provided the pt with a written personalized care plan for preventive services.  Provider list updated- see scanned forms.  Routine anticipatory guidance given to patient.  See health maintenance. The possibility exists that previously documented standard health maintenance information may have been brought forward from a previous encounter into this note.  If needed, that same information has been updated to reflect the current situation based on today's encounter.    Flu d/w pt Shingles discussed with patient PNA discussed with patient Tetanus 2015 Colon cancer screening 2015.  Discussed with patient regarding follow-up. Breast cancer screening per gynecology Bone density testing per gynecology. Advance directive-husband designated if patient were incapacitated Cognitive function addressed- see scanned forms- and if abnormal then additional documentation follows.   Hearing loss noted.  Discussed ENT referral.  Ordered.  She is working on diet and exercise.  Weight loss helped with bladder sx and GERD sx.  Myrbetriq helped but was really expensive.  She has some leaking but is trying to manage as best she can.  She had to put off urology f/u.  We talked about a trial of Ditropan.  See after visit summary.  PMH and SH reviewed  Meds, vitals, and allergies  reviewed.   ROS: Per HPI.  Unless specifically indicated otherwise in HPI, the patient denies:  General: fever. Eyes: acute vision changes ENT: sore throat Cardiovascular: chest pain Respiratory: SOB GI: vomiting GU: dysuria Musculoskeletal: acute back pain Derm: acute rash Neuro: acute motor dysfunction Psych: worsening mood Endocrine: polydipsia Heme: bleeding Allergy: hayfever  GEN: nad, alert and oriented HEENT: ncat NECK: supple w/o LA CV: rrr. PULM: ctab, no inc wob ABD: soft, +bs EXT: no edema SKIN: no acute rash  Health Maintenance  Topic Date Due  . COLONOSCOPY  09/23/2018  . INFLUENZA VACCINE  12/19/2018  . PNA vac Low Risk Adult (1 of 2 - PCV13) 12/09/2048 (Originally 12/11/2015)  . MAMMOGRAM  12/19/2019  . TETANUS/TDAP  03/17/2024  . DEXA SCAN  Completed  . Hepatitis C Screening  Completed

## 2019-01-10 DIAGNOSIS — Z0000 Encounter for general adult medical examination without abnormal findings: Secondary | ICD-10-CM | POA: Insufficient documentation

## 2019-01-10 NOTE — Assessment & Plan Note (Signed)
Improved with weight loss.  Continue as is.

## 2019-01-10 NOTE — Assessment & Plan Note (Signed)
Flu d/w pt Shingles discussed with patient PNA discussed with patient Tetanus 2015 Colon cancer screening 2015.  Discussed with patient regarding follow-up. Breast cancer screening per gynecology Bone density testing per gynecology. Advance directive-husband designated if patient were incapacitated Cognitive function addressed- see scanned forms- and if abnormal then additional documentation follows.

## 2019-01-10 NOTE — Assessment & Plan Note (Signed)
Refer to ENT

## 2019-01-10 NOTE — Assessment & Plan Note (Signed)
Improved some with weight loss.  Discussed routine cautions with Ditropan.  She can try that the meantime and update me as needed.  She agrees.

## 2019-01-10 NOTE — Assessment & Plan Note (Signed)
Husband designated if patient were incapacitated. 

## 2019-01-26 ENCOUNTER — Encounter: Payer: Self-pay | Admitting: Family Medicine

## 2019-01-26 DIAGNOSIS — H903 Sensorineural hearing loss, bilateral: Secondary | ICD-10-CM | POA: Diagnosis not present

## 2019-03-09 ENCOUNTER — Encounter: Payer: Self-pay | Admitting: Internal Medicine

## 2019-03-16 ENCOUNTER — Encounter (INDEPENDENT_AMBULATORY_CARE_PROVIDER_SITE_OTHER): Payer: Self-pay

## 2019-03-24 ENCOUNTER — Ambulatory Visit (AMBULATORY_SURGERY_CENTER): Payer: Medicare Other | Admitting: *Deleted

## 2019-03-24 ENCOUNTER — Other Ambulatory Visit: Payer: Self-pay

## 2019-03-24 VITALS — Temp 97.3°F | Ht 68.75 in | Wt 176.0 lb

## 2019-03-24 DIAGNOSIS — Z86010 Personal history of colonic polyps: Secondary | ICD-10-CM

## 2019-03-24 DIAGNOSIS — Z800 Family history of malignant neoplasm of digestive organs: Secondary | ICD-10-CM

## 2019-03-24 DIAGNOSIS — Z1159 Encounter for screening for other viral diseases: Secondary | ICD-10-CM

## 2019-03-24 NOTE — Progress Notes (Signed)
Husband in PV today- temp 97.1   No egg or soy allergy known to patient   issues with past sedation with any surgeries  or procedures of PONV- , no intubation problems  No diet pills per patient No home 02 use per patient  No blood thinners per patient  Pt denies issues with constipation  No A fib or A flutter  EMMI video sent to pt's e mail   Due to the COVID-19 pandemic we are asking patients to follow these guidelines. Please only bring one care partner. Please be aware that your care partner may wait in the car in the parking lot or if they feel like they will be too hot to wait in the car, they may wait in the lobby on the 4th floor. All care partners are required to wear a mask the entire time (we do not have any that we can provide them), they need to practice social distancing, and we will do a Covid check for all patient's and care partners when you arrive. Also we will check their temperature and your temperature. If the care partner waits in their car they need to stay in the parking lot the entire time and we will call them on their cell phone when the patient is ready for discharge so they can bring the car to the front of the building. Also all patient's will need to wear a mask into building.  covid test 11-17 Tuesday at 130 pm

## 2019-03-26 ENCOUNTER — Encounter: Payer: Self-pay | Admitting: Internal Medicine

## 2019-04-06 ENCOUNTER — Ambulatory Visit (INDEPENDENT_AMBULATORY_CARE_PROVIDER_SITE_OTHER): Payer: Medicare Other

## 2019-04-06 ENCOUNTER — Other Ambulatory Visit: Payer: Self-pay | Admitting: Internal Medicine

## 2019-04-06 DIAGNOSIS — Z03818 Encounter for observation for suspected exposure to other biological agents ruled out: Secondary | ICD-10-CM | POA: Diagnosis not present

## 2019-04-06 DIAGNOSIS — Z1159 Encounter for screening for other viral diseases: Secondary | ICD-10-CM

## 2019-04-07 LAB — SARS CORONAVIRUS 2 (TAT 6-24 HRS): SARS Coronavirus 2: NEGATIVE

## 2019-04-09 ENCOUNTER — Encounter: Payer: Self-pay | Admitting: Internal Medicine

## 2019-04-09 ENCOUNTER — Ambulatory Visit (AMBULATORY_SURGERY_CENTER): Payer: Medicare Other | Admitting: Internal Medicine

## 2019-04-09 ENCOUNTER — Other Ambulatory Visit: Payer: Self-pay

## 2019-04-09 VITALS — BP 112/66 | HR 65 | Temp 98.1°F | Resp 20 | Ht 68.0 in | Wt 176.0 lb

## 2019-04-09 DIAGNOSIS — Z86010 Personal history of colonic polyps: Secondary | ICD-10-CM | POA: Diagnosis not present

## 2019-04-09 DIAGNOSIS — Z800 Family history of malignant neoplasm of digestive organs: Secondary | ICD-10-CM | POA: Diagnosis not present

## 2019-04-09 DIAGNOSIS — E785 Hyperlipidemia, unspecified: Secondary | ICD-10-CM | POA: Diagnosis not present

## 2019-04-09 MED ORDER — SODIUM CHLORIDE 0.9 % IV SOLN: 500.0000 mL | INTRAVENOUS | Status: DC

## 2019-04-09 NOTE — Progress Notes (Signed)
Temp LC V/s KA I have reviewed the patient's medical history in detail and updated the computerized patient record. 

## 2019-04-09 NOTE — Progress Notes (Signed)
Report to PACU, RN, vss, BBS= Clear.  

## 2019-04-09 NOTE — Op Note (Signed)
[facility] Patient Name: Jennifer Frank Procedure Date: 04/09/2019 2:35 PM MRN: [id number] Endoscopist: Gatha Mayer , MD Age: 68 Referring MD:  Date of Birth: [birth year] Gender: Female Account #: [id number] Procedure:                Colonoscopy Indications:              Surveillance: Personal history of adenomatous                            polyps on last colonoscopy 5 years ago and FHx                            colonn cancer Medicines:                Propofol per Anesthesia, Monitored Anesthesia Care Procedure:                Pre-Anesthesia Assessment:                           - Prior to the procedure, a History and Physical                            was performed, and patient medications and                            allergies were reviewed. The patient's tolerance of                            previous anesthesia was also reviewed. The risks                            and benefits of the procedure and the sedation                            options and risks were discussed with the patient.                            All questions were answered, and informed consent                            was obtained. Prior Anticoagulants: The patient has                            taken no previous anticoagulant or antiplatelet                            agents. ASA Grade Assessment: II - A patient with                            mild systemic disease. After reviewing the risks                            and benefits, the patient was deemed in  satisfactory condition to undergo the procedure.                           After obtaining informed consent, the colonoscope                            was passed under direct vision. Throughout the                            procedure, the patient's blood pressure, pulse, and                            oxygen saturations were monitored continuously. The                            Colonoscope was introduced  through the anus and                            advanced to the the cecum, identified by                            appendiceal orifice and ileocecal valve. The                            colonoscopy was performed without difficulty. The                            patient tolerated the procedure well. The quality                            of the bowel preparation was excellent. The bowel                            preparation used was Miralax via split dose                            instruction. The ileocecal valve, appendiceal                            orifice, and rectum were photographed. Scope In: 2:51:33 PM Scope Out: 3:02:13 PM Scope Withdrawal Time: 0 hours 7 minutes 39 seconds  Total Procedure Duration: 0 hours 10 minutes 40 seconds  Findings:                 The perianal and digital rectal examinations were                            normal.                           The colon (entire examined portion) appeared normal.                           No additional abnormalities were found on  retroflexion. Complications:            No immediate complications. Estimated Blood Loss:     Estimated blood loss: none. Impression:               - The entire examined colon is normal.                           - No specimens collected.                           - Personal history of colonic polyp - adenoma 2015                            and FHx CRCA brother. Recommendation:           - Patient has a contact number available for                            emergencies. The signs and symptoms of potential                            delayed complications were discussed with the                            patient. Return to normal activities tomorrow.                            Written discharge instructions were provided to the                            patient.                           - Resume previous diet.                           - Continue present  medications.                           - Repeat colonoscopy in 5 years. Gatha Mayer, MD 04/09/2019 3:07:41 PM This report has been signed electronically.

## 2019-04-09 NOTE — Patient Instructions (Addendum)
No polyps today.  Your next routine colonoscopy should be in 5 years - 2025.  I appreciate the opportunity to care for you. Gatha Mayer, MD, FACG  YOU HAD AN ENDOSCOPIC PROCEDURE TODAY AT [facility]:   Refer to the procedure report that was given to you for any specific questions about what was found during the examination.  If the procedure report does not answer your questions, please call your gastroenterologist to clarify.  If you requested that your care partner not be given the details of your procedure findings, then the procedure report has been included in a sealed envelope for you to review at your convenience later.  YOU SHOULD EXPECT: Some feelings of bloating in the abdomen. Passage of more gas than usual.  Walking can help get rid of the air that was put into your GI tract during the procedure and reduce the bloating. If you had a lower endoscopy (such as a colonoscopy or flexible sigmoidoscopy) you may notice spotting of blood in your stool or on the toilet paper. If you underwent a bowel prep for your procedure, you may not have a normal bowel movement for a few days.  Please Note:  You might notice some irritation and congestion in your nose or some drainage.  This is from the oxygen used during your procedure.  There is no need for concern and it should clear up in a day or so.  SYMPTOMS TO REPORT IMMEDIATELY:   Following lower endoscopy (colonoscopy or flexible sigmoidoscopy):  Excessive amounts of blood in the stool  Significant tenderness or worsening of abdominal pains  Swelling of the abdomen that is new, acute  Fever of 100F or higher   For urgent or emergent issues, a gastroenterologist can be reached at any hour by calling [phone].   DIET:  We do recommend a small meal at first, but then you may proceed to your regular diet.  Drink plenty of fluids but you should avoid alcoholic beverages for 24 hours.  ACTIVITY:  You should plan to  take it easy for the rest of today and you should NOT DRIVE or use heavy machinery until tomorrow (because of the sedation medicines used during the test).    FOLLOW UP: Our staff will call the number listed on your records 48-72 hours following your procedure to check on you and address any questions or concerns that you may have regarding the information given to you following your procedure. If we do not reach you, we will leave a message.  We will attempt to reach you two times.  During this call, we will ask if you have developed any symptoms of COVID 19. If you develop any symptoms (ie: fever, flu-like symptoms, shortness of breath, cough etc.) before then, please call [phone].  If you test positive for Covid 19 in the 2 weeks post procedure, please call and report this information to [place].    If any biopsies were taken you will be contacted by phone or by letter within the next 1-3 weeks.  Please call us at [phone] if you have not heard about the biopsies in 3 weeks.    SIGNATURES/CONFIDENTIALITY: You and/or your care partner have signed paperwork which will be entered into your electronic medical record.  These signatures attest to the fact that that the information above on your After Visit Summary has been reviewed and is understood.  Full responsibility of the confidentiality of this discharge information lies with you  and/or your care-partner. 

## 2019-04-13 ENCOUNTER — Telehealth: Payer: Self-pay | Admitting: *Deleted

## 2019-04-13 NOTE — Telephone Encounter (Signed)
  Follow up Call-  Call back number 04/09/2019  Post procedure Call Back phone  # [phone]  Permission to leave phone message Yes  Some recent data might be hidden     Patient questions:  Do you have a fever, pain , or abdominal swelling? No. Pain Score  0 *  Have you tolerated food without any problems? Yes.    Have you been able to return to your normal activities? Yes.    Do you have any questions about your discharge instructions: Diet   No. Medications  No. Follow up visit  No.  Do you have questions or concerns about your Care? No.  Actions: * If pain score is 4 or above: 1. No action needed, pain <4.Have you developed a fever since your procedure? n0  2.   Have you had an respiratory symptoms (SOB or cough) since your procedure? no  3.   Have you tested positive for COVID 19 since your procedure no  4.   Have you had any family members/close contacts diagnosed with the COVID 19 since your procedure?  no   If yes to any of these questions please route to Joylene John, RN and Alphonsa Gin, Therapist, sports.

## 2019-06-03 DIAGNOSIS — Z1231 Encounter for screening mammogram for malignant neoplasm of breast: Secondary | ICD-10-CM | POA: Diagnosis not present

## 2019-06-03 DIAGNOSIS — Z01419 Encounter for gynecological examination (general) (routine) without abnormal findings: Secondary | ICD-10-CM | POA: Diagnosis not present

## 2019-06-03 DIAGNOSIS — M8588 Other specified disorders of bone density and structure, other site: Secondary | ICD-10-CM | POA: Diagnosis not present

## 2019-06-03 DIAGNOSIS — Z6826 Body mass index (BMI) 26.0-26.9, adult: Secondary | ICD-10-CM | POA: Diagnosis not present

## 2019-06-03 LAB — HM DEXA SCAN: HM Dexa Scan: NORMAL

## 2019-06-08 ENCOUNTER — Ambulatory Visit: Payer: Medicare Other | Attending: Internal Medicine

## 2019-06-08 DIAGNOSIS — Z23 Encounter for immunization: Secondary | ICD-10-CM | POA: Insufficient documentation

## 2019-06-08 NOTE — Progress Notes (Signed)
   Covid-19 Vaccination Clinic  Name:  Jennifer Frank    MRN: [id number] DOB: 1950-08-08  06/08/2019  Jennifer Frank was observed post Covid-19 immunization for 15 minutes without incidence. She was provided with Vaccine Information Sheet and instruction to access the V-Safe system.   Jennifer Frank was instructed to call 911 with any severe reactions post vaccine: Marland Kitchen Difficulty breathing  . Swelling of your face and throat  . A fast heartbeat  . A bad rash all over your body  . Dizziness and weakness    Immunizations Administered    Name Date Dose VIS Date Route   Pfizer COVID-19 Vaccine 06/08/2019 10:07 AM 0.3 mL 04/30/2019 Intramuscular   Manufacturer: [employer]   Lot: [id number]   [place]: [id number]

## 2019-06-18 ENCOUNTER — Other Ambulatory Visit: Payer: Self-pay | Admitting: Family Medicine

## 2019-06-27 ENCOUNTER — Ambulatory Visit: Payer: Medicare Other | Attending: Internal Medicine

## 2019-06-27 DIAGNOSIS — Z23 Encounter for immunization: Secondary | ICD-10-CM

## 2019-06-27 NOTE — Progress Notes (Signed)
   Covid-19 Vaccination Clinic  Name:  Jennifer Frank    MRN: [id number] DOB: 04/21/1951  06/27/2019  [name] was observed post Covid-19 immunization for 15 minutes without incidence. She was provided with Vaccine Information Sheet and instruction to access the V-Safe system.   [name] was instructed to call 911 with any severe reactions post vaccine: Marland Kitchen Difficulty breathing  . Swelling of your face and throat  . A fast heartbeat  . A bad rash all over your body  . Dizziness and weakness    Immunizations Administered    Name Date Dose VIS Date Route   Pfizer COVID-19 Vaccine 06/27/2019 12:54 PM 0.3 mL 04/30/2019 Intramuscular   Manufacturer: [place]   Lot: [id number]   [place]: [id number]

## 2020-02-13 ENCOUNTER — Other Ambulatory Visit: Payer: Self-pay | Admitting: Family Medicine

## 2020-02-13 DIAGNOSIS — E785 Hyperlipidemia, unspecified: Secondary | ICD-10-CM

## 2020-02-23 ENCOUNTER — Other Ambulatory Visit: Payer: Self-pay

## 2020-02-23 ENCOUNTER — Ambulatory Visit (INDEPENDENT_AMBULATORY_CARE_PROVIDER_SITE_OTHER): Payer: Medicare Other

## 2020-02-23 DIAGNOSIS — Z0000 Encounter for general adult medical examination without abnormal findings: Secondary | ICD-10-CM | POA: Diagnosis not present

## 2020-02-23 NOTE — Patient Instructions (Signed)
[name] , Thank you for taking time to come for your Medicare Wellness Visit. I appreciate your ongoing commitment to your health goals. Please review the following plan we discussed and let me know if I can assist you in the future.   Screening recommendations/referrals: Colonoscopy: Up to date, completed 04/09/2019, due 03/2024 Mammogram: due, will discuss with provider  Bone Density: Up to date, completed 03/20/2016, due 2-5 years  Recommended yearly ophthalmology/optometry visit for glaucoma screening and checkup Recommended yearly dental visit for hygiene and checkup  Vaccinations: Influenza vaccine: due, will get at upcoming physical  Pneumococcal vaccine: Patient states that she received a pneumonia vaccine last year at [facility]. Please bring the records to your physical so we can see if you are due for the second vaccine. Tdap vaccine: Up to date, completed 03/17/2014, due 02/2024 Shingles vaccine: due, check with your insurance regarding coverage if interested   Covid-19:Completed series  Advanced directives: Please bring a copy of your POA (Power of [place]) and/or Living Will to your next appointment.  Conditions/risks identified: hyperlipidemia  Next appointment: Follow up in one year for your annual wellness visit    Preventive Care 65 Years and Older, Female Preventive care refers to lifestyle choices and visits with your health care provider that can promote health and wellness. What does preventive care include?  A yearly physical exam. This is also called an annual well check.  Dental exams once or twice a year.  Routine eye exams. Ask your health care provider how often you should have your eyes checked.  Personal lifestyle choices, including:  Daily care of your teeth and gums.  Regular physical activity.  Eating a healthy diet.  Avoiding tobacco and drug use.  Limiting alcohol use.  Practicing safe sex.  Taking low-dose aspirin every day.  Taking  vitamin and mineral supplements as recommended by your health care provider. What happens during an annual well check? The services and screenings done by your health care provider during your annual well check will depend on your age, overall health, lifestyle risk factors, and family history of disease. Counseling  Your health care provider may ask you questions about your:  Alcohol use.  Tobacco use.  Drug use.  Emotional well-being.  Home and relationship well-being.  Sexual activity.  Eating habits.  History of falls.  Memory and ability to understand (cognition).  Work and work Statistician.  Reproductive health. Screening  You may have the following tests or measurements:  Height, weight, and BMI.  Blood pressure.  Lipid and cholesterol levels. These may be checked every 5 years, or more frequently if you are over 33 years old.  Skin check.  Lung cancer screening. You may have this screening every year starting at age 43 if you have a 30-pack-year history of smoking and currently smoke or have quit within the past 15 years.  Fecal occult blood test (FOBT) of the stool. You may have this test every year starting at age 36.  Flexible sigmoidoscopy or colonoscopy. You may have a sigmoidoscopy every 5 years or a colonoscopy every 10 years starting at age 44.  Hepatitis C blood test.  Hepatitis B blood test.  Sexually transmitted disease (STD) testing.  Diabetes screening. This is done by checking your blood sugar (glucose) after you have not eaten for a while (fasting). You may have this done every 1-3 years.  Bone density scan. This is done to screen for osteoporosis. You may have this done starting at age 27.  Mammogram. This may be done every 1-2 years. Talk to your health care provider about how often you should have regular mammograms. Talk with your health care provider about your test results, treatment options, and if necessary, the need for more  tests. Vaccines  Your health care provider may recommend certain vaccines, such as:  Influenza vaccine. This is recommended every year.  Tetanus, diphtheria, and acellular pertussis (Tdap, Td) vaccine. You may need a Td booster every 10 years.  Zoster vaccine. You may need this after age 41.  Pneumococcal 13-valent conjugate (PCV13) vaccine. One dose is recommended after age 73.  Pneumococcal polysaccharide (PPSV23) vaccine. One dose is recommended after age 47. Talk to your health care provider about which screenings and vaccines you need and how often you need them. This information is not intended to replace advice given to you by your health care provider. Make sure you discuss any questions you have with your health care provider. Document Released: 06/02/2015 Document Revised: 01/24/2016 Document Reviewed: 03/07/2015 Elsevier Interactive Patient Education  2017 [place] Prevention in the Home Falls can cause injuries. They can happen to people of all ages. There are many things you can do to make your home safe and to help prevent falls. What can I do on the outside of my home?  Regularly fix the edges of walkways and driveways and fix any cracks.  Remove anything that might make you trip as you walk through a door, such as a raised step or threshold.  Trim any bushes or trees on the path to your home.  Use bright outdoor lighting.  Clear any walking paths of anything that might make someone trip, such as rocks or tools.  Regularly check to see if handrails are loose or broken. Make sure that both sides of any steps have handrails.  Any raised decks and porches should have guardrails on the edges.  Have any leaves, snow, or ice cleared regularly.  Use sand or salt on walking paths during winter.  Clean up any spills in your garage right away. This includes oil or grease spills. What can I do in the bathroom?  Use night lights.  Install grab bars by the  toilet and in the tub and shower. Do not use towel bars as grab bars.  Use non-skid mats or decals in the tub or shower.  If you need to sit down in the shower, use a plastic, non-slip stool.  Keep the floor dry. Clean up any water that spills on the floor as soon as it happens.  Remove soap buildup in the tub or shower regularly.  Attach bath mats securely with double-sided non-slip rug tape.  Do not have throw rugs and other things on the floor that can make you trip. What can I do in the bedroom?  Use night lights.  Make sure that you have a light by your bed that is easy to reach.  Do not use any sheets or blankets that are too big for your bed. They should not hang down onto the floor.  Have a firm chair that has side arms. You can use this for support while you get dressed.  Do not have throw rugs and other things on the floor that can make you trip. What can I do in the kitchen?  Clean up any spills right away.  Avoid walking on wet floors.  Keep items that you use a lot in easy-to-reach places.  If you need to reach  something above you, use a strong step stool that has a grab bar.  Keep electrical cords out of the way.  Do not use floor polish or wax that makes floors slippery. If you must use wax, use non-skid floor wax.  Do not have throw rugs and other things on the floor that can make you trip. What can I do with my stairs?  Do not leave any items on the stairs.  Make sure that there are handrails on both sides of the stairs and use them. Fix handrails that are broken or loose. Make sure that handrails are as long as the stairways.  Check any carpeting to make sure that it is firmly attached to the stairs. Fix any carpet that is loose or worn.  Avoid having throw rugs at the top or bottom of the stairs. If you do have throw rugs, attach them to the floor with carpet tape.  Make sure that you have a light switch at the top of the stairs and the bottom of  the stairs. If you do not have them, ask someone to add them for you. What else can I do to help prevent falls?  Wear shoes that:  Do not have high heels.  Have rubber bottoms.  Are comfortable and fit you well.  Are closed at the toe. Do not wear sandals.  If you use a stepladder:  Make sure that it is fully opened. Do not climb a closed stepladder.  Make sure that both sides of the stepladder are locked into place.  Ask someone to hold it for you, if possible.  Clearly mark and make sure that you can see:  Any grab bars or handrails.  First and last steps.  Where the edge of each step is.  Use tools that help you move around (mobility aids) if they are needed. These include:  Canes.  Walkers.  Scooters.  Crutches.  Turn on the lights when you go into a dark area. Replace any light bulbs as soon as they burn out.  Set up your furniture so you have a clear path. Avoid moving your furniture around.  If any of your floors are uneven, fix them.  If there are any pets around you, be aware of where they are.  Review your medicines with your doctor. Some medicines can make you feel dizzy. This can increase your chance of falling. Ask your doctor what other things that you can do to help prevent falls. This information is not intended to replace advice given to you by your health care provider. Make sure you discuss any questions you have with your health care provider. Document Released: 03/02/2009 Document Revised: 10/12/2015 Document Reviewed: 06/10/2014 Elsevier Interactive Patient Education  2017 [employer].

## 2020-02-23 NOTE — Progress Notes (Signed)
Subjective:   Jennifer Frank is a 69 y.o. female who presents for Medicare Annual (Subsequent) preventive examination.  Review of Systems: N/A     I connected with the patient today by telephone and verified that I am speaking with the correct person using two identifiers. Location patient: home Location nurse: work Persons participating in the telephone visit: patient, nurse.   I discussed the limitations, risks, security and privacy concerns of performing an evaluation and management service by telephone and the availability of in person appointments. I also discussed with the patient that there may be a patient responsible charge related to this service. The patient expressed understanding and verbally consented to this telephonic visit.        Cardiac Risk Factors include: advanced age (>73men, >44 women);Other (see comment), Risk factor comments: hyperlipidemia     Objective:    Today's Vitals   There is no height or weight on file to calculate BMI.  Advanced Directives 02/23/2020 12/09/2017 05/16/2016 01/28/2016 06/06/2015  Does Patient Have a Medical Advance Directive? Yes Yes Yes Yes No  Type of Paramedic of [place];Living will [place];Living will [place];Living will [place];Living will -  Does patient want to make changes to medical advance directive? - - No - Patient declined No - Patient declined -  Copy of [place] in Chart? No - copy requested No - copy requested No - copy requested No - copy requested -  Would patient like information on creating a medical advance directive? - - - - No - patient declined information    Current Medications (verified) Outpatient Encounter Medications as of 02/23/2020  Medication Sig   oxybutynin (DITROPAN-XL) 5 MG 24 hr tablet TAKE 1 TABLET BY MOUTH EVERYDAY AT BEDTIME   No facility-administered encounter medications on file  as of 02/23/2020.    Allergies (verified) Acetaminophen-codeine, Darvocet [propoxyphene n-acetaminophen], Morphine and related, and Vioxx [rofecoxib]   History: Past Medical History:  Diagnosis Date   Arthritis    Clotting disorder ([place])    lower left leg after hip replacement    Fundic gland polyps of stomach, benign    GERD (gastroesophageal reflux disease)    past hx    Hearing loss    IBS (irritable bowel syndrome)    Personal history of colonic polyps - adenoma 09/22/2013   PONV (postoperative nausea and vomiting)    Reflux    Past Surgical History:  Procedure Laterality Date   COLONOSCOPY     ESOPHAGOGASTRODUODENOSCOPY     HIP ARTHROSCOPY Left 1997   JOINT REPLACEMENT Bilateral [id number]   hip   KNEE ARTHROPLASTY Left 12/2015   KNEE ARTHROSCOPY Left 1996   KNEE ARTHROSCOPY Right    05-2016 same time as left knee replacement    POLYPECTOMY     TONSILLECTOMY AND ADENOIDECTOMY  1957   TOTAL KNEE ARTHROPLASTY Bilateral 05/27/2016   Procedure: LEFT TOTAL KNEE ARTHROPLASTY WITH RIGHT KNEE ARTHROSCOPY;  Surgeon: Frederik Pear, MD;  Location: [facility];  Service: Orthopedics;  Laterality: Bilateral;   TOTAL KNEE ARTHROPLASTY Left    05-2016   UPPER GASTROINTESTINAL ENDOSCOPY     WISDOM TOOTH EXTRACTION     Family History  Problem Relation Age of Onset   Deep vein thrombosis Mother    Heart disease Mother        before age 32   Hyperlipidemia Mother    Hypertension Mother    Other Mother  varicose veins   Heart attack Mother    Peripheral vascular disease Mother    Bleeding Disorder Mother    Colon polyps Mother    Deep vein thrombosis Father    Heart disease Father        before age 26   Hyperlipidemia Father    Hypertension Father    Heart attack Father    Bleeding Disorder Father    AAA (abdominal aortic aneurysm) Father    Colon polyps Father    Cancer Brother    Hyperlipidemia Brother    Colon cancer Brother 57        died 02/17/2008   Breast cancer Paternal 15    Colon polyps Brother    Colon polyps Son    Crohn's disease Son    Esophageal cancer Neg Hx    Stomach cancer Neg Hx    Rectal cancer Neg Hx    Social History   Socioeconomic History   Marital status: Married    Spouse name: Not on file   Number of children: 2   Years of education: Not on file   Highest education level: Not on file  Occupational History   Occupation: Retired    Fish farm manager: RETIRED  Tobacco Use   Smoking status: Never Smoker   Smokeless tobacco: Never Used  Scientific laboratory technician Use: Never used  Substance and Sexual Activity   Alcohol use: Yes    Alcohol/week: 1.0 standard drink    Types: 1 Glasses of wine per week    Comment: rarely   Drug use: No   Sexual activity: Not on file  Other Topics Concern   Not on file  Social History Narrative   Married since 1975   2 kids   Social Determinants of Health   Financial Resource Strain: Low Risk    Difficulty of Paying Living Expenses: Not hard at all  Food Insecurity: No Food Insecurity   Worried About Charity fundraiser in the Last Year: Never true   Arboriculturist in the Last Year: Never true  Transportation Needs: No Transportation Needs   Lack of Transportation (Medical): No   Lack of Transportation (Non-Medical): No  Physical Activity: Inactive   Days of Exercise per Week: 0 days   Minutes of Exercise per Session: 0 min  Stress: No Stress Concern Present   Feeling of Stress : Not at all  Social Connections:    Frequency of Communication with Friends and Family: Not on file   Frequency of Social Gatherings with Friends and Family: Not on file   Attends Religious Services: Not on Electrical engineer or Organizations: Not on file   Attends Archivist Meetings: Not on file   Marital Status: Not on file    Tobacco Counseling Counseling given: Not Answered   Clinical Intake:  Pre-visit  preparation completed: Yes  Pain : No/denies pain     Nutritional Risks: None Diabetes: No  How often do you need to have someone help you when you read instructions, pamphlets, or other written materials from your doctor or pharmacy?: 1 - Never What is the last grade level you completed in school?: 2 years of college  Diabetic: No Nutrition Risk Assessment:  Has the patient had any N/V/D within the last 2 months?  No  Does the patient have any non-healing wounds?  No  Has the patient had any unintentional weight loss or weight gain?  No  Diabetes:  Is the patient diabetic?  No  If diabetic, was a CBG obtained today?  N/A Did the patient bring in their glucometer from home?  N/A How often do you monitor your CBG's? N/A.   Financial Strains and Diabetes Management:  Are you having any financial strains with the device, your supplies or your medication? N/A.  Does the patient want to be seen by Chronic Care Management for management of their diabetes?  N/A Would the patient like to be referred to a Nutritionist or for Diabetic Management?  N/A    Interpreter Needed?: No  Information entered by :: CJohnson, LPN   Activities of Daily Living In your present state of health, do you have any difficulty performing the following activities: 02/23/2020  Hearing? Y  Comment hearing loss in right ear  Vision? N  Difficulty concentrating or making decisions? N  Walking or climbing stairs? N  Dressing or bathing? N  Doing errands, shopping? N  Preparing Food and eating ? N  Using the Toilet? N  In the past six months, have you accidently leaked urine? Y  Comment wears depends  Do you have problems with loss of bowel control? N  Managing your Medications? N  Managing your Finances? N  Housekeeping or managing your Housekeeping? N  Some recent data might be hidden    Patient Care Team: Tonia Ghent, MD as PCP - General (Family Medicine)  Indicate any recent Medical  Services you may have received from other than [facility] providers in the past year (date may be approximate).     Assessment:   This is a routine wellness examination for Lashana.  Hearing/Vision screen  Hearing Screening   125Hz  250Hz  500Hz  1000Hz  2000Hz  3000Hz  4000Hz  6000Hz  8000Hz   Right ear:           Left ear:           Vision Screening Comments: Patient gets annual eye exams    Dietary issues and exercise activities discussed: Current Exercise Habits: The patient does not participate in regular exercise at present, Exercise limited by: None identified  Goals     DIET - INCREASE WATER INTAKE     Starting 12/09/2017, I will attempt to drink at least 6-8 glasses of water daily.      Patient Stated     02/23/2020, I will maintain and continue medications as prescribed.       Depression Screen PHQ 2/9 Scores 02/23/2020 01/04/2019 12/09/2017 06/23/2017 04/22/2016  PHQ - 2 Score 0 0 0 0 0  PHQ- 9 Score 0 - 0 - -    Fall Risk Fall Risk  02/23/2020 01/04/2019 12/09/2017 06/23/2017 04/22/2016  Falls in the past year? 1 0 No No Yes  Comment tripped and fell in her house - - - -  Number falls in past yr: 0 - - - 2 or more  Injury with Fall? 0 - - - No  Risk for fall due to : No Fall Risks - - - -  Follow up Falls evaluation completed;Falls prevention discussed - - - -    Any stairs in or around the home? Yes  If so, are there any without handrails? No  Home free of loose throw rugs in walkways, pet beds, electrical cords, etc? Yes  Adequate lighting in your home to reduce risk of falls? Yes   ASSISTIVE DEVICES UTILIZED TO PREVENT FALLS:  Life alert? No  Use of a cane, walker or w/c? No  Grab bars  in the bathroom? Yes  Shower chair or bench in shower? No  Elevated toilet seat or a handicapped toilet? No   TIMED UP AND GO:  Was the test performed? N/A, telephonic visit .    Cognitive Function: MMSE - Mini Mental State Exam 02/23/2020 12/09/2017  Orientation to time 5 5  Orientation  to Place 5 5  Registration 3 3  Attention/ Calculation 5 0  Recall 3 0  Recall-comments - unable to recall 3 of 3 words  Language- name 2 objects - 0  Language- repeat 1 1  Language- follow 3 step command - 3  Language- read & follow direction - 0  Write a sentence - 0  Copy design - 0  Total score - 17  Mini Cog  Mini-Cog screen was completed. Maximum score is 22. A value of 0 denotes this part of the MMSE was not completed or the patient failed this part of the Mini-Cog screening.       Immunizations Immunization History  Administered Date(s) Administered   Influenza Whole 02/21/2010, 02/21/2014   PFIZER SARS-COV-2 Vaccination 06/08/2019, 06/27/2019   PPD Test 05/30/2016   Td 01/16/2009   Tdap 03/17/2014    TDAP status: Up to date Flu Vaccine status: due, will get at upcoming physical  Pneumococcal vaccine status: Patient states that she received a pneumonia vaccine last year at [facility]. She will bring the records to her physical so we can see if she is due for the second vaccine. Covid-19 vaccine status: Completed vaccines  Qualifies for Shingles Vaccine? Yes   Zostavax completed No   Shingrix Completed?: No.    Education has been provided regarding the importance of this vaccine. Patient has been advised to call insurance company to determine out of pocket expense if they have not yet received this vaccine. Advised may also receive vaccine at local pharmacy or Health Dept. Verbalized acceptance and understanding.  Screening Tests Health Maintenance  Topic Date Due   PNA vac Low Risk Adult (1 of 2 - PCV13) Never done   INFLUENZA VACCINE  12/19/2019   MAMMOGRAM  12/19/2019   TETANUS/TDAP  03/17/2024   COLONOSCOPY  04/08/2024   DEXA SCAN  Completed   COVID-19 Vaccine  Completed   Hepatitis C Screening  Completed    Health Maintenance  Health Maintenance Due  Topic Date Due   PNA vac Low Risk Adult (1 of 2 - PCV13) Never done   INFLUENZA VACCINE   12/19/2019   MAMMOGRAM  12/19/2019    Colorectal cancer screening: Completed 04/09/2019. Repeat every 5 years Mammogram status: due, will discuss with provider Bone Density status: Completed 03/20/2016. Results reflect: Bone density results: NORMAL. Repeat every 2-5 years.  Lung Cancer Screening: (Low Dose CT Chest recommended if Age 69-80 years, 30 pack-year currently smoking OR have quit w/in 15 years.) does not qualify.   Additional Screening:  Hepatitis C Screening: does qualify; Completed 04/22/2016  Vision Screening: Recommended annual ophthalmology exams for early detection of glaucoma and other disorders of the eye. Is the patient up to date with their annual eye exam?  Yes  Who is the provider or what is the name of the office in which the patient attends annual eye exams? [facility] If pt is not established with a provider, would they like to be referred to a provider to establish care? No .   Dental Screening: Recommended annual dental exams for proper oral hygiene  Community Resource Referral / Chronic Care Management: CRR required  this visit?  No   CCM required this visit?  No      Plan:     I have personally reviewed and noted the following in the patients chart:    Medical and social history  Use of alcohol, tobacco or illicit drugs   Current medications and supplements  Functional ability and status  Nutritional status  Physical activity  Advanced directives  List of other physicians  Hospitalizations, surgeries, and ER visits in previous 12 months  Vitals  Screenings to include cognitive, depression, and falls  Referrals and appointments  In addition, I have reviewed and discussed with patient certain preventive protocols, quality metrics, and best practice recommendations. A written personalized care plan for preventive services as well as general preventive health recommendations were provided to patient.   Due to this being a  telephonic visit, the after visit summary with patients personalized plan was offered to patient via office or my-chart. Patient preferred to pick up at office at next visit or via mychart.   Andrez Grime, LPN   [id number]

## 2020-02-23 NOTE — Progress Notes (Signed)
PCP notes:  Health Maintenance: Flu- due Mammogram- due Pneumococcal- Patient states that she received a pneumonia vaccine last year at [facility]. She will bring the records to her physical so we can see if she is due for the second vaccine.   Abnormal Screenings: none   Patient concerns: Patient wants to discuss blood work to check for cancers.   Nurse concerns: none   Next PCP appt.: 02/28/2020 @ 2 pm

## 2020-02-25 ENCOUNTER — Other Ambulatory Visit: Payer: Self-pay

## 2020-02-25 ENCOUNTER — Other Ambulatory Visit (INDEPENDENT_AMBULATORY_CARE_PROVIDER_SITE_OTHER): Payer: Medicare Other

## 2020-02-25 DIAGNOSIS — E785 Hyperlipidemia, unspecified: Secondary | ICD-10-CM

## 2020-02-25 LAB — COMPREHENSIVE METABOLIC PANEL
ALT: 12 U/L (ref 0–35)
AST: 16 U/L (ref 0–37)
Albumin: 3.9 g/dL (ref 3.5–5.2)
Alkaline Phosphatase: 63 U/L (ref 39–117)
BUN: 14 mg/dL (ref 6–23)
CO2: 30 mEq/L (ref 19–32)
Calcium: 9.5 mg/dL (ref 8.4–10.5)
Chloride: 105 mEq/L (ref 96–112)
Creatinine, Ser: 0.87 mg/dL (ref 0.40–1.20)
GFR: 67.87 mL/min (ref >60.00)
Glucose, Bld: 83 mg/dL (ref 70–99)
Potassium: 4.6 mEq/L (ref 3.5–5.1)
Sodium: 140 mEq/L (ref 135–145)
Total Bilirubin: 0.3 mg/dL (ref 0.2–1.2)
Total Protein: 7.1 g/dL (ref 6.0–8.3)

## 2020-02-25 LAB — LIPID PANEL
Cholesterol: 223 mg/dL — ABNORMAL HIGH (ref 0–200)
HDL: 81.6 mg/dL (ref >39.00)
LDL Cholesterol: 133 mg/dL — ABNORMAL HIGH (ref 0–99)
NonHDL: 141.46
Total CHOL/HDL Ratio: 3
Triglycerides: 41 mg/dL (ref 0.0–149.0)
VLDL: 8.2 mg/dL (ref 0.0–40.0)

## 2020-02-28 ENCOUNTER — Ambulatory Visit: Payer: Medicare Other | Admitting: Family Medicine

## 2020-03-05 ENCOUNTER — Encounter: Payer: Self-pay | Admitting: Family Medicine

## 2020-03-06 ENCOUNTER — Telehealth: Payer: Self-pay | Admitting: *Deleted

## 2020-03-06 ENCOUNTER — Encounter: Payer: Self-pay | Admitting: Family Medicine

## 2020-03-06 ENCOUNTER — Other Ambulatory Visit: Payer: Self-pay

## 2020-03-06 ENCOUNTER — Ambulatory Visit: Payer: Medicare Other | Admitting: Family Medicine

## 2020-03-06 ENCOUNTER — Ambulatory Visit (INDEPENDENT_AMBULATORY_CARE_PROVIDER_SITE_OTHER)
Admission: RE | Admit: 2020-03-06 | Discharge: 2020-03-06 | Disposition: A | Discharge status: Home / Self Care | Payer: Medicare Other | Source: Ambulatory Visit | Attending: Family Medicine | Admitting: Family Medicine

## 2020-03-06 VITALS — BP 134/80 | HR 68 | Temp 97.4°F | Resp 16 | Ht 67.99 in | Wt 176.2 lb

## 2020-03-06 DIAGNOSIS — R109 Unspecified abdominal pain: Secondary | ICD-10-CM | POA: Insufficient documentation

## 2020-03-06 DIAGNOSIS — R1012 Left upper quadrant pain: Secondary | ICD-10-CM | POA: Diagnosis not present

## 2020-03-06 DIAGNOSIS — Z23 Encounter for immunization: Secondary | ICD-10-CM

## 2020-03-06 DIAGNOSIS — M47816 Spondylosis without myelopathy or radiculopathy, lumbar region: Secondary | ICD-10-CM | POA: Diagnosis not present

## 2020-03-06 LAB — CBC WITH DIFFERENTIAL/PLATELET
Basophils Absolute: 0.1 10*3/uL (ref 0.0–0.1)
Basophils Relative: 0.8 % (ref 0.0–3.0)
Eosinophils Absolute: 0.1 10*3/uL (ref 0.0–0.7)
Eosinophils Relative: 1.7 % (ref 0.0–5.0)
HCT: 39.4 % (ref 36.0–46.0)
Hemoglobin: 13.1 g/dL (ref 12.0–15.0)
Lymphocytes Relative: 24.3 % (ref 12.0–46.0)
Lymphs Abs: 1.7 10*3/uL (ref 0.7–4.0)
MCHC: 33.1 g/dL (ref 30.0–36.0)
MCV: 87.7 fl (ref 78.0–100.0)
Monocytes Absolute: 0.4 10*3/uL (ref 0.1–1.0)
Monocytes Relative: 6.4 % (ref 3.0–12.0)
Neutro Abs: 4.7 10*3/uL (ref 1.4–7.7)
Neutrophils Relative %: 66.8 % (ref 43.0–77.0)
Platelets: 251 10*3/uL (ref 150.0–400.0)
RBC: 4.49 Mil/uL (ref 3.87–5.11)
RDW: 13.7 % (ref 11.5–15.5)
WBC: 7 10*3/uL (ref 4.0–10.5)

## 2020-03-06 LAB — POCT URINALYSIS DIPSTICK
Bilirubin, UA: NEGATIVE
Blood, UA: NEGATIVE
Glucose, UA: NEGATIVE
Ketones, UA: NEGATIVE
Leukocytes, UA: NEGATIVE
Nitrite, UA: NEGATIVE
Protein, UA: NEGATIVE
Spec Grav, UA: 1.015 (ref 1.010–1.025)
Urobilinogen, UA: 0.2 E.U./dL
pH, UA: 7 (ref 5.0–8.0)

## 2020-03-06 LAB — SEDIMENTATION RATE: Sed Rate: 36 mm/hr — ABNORMAL HIGH (ref 0–30)

## 2020-03-06 LAB — LIPASE: Lipase: 44 U/L (ref 11.0–59.0)

## 2020-03-06 NOTE — Telephone Encounter (Addendum)
I think she should be seen sooner than Thursday. Please offer OV for further evaluation.  I could see at 11am or 12:30pm today.  Or with other available provider.

## 2020-03-06 NOTE — Telephone Encounter (Signed)
Good morning  I have been nursing a sudden onset of pain, (no injury that I can think of) constant dull, but stabbing pain at times and particularly on movement for the past week.  I have been using Advil for the discomfort and also used ice yesterday and during the last night.  I can't any pressure against the area without making it worse like lying on that side or sitting against anything that would put pressure on that area. This morning I have really no numbness or tingling effect in the new area it seems to have spread to but just a noticeable spread to under shoulder blade and shoulder area itself. All still on left side. But the main source of pain is still where it began in the the lower left side My husband, Gwyndolyn Saxon and I have our physical appointments  with Dr Damita Dunnings on Thursday at 2/2:30, so I was wondering if perhaps Dr Damita Dunnings would want to order an X-ray or something at [facility] prior to my appointment on Thursday. Thank you for your time in reading this and your followup. Zykerria Tanton [phone]

## 2020-03-06 NOTE — Progress Notes (Addendum)
This visit was conducted in person.  BP 134/80 (BP Location: Left Arm, Patient Position: Sitting)   Pulse 68   Temp (!) 97.4 F (36.3 C)   Resp 16   Ht 5' 7.99" (1.727 m)   Wt 176 lb 3.2 oz (79.9 kg)   SpO2 98%   BMI 26.80 kg/m    CC: lower back pain  Subjective:    Patient ID: Jennifer Frank, female    DOB: [birth year], 69 y.o.   MRN: [id number]  HPI: Jennifer Frank is a 69 y.o. female presenting on 03/06/2020 for Back Pain (pt c/o lower left back pain x1week. Pain has travelled along left side and moved to under left ribs x2days)   1 wk h/o left flank pain with radiation to left side and under ribcage. Last night developed band like pain across lower back, bilaterally. No lower abdominal pain. No vesicular rash. She has not had shingles vaccines. Managing pain with advil. She did have a fall onto hardwood floor on 01/2020 (tripped), doesn't remember injury, no premonitory symptoms. Has been treating with advil 400mg  for pain. Hasn't tried tylenol.   No fevers/chills, nausea, vomiting, diarrhea, dysuria, hematuria. No bowel changes or blood in stool. BMs are very regular daily.   Known arthritis of lower back, s/p knee and hip replacement in the past.  Son had ruptured spleen presenting similarly - without known etiology.  Nephew had colon cancer. Older brother had intestinal cancer.   Upcoming appt Thursday with PCP.  Colonoscopy 03/2019 - WNL Carlean Purl) Yearly mammo normal - due for rpt     Relevant past medical, surgical, family and social history reviewed and updated as indicated. Interim medical history since our last visit reviewed. Allergies and medications reviewed and updated. Outpatient Medications Prior to Visit  Medication Sig Dispense Refill  . oxybutynin (DITROPAN-XL) 5 MG 24 hr tablet TAKE 1 TABLET BY MOUTH EVERYDAY AT BEDTIME 90 tablet 1  . betamethasone valerate (VALISONE) 0.1 % cream betamethasone valerate 0.1 % topical cream  APPLY A THIN LAYER TO THE  AFFECTED AREA(S) BY TOPICAL ROUTE ONCE DAILY AS NEEDED     No facility-administered medications prior to visit.     Per HPI unless specifically indicated in ROS section below Review of Systems Objective:  BP 134/80 (BP Location: Left Arm, Patient Position: Sitting)   Pulse 68   Temp (!) 97.4 F (36.3 C)   Resp 16   Ht 5' 7.99" (1.727 m)   Wt 176 lb 3.2 oz (79.9 kg)   SpO2 98%   BMI 26.80 kg/m   Wt Readings from Last 3 Encounters:  03/06/20 176 lb 3.2 oz (79.9 kg)  04/09/19 176 lb (79.8 kg)  03/24/19 176 lb (79.8 kg)      Physical Exam Vitals and nursing note reviewed.  Constitutional:      Appearance: Normal appearance. She is not ill-appearing.  HENT:     Ears:     Comments: HOH Eyes:     Extraocular Movements: Extraocular movements intact.     Pupils: Pupils are equal, round, and reactive to light.  Cardiovascular:     Rate and Rhythm: Normal rate and regular rhythm.     Pulses: Normal pulses.     Heart sounds: Normal heart sounds. No murmur heard.   Pulmonary:     Effort: Pulmonary effort is normal. No respiratory distress.     Breath sounds: Normal breath sounds. No wheezing, rhonchi or rales.  Abdominal:     General:  Bowel sounds are normal. There is no distension.     Palpations: Abdomen is soft. There is no mass.     Tenderness: There is abdominal tenderness (mod-marked) in the left upper quadrant. There is left CVA tenderness and guarding. There is no right CVA tenderness or rebound. Negative signs include Murphy's sign.     Hernia: No hernia is present.  Musculoskeletal:     Right lower leg: No edema.     Left lower leg: No edema.     Comments: No midline spine tenderness, mild discomfort bilateral lower lumbar paraspinous mm  Skin:    General: Skin is warm and dry.     Findings: No erythema or rash.     Comments: No vesicular dermatomal rash  Neurological:     Mental Status: She is alert.  Psychiatric:        Mood and Affect: Mood normal.         Results for orders placed or performed in visit on 03/06/20  Sedimentation rate  Result Value Ref Range   Sed Rate 36 (H) 0 - 30 mm/hr  CBC with Differential/Platelet  Result Value Ref Range   WBC 7.0 4.0 - 10.5 K/uL   RBC 4.49 3.87 - 5.11 Mil/uL   Hemoglobin 13.1 12.0 - 15.0 g/dL   HCT 39.4 36 - 46 %   MCV 87.7 78.0 - 100.0 fl   MCHC 33.1 30.0 - 36.0 g/dL   RDW 13.7 11.5 - 15.5 %   Platelets 251.0 150 - 400 K/uL   Neutrophils Relative % 66.8 43 - 77 %   Lymphocytes Relative 24.3 12 - 46 %   Monocytes Relative 6.4 3 - 12 %   Eosinophils Relative 1.7 0 - 5 %   Basophils Relative 0.8 0 - 3 %   Neutro Abs 4.7 1.4 - 7.7 K/uL   Lymphs Abs 1.7 0.7 - 4.0 K/uL   Monocytes Absolute 0.4 0.1 - 1.0 K/uL   Eosinophils Absolute 0.1 0.0 - 0.7 K/uL   Basophils Absolute 0.1 0.0 - 0.1 K/uL  Lipase  Result Value Ref Range   Lipase 44.0 11 - 59 U/L  Urinalysis Dipstick  Result Value Ref Range   Color, UA PALE YELLOW    Clarity, UA CLEAR    Glucose, UA Negative Negative   Bilirubin, UA negative    Ketones, UA negative    Spec Grav, UA 1.015 1.010 - 1.025   Blood, UA negative    pH, UA 7.0 5.0 - 8.0   Protein, UA Negative Negative   Urobilinogen, UA 0.2 0.2 or 1.0 E.U./dL   Nitrite, UA negative    Leukocytes, UA Negative Negative   Appearance     Odor     Lab Results  Component Value Date   CREATININE 0.87 02/25/2020   BUN 14 02/25/2020   NA 140 02/25/2020   K 4.6 02/25/2020   CL 105 02/25/2020   CO2 30 02/25/2020    Assessment & Plan:  This visit occurred during the SARS-CoV-2 public health emergency.  Safety protocols were in place, including screening questions prior to the visit, additional usage of staff PPE, and extensive cleaning of exam room while observing appropriate contact time as indicated for disinfecting solutions.   Problem List Items Addressed This Visit    Left flank pain - Primary    Unclear etiology. UA negative pointing against kidney stone (and no urinary  symptoms). Acute abd series today without obvious cause. She did have a fall  last month - ?hematoma. Check labs today, low threshold to check CT imaging. In interim, discussed tylenol, NSAID dosing PRN pain.       Relevant Orders   DG Abd Acute W/Chest   Urinalysis Dipstick (Completed)   Sedimentation rate (Completed)   CBC with Differential/Platelet (Completed)   Lipase (Completed)    Other Visit Diagnoses    Need for immunization against influenza       Relevant Orders   Flu Vaccine QUAD High Dose(Fluad) (Completed)   Left upper quadrant abdominal pain       Relevant Orders   Sedimentation rate (Completed)   CBC with Differential/Platelet (Completed)   Lipase (Completed)   CT Abdomen Pelvis Wo Contrast       No orders of the defined types were placed in this encounter.  Orders Placed This Encounter  Procedures  . DG Abd Acute W/Chest    Standing Status:   Future    Number of Occurrences:   1    Standing Expiration Date:   03/06/2021    Order Specific Question:   Reason for Exam (SYMPTOM  OR DIAGNOSIS REQUIRED)    Answer:   L flank pain with pleurisy    Order Specific Question:   Preferred imaging location?    Answer:   [facility]  . CT Abdomen Pelvis Wo Contrast    Urgent but not stat - would like done this week.    Standing Status:   Future    Standing Expiration Date:   03/06/2021    Order Specific Question:   Preferred imaging location?    Answer:   [place]    Order Specific Question:   Is Oral Contrast requested for this exam?    Answer:   Yes, Per Radiology protocol  . Flu Vaccine QUAD High Dose(Fluad)  . Sedimentation rate  . CBC with Differential/Platelet  . Lipase  . Urinalysis Dipstick    Patient Instructions  Flu shot today.  Urinalysis today.  Xray today.  Labs today. Pending results we may check CT scan of abdomen.  May use tylenol ES 500-1000mg  three times daily for pain. If needed, may take 600mg  ibuprofen (advil) up to 3  times a day.  If worsening abdominal pain, please seek urgent care.    Follow up plan: Return if symptoms worsen or fail to improve.  Ria Bush, MD

## 2020-03-06 NOTE — Telephone Encounter (Signed)
Appointment scheduled, patient advised.

## 2020-03-06 NOTE — Patient Instructions (Addendum)
Flu shot today.  Urinalysis today.  Xray today.  Labs today. Pending results we may check CT scan of abdomen.  May use tylenol ES 500-1000mg  three times daily for pain. If needed, may take 600mg  ibuprofen (advil) up to 3 times a day.  If worsening abdominal pain, please seek urgent care.

## 2020-03-06 NOTE — Assessment & Plan Note (Addendum)
Unclear etiology. UA negative pointing against kidney stone (and no urinary symptoms). Acute abd series today without obvious cause. She did have a fall last month - ?hematoma. Check labs today, low threshold to check CT imaging. In interim, discussed tylenol, NSAID dosing PRN pain.

## 2020-03-08 ENCOUNTER — Ambulatory Visit (INDEPENDENT_AMBULATORY_CARE_PROVIDER_SITE_OTHER)
Admission: RE | Admit: 2020-03-08 | Discharge: 2020-03-08 | Disposition: A | Discharge status: Home / Self Care | Payer: Medicare Other | Source: Ambulatory Visit | Attending: Family Medicine | Admitting: Family Medicine

## 2020-03-08 ENCOUNTER — Other Ambulatory Visit: Payer: Self-pay

## 2020-03-08 DIAGNOSIS — R1012 Left upper quadrant pain: Secondary | ICD-10-CM | POA: Diagnosis not present

## 2020-03-08 DIAGNOSIS — I700 Atherosclerosis of aorta: Secondary | ICD-10-CM | POA: Diagnosis not present

## 2020-03-08 DIAGNOSIS — N281 Cyst of kidney, acquired: Secondary | ICD-10-CM | POA: Diagnosis not present

## 2020-03-09 ENCOUNTER — Encounter: Payer: Self-pay | Admitting: Family Medicine

## 2020-03-09 ENCOUNTER — Ambulatory Visit: Payer: Medicare Other | Admitting: Family Medicine

## 2020-03-09 VITALS — BP 104/56 | HR 80 | Temp 96.8°F | Ht 67.9 in | Wt 175.6 lb

## 2020-03-09 DIAGNOSIS — Z7189 Other specified counseling: Secondary | ICD-10-CM

## 2020-03-09 DIAGNOSIS — I700 Atherosclerosis of aorta: Secondary | ICD-10-CM

## 2020-03-09 DIAGNOSIS — R109 Unspecified abdominal pain: Secondary | ICD-10-CM | POA: Diagnosis not present

## 2020-03-09 DIAGNOSIS — Z0000 Encounter for general adult medical examination without abnormal findings: Secondary | ICD-10-CM

## 2020-03-09 MED ORDER — BETAMETHASONE VALERATE 0.1 % EX CREA
TOPICAL_CREAM | CUTANEOUS | 1 refills | Status: DC
Start: 2020-03-09 — End: 2021-02-05

## 2020-03-09 MED ORDER — TIZANIDINE HCL 2 MG PO CAPS: 2.0000 mg | ORAL_CAPSULE | Freq: Three times a day (TID) | ORAL | 0 refills | Status: DC | PRN

## 2020-03-09 MED ORDER — OXYBUTYNIN CHLORIDE ER 5 MG PO TB24
ORAL_TABLET | ORAL | 3 refills | Status: DC
Start: 2020-03-09 — End: 2021-02-05

## 2020-03-09 NOTE — Progress Notes (Signed)
This visit occurred during the SARS-CoV-2 public health emergency.  Safety protocols were in place, including screening questions prior to the visit, additional usage of staff PPE, and extensive cleaning of exam room while observing appropriate contact time as indicated for disinfecting solutions.  Tetanus 2015 Flu 2021 PNA 2021 Shingles d/w pt.   covid vaccine 2021 Colonoscopy 2020 Pap per gyn- she'll f/u with Dr. Gertie Fey.  I'll defer.   Mammogram pending per gyn.   DXA prev done per gyn.  Husband designated if patient were incapacitated.   She was asking if there was other laboratory tests that would evaluate her risk diffusely for cancer.  We talked about routine screening and guidelines but there is no such universal cancer screening.  She is still having some pain.  Mild sed rate elevation d/w pt.  L flank pain, radiating around the L flank and lower ribs. Radiation is better but still focally sore in the L lower back.    Recent CT with no acute process demonstrated in the abdomen or pelvis. No renal or ureteral stone or obstruction. Aortic Atherosclerosis (ICD10-I70.0).  Would defer statin at this point given the recent muscle pain.  Discussed with patient.  She agrees.  PMH and SH reviewed  Meds, vitals, and allergies reviewed.   ROS: Per HPI.  Unless specifically indicated otherwise in HPI, the patient denies:  General: fever. Eyes: acute vision changes ENT: sore throat Cardiovascular: chest pain Respiratory: SOB GI: vomiting GU: dysuria Musculoskeletal: acute back pain Derm: acute rash Neuro: acute motor dysfunction Psych: worsening mood Endocrine: polydipsia Heme: bleeding Allergy: hayfever  GEN: nad, alert and oriented HEENT: ncat NECK: supple w/o LA CV: rrr. PULM: ctab, no inc wob ABD: soft, +bs EXT: no edema SKIN: no acute rash Midline back not tender.  Left flank still slightly tender but no CVA pain.

## 2020-03-09 NOTE — Patient Instructions (Addendum)
I would try heat on your back.  Gently stretch.   Ibuprofen and tylenol for pain.  You can try tizanidine if needed   Take care.  Glad to see you.

## 2020-03-12 ENCOUNTER — Other Ambulatory Visit: Payer: Self-pay | Admitting: Family Medicine

## 2020-03-12 DIAGNOSIS — I700 Atherosclerosis of aorta: Secondary | ICD-10-CM | POA: Insufficient documentation

## 2020-03-12 MED ORDER — TIZANIDINE HCL 2 MG PO TABS: 2.0000 mg | ORAL_TABLET | Freq: Three times a day (TID) | ORAL | 0 refills | Status: DC | PRN

## 2020-03-12 NOTE — Assessment & Plan Note (Signed)
Tetanus 2015 Flu 2021 PNA 2021 Shingles d/w pt.   covid vaccine 2021 Colonoscopy 2020 Pap per gyn- she'll f/u with Dr. Gertie Fey.  I'll defer.   Mammogram pending per gyn.   DXA prev done per gyn.  Husband designated if patient were incapacitated.

## 2020-03-12 NOTE — Assessment & Plan Note (Signed)
Would defer statin at this point given the recent muscle pain.  Discussed with patient.  She agrees.

## 2020-03-12 NOTE — Assessment & Plan Note (Signed)
She is still having some pain but is improved from previous.  Mild sed rate elevation d/w pt.  L flank pain, radiating around the L flank and lower ribs. Radiation is better but still focally sore in the L lower back.    Recent CT with no acute process demonstrated in the abdomen or pelvis. No renal or ureteral stone or obstruction. Aortic Atherosclerosis (ICD10-I70.0).  Likely that she does have muscle pain and I would try gently stretching, heat, ibuprofen, tizanidine as needed with routine cautions.  She will update me as needed.  I expect her to improve.

## 2020-03-12 NOTE — Assessment & Plan Note (Signed)
Husband designated if patient were incapacitated. 

## 2020-03-23 ENCOUNTER — Other Ambulatory Visit: Payer: Self-pay

## 2020-03-23 ENCOUNTER — Ambulatory Visit (INDEPENDENT_AMBULATORY_CARE_PROVIDER_SITE_OTHER): Payer: Medicare Other

## 2020-03-23 DIAGNOSIS — Z23 Encounter for immunization: Secondary | ICD-10-CM

## 2020-09-12 DIAGNOSIS — Z6829 Body mass index (BMI) 29.0-29.9, adult: Secondary | ICD-10-CM | POA: Diagnosis not present

## 2020-09-12 DIAGNOSIS — Z01419 Encounter for gynecological examination (general) (routine) without abnormal findings: Secondary | ICD-10-CM | POA: Diagnosis not present

## 2020-09-12 DIAGNOSIS — Z1231 Encounter for screening mammogram for malignant neoplasm of breast: Secondary | ICD-10-CM | POA: Diagnosis not present

## 2020-09-12 LAB — HM MAMMOGRAPHY

## 2021-01-22 ENCOUNTER — Other Ambulatory Visit: Payer: Self-pay | Admitting: Family Medicine

## 2021-01-22 DIAGNOSIS — E785 Hyperlipidemia, unspecified: Secondary | ICD-10-CM

## 2021-01-29 ENCOUNTER — Other Ambulatory Visit: Payer: Medicare Other

## 2021-02-05 ENCOUNTER — Encounter: Payer: Self-pay | Admitting: Family Medicine

## 2021-02-05 ENCOUNTER — Other Ambulatory Visit: Payer: Self-pay

## 2021-02-05 ENCOUNTER — Ambulatory Visit (INDEPENDENT_AMBULATORY_CARE_PROVIDER_SITE_OTHER): Payer: Medicare Other | Admitting: Family Medicine

## 2021-02-05 VITALS — BP 120/64 | HR 71 | Temp 97.9°F | Ht 68.0 in | Wt 198.0 lb

## 2021-02-05 DIAGNOSIS — Z0000 Encounter for general adult medical examination without abnormal findings: Secondary | ICD-10-CM | POA: Diagnosis not present

## 2021-02-05 DIAGNOSIS — R32 Unspecified urinary incontinence: Secondary | ICD-10-CM

## 2021-02-05 DIAGNOSIS — E785 Hyperlipidemia, unspecified: Secondary | ICD-10-CM

## 2021-02-05 DIAGNOSIS — Z23 Encounter for immunization: Secondary | ICD-10-CM | POA: Diagnosis not present

## 2021-02-05 DIAGNOSIS — Z7189 Other specified counseling: Secondary | ICD-10-CM

## 2021-02-05 LAB — COMPREHENSIVE METABOLIC PANEL
ALT: 9 U/L (ref 0–35)
AST: 13 U/L (ref 0–37)
Albumin: 4 g/dL (ref 3.5–5.2)
Alkaline Phosphatase: 65 U/L (ref 39–117)
BUN: 11 mg/dL (ref 6–23)
CO2: 28 mEq/L (ref 19–32)
Calcium: 9.4 mg/dL (ref 8.4–10.5)
Chloride: 105 mEq/L (ref 96–112)
Creatinine, Ser: 0.87 mg/dL (ref 0.40–1.20)
GFR: 67.63 mL/min (ref >60.00)
Glucose, Bld: 83 mg/dL (ref 70–99)
Potassium: 4.4 mEq/L (ref 3.5–5.1)
Sodium: 139 mEq/L (ref 135–145)
Total Bilirubin: 0.4 mg/dL (ref 0.2–1.2)
Total Protein: 7.7 g/dL (ref 6.0–8.3)

## 2021-02-05 LAB — LIPID PANEL
Cholesterol: 232 mg/dL — ABNORMAL HIGH (ref 0–200)
HDL: 60 mg/dL (ref >39.00)
LDL Cholesterol: 151 mg/dL — ABNORMAL HIGH (ref 0–99)
NonHDL: 171.67
Total CHOL/HDL Ratio: 4
Triglycerides: 103 mg/dL (ref 0.0–149.0)
VLDL: 20.6 mg/dL (ref 0.0–40.0)

## 2021-02-05 MED ORDER — OXYBUTYNIN CHLORIDE ER 5 MG PO TB24: ORAL_TABLET | ORAL | Status: DC

## 2021-02-05 NOTE — Progress Notes (Signed)
This visit occurred during the SARS-CoV-2 public health emergency.  Safety protocols were in place, including screening questions prior to the visit, additional usage of staff PPE, and extensive cleaning of exam room while observing appropriate contact time as indicated for disinfecting solutions.  I have personally reviewed the Medicare Annual Wellness questionnaire and have noted 1. The patient's medical and social history 2. Their use of alcohol, tobacco or illicit drugs 3. Their current medications and supplements 4. The patient's functional ability including ADL's, fall risks, home safety risks and hearing or visual             impairment. 5. Diet and physical activities 6. Evidence for depression or mood disorders  The patients weight, height, BMI have been recorded in the chart and visual acuity is per eye clinic.  I have made referrals, counseling and provided education to the patient based review of the above and I have provided the pt with a written personalized care plan for preventive services.  Provider list updated- see scanned forms.  Routine anticipatory guidance given to patient.  See health maintenance. The possibility exists that previously documented standard health maintenance information may have been brought forward from a previous encounter into this note.  If needed, that same information has been updated to reflect the current situation based on today's encounter.    Flu 2022 Shingles discussed with patient PNA up-to-date Tetanus 2015 COVID-vaccine previously done Colonoscopy 2020 Breast cancer screening 2022 DXA previously done per gynecology. Advance directive-husband designated if patient were incapacitated Cognitive function addressed- see scanned forms- and if abnormal then additional documentation follows.   In addition to [facility], follow up visit for the below conditions:  She didn't see a sig change with oxybutynin.  We talked about LUTS.  She  still has urgency.  She is still doing Kegel exercises.  She has variable amounts of incontinence.  Her sx were worse with mucinex D, d/w pt.  See AVS.  We talked about trial of higher dose of oxybutynin.  No burning with urination.    She noted better exercise tolerate with lower weight and she is going to get back on a healthier diet.  See notes on labs.  PMH and SH reviewed  Meds, vitals, and allergies reviewed.   ROS: Per HPI.  Unless specifically indicated otherwise in HPI, the patient denies:  General: fever. Eyes: acute vision changes ENT: sore throat Cardiovascular: chest pain Respiratory: SOB GI: vomiting GU: dysuria Musculoskeletal: acute back pain Derm: acute rash Neuro: acute motor dysfunction Psych: worsening mood Endocrine: polydipsia Heme: bleeding Allergy: hayfever  GEN: nad, alert and oriented HEENT: ncat NECK: supple w/o LA CV: rrr. PULM: ctab, no inc wob ABD: soft, +bs EXT: trace BLE edema SKIN: Well-perfused.

## 2021-02-05 NOTE — Patient Instructions (Addendum)
Go to the lab on the way out.   If you have mychart we'll likely use that to update you.    Take care.  Glad to see you. Try the higher dose of oxybutynin and if that isn't helping then I would see urology.   Check with your insurance to see if they will cover the shingles shot.

## 2021-02-07 ENCOUNTER — Encounter: Payer: Self-pay | Admitting: Family Medicine

## 2021-02-07 NOTE — Assessment & Plan Note (Signed)
Flu 2022 Shingles discussed with patient PNA up-to-date Tetanus 2015 COVID-vaccine previously done Colonoscopy 2020 Breast cancer screening 2022 DXA previously done per gynecology. Advance directive-husband designated if patient were incapacitated Cognitive function addressed- see scanned forms- and if abnormal then additional documentation follows.

## 2021-02-07 NOTE — Assessment & Plan Note (Signed)
Advance directive- husband designated if patient were incapacitated.  

## 2021-02-07 NOTE — Assessment & Plan Note (Signed)
We talked about trial of higher dose of oxybutynin.  No burning with urination.  She will update me as needed.

## 2021-02-22 ENCOUNTER — Ambulatory Visit: Payer: Medicare Other

## 2021-05-10 ENCOUNTER — Encounter: Payer: Self-pay | Admitting: Emergency Medicine

## 2021-05-10 ENCOUNTER — Other Ambulatory Visit: Payer: Self-pay

## 2021-05-10 ENCOUNTER — Ambulatory Visit
Admission: EM | Admit: 2021-05-10 | Discharge: 2021-05-10 | Disposition: A | Discharge status: Home / Self Care | Payer: Medicare Other | Attending: Internal Medicine | Admitting: Internal Medicine

## 2021-05-10 ENCOUNTER — Ambulatory Visit
Admission: RE | Admit: 2021-05-10 | Discharge: 2021-05-10 | Disposition: A | Discharge status: Home / Self Care | Payer: Medicare Other | Source: Ambulatory Visit | Attending: Internal Medicine | Admitting: Internal Medicine

## 2021-05-10 DIAGNOSIS — R059 Cough, unspecified: Secondary | ICD-10-CM | POA: Diagnosis not present

## 2021-05-10 DIAGNOSIS — J069 Acute upper respiratory infection, unspecified: Secondary | ICD-10-CM | POA: Diagnosis not present

## 2021-05-10 DIAGNOSIS — R0602 Shortness of breath: Secondary | ICD-10-CM | POA: Diagnosis not present

## 2021-05-10 MED ORDER — ALBUTEROL SULFATE HFA 108 (90 BASE) MCG/ACT IN AERS: 1.0000 | INHALATION_SPRAY | Freq: Four times a day (QID) | RESPIRATORY_TRACT | 0 refills | Status: DC | PRN

## 2021-05-10 MED ORDER — BENZONATATE 100 MG PO CAPS: 100.0000 mg | ORAL_CAPSULE | Freq: Three times a day (TID) | ORAL | 0 refills | Status: DC | PRN

## 2021-05-10 MED ORDER — PREDNISONE 20 MG PO TABS: 40.0000 mg | ORAL_TABLET | Freq: Every day | ORAL | 0 refills | Status: AC

## 2021-05-10 NOTE — ED Provider Notes (Signed)
[name]    CSN: [id number] Arrival date & time: 05/10/21  1246      History   Chief Complaint Chief Complaint  Patient presents with   Cough    HPI Jennifer Frank is a 70 y.o. female.   Patient presents with 2-week history of nonproductive, dry cough, nasal drainage, nasal congestion.  Denies any known fevers.  Husband has similar symptoms currently.  Denies chest pain, shortness of breath, nausea, vomiting, diarrhea, abdominal pain, sore throat, ear pain.  Patient has taken Mucinex without any relief of symptoms.  Patient denies any chronic lung conditions such as asthma or COPD.   Cough  Past Medical History:  Diagnosis Date   Arthritis    Clotting disorder ([place])    lower left leg after hip replacement    Fundic gland polyps of stomach, benign    GERD (gastroesophageal reflux disease)    past hx    Hearing loss    IBS (irritable bowel syndrome)    Personal history of colonic polyps - adenoma 09/22/2013   PONV (postoperative nausea and vomiting)    Reflux     Patient Active Problem List   Diagnosis Date Noted   Aortic atherosclerosis ([place]) 03/12/2020   Left flank pain 03/06/2020   Medicare annual wellness visit, subsequent 01/10/2019   Advance care planning 12/14/2017   Urinary incontinence 12/14/2017   Primary localized osteoarthritis of left knee 05/27/2016   Primary osteoarthritis of left knee 05/26/2016   Health care maintenance 04/22/2016   Hearing loss 04/22/2014   Varicose veins of lower extremities with other complications 19/14/7829   Hyperlipidemia 02/16/2007   GERD 02/16/2007    Past Surgical History:  Procedure Laterality Date   HIP ARTHROSCOPY Left 1997   KNEE ARTHROSCOPY Left 1996   KNEE ARTHROSCOPY Right    05-2016 same time as left knee replacement    POLYPECTOMY     TONSILLECTOMY AND ADENOIDECTOMY  1957   TOTAL KNEE ARTHROPLASTY Bilateral 05/27/2016   Procedure: LEFT TOTAL KNEE ARTHROPLASTY WITH RIGHT KNEE  ARTHROSCOPY;  Surgeon: Frederik Pear, MD;  Location: [place];  Service: Orthopedics;  Laterality: Bilateral;   UPPER GASTROINTESTINAL ENDOSCOPY     WISDOM TOOTH EXTRACTION      OB History   No obstetric history on file.      Home Medications    Prior to Admission medications   Medication Sig Start Date End Date Taking? Authorizing Provider  albuterol (VENTOLIN HFA) 108 (90 Base) MCG/ACT inhaler Inhale 1-2 puffs into the lungs every 6 (six) hours as needed for wheezing or shortness of breath. 05/10/21  Yes Maurita Havener, Hildred Alamin E, FNP  benzonatate (TESSALON) 100 MG capsule Take 1 capsule (100 mg total) by mouth every 8 (eight) hours as needed for cough. 05/10/21  Yes Raesean Bartoletti, Hildred Alamin E, FNP  oxybutynin (DITROPAN-XL) 5 MG 24 hr tablet TAKE 2 TABLETS BY MOUTH EVERYDAY AT BEDTIME 02/05/21  Yes Tonia Ghent, MD  predniSONE (DELTASONE) 20 MG tablet Take 2 tablets (40 mg total) by mouth daily for 5 days. 05/10/21 05/15/21 Yes Teodora Medici, FNP    Family History Family History  Problem Relation Age of Onset   Deep vein thrombosis Mother    Heart disease Mother        before age 51   Hyperlipidemia Mother    Hypertension Mother    Other Mother        varicose veins   Heart attack Mother    Peripheral vascular disease Mother  Bleeding Disorder Mother    Colon polyps Mother    Deep vein thrombosis Father    Heart disease Father        before age 53   Hyperlipidemia Father    Hypertension Father    Heart attack Father    Bleeding Disorder Father    AAA (abdominal aortic aneurysm) Father    Colon polyps Father    Cancer Brother    Hyperlipidemia Brother    Colon cancer Brother 36       died 02/06/08   Breast cancer Paternal 34    Colon polyps Brother    Colon polyps Son    Crohn's disease Son    Esophageal cancer Neg Hx    Stomach cancer Neg Hx    Rectal cancer Neg Hx     Social History Social History   Tobacco Use   Smoking status: Never   Smokeless tobacco: Never  Vaping Use    Vaping Use: Never used  Substance Use Topics   Alcohol use: Yes    Alcohol/week: 1.0 standard drink    Types: 1 Glasses of wine per week    Comment: rarely   Drug use: No     Allergies   Acetaminophen-codeine, Darvocet [propoxyphene n-acetaminophen], Morphine and related, and Vioxx [rofecoxib]   Review of Systems Review of Systems Per HPI  Physical Exam Triage Vital Signs ED Triage Vitals [05/10/21 1301]  Enc Vitals Group     BP 126/84     Pulse Rate 89     Resp 20     Temp 98.4 F (36.9 C)     Temp Source Oral     SpO2 92 %     Weight 197 lb 15.6 oz (89.8 kg)     Height 5\' 8"  (1.727 m)     Head Circumference      Peak Flow      Pain Score 0     Pain Loc      Pain Edu?      Excl. in [place]?    No data found.  Updated Vital Signs BP 126/84 (BP Location: Left Arm)    Pulse 89    Temp 98.4 F (36.9 C) (Oral)    Resp 20    Ht 5\' 8"  (1.727 m)    Wt 197 lb 15.6 oz (89.8 kg)    SpO2 97%    BMI 30.10 kg/m   Visual Acuity Right Eye Distance:   Left Eye Distance:   Bilateral Distance:    Right Eye Near:   Left Eye Near:    Bilateral Near:     Physical Exam Constitutional:      General: She is not in acute distress.    Appearance: Normal appearance. She is not toxic-appearing or diaphoretic.  HENT:     Head: Normocephalic and atraumatic.     Right Ear: Tympanic membrane and ear canal normal.     Left Ear: Tympanic membrane and ear canal normal.     Nose: Congestion present.     Mouth/Throat:     Mouth: Mucous membranes are moist.     Pharynx: No posterior oropharyngeal erythema.  Eyes:     Extraocular Movements: Extraocular movements intact.     Conjunctiva/sclera: Conjunctivae normal.     Pupils: Pupils are equal, round, and reactive to light.  Cardiovascular:     Rate and Rhythm: Normal rate and regular rhythm.     Pulses: Normal pulses.     Heart sounds:  Normal heart sounds.  Pulmonary:     Effort: Pulmonary effort is normal. No respiratory distress.      Breath sounds: No stridor. Wheezing present. No rhonchi or rales.  Abdominal:     General: Abdomen is flat. Bowel sounds are normal.     Palpations: Abdomen is soft.  Musculoskeletal:        General: Normal range of motion.     Cervical back: Normal range of motion.  Skin:    General: Skin is warm and dry.  Neurological:     General: No focal deficit present.     Mental Status: She is alert and oriented to person, place, and time. Mental status is at baseline.  Psychiatric:        Mood and Affect: Mood normal.        Behavior: Behavior normal.     UC Treatments / Results  Labs (all labs ordered are listed, but only abnormal results are displayed) Labs Reviewed - No data to display  EKG   Radiology No results found.  Procedures Procedures (including critical care time)  Medications Ordered in UC Medications - No data to display  Initial Impression / Assessment and Plan / UC Course  I have reviewed the triage vital signs and the nursing notes.  Pertinent labs & imaging results that were available during my care of the patient were reviewed by me and considered in my medical decision making (see chart for details).     Patient presents with symptoms likely from a viral upper respiratory infection. Differential includes bacterial pneumonia, sinusitis, allergic rhinitis, COVID-19, flu. Do not suspect underlying cardiopulmonary process. Symptoms seem unlikely related to ACS, CHF or COPD exacerbations, pneumonia, pneumothorax. Patient is nontoxic appearing and not in need of emergent medical intervention.  Do not think that viral testing is necessary given duration of symptoms.  Recommended symptom control with over the counter medications.  Wheezing present on exam.  Will prescribe prednisone x5 days as well as albuterol inhaler.  Benzonatate prescribed to take as needed for cough.  Chest x-ray was ordered at [place].  Do not have x-ray tech in urgent care  today.  Oxygen saturation is normal and no signs of respiratory compromise on exam.  Return if symptoms fail to improve in 1-2 weeks or you develop shortness of breath, chest pain, severe headache. Patient states understanding and is agreeable.  Discharged with PCP followup.  Final Clinical Impressions(s) / UC Diagnoses   Final diagnoses:  Viral upper respiratory tract infection with cough     Discharge Instructions      It appears that you have a viral upper respiratory infection that should resolve in the next few days with symptomatic treatment.  You have been prescribed prednisone steroid, albuterol inhaler, and a cough medication.  A chest x-ray has also been ordered for you to have performed at [place] on [facility].  We will call if there are any abnormalities on x-ray.  Please go to the hospital if symptoms worsen.    ED Prescriptions     Medication Sig Dispense Auth. Provider   albuterol (VENTOLIN HFA) 108 (90 Base) MCG/ACT inhaler Inhale 1-2 puffs into the lungs every 6 (six) hours as needed for wheezing or shortness of breath. 1 each [place], Hildred Alamin E, [place]   predniSONE (DELTASONE) 20 MG tablet Take 2 tablets (40 mg total) by mouth daily for 5 days. 10 tablet [place], [place] E, [place]   benzonatate (TESSALON) 100 MG capsule Take 1  capsule (100 mg total) by mouth every 8 (eight) hours as needed for cough. 21 capsule [place], Michele Rockers, [place]      PDMP not reviewed this encounter.   Teodora Medici, [place] 05/10/21 [phone]

## 2021-05-10 NOTE — Discharge Instructions (Signed)
It appears that you have a viral upper respiratory infection that should resolve in the next few days with symptomatic treatment.  You have been prescribed prednisone steroid, albuterol inhaler, and a cough medication.  A chest x-ray has also been ordered for you to have performed at [place] on [facility].  We will call if there are any abnormalities on x-ray.  Please go to the hospital if symptoms worsen.

## 2021-05-10 NOTE — ED Triage Notes (Signed)
Patient c/o non-productive cough, nasal drainage, sinus problems x 2 weeks.  Patient has taken Mucinex without any relief.  Patient is vaccinated for COVID.

## 2021-05-14 NOTE — Progress Notes (Signed)
Normal. No other changes to therapy at this time.

## 2021-05-25 ENCOUNTER — Ambulatory Visit (INDEPENDENT_AMBULATORY_CARE_PROVIDER_SITE_OTHER)
Admission: RE | Admit: 2021-05-25 | Discharge: 2021-05-25 | Disposition: A | Discharge status: Home / Self Care | Payer: Medicare Other | Source: Ambulatory Visit | Attending: Family Medicine | Admitting: Family Medicine

## 2021-05-25 ENCOUNTER — Ambulatory Visit (INDEPENDENT_AMBULATORY_CARE_PROVIDER_SITE_OTHER): Payer: Medicare Other | Admitting: Family Medicine

## 2021-05-25 ENCOUNTER — Other Ambulatory Visit: Payer: Self-pay

## 2021-05-25 ENCOUNTER — Encounter: Payer: Self-pay | Admitting: Family Medicine

## 2021-05-25 VITALS — BP 110/64 | HR 84 | Temp 97.5°F | Ht 68.0 in | Wt 197.0 lb

## 2021-05-25 DIAGNOSIS — R0602 Shortness of breath: Secondary | ICD-10-CM | POA: Diagnosis not present

## 2021-05-25 DIAGNOSIS — R059 Cough, unspecified: Secondary | ICD-10-CM | POA: Diagnosis not present

## 2021-05-25 LAB — BASIC METABOLIC PANEL
BUN: 13 mg/dL (ref 6–23)
CO2: 29 mEq/L (ref 19–32)
Calcium: 9.5 mg/dL (ref 8.4–10.5)
Chloride: 104 mEq/L (ref 96–112)
Creatinine, Ser: 0.87 mg/dL (ref 0.40–1.20)
GFR: 67.49 mL/min (ref >60.00)
Glucose, Bld: 141 mg/dL — ABNORMAL HIGH (ref 70–99)
Potassium: 4.2 mEq/L (ref 3.5–5.1)
Sodium: 139 mEq/L (ref 135–145)

## 2021-05-25 LAB — CBC WITH DIFFERENTIAL/PLATELET
Basophils Absolute: 0 10*3/uL (ref 0.0–0.1)
Basophils Relative: 0.4 % (ref 0.0–3.0)
Eosinophils Absolute: 0.3 10*3/uL (ref 0.0–0.7)
Eosinophils Relative: 3.8 % (ref 0.0–5.0)
HCT: 38.2 % (ref 36.0–46.0)
Hemoglobin: 12.5 g/dL (ref 12.0–15.0)
Lymphocytes Relative: 18.5 % (ref 12.0–46.0)
Lymphs Abs: 1.5 10*3/uL (ref 0.7–4.0)
MCHC: 32.6 g/dL (ref 30.0–36.0)
MCV: 85.7 fl (ref 78.0–100.0)
Monocytes Absolute: 0.6 10*3/uL (ref 0.1–1.0)
Monocytes Relative: 7.2 % (ref 3.0–12.0)
Neutro Abs: 5.7 10*3/uL (ref 1.4–7.7)
Neutrophils Relative %: 70.1 % (ref 43.0–77.0)
Platelets: 214 10*3/uL (ref 150.0–400.0)
RBC: 4.46 Mil/uL (ref 3.87–5.11)
RDW: 14 % (ref 11.5–15.5)
WBC: 8.1 10*3/uL (ref 4.0–10.5)

## 2021-05-25 LAB — TSH: TSH: 1.15 u[IU]/mL (ref 0.35–5.50)

## 2021-05-25 LAB — BRAIN NATRIURETIC PEPTIDE: Pro B Natriuretic peptide (BNP): 22 pg/mL (ref 0.0–100.0)

## 2021-05-25 MED ORDER — ALBUTEROL SULFATE HFA 108 (90 BASE) MCG/ACT IN AERS: 1.0000 | INHALATION_SPRAY | Freq: Four times a day (QID) | RESPIRATORY_TRACT | 1 refills | Status: DC | PRN

## 2021-05-25 NOTE — Patient Instructions (Addendum)
Go to the lab on the way out.   If you have mychart we'll likely use that to update you.    Take care.  Glad to see you.  Try using 1 puff of albuterol instead of 2 and see if that helps without causing jitteriness.

## 2021-05-25 NOTE — Progress Notes (Signed)
This visit occurred during the SARS-CoV-2 public health emergency.  Safety protocols were in place, including screening questions prior to the visit, additional usage of staff PPE, and extensive cleaning of exam room while observing appropriate contact time as indicated for disinfecting solutions.  She had an illness between Thanksgiving and early December.  She had a fever at the time, followed by sweats when the fever broke.  She had neg covid test at the time.  She had a dry cough, no sputum.    She was seen at [facility] 05/10/21.  CXR neg at the time. She had crackles in her chest that she could hear but still didn't have sputum.  Cough is still dry except for rare sputum.  Her SOB is slowly getting better.  She has used SABA prev, it helped with breathing but it makes her jittery.  She prev took prednisone after UC eval.  No CP.  She can get SOB with exertion, more than her prev baseline.  She hasn't noted skipping beats or tachycardia.  No heartburn.  See avs.    Meds, vitals, and allergies reviewed.   ROS: Per HPI unless specifically indicated in ROS section   GEN: nad, alert and oriented HEENT: ncat, TM wnl NECK: supple w/o LA, no stridor CV: rrr PULM: ctab, no inc wob, no wheeze.   ABD: soft, +bs EXT: trace BLE edema SKIN: well perfused.    30 minutes were devoted to patient care in this encounter (this includes time spent reviewing the patient's file/history, interviewing and examining the patient, counseling/reviewing plan with patient).

## 2021-05-27 DIAGNOSIS — R0602 Shortness of breath: Secondary | ICD-10-CM | POA: Insufficient documentation

## 2021-05-27 NOTE — Assessment & Plan Note (Signed)
Unclear source.  This could be a postinfectious issue that should gradually resolve.  Discussed using albuterol as needed but only 1 puff at a time to try to limit any jitteriness on that medication.  We will check routine labs today along with chest x-ray.  See notes on labs.  Still okay for outpatient follow-up.

## 2021-06-16 ENCOUNTER — Encounter: Payer: Self-pay | Admitting: Radiology

## 2021-06-16 ENCOUNTER — Emergency Department: Payer: Medicare Other

## 2021-06-16 DIAGNOSIS — R110 Nausea: Secondary | ICD-10-CM | POA: Insufficient documentation

## 2021-06-16 DIAGNOSIS — R1110 Vomiting, unspecified: Secondary | ICD-10-CM | POA: Diagnosis not present

## 2021-06-16 DIAGNOSIS — R109 Unspecified abdominal pain: Secondary | ICD-10-CM | POA: Insufficient documentation

## 2021-06-16 DIAGNOSIS — R197 Diarrhea, unspecified: Secondary | ICD-10-CM | POA: Insufficient documentation

## 2021-06-16 DIAGNOSIS — R42 Dizziness and giddiness: Secondary | ICD-10-CM | POA: Insufficient documentation

## 2021-06-16 DIAGNOSIS — R531 Weakness: Secondary | ICD-10-CM | POA: Diagnosis not present

## 2021-06-16 DIAGNOSIS — R112 Nausea with vomiting, unspecified: Secondary | ICD-10-CM | POA: Diagnosis not present

## 2021-06-16 DIAGNOSIS — Z96653 Presence of artificial knee joint, bilateral: Secondary | ICD-10-CM | POA: Diagnosis not present

## 2021-06-16 DIAGNOSIS — D72829 Elevated white blood cell count, unspecified: Secondary | ICD-10-CM | POA: Insufficient documentation

## 2021-06-16 DIAGNOSIS — I700 Atherosclerosis of aorta: Secondary | ICD-10-CM | POA: Diagnosis not present

## 2021-06-16 DIAGNOSIS — Z20822 Contact with and (suspected) exposure to covid-19: Secondary | ICD-10-CM | POA: Insufficient documentation

## 2021-06-16 LAB — RESP PANEL BY RT-PCR (FLU A&B, COVID) ARPGX2
Influenza A by PCR: NEGATIVE
Influenza B by PCR: NEGATIVE
SARS Coronavirus 2 by RT PCR: NEGATIVE

## 2021-06-16 LAB — CBC WITH DIFFERENTIAL/PLATELET
Abs Immature Granulocytes: 0.07 10*3/uL (ref 0.00–0.07)
Basophils Absolute: 0 10*3/uL (ref 0.0–0.1)
Basophils Relative: 0 %
Eosinophils Absolute: 0 10*3/uL (ref 0.0–0.5)
Eosinophils Relative: 0 %
HCT: 40.8 % (ref 36.0–46.0)
Hemoglobin: 13 g/dL (ref 12.0–15.0)
Immature Granulocytes: 0 %
Lymphocytes Relative: 9 %
Lymphs Abs: 1.4 10*3/uL (ref 0.7–4.0)
MCH: 28.6 pg (ref 26.0–34.0)
MCHC: 31.9 g/dL (ref 30.0–36.0)
MCV: 89.7 fL (ref 80.0–100.0)
Monocytes Absolute: 1 10*3/uL (ref 0.1–1.0)
Monocytes Relative: 6 %
Neutro Abs: 13.3 10*3/uL — ABNORMAL HIGH (ref 1.7–7.7)
Neutrophils Relative %: 85 %
Platelets: 247 10*3/uL (ref 150–400)
RBC: 4.55 MIL/uL (ref 3.87–5.11)
RDW: 13.1 % (ref 11.5–15.5)
WBC: 15.8 10*3/uL — ABNORMAL HIGH (ref 4.0–10.5)
nRBC: 0 % (ref 0.0–0.2)

## 2021-06-16 LAB — BASIC METABOLIC PANEL
Anion gap: 9 (ref 5–15)
BUN: 13 mg/dL (ref 8–23)
CO2: 23 mmol/L (ref 22–32)
Calcium: 9.4 mg/dL (ref 8.9–10.3)
Chloride: 100 mmol/L (ref 98–111)
Creatinine, Ser: 0.86 mg/dL (ref 0.44–1.00)
GFR, Estimated: >60 mL/min (ref >60)
Glucose, Bld: 114 mg/dL — ABNORMAL HIGH (ref 70–99)
Potassium: 4.2 mmol/L (ref 3.5–5.1)
Sodium: 132 mmol/L — ABNORMAL LOW (ref 135–145)

## 2021-06-16 LAB — TROPONIN I (HIGH SENSITIVITY): Troponin I (High Sensitivity): 4 ng/L (ref <18)

## 2021-06-16 LAB — LACTIC ACID, PLASMA: Lactic Acid, Venous: 1.2 mmol/L (ref 0.5–1.9)

## 2021-06-16 NOTE — ED Triage Notes (Signed)
Pt stood up this morning and became dizzy/weak/ and nauseated. Generalized weakness. Denies vomiting and diarrhea.

## 2021-06-17 ENCOUNTER — Emergency Department
Admission: EM | Admit: 2021-06-17 | Discharge: 2021-06-17 | Disposition: A | Discharge status: Home / Self Care | Payer: Medicare Other | Attending: Emergency Medicine | Admitting: Emergency Medicine

## 2021-06-17 ENCOUNTER — Emergency Department: Payer: Medicare Other

## 2021-06-17 DIAGNOSIS — R110 Nausea: Secondary | ICD-10-CM

## 2021-06-17 DIAGNOSIS — D72829 Elevated white blood cell count, unspecified: Secondary | ICD-10-CM

## 2021-06-17 DIAGNOSIS — R109 Unspecified abdominal pain: Secondary | ICD-10-CM | POA: Diagnosis not present

## 2021-06-17 DIAGNOSIS — I700 Atherosclerosis of aorta: Secondary | ICD-10-CM | POA: Diagnosis not present

## 2021-06-17 DIAGNOSIS — R197 Diarrhea, unspecified: Secondary | ICD-10-CM

## 2021-06-17 DIAGNOSIS — R531 Weakness: Secondary | ICD-10-CM

## 2021-06-17 LAB — URINALYSIS, COMPLETE (UACMP) WITH MICROSCOPIC
Bacteria, UA: NONE SEEN
Bilirubin Urine: NEGATIVE
Glucose, UA: NEGATIVE mg/dL
Ketones, ur: NEGATIVE mg/dL
Leukocytes,Ua: NEGATIVE
Nitrite: NEGATIVE
Specific Gravity, Urine: >1.03 — ABNORMAL HIGH (ref 1.005–1.030)
pH: 5 (ref 5.0–8.0)

## 2021-06-17 LAB — PROCALCITONIN: Procalcitonin: <0.1 ng/mL

## 2021-06-17 MED ORDER — IOHEXOL 300 MG/ML  SOLN
100.0000 mL | Freq: Once | INTRAMUSCULAR | Status: AC | PRN
  Administered 2021-06-17: 100 mL via INTRAVENOUS
  Filled 2021-06-17: qty 100

## 2021-06-17 MED ORDER — ONDANSETRON HCL 4 MG/2ML IJ SOLN
4.0000 mg | Freq: Once | INTRAMUSCULAR | Status: AC
  Administered 2021-06-17: 4 mg via INTRAVENOUS
  Filled 2021-06-17: qty 2

## 2021-06-17 MED ORDER — ONDANSETRON 4 MG PO TBDP: 4.0000 mg | ORAL_TABLET | Freq: Four times a day (QID) | ORAL | 0 refills | Status: DC | PRN

## 2021-06-17 MED ORDER — SODIUM CHLORIDE 0.9 % IV BOLUS (SEPSIS)
500.0000 mL | Freq: Once | INTRAVENOUS | Status: AC
  Administered 2021-06-17: 500 mL via INTRAVENOUS

## 2021-06-17 NOTE — ED Notes (Signed)
Pt tolerated po fluids  

## 2021-06-17 NOTE — ED Notes (Signed)
Assessment: pt states she has been dizzy for several days. Tp states she feels weak, has had nausea and vomiting. Pt states she is wondering if she has a UTI, has urinary incontinence. Pt with pwd skin, no cough noted, resps unalbored. Pt states her knees feel shaky.

## 2021-06-17 NOTE — ED Notes (Signed)
Pt ambulatory without difficulty, reports feeling improved after iv fluids, pt urinating in restroom at this time, will po challenge on arrival back to room.

## 2021-06-17 NOTE — ED Notes (Signed)
Pt cleansed of large amount of semisoft brown stool.

## 2021-06-17 NOTE — Discharge Instructions (Addendum)
Your work-up today has been reassuring other than a slightly elevated white blood cell count which may be due to a viral illness.  You have no sign of any bacterial infection today.  I suspect that you have a viral gastroenteritis causing your chills, nausea and diarrhea.  CT of your abdomen pelvis was normal.  You may use over-the-counter Imodium as needed for diarrhea.  You may use over-the-counter Tylenol 1000 mg every 6 hours as needed for fever, pain.  I recommend a bland diet and increase fluid intake over the next several days.

## 2021-06-17 NOTE — ED Notes (Signed)
Patient transported to CT 

## 2021-06-17 NOTE — ED Provider Notes (Signed)
[facility] Provider Note    Event Date/Time   First MD Initiated Contact with Patient 06/17/21 0014     (approximate)   History   Weakness, Nausea, and Dizziness   HPI  Jennifer Frank is a 71 y.o. female with history of GERD, IBS who presents to the emergency department with not feeling well today.  States she woke up this morning and felt very "swimmy headed".  She states this has improved but now she feels very weak.  She states she has had chills today but no documented fever.  She denies any chest pain, shortness of breath, cough, vomiting, diarrhea, dysuria or hematuria.   History provided by patient and husband.    Past Medical History:  Diagnosis Date   Arthritis    Clotting disorder ([place])    lower left leg after hip replacement    Fundic gland polyps of stomach, benign    GERD (gastroesophageal reflux disease)    past hx    Hearing loss    IBS (irritable bowel syndrome)    Personal history of colonic polyps - adenoma 09/22/2013   PONV (postoperative nausea and vomiting)    Reflux     Past Surgical History:  Procedure Laterality Date   HIP ARTHROSCOPY Left 1997   KNEE ARTHROSCOPY Left 1996   KNEE ARTHROSCOPY Right    05-2016 same time as left knee replacement    POLYPECTOMY     TONSILLECTOMY AND ADENOIDECTOMY  1957   TOTAL KNEE ARTHROPLASTY Bilateral 05/27/2016   Procedure: LEFT TOTAL KNEE ARTHROPLASTY WITH RIGHT KNEE ARTHROSCOPY;  Surgeon: Frederik Pear, MD;  Location: [place];  Service: Orthopedics;  Laterality: Bilateral;   UPPER GASTROINTESTINAL ENDOSCOPY     WISDOM TOOTH EXTRACTION      MEDICATIONS:  Prior to Admission medications   Medication Sig Start Date End Date Taking? Authorizing Provider  albuterol (VENTOLIN HFA) 108 (90 Base) MCG/ACT inhaler Inhale 1 puff into the lungs every 6 (six) hours as needed (for cough). 05/25/21   Tonia Ghent, MD  oxybutynin (DITROPAN-XL) 5 MG 24 hr tablet TAKE 2 TABLETS BY MOUTH EVERYDAY  AT BEDTIME 02/05/21   Tonia Ghent, MD    Physical Exam   Triage Vital Signs: ED Triage Vitals  Enc Vitals Group     BP 06/16/21 1959 133/74     Pulse Rate 06/16/21 1959 94     Resp 06/16/21 1959 16     Temp 06/16/21 1959 99.3 F (37.4 C)     Temp Source 06/16/21 1959 Oral     SpO2 06/16/21 1959 95 %     Weight 06/16/21 1959 192 lb (87.1 kg)     Height 06/16/21 1959 5\' 10"  (1.778 m)     Head Circumference --      Peak Flow --      Pain Score 06/16/21 2006 0     Pain Loc --      Pain Edu? --      Excl. in [place]? --     Most recent vital signs: Vitals:   06/17/21 0300 06/17/21 0330  BP: 119/66 116/60  Pulse: 74 73  Resp:    Temp:    SpO2: 96% 96%    CONSTITUTIONAL: Alert and oriented and responds appropriately to questions. Well-appearing; well-nourished HEAD: Normocephalic, atraumatic EYES: Conjunctivae clear, pupils appear equal, sclera nonicteric ENT: normal nose; moist mucous membranes NECK: Supple, normal ROM CARD: RRR; S1 and S2 appreciated; no murmurs, no clicks, no  rubs, no gallops RESP: Normal chest excursion without splinting or tachypnea; breath sounds clear and equal bilaterally; no wheezes, no rhonchi, no rales, no hypoxia or respiratory distress, speaking full sentences ABD/GI: Normal bowel sounds; non-distended; soft, non-tender, no rebound, no guarding, no peritoneal signs BACK: The back appears normal EXT: Normal ROM in all joints; no deformity noted, no edema; no cyanosis SKIN: Normal color for age and race; warm; no rash on exposed skin NEURO: Moves all extremities equally, normal speech, no facial asymmetry PSYCH: The patient's mood and manner are appropriate.   ED Results / Procedures / Treatments   LABS: (all labs ordered are listed, but only abnormal results are displayed) Labs Reviewed  BASIC METABOLIC PANEL - Abnormal; Notable for the following components:      Result Value   Sodium 132 (*)    Glucose, Bld 114 (*)    All other  components within normal limits  CBC WITH DIFFERENTIAL/PLATELET - Abnormal; Notable for the following components:   WBC 15.8 (*)    Neutro Abs 13.3 (*)    All other components within normal limits  URINALYSIS, COMPLETE (UACMP) WITH MICROSCOPIC - Abnormal; Notable for the following components:   Specific Gravity, Urine >1.030 (*)    Hgb urine dipstick SMALL (*)    Protein, ur TRACE (*)    All other components within normal limits  RESP PANEL BY RT-PCR (FLU A&B, COVID) ARPGX2  URINE CULTURE  CULTURE, BLOOD (ROUTINE X 2)  CULTURE, BLOOD (ROUTINE X 2)  LACTIC ACID, PLASMA  PROCALCITONIN  TROPONIN I (HIGH SENSITIVITY)     EKG:  EKG Interpretation  Date/Time:  Saturday June 16 2021 20:06:46 EST Ventricular Rate:  93 PR Interval:  138 QRS Duration: 86 QT Interval:  346 QTC Calculation: 430 R Axis:   -16 Text Interpretation: Normal sinus rhythm Inferior infarct , age undetermined Possible Anterior infarct , age undetermined Abnormal ECG When compared with ECG of 28-Jan-2016 00:49, Borderline criteria for Anterior infarct are now Present Inferior infarct is now Present Confirmed by Pryor Curia [phone]) on 06/17/2021 12:16:42 AM         RADIOLOGY: My personal review and interpretation of imaging: Chest x-ray clear.  CT of the abdomen pelvis shows no acute abnormality.  I have personally reviewed all radiology reports.   DG Chest 2 View  Result Date: 06/16/2021 CLINICAL DATA:  Dizziness.  Weakness.  Vomiting. EXAM: CHEST - 2 VIEW COMPARISON:  05/25/2021 FINDINGS: The heart size and mediastinal contours are within normal limits. Both lungs are clear. The visualized skeletal structures are unremarkable. IMPRESSION: No active cardiopulmonary disease. Electronically Signed   By: Marlaine Hind M.D.   On: 06/16/2021 21:33   CT ABDOMEN PELVIS W CONTRAST  Result Date: 06/17/2021 CLINICAL DATA:  Abdominal pain with dizziness, initial encounter EXAM: CT ABDOMEN AND PELVIS WITH CONTRAST  TECHNIQUE: Multidetector CT imaging of the abdomen and pelvis was performed using the standard protocol following bolus administration of intravenous contrast. RADIATION DOSE REDUCTION: This exam was performed according to the departmental dose-optimization program which includes automated exposure control, adjustment of the mA and/or kV according to patient size and/or use of iterative reconstruction technique. CONTRAST:  129mL OMNIPAQUE IOHEXOL 300 MG/ML  SOLN COMPARISON:  03/08/2020 FINDINGS: Lower chest: No acute abnormality. Hepatobiliary: No focal liver abnormality is seen. No gallstones, gallbladder wall thickening, or biliary dilatation. Pancreas: Unremarkable. No pancreatic ductal dilatation or surrounding inflammatory changes. Spleen: Normal in size without focal abnormality. Adrenals/Urinary Tract: Adrenal glands are within normal limits.  Kidneys demonstrate a normal enhancement pattern bilaterally. Mild peripelvic cysts are noted bilaterally. No renal calculi or obstructive changes are seen. The bladder is well distended. Stomach/Bowel: The appendix is within normal limits. No obstructive or inflammatory changes of colon are seen. Small bowel is within normal limits. Stomach shows a small hiatal hernia. Vascular/Lymphatic: Aortic atherosclerosis. No enlarged abdominal or pelvic lymph nodes. Reproductive: Uterus and bilateral adnexa are unremarkable. Other: No abdominal wall hernia or abnormality. No abdominopelvic ascites. Musculoskeletal: Bilateral hip replacements are noted. Mild degenerative changes of lumbar spine are seen. IMPRESSION: No acute abnormality to correspond with the patient's given clinical history is noted. Electronically Signed   By: Inez Catalina M.D.   On: 06/17/2021 03:47     PROCEDURES:  Critical Care performed: No    Procedures    IMPRESSION / MDM / ASSESSMENT AND PLAN / ED COURSE  I reviewed the triage vital signs and the nursing notes.    Patient here with  complaints of lightheadedness/vertigo and generalized weakness.  Also reports chills today.  Oral temperature of 99.3 here.  Labs obtained from triage show leukocytosis of 15,000 with left shift.  The patient is on the cardiac monitor to evaluate for evidence of arrhythmia and/or significant heart rate changes.   DIFFERENTIAL DIAGNOSIS (includes but not limited to):   Viral illness, dehydration, peripheral vertigo, CVA, ACS, arrhythmia, dehydration, anemia, electrolyte derangement, UTI, bacteremia, pneumonia, COVID, influenza   PLAN: We will obtain labs, urine, urine culture, blood cultures, chest x-ray, EKG.  Will give IV fluids.  Will obtain rectal temperature.  Patient may need admission for generalized weakness as nurse reports she was not able to ambulate from the wheelchair to the bed on her own and normally is able to walk without assistance.   MEDICATIONS GIVEN IN ED: Medications  sodium chloride 0.9 % bolus 500 mL (0 mLs Intravenous Stopped 06/17/21 0310)  sodium chloride 0.9 % bolus 500 mL (0 mLs Intravenous Stopped 06/17/21 0400)  ondansetron (ZOFRAN) injection 4 mg (4 mg Intravenous Given 06/17/21 0400)  iohexol (OMNIPAQUE) 300 MG/ML solution 100 mL (100 mLs Intravenous Contrast Given 06/17/21 0334)     ED COURSE: Patient now having increasing nausea and now having diarrhea.  Denies any abdominal pain.  No sick contacts, recent travel, antibiotic use or hospitalization.  Given her age, leukocytosis, will obtain CT of the abdomen pelvis as differential includes cholecystitis, appendicitis, bowel obstruction, colitis, diverticulitis.     4:15 AM  Pt's CT scan reviewed by myself and radiologist and shows no acute abnormality.  Patient reports feeling better and is able to ambulate without difficulty.  Will p.o. challenge.  We did discuss possibility of admission but patient would prefer discharge home with her husband.  She has a PCP for close follow-up.  I suspect that she has a viral  gastroenteritis causing her symptoms.  4:34 AM  Pt tolerating p.o.  Will discharge home.   At this time, I do not feel there is any life-threatening condition present. I reviewed all nursing notes, vitals, pertinent previous records.  All lab and urine results, EKGs, imaging ordered have been independently reviewed and interpreted by myself.  I reviewed all available radiology reports from any imaging ordered this visit.  Based on my assessment, I feel the patient is safe to be discharged home without further emergent workup and can continue workup as an outpatient as needed. Discussed all findings, treatment plan as well as usual and customary return precautions with patient.  They verbalize  understanding and are comfortable with this plan.  Outpatient follow-up has been provided as needed.  All questions have been answered.    CONSULTS: No admission required at this time given patient is feeling better, ambulatory and tolerating p.o. with negative work-up.   OUTSIDE RECORDS REVIEWED: Reviewed patient's most recent primary care note on 05/10/2021.  Patient was seen at that time for symptoms of a viral URI and was given albuterol, prednisone, Tessalon Perles.  Was not on antibiotics.         FINAL CLINICAL IMPRESSION(S) / ED DIAGNOSES   Final diagnoses:  Generalized weakness  Leukocytosis, unspecified type  Diarrhea, unspecified type  Nausea     Rx / DC Orders   ED Discharge Orders          Ordered    ondansetron (ZOFRAN-ODT) 4 MG disintegrating tablet  Every 6 hours PRN        06/17/21 0416             Note:  This document was prepared using Dragon voice recognition software and may include unintentional dictation errors.   Meredith Kilbride, Delice Bison, DO 06/17/21 [phone]

## 2021-06-18 LAB — URINE CULTURE

## 2021-06-22 LAB — CULTURE, BLOOD (ROUTINE X 2)
Culture: NO GROWTH
Culture: NO GROWTH

## 2021-10-19 ENCOUNTER — Encounter: Payer: Self-pay | Admitting: Internal Medicine

## 2021-12-18 ENCOUNTER — Ambulatory Visit: Payer: Medicare Other | Admitting: Internal Medicine

## 2022-01-02 ENCOUNTER — Other Ambulatory Visit: Payer: Self-pay | Admitting: *Deleted

## 2022-01-02 NOTE — Patient Outreach (Signed)
  Care Coordination   Initial Visit Note   01/02/2022 Name: Jennifer Frank MRN: [id number] DOB: [birth year]  ERNEST Frank is a 71 y.o. year old female who sees Jennifer Ghent, MD for primary care. I spoke with  Jennifer Frank by phone today  RN discussed services [facility] services, RN, SW, and Pharmacist. Patient declined services.    Goals Addressed   None     SDOH assessments and interventions completed:  Yes     Care Coordination Interventions Activated:  No  Care Coordination Interventions:  No, not indicated   Follow up plan: No further intervention required.   Encounter Outcome:  Pt. Visit Completed   [facility] [phone]

## 2022-01-02 NOTE — Patient Outreach (Signed)
  Care Coordination   01/02/2022 Name: Jennifer Frank MRN: [id number] DOB: 08/19/1950   Care Coordination Outreach Attempts:  An unsuccessful telephone outreach was attempted today to offer the patient information about available care coordination services as a benefit of their health plan.   Follow Up Plan:  Additional outreach attempts will be made to offer the patient care coordination information and services.   Encounter Outcome:  No Answer  Care Coordination Interventions Activated:  Yes   Care Coordination Interventions:  No, not indicated    [facility] [phone]

## 2022-01-24 DIAGNOSIS — Z6829 Body mass index (BMI) 29.0-29.9, adult: Secondary | ICD-10-CM | POA: Diagnosis not present

## 2022-01-24 DIAGNOSIS — Z01419 Encounter for gynecological examination (general) (routine) without abnormal findings: Secondary | ICD-10-CM | POA: Diagnosis not present

## 2022-01-24 DIAGNOSIS — Z1231 Encounter for screening mammogram for malignant neoplasm of breast: Secondary | ICD-10-CM | POA: Diagnosis not present

## 2022-01-24 DIAGNOSIS — M8588 Other specified disorders of bone density and structure, other site: Secondary | ICD-10-CM | POA: Diagnosis not present

## 2022-01-24 LAB — HM MAMMOGRAPHY

## 2022-01-24 LAB — HM DEXA SCAN

## 2022-02-07 ENCOUNTER — Ambulatory Visit (INDEPENDENT_AMBULATORY_CARE_PROVIDER_SITE_OTHER): Payer: Medicare Other | Admitting: Family Medicine

## 2022-02-07 ENCOUNTER — Encounter: Payer: Self-pay | Admitting: Family Medicine

## 2022-02-07 VITALS — BP 110/64 | HR 75 | Temp 98.0°F | Ht 70.0 in | Wt 198.0 lb

## 2022-02-07 DIAGNOSIS — Z7189 Other specified counseling: Secondary | ICD-10-CM

## 2022-02-07 DIAGNOSIS — M8580 Other specified disorders of bone density and structure, unspecified site: Secondary | ICD-10-CM

## 2022-02-07 DIAGNOSIS — E785 Hyperlipidemia, unspecified: Secondary | ICD-10-CM

## 2022-02-07 DIAGNOSIS — R21 Rash and other nonspecific skin eruption: Secondary | ICD-10-CM

## 2022-02-07 DIAGNOSIS — Z0000 Encounter for general adult medical examination without abnormal findings: Secondary | ICD-10-CM | POA: Diagnosis not present

## 2022-02-07 DIAGNOSIS — K219 Gastro-esophageal reflux disease without esophagitis: Secondary | ICD-10-CM | POA: Diagnosis not present

## 2022-02-07 DIAGNOSIS — R32 Unspecified urinary incontinence: Secondary | ICD-10-CM

## 2022-02-07 LAB — LIPID PANEL
Cholesterol: 245 mg/dL — ABNORMAL HIGH (ref 0–200)
HDL: 66.2 mg/dL (ref >39.00)
LDL Cholesterol: 160 mg/dL — ABNORMAL HIGH (ref 0–99)
NonHDL: 178.85
Total CHOL/HDL Ratio: 4
Triglycerides: 94 mg/dL (ref 0.0–149.0)
VLDL: 18.8 mg/dL (ref 0.0–40.0)

## 2022-02-07 LAB — COMPREHENSIVE METABOLIC PANEL
ALT: 10 U/L (ref 0–35)
AST: 12 U/L (ref 0–37)
Albumin: 3.9 g/dL (ref 3.5–5.2)
Alkaline Phosphatase: 86 U/L (ref 39–117)
BUN: 12 mg/dL (ref 6–23)
CO2: 27 mEq/L (ref 19–32)
Calcium: 9.3 mg/dL (ref 8.4–10.5)
Chloride: 103 mEq/L (ref 96–112)
Creatinine, Ser: 0.89 mg/dL (ref 0.40–1.20)
GFR: 65.35 mL/min (ref >60.00)
Glucose, Bld: 94 mg/dL (ref 70–99)
Potassium: 4 mEq/L (ref 3.5–5.1)
Sodium: 139 mEq/L (ref 135–145)
Total Bilirubin: 0.5 mg/dL (ref 0.2–1.2)
Total Protein: 7.2 g/dL (ref 6.0–8.3)

## 2022-02-07 LAB — TSH: TSH: 1.38 u[IU]/mL (ref 0.35–5.50)

## 2022-02-07 MED ORDER — LORATADINE 10 MG PO TABS: 10.0000 mg | ORAL_TABLET | Freq: Every day | ORAL | Status: DC

## 2022-02-07 MED ORDER — FAMOTIDINE 20 MG PO TABS: 20.0000 mg | ORAL_TABLET | Freq: Every day | ORAL | Status: DC

## 2022-02-07 NOTE — Patient Instructions (Addendum)
If you need a urology referral, then let me know.  Take care.  Glad to see you. Use the cream on your arm and let me know if that isn't helping.  Go to the lab on the way out.   If you have mychart we'll likely use that to update you.     Try claritin '10mg'$  a day for itching.  Pepcid '20mg'$  a day for likely GERD.

## 2022-02-07 NOTE — Progress Notes (Signed)
I have personally reviewed the Medicare Annual Wellness questionnaire and have noted 1. The patient's medical and social history 2. Their use of alcohol, tobacco or illicit drugs 3. Their current medications and supplements 4. The patient's functional ability including ADL's, fall risks, home safety risks and hearing or visual             impairment. 5. Diet and physical activities 6. Evidence for depression or mood disorders  The patients weight, height, BMI have been recorded in the chart and visual acuity is per eye clinic.  I have made referrals, counseling and provided education to the patient based review of the above and I have provided the pt with a written personalized care plan for preventive services.  Provider list updated- see scanned forms.  Routine anticipatory guidance given to patient.  See health maintenance. The possibility exists that previously documented standard health maintenance information may have been brought forward from a previous encounter into this note.  If needed, that same information has been updated to reflect the current situation based on today's encounter.    Flu to be done this fall. Shingles discussed with patient PNA previously done Tetanus 2015 COVID-vaccine previously done Colonoscopy 2020 Breast cancer screening per gynecology 2023. She had DXA per Dr. Gaetano Net 2023.   Advance directive-husband designated if patient were incapacitated. Cognitive function addressed- see scanned forms- and if abnormal then additional documentation follows.   In addition to [facility] Wellness, follow up visit for the below conditions:  Rash.  On the arms.  Itchy.  B forearms but nowhere else.  No lip or tongue swelling.  No clear trigger.    She has occ metallic taste in mouth, presumed GERD, d/w pt.    Recently started on myrbetiq per outside clinic.  That helped with urinary sx.  D/w pt about possible urology f/u.    She had episode of likely low sugar this  summer.  Cautions d/w pt.  She was cutting carbs at the time.  Labs pending.    PMH and SH reviewed  Meds, vitals, and allergies reviewed.   ROS: Per HPI.  Unless specifically indicated otherwise in HPI, the patient denies:  General: fever. Eyes: acute vision changes ENT: sore throat Cardiovascular: chest pain Respiratory: SOB GI: vomiting GU: dysuria Musculoskeletal: acute back pain Derm: acute rash Neuro: acute motor dysfunction Psych: worsening mood Endocrine: polydipsia Heme: bleeding Allergy: hayfever  GEN: nad, alert and oriented HEENT: ncat NECK: supple w/o LA CV: rrr. PULM: ctab, no inc wob ABD: soft, +bs EXT: no edema SKIN: Bilateral forearm blanching rash without ulceration.

## 2022-02-08 LAB — VITAMIN D 25 HYDROXY (VIT D DEFICIENCY, FRACTURES): VITD: 18.39 ng/mL — ABNORMAL LOW (ref 30.00–100.00)

## 2022-02-11 ENCOUNTER — Other Ambulatory Visit: Payer: Self-pay | Admitting: Family Medicine

## 2022-02-11 DIAGNOSIS — E559 Vitamin D deficiency, unspecified: Secondary | ICD-10-CM

## 2022-02-11 DIAGNOSIS — M8580 Other specified disorders of bone density and structure, unspecified site: Secondary | ICD-10-CM | POA: Insufficient documentation

## 2022-02-11 DIAGNOSIS — R21 Rash and other nonspecific skin eruption: Secondary | ICD-10-CM | POA: Insufficient documentation

## 2022-02-11 MED ORDER — VITAMIN D (ERGOCALCIFEROL) 1.25 MG (50000 UNIT) PO CAPS: 50000.0000 [IU] | ORAL_CAPSULE | ORAL | 0 refills | Status: DC

## 2022-02-11 NOTE — Assessment & Plan Note (Signed)
See notes on labs. 

## 2022-02-11 NOTE — Assessment & Plan Note (Signed)
Husband designated if patient were incapacitated. 

## 2022-02-11 NOTE — Assessment & Plan Note (Signed)
Add on Pepcid and update me as needed.

## 2022-02-11 NOTE — Assessment & Plan Note (Signed)
Flu to be done this fall. Shingles discussed with patient PNA previously done Tetanus 2015 COVID-vaccine previously done Colonoscopy 2020 Breast cancer screening per gynecology 2023. She had DXA per Dr. Gaetano Net 2023.   Advance directive-husband designated if patient were incapacitated. Cognitive function addressed- see scanned forms- and if abnormal then additional documentation follows.

## 2022-02-11 NOTE — Assessment & Plan Note (Signed)
Continue Myrbetriq.  She can let me know if she needs a referral.

## 2022-02-21 ENCOUNTER — Other Ambulatory Visit: Payer: Self-pay | Admitting: Family Medicine

## 2022-02-21 DIAGNOSIS — E559 Vitamin D deficiency, unspecified: Secondary | ICD-10-CM

## 2022-02-22 NOTE — Telephone Encounter (Signed)
Pt called returning a missed call. Pt states she take 1 tablet every 7 days, pt stated that's the instructions on the bottle. Call back # [phone]

## 2022-02-22 NOTE — Telephone Encounter (Signed)
Left a message for pt to call and verify how she has been taking her vitamin D prescription. #12 was sent to the pharmacy on 02-11-22. That was to cover 3 months at 1 a week. She should not need a refill. Anyone can ask her this question if she calls back.

## 2022-02-22 NOTE — Telephone Encounter (Signed)
Thank you. I have denied the refills.

## 2022-04-07 ENCOUNTER — Other Ambulatory Visit: Payer: Self-pay

## 2022-04-07 ENCOUNTER — Encounter (HOSPITAL_BASED_OUTPATIENT_CLINIC_OR_DEPARTMENT_OTHER): Payer: Self-pay

## 2022-04-07 ENCOUNTER — Emergency Department (HOSPITAL_BASED_OUTPATIENT_CLINIC_OR_DEPARTMENT_OTHER): Payer: Medicare Other

## 2022-04-07 ENCOUNTER — Emergency Department (HOSPITAL_BASED_OUTPATIENT_CLINIC_OR_DEPARTMENT_OTHER)
Admission: EM | Admit: 2022-04-07 | Discharge: 2022-04-07 | Disposition: A | Discharge status: Home / Self Care | Payer: Medicare Other | Attending: Emergency Medicine | Admitting: Emergency Medicine

## 2022-04-07 DIAGNOSIS — J111 Influenza due to unidentified influenza virus with other respiratory manifestations: Secondary | ICD-10-CM

## 2022-04-07 DIAGNOSIS — J101 Influenza due to other identified influenza virus with other respiratory manifestations: Secondary | ICD-10-CM | POA: Insufficient documentation

## 2022-04-07 DIAGNOSIS — Z1152 Encounter for screening for COVID-19: Secondary | ICD-10-CM | POA: Diagnosis not present

## 2022-04-07 DIAGNOSIS — R059 Cough, unspecified: Secondary | ICD-10-CM | POA: Diagnosis not present

## 2022-04-07 DIAGNOSIS — R197 Diarrhea, unspecified: Secondary | ICD-10-CM | POA: Diagnosis present

## 2022-04-07 LAB — COMPREHENSIVE METABOLIC PANEL
ALT: 10 U/L (ref 0–44)
AST: 13 U/L — ABNORMAL LOW (ref 15–41)
Albumin: 4.1 g/dL (ref 3.5–5.0)
Alkaline Phosphatase: 65 U/L (ref 38–126)
Anion gap: 9 (ref 5–15)
BUN: 11 mg/dL (ref 8–23)
CO2: 22 mmol/L (ref 22–32)
Calcium: 9.4 mg/dL (ref 8.9–10.3)
Chloride: 103 mmol/L (ref 98–111)
Creatinine, Ser: 0.9 mg/dL (ref 0.44–1.00)
GFR, Estimated: >60 mL/min (ref >60)
Glucose, Bld: 113 mg/dL — ABNORMAL HIGH (ref 70–99)
Potassium: 3.8 mmol/L (ref 3.5–5.1)
Sodium: 134 mmol/L — ABNORMAL LOW (ref 135–145)
Total Bilirubin: 0.4 mg/dL (ref 0.3–1.2)
Total Protein: 7.3 g/dL (ref 6.5–8.1)

## 2022-04-07 LAB — CBC
HCT: 37 % (ref 36.0–46.0)
Hemoglobin: 11.9 g/dL — ABNORMAL LOW (ref 12.0–15.0)
MCH: 27.9 pg (ref 26.0–34.0)
MCHC: 32.2 g/dL (ref 30.0–36.0)
MCV: 86.7 fL (ref 80.0–100.0)
Platelets: 179 10*3/uL (ref 150–400)
RBC: 4.27 MIL/uL (ref 3.87–5.11)
RDW: 13.5 % (ref 11.5–15.5)
WBC: 5.9 10*3/uL (ref 4.0–10.5)
nRBC: 0 % (ref 0.0–0.2)

## 2022-04-07 LAB — RESP PANEL BY RT-PCR (FLU A&B, COVID) ARPGX2
Influenza A by PCR: POSITIVE — AB
Influenza B by PCR: NEGATIVE
SARS Coronavirus 2 by RT PCR: NEGATIVE

## 2022-04-07 LAB — LIPASE, BLOOD: Lipase: 25 U/L (ref 11–51)

## 2022-04-07 MED ORDER — ONDANSETRON HCL 4 MG PO TABS: 4.0000 mg | ORAL_TABLET | Freq: Four times a day (QID) | ORAL | 0 refills | Status: DC

## 2022-04-07 MED ORDER — ACETAMINOPHEN 325 MG PO TABS
650.0000 mg | ORAL_TABLET | Freq: Once | ORAL | Status: AC
  Administered 2022-04-07: 650 mg via ORAL
  Filled 2022-04-07: qty 2

## 2022-04-07 MED ORDER — ONDANSETRON 4 MG PO TBDP
4.0000 mg | ORAL_TABLET | Freq: Once | ORAL | Status: AC
  Administered 2022-04-07: 4 mg via ORAL
  Filled 2022-04-07: qty 1

## 2022-04-07 MED ORDER — OSELTAMIVIR PHOSPHATE 75 MG PO CAPS: 75.0000 mg | ORAL_CAPSULE | Freq: Two times a day (BID) | ORAL | 0 refills | Status: DC

## 2022-04-07 NOTE — Discharge Instructions (Signed)
You were seen in the emergency department for your cough, congestion and diarrhea.  Your work-up shows that you have the flu.  You do not appear severely dehydrated on your labs.  You had no signs of pneumonia on your chest x-ray.  I have given you a prescription of Tamiflu which is the antiviral medication for the flu and you should complete this as prescribed.  I have also given you prescription for nausea medication that that you should take as needed.  You can take Tylenol or Motrin as needed for your fevers and body aches.  You should make sure that you are drinking plenty of fluids.  You should follow-up with your primary doctor in the next few days to have your symptoms rechecked.  You should return to the emergency department if you have significantly worsening shortness of breath, you have chest pain, you pass out or if you have any other new or concerning symptoms.

## 2022-04-07 NOTE — ED Notes (Signed)
Pt ambulated in hallway, pt was at 93% RT assisted.

## 2022-04-07 NOTE — ED Provider Notes (Signed)
[facility]ERG[facility] DEPT Provider Note   CSN: [id number] Arrival date & time: 04/07/22  1105     History  Chief Complaint  Patient presents with   Diarrhea    Jennifer Frank is a 71 y.o. female.  Patient is a 71 year old female with a past medical history of hyperlipidemia, IBS and GERD presenting to the emergency department with body aches, congestion and diarrhea.  The patient states that she woke up in the night feeling hot and sweaty with a fever.  She states for the last day she has had a cough and congestion.  She states that she has been nauseous but has not vomited.  She states that she has had diarrhea but denies any black or bloody stools.  She denies any abdominal pain or chest pain or shortness of breath.  She denies any lower extremity swelling.  She denies any known sick contacts.  The history is provided by the patient and a relative.  Diarrhea      Home Medications Prior to Admission medications   Medication Sig Start Date End Date Taking? Authorizing Provider  ondansetron (ZOFRAN) 4 MG tablet Take 1 tablet (4 mg total) by mouth every 6 (six) hours. 04/07/22  Yes Leanord Asal K, DO  oseltamivir (TAMIFLU) 75 MG capsule Take 1 capsule (75 mg total) by mouth every 12 (twelve) hours. 04/07/22  Yes Maylon Peppers, [place] K, DO  betamethasone valerate (VALISONE) 0.1 % cream APPLY A THIN LAYER TO THE AFFECTED AREA(S) BY TOPICAL ROUTE ONCE DAILY AS NEEDED    [provider]  famotidine (PEPCID) 20 MG tablet Take 1 tablet (20 mg total) by mouth daily. 02/07/22   Tonia Ghent, MD  loratadine (CLARITIN) 10 MG tablet Take 1 tablet (10 mg total) by mouth daily. 02/07/22   Tonia Ghent, MD  mirabegron ER (MYRBETRIQ) 25 MG TB24 tablet Take 1 tablet by mouth daily.    [provider]  Vitamin D, Ergocalciferol, (DRISDOL) 1.25 MG (50000 UNIT) CAPS capsule Take 1 capsule (50,000 Units total) by mouth every 7 (seven) days. 02/11/22   Tonia Ghent, MD      Allergies    Codeine, Darvocet [propoxyphene n-acetaminophen], Morphine and related, Oxybutynin, and Vioxx [rofecoxib]    Review of Systems   Review of Systems  Gastrointestinal:  Positive for diarrhea.    Physical Exam Updated Vital Signs BP 106/62 (BP Location: Right Arm)   Pulse 79   Temp 99.9 F (37.7 C) (Oral)   Resp 16   Ht '5\' 10"'$  (1.778 m)   Wt 89.8 kg   SpO2 92%   BMI 28.41 kg/m  Physical Exam Vitals and nursing note reviewed.  Constitutional:      General: She is not in acute distress.    Appearance: She is ill-appearing.  HENT:     Head: Normocephalic and atraumatic.     Nose: Nose normal.     Mouth/Throat:     Mouth: Mucous membranes are moist.     Pharynx: Oropharynx is clear.  Eyes:     Extraocular Movements: Extraocular movements intact.     Conjunctiva/sclera: Conjunctivae normal.  Cardiovascular:     Rate and Rhythm: Normal rate and regular rhythm.     Pulses: Normal pulses.     Heart sounds: Normal heart sounds.  Pulmonary:     Breath sounds: Normal breath sounds.  Abdominal:     General: Abdomen is flat.     Palpations: Abdomen is soft.  Tenderness: There is no abdominal tenderness.  Musculoskeletal:        General: Normal range of motion.     Cervical back: Normal range of motion and neck supple.     Right lower leg: No edema.     Left lower leg: No edema.  Skin:    General: Skin is warm and dry.  Neurological:     General: No focal deficit present.     Mental Status: She is alert and oriented to person, place, and time.  Psychiatric:        Mood and Affect: Mood normal.     ED Results / Procedures / Treatments   Labs (all labs ordered are listed, but only abnormal results are displayed) Labs Reviewed  RESP PANEL BY RT-PCR (FLU A&B, COVID) ARPGX2 - Abnormal; Notable for the following components:      Result Value   Influenza A by PCR POSITIVE (*)    All other components within normal limits  COMPREHENSIVE  METABOLIC PANEL - Abnormal; Notable for the following components:   Sodium 134 (*)    Glucose, Bld 113 (*)    AST 13 (*)    All other components within normal limits  CBC - Abnormal; Notable for the following components:   Hemoglobin 11.9 (*)    All other components within normal limits  LIPASE, BLOOD    EKG None  Radiology DG Chest Port 1 View  Result Date: 04/07/2022 CLINICAL DATA:  Cough EXAM: PORTABLE CHEST 1 VIEW COMPARISON:  06/16/2021 FINDINGS: The heart size and mediastinal contours are within normal limits. Low lung volumes with streaky bibasilar opacities. No pleural effusion or pneumothorax. The visualized skeletal structures are unremarkable. IMPRESSION: Low lung volumes with streaky bibasilar opacities, likely atelectasis. Electronically Signed   By: Davina Poke D.O.   On: 04/07/2022 13:59    Procedures Procedures    Medications Ordered in ED Medications  ondansetron (ZOFRAN-ODT) disintegrating tablet 4 mg (4 mg Oral Given 04/07/22 1340)  acetaminophen (TYLENOL) tablet 650 mg (650 mg Oral Given 04/07/22 1344)    ED Course/ Medical Decision Making/ A&P                           Medical Decision Making This patient presents to the ED with chief complaint(s) of cough, congestion and diarrhea with pertinent past medical history of IBS, GERD, hyperlipidemia which further complicates the presenting complaint. The complaint involves an extensive differential diagnosis and also carries with it a high risk of complications and morbidity.    The differential diagnosis includes viral syndrome, dehydration, pneumonia, pleural effusion, pulmonary edema, intra-abdominal infection less likely as patient has no point abdominal tenderness  Additional history obtained: Additional history obtained from family Records reviewed N/A  ED Course and Reassessment: Patient was initially evaluated by nursing in triage and had labs, viral panel performed.  She tested positive for  flu.  She is ill-appearing but does not appear dehydrated and is no acute respiratory distress.  Due to her cough with fevers, chest x-ray will be performed to evaluate for underlying pneumonia.  No signs of significant dehydration on her labs.  She was given Zofran for her nausea and Tylenol for her low-grade fever and body aches.  Independent labs interpretation:  The following labs were independently interpreted: Flu positive, otherwise within normal range  Independent visualization of imaging: - I independently visualized the following imaging with scope of interpretation limited to determining acute life threatening  conditions related to emergency care: Chest x-ray, which revealed no acute disease  Consultation: - Consulted or discussed management/test interpretation w/ external professional: N/A  Consideration for admission or further workup: Patient has no emergent conditions requiring admission or further work-up at this time and is stable for discharge with primary care follow-up Social Determinants of health: N/A    Amount and/or Complexity of Data Reviewed Labs: ordered. Radiology: ordered.  Risk OTC drugs. Prescription drug management.          Final Clinical Impression(s) / ED Diagnoses Final diagnoses:  Influenza    Rx / DC Orders ED Discharge Orders          Ordered    oseltamivir (TAMIFLU) 75 MG capsule  Every 12 hours        04/07/22 1459    ondansetron (ZOFRAN) 4 MG tablet  Every 6 hours        04/07/22 1459              Kemper Durie, DO 04/07/22 1505

## 2022-04-07 NOTE — ED Notes (Signed)
Dc instructions reviewed with patient. Patient voiced understanding. Dc with belongings.  °

## 2022-04-07 NOTE — ED Triage Notes (Signed)
Patient here POV from Home with Family.  Endorses Not feeling well since this AM with Chills, Nausea, Mild Emesis, Diarrhea, Fatigue, Possible Fever.   No Pain. Mild URI Symptoms Yesterday that subsided but No Sore Throat.   NAD Noted during Triage. A&Ox4. GCS 15. BIB Wheelchair.

## 2022-04-15 ENCOUNTER — Telehealth: Payer: Self-pay

## 2022-04-15 NOTE — Progress Notes (Cosign Needed)
    Chronic Care Management Pharmacy Assistant   Name: Jennifer Frank  MRN: [id number] DOB: [birth year]  Reason for Encounter: Non-CCM (Hosptial Follow Up)  Medications: Outpatient Encounter Medications as of 04/15/2022  Medication Sig   betamethasone valerate (VALISONE) 0.1 % cream APPLY A THIN LAYER TO THE AFFECTED AREA(S) BY TOPICAL ROUTE ONCE DAILY AS NEEDED   famotidine (PEPCID) 20 MG tablet Take 1 tablet (20 mg total) by mouth daily.   loratadine (CLARITIN) 10 MG tablet Take 1 tablet (10 mg total) by mouth daily.   mirabegron ER (MYRBETRIQ) 25 MG TB24 tablet Take 1 tablet by mouth daily.   ondansetron (ZOFRAN) 4 MG tablet Take 1 tablet (4 mg total) by mouth every 6 (six) hours.   oseltamivir (TAMIFLU) 75 MG capsule Take 1 capsule (75 mg total) by mouth every 12 (twelve) hours.   Vitamin D, Ergocalciferol, (DRISDOL) 1.25 MG (50000 UNIT) CAPS capsule Take 1 capsule (50,000 Units total) by mouth every 7 (seven) days.   No facility-administered encounter medications on file as of 04/15/2022.     Reviewed hospital notes for details of recent visit. Has patient been contacted by Transitions of Care team? No Has patient seen PCP/specialist for hospital follow up (summarize OV if yes): No  Admitted to the ED on 04/07/2022. Discharge date was 04/07/2022.  Discharged from [facility].   Discharge diagnosis (Principal Problem): Influenza Patient was discharged to Home  Brief summary of hospital course: Patient was initially evaluated by nursing in triage and had labs, viral panel performed.  She tested positive for flu.  She is ill-appearing but does not appear dehydrated and is no acute respiratory distress.  Due to her cough with fevers, chest x-ray will be performed to evaluate for underlying pneumonia.  No signs of significant dehydration on her labs.  She was given Zofran for her nausea and Tylenol for her low-grade fever and body aches.   Flu positive,  otherwise within normal range   Consideration for admission or further workup: Patient has no emergent conditions requiring admission or further work-up at this time and is stable for discharge with primary care follow-up   New?Medications Started at [facility] Discharge:?? -Started ondansetron (ZOFRAN) 4 MG tablet -Started oseltamivir (TAMIFLU) 75 MG capsule   Medication Changes at Hospital Discharge: None noted  Medications Discontinued at Hospital Discharge: None noted  Medications that remain the same after Hospital Discharge:??  -All other medications will remain the same.    Next CCM appt: Non-CCM  Other upcoming appts: PCP appointment on 05/09/2022 for lab work only  Charlene Brooke, PharmD notified and will determine if action is needed.  Charlene Brooke, CPP notified  Marijean Niemann, [place] Clinical Pharmacy Assistant [phone]

## 2022-04-21 ENCOUNTER — Other Ambulatory Visit: Payer: Self-pay | Admitting: Family Medicine

## 2022-04-21 DIAGNOSIS — E559 Vitamin D deficiency, unspecified: Secondary | ICD-10-CM

## 2022-04-22 NOTE — Telephone Encounter (Signed)
Refill request for VITAMIN D2 1.'25MG'$ (50,000 UNIT)   LOV - 02/07/22 Next OV - not scheduled but has lab 05/09/22 Last refill - 02/11/22 #12/0 Last lab - 02/07/22 and was 18.39

## 2022-04-23 NOTE — Telephone Encounter (Signed)
Would recheck vit D prior to continuing rx.  Order is in EMR.  Please set up lab appointment.  Thanks.

## 2022-04-24 NOTE — Telephone Encounter (Signed)
Left message for patient that rx was refused until labs come back.

## 2022-05-09 ENCOUNTER — Other Ambulatory Visit (INDEPENDENT_AMBULATORY_CARE_PROVIDER_SITE_OTHER): Payer: Medicare Other

## 2022-05-09 DIAGNOSIS — E559 Vitamin D deficiency, unspecified: Secondary | ICD-10-CM | POA: Diagnosis not present

## 2022-05-09 LAB — VITAMIN D 25 HYDROXY (VIT D DEFICIENCY, FRACTURES): VITD: 21.41 ng/mL — ABNORMAL LOW (ref 30.00–100.00)

## 2022-05-12 ENCOUNTER — Other Ambulatory Visit: Payer: Self-pay | Admitting: Family Medicine

## 2022-05-12 DIAGNOSIS — E559 Vitamin D deficiency, unspecified: Secondary | ICD-10-CM

## 2022-05-12 MED ORDER — VITAMIN D (ERGOCALCIFEROL) 1.25 MG (50000 UNIT) PO CAPS: 50000.0000 [IU] | ORAL_CAPSULE | ORAL | 0 refills | Status: DC

## 2022-07-27 ENCOUNTER — Other Ambulatory Visit: Payer: Self-pay | Admitting: Family Medicine

## 2022-07-27 DIAGNOSIS — E559 Vitamin D deficiency, unspecified: Secondary | ICD-10-CM

## 2022-07-29 NOTE — Telephone Encounter (Signed)
Patient needs lab appt to recheck Vit D before rx can be renewed. Please call to set up non fasting lab appt.

## 2022-07-29 NOTE — Telephone Encounter (Signed)
Patient scheduled.

## 2022-07-31 ENCOUNTER — Other Ambulatory Visit: Payer: Self-pay | Admitting: Family Medicine

## 2022-07-31 ENCOUNTER — Other Ambulatory Visit (INDEPENDENT_AMBULATORY_CARE_PROVIDER_SITE_OTHER): Payer: Medicare Other

## 2022-07-31 DIAGNOSIS — E559 Vitamin D deficiency, unspecified: Secondary | ICD-10-CM

## 2022-07-31 LAB — VITAMIN D 25 HYDROXY (VIT D DEFICIENCY, FRACTURES): VITD: 26.59 ng/mL — ABNORMAL LOW (ref 30.00–100.00)

## 2022-07-31 MED ORDER — VITAMIN D (ERGOCALCIFEROL) 1.25 MG (50000 UNIT) PO CAPS: 50000.0000 [IU] | ORAL_CAPSULE | ORAL | 0 refills | Status: DC

## 2022-08-18 ENCOUNTER — Encounter: Payer: Self-pay | Admitting: Family Medicine

## 2022-08-19 ENCOUNTER — Encounter: Payer: Self-pay | Admitting: Family Medicine

## 2022-08-19 ENCOUNTER — Ambulatory Visit (INDEPENDENT_AMBULATORY_CARE_PROVIDER_SITE_OTHER): Payer: Medicare Other | Admitting: Family Medicine

## 2022-08-19 VITALS — BP 110/60 | HR 77 | Temp 97.6°F | Ht 70.0 in | Wt 212.5 lb

## 2022-08-19 DIAGNOSIS — R600 Localized edema: Secondary | ICD-10-CM

## 2022-08-19 DIAGNOSIS — R0600 Dyspnea, unspecified: Secondary | ICD-10-CM | POA: Diagnosis not present

## 2022-08-19 MED ORDER — FUROSEMIDE 20 MG PO TABS: 20.0000 mg | ORAL_TABLET | Freq: Every day | ORAL | 0 refills | Status: DC

## 2022-08-19 NOTE — Progress Notes (Signed)
Jennifer Frank T. Eusevio [facility], MD, [facility] [place], [address]  Phone: [phone]  FAX: [place] - 72 y.o. female  MRN [id number]  Date of Birth: 1951-03-17  Date: 08/19/2022  PCP: Tonia Ghent, MD  Referral: Tonia Ghent, MD  Chief Complaint  Patient presents with   Foot Swelling   Subjective:   Jennifer Frank is a 72 y.o. very pleasant female patient with Body mass index is 30.49 kg/m. who presents with the following:  She reports having some lower extremity swelling bilaterally off and on for 10 years.  This has been particularly bad over the last 2 weeks, and the last 1 week it has been as bad as it has ever been in the past.  She is not have any pain.  There is no warmth.  She does have some redness in the lower extremities, it sounds as if this is chronic.  She has seen vascular surgery before, she has normal ABIs.  She does have a remote history of DVT.  Lower extremity swelling - B legs.  The last 2 weeks worsening, and particularly worse over the last 1 week.  Not able to get on some support hose.     Review of Systems is noted in the HPI, as appropriate  Objective:   BP 110/60   Pulse 77   Temp 97.6 F (36.4 C) (Temporal)   Ht 5\' 10"  (1.778 m)   Wt 212 lb 8 oz (96.4 kg)   SpO2 98%   BMI 30.49 kg/m   GEN: No acute distress; alert,appropriate. PULM: Breathing comfortably in no respiratory distress PSYCH: Normally interactive.     2-3+ lower extremity edema bilateral legs up to the knee  Laboratory and Imaging Data:  Assessment and Plan:     ICD-10-CM   1. Bilateral lower extremity edema  R60.0 Brain natriuretic peptide    Basic metabolic panel    Hepatic function panel    2. Dyspnea, unspecified type  R06.00 Brain natriuretic peptide    Basic metabolic panel    Hepatic function panel     Acute on chronic lower extremity edema with  exacerbation.  Most likely, venous reflux.  Given more acute onset, I think it is reasonable to check a cardiac BNP.  I will also make sure that her BMP and hepatic function is normal as well.  Lasix for the next 4 days, and as able use her compression socks when she is able to get them over her feet and legs.  Medication Management during today's office visit: Meds ordered this encounter  Medications   furosemide (LASIX) 20 MG tablet    Sig: Take 1 tablet (20 mg total) by mouth daily.    Dispense:  10 tablet    Refill:  0   Medications Discontinued During This Encounter  Medication Reason   oseltamivir (TAMIFLU) 75 MG capsule Completed Course   ondansetron (ZOFRAN) 4 MG tablet Completed Course    Orders placed today for conditions managed today: Orders Placed This Encounter  Procedures   Brain natriuretic peptide   Basic metabolic panel   Hepatic function panel    Disposition: No follow-ups on file.  [employer] One speech-to-text software was used for transcription in this dictation.  Possible transcriptional errors can occur using Editor, commissioning.   Signed,  Maud Deed. Kayleena Eke, MD   Outpatient Encounter Medications as of 08/19/2022  Medication Sig  betamethasone valerate (VALISONE) 0.1 % cream APPLY A THIN LAYER TO THE AFFECTED AREA(S) BY TOPICAL ROUTE ONCE DAILY AS NEEDED   famotidine (PEPCID) 20 MG tablet Take 1 tablet (20 mg total) by mouth daily.   furosemide (LASIX) 20 MG tablet Take 1 tablet (20 mg total) by mouth daily.   loratadine (CLARITIN) 10 MG tablet Take 1 tablet (10 mg total) by mouth daily.   mirabegron ER (MYRBETRIQ) 25 MG TB24 tablet Take 1 tablet by mouth daily.   Vitamin D, Ergocalciferol, (DRISDOL) 1.25 MG (50000 UNIT) CAPS capsule Take 1 capsule (50,000 Units total) by mouth every 7 (seven) days.   [DISCONTINUED] ondansetron (ZOFRAN) 4 MG tablet Take 1 tablet (4 mg total) by mouth every 6 (six) hours.   [DISCONTINUED] oseltamivir (TAMIFLU) 75  MG capsule Take 1 capsule (75 mg total) by mouth every 12 (twelve) hours.   No facility-administered encounter medications on file as of 08/19/2022.

## 2022-08-20 LAB — BASIC METABOLIC PANEL
BUN: 14 mg/dL (ref 6–23)
CO2: 30 mEq/L (ref 19–32)
Calcium: 9.5 mg/dL (ref 8.4–10.5)
Chloride: 104 mEq/L (ref 96–112)
Creatinine, Ser: 0.87 mg/dL (ref 0.40–1.20)
GFR: 66.9 mL/min (ref >60.00)
Glucose, Bld: 97 mg/dL (ref 70–99)
Potassium: 4 mEq/L (ref 3.5–5.1)
Sodium: 140 mEq/L (ref 135–145)

## 2022-08-20 LAB — HEPATIC FUNCTION PANEL
ALT: 19 U/L (ref 0–35)
AST: 19 U/L (ref 0–37)
Albumin: 4.1 g/dL (ref 3.5–5.2)
Alkaline Phosphatase: 74 U/L (ref 39–117)
Bilirubin, Direct: 0 mg/dL (ref 0.0–0.3)
Total Bilirubin: 0.2 mg/dL (ref 0.2–1.2)
Total Protein: 7.3 g/dL (ref 6.0–8.3)

## 2022-08-20 LAB — BRAIN NATRIURETIC PEPTIDE: Pro B Natriuretic peptide (BNP): 52 pg/mL (ref 0.0–100.0)

## 2022-09-05 ENCOUNTER — Ambulatory Visit (INDEPENDENT_AMBULATORY_CARE_PROVIDER_SITE_OTHER): Payer: Medicare Other | Admitting: Family Medicine

## 2022-09-05 ENCOUNTER — Encounter: Payer: Self-pay | Admitting: Family Medicine

## 2022-09-05 VITALS — BP 104/70 | HR 63 | Temp 98.0°F | Ht 70.0 in | Wt 206.0 lb

## 2022-09-05 DIAGNOSIS — R251 Tremor, unspecified: Secondary | ICD-10-CM | POA: Diagnosis not present

## 2022-09-05 DIAGNOSIS — R609 Edema, unspecified: Secondary | ICD-10-CM

## 2022-09-05 DIAGNOSIS — R29898 Other symptoms and signs involving the musculoskeletal system: Secondary | ICD-10-CM | POA: Diagnosis not present

## 2022-09-05 MED ORDER — FUROSEMIDE 20 MG PO TABS: 20.0000 mg | ORAL_TABLET | Freq: Every day | ORAL | 1 refills | Status: DC | PRN

## 2022-09-05 MED ORDER — MIRABEGRON ER 25 MG PO TB24: 25.0000 mg | ORAL_TABLET | Freq: Every day | ORAL | 3 refills | Status: DC

## 2022-09-05 NOTE — Progress Notes (Signed)
Prev eval for BLE edema.  It got worse recently.  That led to prev OV with Dr. Patsy Lager, labs unremarkable, she improved on lasix.  Off med now and BLE edema returned.  Down 6 lbs from prev OV.  She has been off lasix for about 1 week.  She isn't taking extra salt.   She needed refill on myrbetriq.  It helped but didn't completely resolve her symptoms.  No ADE on med.  She has some urine leak at night.    Prev falls d/w pt.  One time she was reaching over a tub and the chair slid on hardwood floor.  That was a mechanical fall.  She thought edema at the knees and ankles contributed.  Her knee ROM improved with dec in edema.  She had used compression stockings in the distant past.  She doesn't feel lightheaded.  No syncope.    She has occ R handed tremor, also with occ R foot tremor but less common than the R hand.  Intermittently noted.    Meds, vitals, and allergies reviewed.   ROS: Per HPI unless specifically indicated in ROS section   GEN: nad, alert and oriented HEENT: ncat NECK: supple w/o LA CV: rrr.  PULM: ctab, no inc wob ABD: soft, +bs EXT: 1+ BLE edema SKIN: no acute rash She has PROM hips B intact but she has limited B hip flexion strength when sitting.  Normal S/S BUE.  CN 2-12 wnl.   DTR wnl x 4.   Right hand tremor intermittently noted.  30 minutes were devoted to patient care in this encounter (this includes time spent reviewing the patient's file/history, interviewing and examining the patient, counseling/reviewing plan with patient).

## 2022-09-05 NOTE — Patient Instructions (Addendum)
Restart lasix as needed and restart compression stockings.   Let me know if you don't get a call about PT and neurology.  Take care.  Glad to see you.

## 2022-09-08 DIAGNOSIS — R251 Tremor, unspecified: Secondary | ICD-10-CM | POA: Insufficient documentation

## 2022-09-08 NOTE — Assessment & Plan Note (Signed)
Restart Lasix, 20 mg daily if needed for swelling.  Restart using compression stockings and update me as needed.

## 2022-09-08 NOTE — Assessment & Plan Note (Signed)
Of unclear source.  This appears to be a separate issue from her swelling.  She also has limited bilateral hip flexion when sitting.  I want neurology input.  I think it makes sense to see physical therapy in the meantime.  Refer for both.  See after visit summary.  She agrees to plan.

## 2022-09-13 ENCOUNTER — Encounter: Payer: Self-pay | Admitting: Neurology

## 2022-09-27 ENCOUNTER — Other Ambulatory Visit: Payer: Self-pay | Admitting: Family Medicine

## 2022-10-02 ENCOUNTER — Ambulatory Visit: Payer: Medicare Other | Attending: Family Medicine | Admitting: Physical Therapy

## 2022-10-02 DIAGNOSIS — R29898 Other symptoms and signs involving the musculoskeletal system: Secondary | ICD-10-CM | POA: Insufficient documentation

## 2022-10-02 DIAGNOSIS — R296 Repeated falls: Secondary | ICD-10-CM | POA: Diagnosis not present

## 2022-10-02 NOTE — Therapy (Signed)
OUTPATIENT PHYSICAL THERAPY LOWER EXTREMITY EVALUATION   Patient Name: Jennifer Frank MRN: [id number] DOB:03-Mar-1951, 72 y.o., female Today's Date: 10/02/2022  END OF SESSION:  PT End of Session - 10/02/22 1254     Visit Number 1    Number of Visits 20    Date for PT Re-Evaluation 12/11/22    Authorization Type [employer] Medicare 2024    Authorization - Visit Number 1    Authorization - Number of Visits 20    Progress Note Due on Visit 10    PT Start Time 1115    PT Stop Time 1200    PT Time Calculation (min) 45 min    Activity Tolerance Patient tolerated treatment well    Behavior During Therapy WFL for tasks assessed/performed             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   Fundic gland polyps of stomach, benign    GERD (gastroesophageal reflux disease)    past hx    Hearing loss    IBS (irritable bowel syndrome)    Personal history of colonic polyps - adenoma 09/22/2013   PONV (postoperative nausea and vomiting)    Reflux    Past Surgical History:  Procedure Laterality Date   HIP ARTHROSCOPY Left 1997   KNEE ARTHROSCOPY Left 1996   KNEE ARTHROSCOPY Right    05-2016 same time as left knee replacement    POLYPECTOMY     TONSILLECTOMY AND ADENOIDECTOMY  1957   TOTAL KNEE ARTHROPLASTY Bilateral 05/27/2016   Procedure: LEFT TOTAL KNEE ARTHROPLASTY WITH RIGHT KNEE ARTHROSCOPY;  Surgeon: Gean Birchwood, MD;  Location: [facility] OR;  Service: Orthopedics;  Laterality: Bilateral;   UPPER GASTROINTESTINAL ENDOSCOPY     WISDOM TOOTH EXTRACTION     Patient Active Problem List   Diagnosis Date Noted   Tremor 09/08/2022   Osteopenia 02/11/2022   SOB (shortness of breath) 05/27/2021   Aortic atherosclerosis (HCC) 03/12/2020   Medicare annual wellness visit, subsequent 01/10/2019   Advance care planning 12/14/2017   Urinary incontinence 12/14/2017   Primary osteoarthritis of left knee  05/26/2016   Health care maintenance 04/22/2016   Hearing loss 04/22/2014   Varicose veins of lower extremities with other complications 01/25/2013   Edema 01/25/2013   Hyperlipidemia 02/16/2007   GERD 02/16/2007    PCP: Dr. Crawford Givens   REFERRING PROVIDER: Dr. Crawford Givens   REFERRING DIAG: LE weakness and Imbalance   THERAPY DIAG:  Repeated falls  Weakness of lower extremity, unspecified laterality  Rationale for Evaluation and Treatment: Rehabilitation  ONSET DATE: 10/01/2021 (1 yr ago)  SUBJECTIVE:   SUBJECTIVE STATEMENT: See pertinent history   PERTINENT HISTORY: Pt reports having history of bilateral hip replacements and left knee replacement. She has recently been having bilateral swelling in her lower extremities and she attributes this to poor circulation in her legs. She has been prescribed lasix to reduce the swelling which has helped reduce edema in her legs. Prior to edema, she had fallen six times and once while reaching to grab chair and the chair sliding forward and her losing her balance. She is not sure why she is having trouble with balance, but she believes it is due to her LE weakness. Pt is unable to flex right hip without feeling increased right groin pain and she must lean back to compensate by backwards trunk lean. She has been  referred to PT and has a neurology apt scheduled for June. She has noted tremors in her right hand, arm, and leg. Her husband has COPD and he is unable to do many physical activities, so pt does physical activities such as mowing and gardening. She mostly loses her balance to her left side when walking.  PAIN:  Are you having pain? Yes: NPRS scale: 9/10 Pain location: Right groin Pain description: Achy  Aggravating factors: Flexion of right hip  Relieving factors: Not flexing hip   PRECAUTIONS: None  WEIGHT BEARING RESTRICTIONS: No  FALLS:  Has patient fallen in last 6 months? Yes. Number of falls 6-7  LIVING  ENVIRONMENT: Lives with: lives with their spouse Lives in: House/apartment Stairs: Yes: External: 3 steps; on right going up Has following equipment at home: Dan Humphreys - 2 wheeled  OCCUPATION: Retired   PLOF: Independent  PATIENT GOALS: Improve her balance so she does not fall   NEXT MD VISIT: Nothing scheduled   OBJECTIVE:   VITALS: BP 115/59 hr 70   DIAGNOSTIC FINDINGS: CLINICAL DATA:  Right hip pain after bending last night, no known injury   EXAM: DG HIP (WITH OR WITHOUT PELVIS) 2-3V RIGHT   COMPARISON:  None.   FINDINGS: Three views of the right hip submitted. No acute fracture or subluxation. There is bilateral hip prosthesis with anatomic alignment. Degenerative changes pubic symphysis. Pelvic phleboliths are noted. Calcification in right upper pelvis may be vascular in nature measures 6 mm. A distal right ureteral calculus cannot be entirely excluded.   IMPRESSION: There is bilateral hip prosthesis with anatomic alignment. Degenerative changes pubic symphysis. Pelvic phleboliths are noted. Calcification in right upper pelvis may be vascular in nature measures 6 mm. A distal right ureteral calculus cannot be entirely excluded.     Electronically Signed   By: Natasha Mead M.D.   On: 06/06/2015 09:48    PATIENT SURVEYS:  FOTO 49/100 with target of 55   COGNITION: Overall cognitive status: Within functional limits for tasks assessed     SENSATION: WFL  EDEMA: Swelling in both ankles with redness.   MUSCLE LENGTH: Hamstrings: Right 70 deg; Left 70 deg Thomas test: NT   POSTURE: rounded shoulders  PALPATION: Right hip iliopsoas tendon   LOWER EXTREMITY ROM:   Active  Right 10/02/2022 Left 10/02/2022  Hip flexion 120 120  Hip extension    Hip abduction    Hip adduction    Hip internal rotation    Hip external rotation    Knee flexion    Knee extension    Ankle dorsiflexion    Ankle plantarflexion    Ankle inversion    Ankle eversion      (Blank rows = not tested)     LOWER EXTREMITY MMT:  MMT Right eval Left eval  Hip flexion 3- 4-   Hip extension 4- 4-  Hip abduction 4- 4-  Hip adduction 4- 4-  Hip internal rotation    Hip external rotation    Knee flexion 4+ 4+  Knee extension 4+ 4+  Ankle dorsiflexion 4+ 4+  Ankle plantarflexion NT  NT   Ankle inversion    Ankle eversion     (Blank rows = not tested)  LOWER EXTREMITY SPECIAL TESTS:  Hip special tests: Luisa Hart (FABER) test: positive , Ober's test: negative, Ely's test: positive , and FADIR + RLE  FUNCTIONAL TESTS:  5 times sit to stand: 0 reps (needs to use hands) 30 seconds chair stand test: 0 reps (  needs to use hands) 6 minute walk test: NT  10 meter walk test: NT  Dynamic Gait Index: NT   GAIT: Distance walked: 50 ft  Assistive device utilized: None Level of assistance: Complete Independence Comments: Decreased step length bilaterally especially on RLE along with decreased right hip flexion    TODAY'S TREATMENT:                                                                                                                              DATE:   10/02/22  HS stretch 3 x 30 sec  Prone Quad Stretch 3 x 30 sec  Sit to Stand with 1 UE support 1 x 10  -min VC for fast stand along with slow eccentric lowering   PATIENT EDUCATION:  Education details: for correct performance of exercise  Person educated: Patient Education method: Programmer, multimedia, Demonstration, Verbal cues, and Handouts Education comprehension: verbalized understanding, returned demonstration, and verbal cues required  HOME EXERCISE PROGRAM: Access Code: [email or web address] URL: [email or web address] Date: 10/02/2022 Prepared by: Ellin Goodie  Exercises - Sit to Stand with Counter Support  - 3-4 x weekly - 3 sets - 10 reps - Prone Quadriceps Stretch with Strap  - 1 x daily - 3 reps - 60 sec hold - Seated Hamstring Stretch  - 1 x daily - 3 reps - 60 sec   hold  ASSESSMENT:  CLINICAL IMPRESSION: Patient is a 72 y.o. white female who was seen today for physical therapy evaluation and treatment for frequent falls in the setting of increased LE weakness. She does demonstrate decreased LE strength and endurance that place her at an increased risk for falls as well as decreased right hip strength and AROM and pain with increased right hip flexion. Unable to finish all of the tests due to time constraints and PT to plan on finishing next session. Pt does have intermittent tremors and she is following up with neurology. This does warrant further neurological testing which will also be conducted during next session. Pt will benefit from skilled PT to address the aforementioned deficits to improve her LE strength and endurance in order to decrease her risk of falling to remain independent and physically capable of providing care to her husband.   OBJECTIVE IMPAIRMENTS: Abnormal gait, decreased balance, decreased endurance, difficulty walking, decreased ROM, decreased strength, impaired flexibility, and pain.   ACTIVITY LIMITATIONS: carrying, lifting, bending, standing, squatting, stairs, transfers, and caring for others  PARTICIPATION LIMITATIONS: cleaning, laundry, shopping, community activity, and yard work  PERSONAL FACTORS: Age, Fitness, Past/current experiences, Time since onset of injury/illness/exacerbation, and 1-2 comorbidities: bilateral hip replacements and left TKA along with bilateral swelling with intermittent increased edema are also affecting patient's functional outcome.   REHAB POTENTIAL: Good  CLINICAL DECISION MAKING: Stable/uncomplicated  EVALUATION COMPLEXITY: Low   GOALS: Goals reviewed with patient? No  SHORT TERM GOALS: Target date: 10/16/2022  Pt will be independent with HEP in order to improve strength and balance in  order to decrease fall risk and improve function at home and work. Baseline: NT  Goal status: INITIAL  2.   Pt will increase by at least 0.13 m/s in order to demonstrate clinically significant improvement in community ambulation.  Baseline: NT  Goal status: INITIAL  3. Pt will increase by at least 91m (126ft) in order to demonstrate clinically significant improvement in cardiopulmonary endurance and community ambulation Baseline: NT  Goal status: INITIAL   LONG TERM GOALS: Target date: 12/11/2022  Patient will have improved function and activity level as evidenced by an increase in FOTO score by 10 points or more.  Baseline:  Goal status: INITIAL  2.  Patient will perform five sit to stand repetitions in <=15 secs as evidence of LE strength that shows that this community dwelling older adult that is older is at a decreased risk of falling. (Buatois 2010)   Baseline: 0 reps  Goal status: INITIAL  3.  Patient will perform >=10 sit to stands from 18 inch chair height as evidence of LE endurance that shows she is above cut-off for age and gender matched norms and that indicate she is at a decreased risk of falling.  Baseline: 0 reps (must use hands) Goal status: INITIAL  4.  Patient will demonstrate reduced falls risk as evidenced by Dynamic Gait Index (DGI) >19/24. Baseline: NT  Goal status: INITIAL  5.  Patient will perform 10 mWT for normal gait speed at >=1.0 m/sec as an indication that she is above cut-off for fall risks.  Baseline: NT  Goal status: INITIAL  6. Patient will ambulate >=1000 ft in as evidence that she has sufficient aerobic endurance to be a community ambulator in order to complete tasks like shopping required to care for her husband.  Baseline: NT  Goal status: INITIAL  PLAN:  PT FREQUENCY: 1-2x/week  PT DURATION: 10 weeks  PLANNED INTERVENTIONS: Therapeutic exercises, Therapeutic activity, Neuromuscular re-education, Balance training, Gait training, Patient/Family education, Joint mobilization, Joint manipulation, Stair training, DME instructions,  Aquatic Therapy, Dry Needling, Electrical stimulation, Spinal manipulation, Spinal mobilization, Cryotherapy, Moist heat, Manual therapy, and Re-evaluation  PLAN FOR NEXT SESSION: , DGI, , neuroglical testing (RAMs, reflexes, H2S, F2N)  Ellin Goodie PT, DPT  10/02/2022, 1:01 PM

## 2022-10-02 NOTE — Progress Notes (Signed)
  Physician Documentation Your signature is required to indicate approval of the treatment plan as stated above. By signing this report, you are approving the plan of care. Please sign and either send electronically or print and fax the signed copy to the number below. If you approve with modifications, please indicate those in the space provided. Physician Signature: Cheree Ditto Para March Date:__05/15/24  Time:_2:37 PM

## 2022-10-09 ENCOUNTER — Ambulatory Visit: Payer: Medicare Other | Admitting: Physical Therapy

## 2022-10-09 DIAGNOSIS — R29898 Other symptoms and signs involving the musculoskeletal system: Secondary | ICD-10-CM | POA: Diagnosis not present

## 2022-10-09 DIAGNOSIS — R296 Repeated falls: Secondary | ICD-10-CM

## 2022-10-09 NOTE — Therapy (Signed)
OUTPATIENT PHYSICAL THERAPY TREATMENT NOTE   Patient Name: Jennifer Frank MRN: [id number] DOB:March 24, 1951, 72 y.o., female Today's Date: 10/09/2022  PCP: Dr. Crawford Givens  REFERRING PROVIDER: Dr. 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   Fundic gland polyps of stomach, benign    GERD (gastroesophageal reflux disease)    past hx    Hearing loss    IBS (irritable bowel syndrome)    Personal history of colonic polyps - adenoma 09/22/2013   PONV (postoperative nausea and vomiting)    Reflux    Past Surgical History:  Procedure Laterality Date   HIP ARTHROSCOPY Left 1997   KNEE ARTHROSCOPY Left 1996   KNEE ARTHROSCOPY Right    05-2016 same time as left knee replacement    POLYPECTOMY     TONSILLECTOMY AND ADENOIDECTOMY  1957   TOTAL KNEE ARTHROPLASTY Bilateral 05/27/2016   Procedure: LEFT TOTAL KNEE ARTHROPLASTY WITH RIGHT KNEE ARTHROSCOPY;  Surgeon: Gean Birchwood, MD;  Location: [facility] OR;  Service: Orthopedics;  Laterality: Bilateral;   UPPER GASTROINTESTINAL ENDOSCOPY     WISDOM TOOTH EXTRACTION     Patient Active Problem List   Diagnosis Date Noted   Tremor 09/08/2022   Osteopenia 02/11/2022   SOB (shortness of breath) 05/27/2021   Aortic atherosclerosis (HCC) 03/12/2020   Medicare annual wellness visit, subsequent 01/10/2019   Advance care planning 12/14/2017   Urinary incontinence  12/14/2017   Primary osteoarthritis of left knee 05/26/2016   Health care maintenance 04/22/2016   Hearing loss 04/22/2014   Varicose veins of lower extremities with other complications 01/25/2013   Edema 01/25/2013   Hyperlipidemia 02/16/2007   GERD 02/16/2007    REFERRING DIAG: Repeated falls   Weakness of lower extremity, unspecified laterality  THERAPY DIAG:  Repeated falls  Weakness of lower extremity, unspecified laterality  Rationale for Evaluation and Treatment Rehabilitation  PERTINENT HISTORY: Pt reports having history of bilateral hip replacements and left knee replacement. She has recently been having bilateral swelling in her lower extremities and she attributes this to poor circulation in her legs. She has been prescribed lasix to reduce the swelling which has helped reduce edema in her legs. Prior to edema, she had fallen six times and once while reaching to grab chair and the chair sliding forward and her losing her balance. She is not sure why she is having trouble with balance, but she believes it is due to her LE weakness. Pt is unable to flex right hip without feeling increased right groin pain and she must lean back to compensate by backwards trunk lean. She has been referred to PT and has a neurology apt scheduled for June. She has  noted tremors in her right hand, arm, and leg. Her husband has COPD and he is unable to do many physical activities, so pt does physical activities such as mowing and gardening. She mostly loses her balance to her left side when walking.   PRECAUTIONS: Falls   SUBJECTIVE:                                                                                                                                                                                      SUBJECTIVE STATEMENT:  Pt reports that she believes that the exercises have been helping especially with her right hip ROM. She also has not had any falls since last session.    PAIN:  Are  you having pain? No   OBJECTIVE: (objective measures completed at initial evaluation unless otherwise dated)  VITALS: BP 115/59 hr 70    DIAGNOSTIC FINDINGS: CLINICAL DATA:  Right hip pain after bending last night, no known injury   EXAM: DG HIP (WITH OR WITHOUT PELVIS) 2-3V RIGHT   COMPARISON:  None.   FINDINGS: Three views of the right hip submitted. No acute fracture or subluxation. There is bilateral hip prosthesis with anatomic alignment. Degenerative changes pubic symphysis. Pelvic phleboliths are noted. Calcification in right upper pelvis may be vascular in nature measures 6 mm. A distal right ureteral calculus cannot be entirely excluded.   IMPRESSION: There is bilateral hip prosthesis with anatomic alignment. Degenerative changes pubic symphysis. Pelvic phleboliths are noted. Calcification in right upper pelvis may be vascular in nature measures 6 mm. A distal right ureteral calculus cannot be entirely excluded.     Electronically Signed   By: Natasha Mead M.D.   On: 06/06/2015 09:48     PATIENT SURVEYS:  FOTO 49/100 with target of 55    COGNITION: Overall cognitive status: Within functional limits for tasks assessed                         SENSATION: WFL   EDEMA: Swelling in both ankles with redness.    MUSCLE LENGTH: Hamstrings: Right 70 deg; Left 70 deg Thomas test: + on RLE  Ober's test: NT     POSTURE: rounded shoulders   PALPATION: Right hip iliopsoas tendon TTP    LOWER EXTREMITY ROM:     Active  Right 10/02/2022 Left 10/02/2022  Hip flexion 120 120  Hip extension      Hip abduction      Hip adduction      Hip internal rotation      Hip external rotation      Knee flexion      Knee  extension      Ankle dorsiflexion      Ankle plantarflexion      Ankle inversion      Ankle eversion       (Blank rows = not tested)        LOWER EXTREMITY MMT:   MMT Right eval Left eval  Hip flexion 3- 4-   Hip extension 4- 4-  Hip  abduction 4- 4-  Hip adduction 4- 4-  Hip internal rotation      Hip external rotation      Knee flexion 4+ 4+  Knee extension 4+ 4+  Ankle dorsiflexion 4+ 4+  Ankle plantarflexion NT  NT   Ankle inversion      Ankle eversion       (Blank rows = not tested)   LOWER EXTREMITY SPECIAL TESTS:  Hip special tests: Luisa Hart (FABER) test: positive , Ober's test: negative, Ely's test: positive , and FADIR + RLE   FUNCTIONAL TESTS:  5 times sit to stand: 0 reps (needs to use hands) 30 seconds chair stand test: 0 reps (needs to use hands) 6 minute walk test: 1,175 ft  10 meter walk test: 0.98 m/sec   Dynamic Gait Index: 17/24   GAIT: Distance walked: 50 ft  Assistive device utilized: None Level of assistance: Complete Independence Comments: Decreased step length bilaterally especially on RLE along with decreased right hip flexion      TODAY'S TREATMENT:                                                                                                                              DATE:    10/09/22: : 1,175 ft  10 Meter Walk Test   Gait Speed:    1st trial 9.66 sec , 2nd trial 10.66 , Avg= 10.16sec - Normal Gait speed Avg=  0.98 m/sec   Fast Gait Speed: 1st trial 7.23 sec, 2nd 8.05  sec, Avg=  1.31 sec -Fast Gait speed time= m/sec   -> 1 m/sec [employer] and cross street safely and normal WS <1 m/sec Need Intervention to reduce falls risk  0.12 m/sec improvement represents a statistically significant improvement in gait speed    - 0.8 - 1.3 m/sec - community ambulator - associated with increased independence in self-care  DGI: 17/24 Mild Impairment: Head turns, obstacle  Moderate Impairment: Gait and pivot turn, stairs <19/24 indicates pt is at risk for falls  RAMS: Negative bilateral  Thomas Test: + RLE  Modified Thomas Stretch 3 x 30 sec   10/02/22  HS stretch 3 x 30 sec  Prone Quad Stretch 3 x 30 sec  Sit to Stand with 1 UE support 1 x 10  -min VC for fast  stand along with slow eccentric lowering    PATIENT EDUCATION:  Education details: for correct performance of exercise  Person educated: Patient Education method: Explanation, Demonstration, Verbal cues, and Handouts Education comprehension: verbalized understanding, returned demonstration, and  verbal cues required   HOME EXERCISE PROGRAM: Access Code: [email or web address] URL: [email or web address] Date: 10/09/2022 Prepared by: Ellin Goodie  Exercises - Sit to Stand with Counter Support  - 3-4 x weekly - 3 sets - 10 reps - Prone Quadriceps Stretch with Strap  - 1 x daily - 3 reps - 60 sec hold - Seated Hamstring Stretch  - 1 x daily - 3 reps - 60 sec  hold - Modified Thomas Stretch  - 1 x daily - 3 reps - 30 hold   ASSESSMENT:   CLINICAL IMPRESSION:  Pt demonstrates dynamic balance deficits that place her at an increased risk for falls especially with pivot turns and stopping and negotiating stairs. 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 She has ongoing right hip mobility restrictions especially with hip flexion and increased pain with psoas activation with hip flexion in supine position with right leg in extension. There is potential here for right hip impingement with groin or psoas region TTP, hip flexor weakness, and pain with hip flexion. Neurological involvement somewhat ruled out with negative rapid alternating movement test. She will benefit from skilled PT to address the aforementioned deficits to improve her LE strength and endurance in order to decrease her risk of falling to remain independent and physically capable of providing care to her husband.     OBJECTIVE IMPAIRMENTS: Abnormal gait, decreased balance, decreased endurance, difficulty walking, decreased ROM, decreased strength, impaired flexibility, and pain.    ACTIVITY LIMITATIONS: carrying, lifting, bending, standing,  squatting, stairs, transfers, and caring for others   PARTICIPATION LIMITATIONS: cleaning, laundry, shopping, community activity, and yard work   PERSONAL FACTORS: Age, Fitness, Past/current experiences, Time since onset of injury/illness/exacerbation, and 1-2 comorbidities: bilateral hip replacements and left TKA along with bilateral swelling with intermittent increased edema are also affecting patient's functional outcome.    REHAB POTENTIAL: Good   CLINICAL DECISION MAKING: Stable/uncomplicated   EVALUATION COMPLEXITY: Low     GOALS: Goals reviewed with patient? No   SHORT TERM GOALS: Target date: 10/16/2022   Pt will be independent with HEP in order to improve strength and balance in order to decrease fall risk and improve function at home and work. Baseline: NT  Goal status: ONGOING    2.  Pt will increase by at least 0.13 m/s in order to demonstrate clinically significant improvement in community ambulation.  Baseline:  Goal status: ONGOING    3. Pt will increase by at least 20m (193ft) in order to demonstrate clinically significant improvement in cardiopulmonary endurance and community ambulation Baseline: 1,175 ft  Goal status: ONGOING      LONG TERM GOALS: Target date: 12/11/2022   Patient will have improved function and activity level as evidenced by an increase in FOTO score by 10 points or more.  Baseline: 49/100 with target of 55 Goal status: ONGOING    2.  Patient will perform five sit to stand repetitions in <=15 secs as evidence of LE strength that shows that this community dwelling older adult that is older is at a decreased risk of falling. (Buatois 2010)   Baseline: 0 reps  Goal status: ONGOING    3.  Patient will perform >=10 sit to stands from 18 inch chair height as evidence of LE endurance that shows she is above cut-off for age and gender matched norms and that indicate she is at a decreased risk of falling.  Baseline: 0 reps (must use  hands) Goal status: ONGOING    4.  Patient will demonstrate reduced falls risk as evidenced by Dynamic Gait Index (DGI) >19/24. Baseline: 17/24 Goal status: ONGOING    5.  Patient will perform 10 mWT for normal gait speed at >=1.0 m/sec as an indication that she is above cut-off for fall risks.  Baseline: 0.98 m/sec Goal status: ONGOING    6. Patient will ambulate >=1000 ft in as evidence that she has sufficient aerobic endurance to be a community ambulator in order to complete tasks like shopping required to care for her husband.  Baseline: 1,175 ft  Goal status: ONGOING    PLAN:   PT FREQUENCY: 1-2x/week   PT DURATION: 10 weeks   PLANNED INTERVENTIONS: Therapeutic exercises, Therapeutic activity, Neuromuscular re-education, Balance training, Gait training, Patient/Family education, Joint mobilization, Joint manipulation, Stair training, DME instructions, Aquatic Therapy, Dry Needling, Electrical stimulation, Spinal manipulation, Spinal mobilization, Cryotherapy, Moist heat, Manual therapy, and Re-evaluation   PLAN FOR NEXT SESSION:  Right hip flexor stretch. Perform Ober's test. Continue to attempt to progress right hip flexion strength even if isometric as starting point. Mix in dynamic balance exercises especially turning and stopping. Perform neuroglical testing: reflexes on RLE.     Ellin Goodie PT, DPT  10/09/2022, 12:43 PM

## 2022-10-15 NOTE — Therapy (Signed)
OUTPATIENT PHYSICAL THERAPY TREATMENT NOTE   Patient Name: Jennifer Frank MRN: [id number] DOB:1950-09-04, 72 y.o., female Today's Date: 10/16/2022  PCP: Dr. Crawford Givens  REFERRING PROVIDER: Dr. 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   Fundic gland polyps of stomach, benign    GERD (gastroesophageal reflux disease)    past hx    Hearing loss    IBS (irritable bowel syndrome)    Personal history of colonic polyps - adenoma 09/22/2013   PONV (postoperative nausea and vomiting)    Reflux    Past Surgical History:  Procedure Laterality Date   HIP ARTHROSCOPY Left 1997   KNEE ARTHROSCOPY Left 1996   KNEE ARTHROSCOPY Right    05-2016 same time as left knee replacement    POLYPECTOMY     TONSILLECTOMY AND ADENOIDECTOMY  1957   TOTAL KNEE ARTHROPLASTY Bilateral 05/27/2016   Procedure: LEFT TOTAL KNEE ARTHROPLASTY WITH RIGHT KNEE ARTHROSCOPY;  Surgeon: Gean Birchwood, MD;  Location: [facility] OR;  Service: Orthopedics;  Laterality: Bilateral;   UPPER GASTROINTESTINAL ENDOSCOPY     WISDOM TOOTH EXTRACTION     Patient Active Problem List   Diagnosis Date Noted   Tremor 09/08/2022   Osteopenia 02/11/2022   SOB (shortness of breath) 05/27/2021   Aortic atherosclerosis (HCC) 03/12/2020   Medicare annual wellness visit, subsequent 01/10/2019   Advance care planning 12/14/2017   Urinary incontinence  12/14/2017   Primary osteoarthritis of left knee 05/26/2016   Health care maintenance 04/22/2016   Hearing loss 04/22/2014   Varicose veins of lower extremities with other complications 01/25/2013   Edema 01/25/2013   Hyperlipidemia 02/16/2007   GERD 02/16/2007    REFERRING DIAG: Repeated falls   Weakness of lower extremity, unspecified laterality  THERAPY DIAG:  Repeated falls  Weakness of lower extremity, unspecified laterality  Rationale for Evaluation and Treatment Rehabilitation  PERTINENT HISTORY: Pt reports having history of bilateral hip replacements and left knee replacement. She has recently been having bilateral swelling in her lower extremities and she attributes this to poor circulation in her legs. She has been prescribed lasix to reduce the swelling which has helped reduce edema in her legs. Prior to edema, she had fallen six times and once while reaching to grab chair and the chair sliding forward and her losing her balance. She is not sure why she is having trouble with balance, but she believes it is due to her LE weakness. Pt is unable to flex right hip without feeling increased right groin pain and she must lean back to compensate by backwards trunk lean. She has been referred to PT and has a neurology apt scheduled for June. She  has noted tremors in her right hand, arm, and leg. Her husband has COPD and he is unable to do many physical activities, so pt does physical activities such as mowing and gardening. She mostly loses her balance to her left side when walking.   PRECAUTIONS: Falls   SUBJECTIVE:                                                                                                                                                                                      SUBJECTIVE STATEMENT: Patient reports R hip giving her a fit this AM.   PAIN:  Are you having pain? No   OBJECTIVE: (objective measures completed at initial evaluation unless otherwise  dated)  VITALS: BP 115/59 hr 70    DIAGNOSTIC FINDINGS: CLINICAL DATA:  Right hip pain after bending last night, no known injury   EXAM: DG HIP (WITH OR WITHOUT PELVIS) 2-3V RIGHT   COMPARISON:  None.   FINDINGS: Three views of the right hip submitted. No acute fracture or subluxation. There is bilateral hip prosthesis with anatomic alignment. Degenerative changes pubic symphysis. Pelvic phleboliths are noted. Calcification in right upper pelvis may be vascular in nature measures 6 mm. A distal right ureteral calculus cannot be entirely excluded.   IMPRESSION: There is bilateral hip prosthesis with anatomic alignment. Degenerative changes pubic symphysis. Pelvic phleboliths are noted. Calcification in right upper pelvis may be vascular in nature measures 6 mm. A distal right ureteral calculus cannot be entirely excluded.     Electronically Signed   By: Natasha Mead M.D.   On: 06/06/2015 09:48     PATIENT SURVEYS:  FOTO 49/100 with target of 55    COGNITION: Overall cognitive status: Within functional limits for tasks assessed                         SENSATION: WFL   EDEMA: Swelling in both ankles with redness.    MUSCLE LENGTH: Hamstrings: Right 70 deg; Left 70 deg Thomas test: + on RLE  Ober's test: NT     POSTURE: rounded shoulders   PALPATION: Right hip iliopsoas tendon TTP    LOWER EXTREMITY ROM:     Active  Right 10/02/2022 Left 10/02/2022  Hip flexion 120 120  Hip extension      Hip abduction      Hip adduction      Hip internal rotation      Hip external rotation      Knee flexion      Knee extension      Ankle dorsiflexion      Ankle plantarflexion  Ankle inversion      Ankle eversion       (Blank rows = not tested)        LOWER EXTREMITY MMT:   MMT Right eval Left eval  Hip flexion 3- 4-   Hip extension 4- 4-  Hip abduction 4- 4-  Hip adduction 4- 4-  Hip internal rotation      Hip external rotation      Knee flexion 4+  4+  Knee extension 4+ 4+  Ankle dorsiflexion 4+ 4+  Ankle plantarflexion NT  NT   Ankle inversion      Ankle eversion       (Blank rows = not tested)   LOWER EXTREMITY SPECIAL TESTS:  Hip special tests: Luisa Hart (FABER) test: positive , Ober's test: negative, Ely's test: positive , and FADIR + RLE   FUNCTIONAL TESTS:  5 times sit to stand: 0 reps (needs to use hands) 30 seconds chair stand test: 0 reps (needs to use hands) 6 minute walk test: 1,175 ft  10 meter walk test: 0.98 m/sec   Dynamic Gait Index: 17/24   GAIT: Distance walked: 50 ft  Assistive device utilized: None Level of assistance: Complete Independence Comments: Decreased step length bilaterally especially on RLE along with decreased right hip flexion      TODAY'S TREATMENT:                                                                                                                              DATE:  10/16/22:  All exercises performed on R LE  Nustep seat 13, level 2 resistance x 5 minutes, slow warm up (up to 30 spm) Supine SLR x 2 on R with increased pain, unable to continue Supine quad sets 2 x 10 with 3 second hold, minimal discomfort  Hooklying hip adduction ball squeeze 3 x 10, minimal discomfort Hooklying isometric hip abduction against therapist hand 3 x 10, no discomfort Hooklying marching x 5, increased pain on R, unable to continue Hooklying isometric hip flexion against therapist hand 3 x 10, minimal discomfort   Ober's test: negative bilaterally  Reflexes: 1+ bilaterally    10/09/22: : 1,175 ft  10 Meter Walk Test   Gait Speed:    1st trial 9.66 sec , 2nd trial 10.66 , Avg= 10.16sec - Normal Gait speed Avg=  0.98 m/sec   Fast Gait Speed: 1st trial 7.23 sec, 2nd 8.05  sec, Avg=  1.31 sec -Fast Gait speed time= m/sec   -> 1 m/sec [employer] and cross street safely and normal WS <1 m/sec Need Intervention to reduce falls risk  0.12 m/sec improvement represents a statistically  significant improvement in gait speed    - 0.8 - 1.3 m/sec - community ambulator - associated with increased independence in self-care  DGI: 17/24 Mild Impairment: Head turns, obstacle  Moderate Impairment: Gait and pivot turn, stairs <19/24 indicates pt is at risk for falls  RAMS: Negative  bilateral  Thomas Test: + RLE  Modified Thomas Stretch 3 x 30 sec   10/02/22  HS stretch 3 x 30 sec  Prone Quad Stretch 3 x 30 sec  Sit to Stand with 1 UE support 1 x 10  -min VC for fast stand along with slow eccentric lowering    PATIENT EDUCATION:  Education details: for correct performance of exercise  Person educated: Patient Education method: Programmer, multimedia, Demonstration, Verbal cues, and Handouts Education comprehension: verbalized understanding, returned demonstration, and verbal cues required   HOME EXERCISE PROGRAM: Access Code: [email or web address] URL: [email or web address] Date: 10/09/2022 Prepared by: Ellin Goodie  Exercises - Sit to Stand with Counter Support  - 3-4 x weekly - 3 sets - 10 reps - Prone Quadriceps Stretch with Strap  - 1 x daily - 3 reps - 60 sec hold - Seated Hamstring Stretch  - 1 x daily - 3 reps - 60 sec  hold - Modified Thomas Stretch  - 1 x daily - 3 reps - 30 hold   ASSESSMENT:   CLINICAL IMPRESSION: Patient continues to demonstrate increased pain and discomfort with AROM exercises. Adjusted treatment session to patient's tolerance. Able to complete hip isometrics with minimal discomfort reported. Reflexes were diminished response bilaterally. Palpable tightness to anterolateral R hip with TTP. Patient inquiring about her reaching out to MD for guidance, encouraged patient to do so. Patient will continue to benefit from skilled therapy to address remaining deficits in order to improve quality of life and return to PLOF.      OBJECTIVE IMPAIRMENTS: Abnormal gait, decreased balance, decreased endurance, difficulty walking, decreased ROM, decreased  strength, impaired flexibility, and pain.    ACTIVITY LIMITATIONS: carrying, lifting, bending, standing, squatting, stairs, transfers, and caring for others   PARTICIPATION LIMITATIONS: cleaning, laundry, shopping, community activity, and yard work   PERSONAL FACTORS: Age, Fitness, Past/current experiences, Time since onset of injury/illness/exacerbation, and 1-2 comorbidities: bilateral hip replacements and left TKA along with bilateral swelling with intermittent increased edema are also affecting patient's functional outcome.    REHAB POTENTIAL: Good   CLINICAL DECISION MAKING: Stable/uncomplicated   EVALUATION COMPLEXITY: Low     GOALS: Goals reviewed with patient? No   SHORT TERM GOALS: Target date: 10/16/2022   Pt will be independent with HEP in order to improve strength and balance in order to decrease fall risk and improve function at home and work. Baseline: NT  Goal status: ONGOING    2.  Pt will increase by at least 0.13 m/s in order to demonstrate clinically significant improvement in community ambulation.  Baseline:  Goal status: ONGOING    3. Pt will increase by at least 59m (181ft) in order to demonstrate clinically significant improvement in cardiopulmonary endurance and community ambulation Baseline: 1,175 ft  Goal status: ONGOING      LONG TERM GOALS: Target date: 12/11/2022   Patient will have improved function and activity level as evidenced by an increase in FOTO score by 10 points or more.  Baseline: 49/100 with target of 55 Goal status: ONGOING    2.  Patient will perform five sit to stand repetitions in <=15 secs as evidence of LE strength that shows that this community dwelling older adult that is older is at a decreased risk of falling. (Buatois 2010)   Baseline: 0 reps  Goal status: ONGOING    3.  Patient will perform >=10 sit to stands from 18 inch chair height as evidence of LE endurance that shows she is above  cut-off for age and gender  matched norms and that indicate she is at a decreased risk of falling.  Baseline: 0 reps (must use hands) Goal status: ONGOING    4.  Patient will demonstrate reduced falls risk as evidenced by Dynamic Gait Index (DGI) >19/24. Baseline: 17/24 Goal status: ONGOING    5.  Patient will perform 10 mWT for normal gait speed at >=1.0 m/sec as an indication that she is above cut-off for fall risks.  Baseline: 0.98 m/sec Goal status: ONGOING    6. Patient will ambulate >=1000 ft in as evidence that she has sufficient aerobic endurance to be a community ambulator in order to complete tasks like shopping required to care for her husband.  Baseline: 1,175 ft  Goal status: ONGOING    PLAN:   PT FREQUENCY: 1-2x/week   PT DURATION: 10 weeks   PLANNED INTERVENTIONS: Therapeutic exercises, Therapeutic activity, Neuromuscular re-education, Balance training, Gait training, Patient/Family education, Joint mobilization, Joint manipulation, Stair training, DME instructions, Aquatic Therapy, Dry Needling, Electrical stimulation, Spinal manipulation, Spinal mobilization, Cryotherapy, Moist heat, Manual therapy, and Re-evaluation   PLAN FOR NEXT SESSION: Right hip flexor stretch. Continue isometric strengthening with mix in dynamic balance exercises especially turning and stopping.    Maylon Peppers, PT, DPT Physical Therapist - [place]  [facility]  10/16/2022, 8:11 AM

## 2022-10-16 ENCOUNTER — Encounter: Payer: Self-pay | Admitting: Physical Therapy

## 2022-10-16 ENCOUNTER — Ambulatory Visit: Payer: Medicare Other

## 2022-10-16 DIAGNOSIS — R296 Repeated falls: Secondary | ICD-10-CM | POA: Diagnosis not present

## 2022-10-16 DIAGNOSIS — R29898 Other symptoms and signs involving the musculoskeletal system: Secondary | ICD-10-CM

## 2022-10-21 ENCOUNTER — Encounter: Payer: Medicare Other | Admitting: Physical Therapy

## 2022-10-24 ENCOUNTER — Ambulatory Visit: Payer: Medicare Other | Attending: Family Medicine | Admitting: Physical Therapy

## 2022-10-24 DIAGNOSIS — R296 Repeated falls: Secondary | ICD-10-CM | POA: Insufficient documentation

## 2022-10-24 DIAGNOSIS — R29898 Other symptoms and signs involving the musculoskeletal system: Secondary | ICD-10-CM | POA: Insufficient documentation

## 2022-10-24 NOTE — Therapy (Addendum)
OUTPATIENT PHYSICAL THERAPY TREATMENT NOTE   Patient Name: Jennifer Frank MRN: [id number] DOB:1950-09-21, 72 y.o., female Today's Date: 10/24/2022  PCP: Dr. Crawford Givens  REFERRING PROVIDER: Dr. 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   Fundic gland polyps of stomach, benign    GERD (gastroesophageal reflux disease)    past hx    Hearing loss    IBS (irritable bowel syndrome)    Personal history of colonic polyps - adenoma 09/22/2013   PONV (postoperative nausea and vomiting)    Reflux    Past Surgical History:  Procedure Laterality Date   HIP ARTHROSCOPY Left 1997   KNEE ARTHROSCOPY Left 1996   KNEE ARTHROSCOPY Right    05-2016 same time as left knee replacement    POLYPECTOMY     TONSILLECTOMY AND ADENOIDECTOMY  1957   TOTAL KNEE ARTHROPLASTY Bilateral 05/27/2016   Procedure: LEFT TOTAL KNEE ARTHROPLASTY WITH RIGHT KNEE ARTHROSCOPY;  Surgeon: Gean Birchwood, MD;  Location: [facility] OR;  Service: Orthopedics;  Laterality: Bilateral;   UPPER GASTROINTESTINAL ENDOSCOPY     WISDOM TOOTH EXTRACTION     Patient Active Problem List   Diagnosis Date Noted   Tremor 09/08/2022   Osteopenia 02/11/2022   SOB (shortness of breath) 05/27/2021   Aortic atherosclerosis (HCC) 03/12/2020   Medicare annual wellness visit, subsequent 01/10/2019   Advance care planning 12/14/2017   Urinary incontinence 12/14/2017   Primary  osteoarthritis of left knee 05/26/2016   Health care maintenance 04/22/2016   Hearing loss 04/22/2014   Varicose veins of lower extremities with other complications 01/25/2013   Edema 01/25/2013   Hyperlipidemia 02/16/2007   GERD 02/16/2007    REFERRING DIAG: Repeated falls   Weakness of lower extremity, unspecified laterality  THERAPY DIAG:  Repeated falls  Weakness of lower extremity, unspecified laterality  Rationale for Evaluation and Treatment Rehabilitation  PERTINENT HISTORY: Pt reports having history of bilateral hip replacements and left knee replacement. She has recently been having bilateral swelling in her lower extremities and she attributes this to poor circulation in her legs. She has been prescribed lasix to reduce the swelling which has helped reduce edema in her legs. Prior to edema, she had fallen six times and once while reaching to grab chair and the chair sliding forward and her losing her balance. She is not sure why she is having trouble with balance, but she believes it is due to her LE weakness. Pt is unable to flex right hip without feeling increased right groin pain and she must lean back to compensate by backwards trunk lean. She has been referred to PT and has a neurology apt scheduled for June. She has noted tremors in her right  hand, arm, and leg. Her husband has COPD and he is unable to do many physical activities, so pt does physical activities such as mowing and gardening. She mostly loses her balance to her left side when walking.   PRECAUTIONS: Falls   SUBJECTIVE:                                                                                                                                                                                      SUBJECTIVE STATEMENT: Pt states that her hip felt much better after last session. During last session, she experienced a pop in her hip along with the some pain. Since then she has   PAIN:  Are you having pain?  No   OBJECTIVE: (objective measures completed at initial evaluation unless otherwise dated)  VITALS: BP 115/59 hr 70    DIAGNOSTIC FINDINGS: CLINICAL DATA:  Right hip pain after bending last night, no known injury   EXAM: DG HIP (WITH OR WITHOUT PELVIS) 2-3V RIGHT   COMPARISON:  None.   FINDINGS: Three views of the right hip submitted. No acute fracture or subluxation. There is bilateral hip prosthesis with anatomic alignment. Degenerative changes pubic symphysis. Pelvic phleboliths are noted. Calcification in right upper pelvis may be vascular in nature measures 6 mm. A distal right ureteral calculus cannot be entirely excluded.   IMPRESSION: There is bilateral hip prosthesis with anatomic alignment. Degenerative changes pubic symphysis. Pelvic phleboliths are noted. Calcification in right upper pelvis may be vascular in nature measures 6 mm. A distal right ureteral calculus cannot be entirely excluded.     Electronically Signed   By: Natasha Mead M.D.   On: 06/06/2015 09:48     PATIENT SURVEYS:  FOTO 49/100 with target of 55    COGNITION: Overall cognitive status: Within functional limits for tasks assessed                         SENSATION: WFL   EDEMA: Swelling in both ankles with redness.    MUSCLE LENGTH: Hamstrings: Right 70 deg; Left 70 deg Thomas test: + on RLE  Ober's test: NT     POSTURE: rounded shoulders   PALPATION: Right hip iliopsoas tendon TTP    LOWER EXTREMITY ROM:     Active  Right 10/02/2022 Left 10/02/2022  Hip flexion 120 120  Hip extension      Hip abduction      Hip adduction      Hip internal rotation      Hip external rotation      Knee flexion      Knee extension  Ankle dorsiflexion      Ankle plantarflexion      Ankle inversion      Ankle eversion       (Blank rows = not tested)        LOWER EXTREMITY MMT:   MMT Right eval Left eval  Hip flexion 3- 4-   Hip extension 4- 4-  Hip abduction 4- 4-  Hip  adduction 4- 4-  Hip internal rotation      Hip external rotation      Knee flexion 4+ 4+  Knee extension 4+ 4+  Ankle dorsiflexion 4+ 4+  Ankle plantarflexion NT  NT   Ankle inversion      Ankle eversion       (Blank rows = not tested)   LOWER EXTREMITY SPECIAL TESTS:  Hip special tests: Luisa Hart (FABER) test: positive , Ober's test: negative, Ely's test: positive , and FADIR + RLE   FUNCTIONAL TESTS:  5 times sit to stand: 0 reps (needs to use hands) 30 seconds chair stand test: 0 reps (needs to use hands) 6 minute walk test: 1,175 ft  10 meter walk test: 0.98 m/sec   Dynamic Gait Index: 17/24   GAIT: Distance walked: 50 ft  Assistive device utilized: None Level of assistance: Complete Independence Comments: Decreased step length bilaterally especially on RLE along with decreased right hip flexion      TODAY'S TREATMENT:                                                                                                                              DATE:   10/24/22: All exercises performed on RLE  Nu-Step with seat and arms at 9 with resistance at 2 for 5 min  Thomas Hip Flexor Stretch 3 x 30 sec  -Pt providing overpressure for increased hip flexion  -Added #5 AW for increased stretch  Seated Marches 1 x 5  -Unable to fully flex hip upwards and compensates with hamstring  Straight Leg Raise 3 x 5  -Pt reports feeling fatigue Supine Marches 1 x 3  -Pt unable to perform on RLE  Seated Right Hip Flexion with slight ER positioning for Sartorius Bias with 3 sec hold x 4  -Pt has difficulty positioning foot without sliding  Supine Right Hip Flexion with slight ER positioning for sartorius bias with 3 sec hold 1 x 5    10/16/22:  All exercises performed on R LE  Nustep seat 13, level 2 resistance x 5 minutes, slow warm up (up to 30 spm) Supine SLR x 2 on R with increased pain, unable to continue Supine quad sets 2 x 10 with 3 second hold, minimal discomfort  Hooklying hip  adduction ball squeeze 3 x 10, minimal discomfort Hooklying isometric hip abduction against therapist hand 3 x 10, no discomfort Hooklying marching x 5, increased pain on R, unable to continue Hooklying isometric hip flexion against therapist hand 3 x 10, minimal discomfort  Ober's test: negative bilaterally  Reflexes: 1+ bilaterally    10/09/22: : 1,175 ft  10 Meter Walk Test   Gait Speed:    1st trial 9.66 sec , 2nd trial 10.66 , Avg= 10.16sec - Normal Gait speed Avg=  0.98 m/sec   Fast Gait Speed: 1st trial 7.23 sec, 2nd 8.05  sec, Avg=  1.31 sec -Fast Gait speed time= m/sec   -> 1 m/sec [employer] and cross street safely and normal WS <1 m/sec Need Intervention to reduce falls risk  0.12 m/sec improvement represents a statistically significant improvement in gait speed    - 0.8 - 1.3 m/sec - community ambulator - associated with increased independence in self-care  DGI: 17/24 Mild Impairment: Head turns, obstacle  Moderate Impairment: Gait and pivot turn, stairs <19/24 indicates pt is at risk for falls  RAMS: Negative bilateral  Thomas Test: + RLE  Modified Thomas Stretch 3 x 30 sec   10/02/22  HS stretch 3 x 30 sec  Prone Quad Stretch 3 x 30 sec  Sit to Stand with 1 UE support 1 x 10  -min VC for fast stand along with slow eccentric lowering    PATIENT EDUCATION:  Education details: for correct performance of exercise  Person educated: Patient Education method: Programmer, multimedia, Demonstration, Verbal cues, and Handouts Education comprehension: verbalized understanding, returned demonstration, and verbal cues required   HOME EXERCISE PROGRAM: Access Code: [email or web address] URL: [email or web address] Date: 10/24/2022 Prepared by: Ellin Goodie  Exercises - Prone Quadriceps Stretch with Strap  - 1 x daily - 3 reps - 60 sec hold - Seated Hamstring Stretch  - 1 x daily - 3 reps - 60 sec  hold - Modified Thomas Stretch  - 1 x daily - 3 reps - 30  hold - Supine Active Straight Leg Raise  - 3-4 x weekly - 3 sets - 5 reps - Hooklying Isometric Hip Flexion  - 3-4 x weekly - 3 sets - 10 reps - 3 sec  hold   ASSESSMENT:   CLINICAL IMPRESSION:  Pt continues to show decreased left hip flexion strength especially with flexed knee indicating weak iliopsoas and compensation with activation of rectus femoris. She does demonstrate improvement in hip flexor strength with ability to perform straight leg raise. However, her inability to perform standing marches indicates ongoing iliopsoas and sartorius weakness. Concern here for possible iliopsoas tendon injury given increased pain with weakness and absence of neurological symptoms. PT concerned about possible medical pathologies that supersede MSK deficits. PT will reach out to referring PCP to request further medical eval and treatment to determine underlying pathology.      OBJECTIVE IMPAIRMENTS: Abnormal gait, decreased balance, decreased endurance, difficulty walking, decreased ROM, decreased strength, impaired flexibility, and pain.    ACTIVITY LIMITATIONS: carrying, lifting, bending, standing, squatting, stairs, transfers, and caring for others   PARTICIPATION LIMITATIONS: cleaning, laundry, shopping, community activity, and yard work   PERSONAL FACTORS: Age, Fitness, Past/current experiences, Time since onset of injury/illness/exacerbation, and 1-2 comorbidities: bilateral hip replacements and left TKA along with bilateral swelling with intermittent increased edema are also affecting patient's functional outcome.    REHAB POTENTIAL: Good   CLINICAL DECISION MAKING: Stable/uncomplicated   EVALUATION COMPLEXITY: Low     GOALS: Goals reviewed with patient? No   SHORT TERM GOALS: Target date: 10/16/2022   Pt will be independent with HEP in order to improve strength and balance in order to decrease fall risk and improve function at home and work. Baseline: NT  Goal status: ONGOING    2.   Pt will increase by at least 0.13 m/s in order to demonstrate clinically significant improvement in community ambulation.  Baseline:  Goal status: ONGOING    3. Pt will increase by at least 24m (131ft) in order to demonstrate clinically significant improvement in cardiopulmonary endurance and community ambulation Baseline: 1,175 ft  Goal status: ONGOING      LONG TERM GOALS: Target date: 12/11/2022   Patient will have improved function and activity level as evidenced by an increase in FOTO score by 10 points or more.  Baseline: 49/100 with target of 55 Goal status: ONGOING    2.  Patient will perform five sit to stand repetitions in <=15 secs as evidence of LE strength that shows that this community dwelling older adult that is older is at a decreased risk of falling. (Buatois 2010)   Baseline: 0 reps  Goal status: ONGOING    3.  Patient will perform >=10 sit to stands from 18 inch chair height as evidence of LE endurance that shows she is above cut-off for age and gender matched norms and that indicate she is at a decreased risk of falling.  Baseline: 0 reps (must use hands) Goal status: ONGOING    4.  Patient will demonstrate reduced falls risk as evidenced by Dynamic Gait Index (DGI) >19/24. Baseline: 17/24 Goal status: ONGOING    5.  Patient will perform 10 mWT for normal gait speed at >=1.0 m/sec as an indication that she is above cut-off for fall risks.  Baseline: 0.98 m/sec Goal status: ONGOING    6. Patient will ambulate >=1000 ft in as evidence that she has sufficient aerobic endurance to be a community ambulator in order to complete tasks like shopping required to care for her husband.  Baseline: 1,175 ft  Goal status: ONGOING    PLAN:   PT FREQUENCY: 1-2x/week   PT DURATION: 10 weeks   PLANNED INTERVENTIONS: Therapeutic exercises, Therapeutic activity, Neuromuscular re-education, Balance training, Gait training, Patient/Family education, Joint  mobilization, Joint manipulation, Stair training, DME instructions, Aquatic Therapy, Dry Needling, Electrical stimulation, Spinal manipulation, Spinal mobilization, Cryotherapy, Moist heat, Manual therapy, and Re-evaluation   PLAN FOR NEXT SESSION:  Gait Analysis for exam of increased toe drag on left foot and decreased hip flex on right. Continue to progress hip flex isometric and marches with flexed knee. Introduce hip abduction exercises   Ellin Goodie PT, DPT  Physical Therapist - [facility]  10/24/2022, 1:19 PM

## 2022-10-28 ENCOUNTER — Encounter: Payer: Medicare Other | Admitting: Physical Therapy

## 2022-10-29 ENCOUNTER — Encounter: Payer: Self-pay | Admitting: Physical Therapy

## 2022-10-29 ENCOUNTER — Ambulatory Visit: Payer: Medicare Other | Admitting: Physical Therapy

## 2022-10-29 DIAGNOSIS — R296 Repeated falls: Secondary | ICD-10-CM | POA: Diagnosis not present

## 2022-10-29 DIAGNOSIS — R29898 Other symptoms and signs involving the musculoskeletal system: Secondary | ICD-10-CM

## 2022-10-29 NOTE — Therapy (Addendum)
OUTPATIENT PHYSICAL THERAPY TREATMENT NOTE   Patient Name: Jennifer Frank MRN: [id number] DOB:1951/04/06, 72 y.o., female Today's Date: 10/29/2022  PCP: Dr. Crawford Givens  REFERRING PROVIDER: Dr. 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   Fundic gland polyps of stomach, benign    GERD (gastroesophageal reflux disease)    past hx    Hearing loss    IBS (irritable bowel syndrome)    Personal history of colonic polyps - adenoma 09/22/2013   PONV (postoperative nausea and vomiting)    Reflux    Past Surgical History:  Procedure Laterality Date   HIP ARTHROSCOPY Left 1997   KNEE ARTHROSCOPY Left 1996   KNEE ARTHROSCOPY Right    05-2016 same time as left knee replacement    POLYPECTOMY     TONSILLECTOMY AND ADENOIDECTOMY  1957   TOTAL KNEE ARTHROPLASTY Bilateral 05/27/2016   Procedure: LEFT TOTAL KNEE ARTHROPLASTY WITH RIGHT KNEE ARTHROSCOPY;  Surgeon: Gean Birchwood, MD;  Location: [facility] OR;  Service: Orthopedics;  Laterality: Bilateral;   UPPER GASTROINTESTINAL ENDOSCOPY     WISDOM TOOTH EXTRACTION     Patient Active Problem List   Diagnosis Date Noted   Tremor 09/08/2022   Osteopenia 02/11/2022   SOB (shortness of breath) 05/27/2021   Aortic atherosclerosis (HCC) 03/12/2020   Medicare annual wellness visit, subsequent 01/10/2019   Advance care planning 12/14/2017   Urinary  incontinence 12/14/2017   Primary osteoarthritis of left knee 05/26/2016   Health care maintenance 04/22/2016   Hearing loss 04/22/2014   Varicose veins of lower extremities with other complications 01/25/2013   Edema 01/25/2013   Hyperlipidemia 02/16/2007   GERD 02/16/2007    REFERRING DIAG: Repeated falls   Weakness of lower extremity, unspecified laterality  THERAPY DIAG:  Repeated falls  Weakness of lower extremity, unspecified laterality  Rationale for Evaluation and Treatment Rehabilitation  PERTINENT HISTORY: Pt reports having history of bilateral hip replacements and left knee replacement. She has recently been having bilateral swelling in her lower extremities and she attributes this to poor circulation in her legs. She has been prescribed lasix to reduce the swelling which has helped reduce edema in her legs. Prior to edema, she had fallen six times and once while reaching to grab chair and the chair sliding forward and her losing her balance. She is not sure why she is having trouble with balance, but she believes it is due to her LE weakness. Pt is unable to flex right hip without feeling increased right groin pain and she must lean back to compensate by backwards trunk lean. She has been referred to PT and has a neurology apt scheduled for June.  She has noted tremors in her right hand, arm, and leg. Her husband has COPD and he is unable to do many physical activities, so pt does physical activities such as mowing and gardening. She mostly loses her balance to her left side when walking.   PRECAUTIONS: Falls   SUBJECTIVE:                                                                                                                                                                                      SUBJECTIVE STATEMENT: Pt reports that she is feeling some pain and discomfort with some of her exercises. She describes it has painful and pulling especially when performing seated  hip external rotation stretch.   PAIN:  Are you having pain? No   OBJECTIVE: (objective measures completed at initial evaluation unless otherwise dated)  VITALS: BP 115/59 hr 70    DIAGNOSTIC FINDINGS: CLINICAL DATA:  Right hip pain after bending last night, no known injury   EXAM: DG HIP (WITH OR WITHOUT PELVIS) 2-3V RIGHT   COMPARISON:  None.   FINDINGS: Three views of the right hip submitted. No acute fracture or subluxation. There is bilateral hip prosthesis with anatomic alignment. Degenerative changes pubic symphysis. Pelvic phleboliths are noted. Calcification in right upper pelvis may be vascular in nature measures 6 mm. A distal right ureteral calculus cannot be entirely excluded.   IMPRESSION: There is bilateral hip prosthesis with anatomic alignment. Degenerative changes pubic symphysis. Pelvic phleboliths are noted. Calcification in right upper pelvis may be vascular in nature measures 6 mm. A distal right ureteral calculus cannot be entirely excluded.     Electronically Signed   By: Natasha Mead M.D.   On: 06/06/2015 09:48     PATIENT SURVEYS:  FOTO 49/100 with target of 55    COGNITION: Overall cognitive status: Within functional limits for tasks assessed                         SENSATION: WFL   EDEMA: Swelling in both ankles with redness.    MUSCLE LENGTH: Hamstrings: Right 70 deg; Left 70 deg Thomas test: + on RLE  Ober's test: NT     POSTURE: rounded shoulders   PALPATION: Right hip iliopsoas tendon TTP    LOWER EXTREMITY ROM:     Active  Right 10/02/2022 Left 10/02/2022  Hip flexion 120 120  Hip extension      Hip abduction      Hip adduction      Hip internal rotation      Hip external rotation      Knee flexion  Knee extension      Ankle dorsiflexion      Ankle plantarflexion      Ankle inversion      Ankle eversion       (Blank rows = not tested)        LOWER EXTREMITY MMT:   MMT Right eval Left eval  Hip  flexion 3- 4-   Hip extension 4- 4-  Hip abduction 4- 4-  Hip adduction 4- 4-  Hip internal rotation      Hip external rotation      Knee flexion 4+ 4+  Knee extension 4+ 4+  Ankle dorsiflexion 4+ 4+  Ankle plantarflexion NT  NT   Ankle inversion      Ankle eversion       (Blank rows = not tested)   LOWER EXTREMITY SPECIAL TESTS:  Hip special tests: Luisa Hart (FABER) test: positive , Ober's test: negative, Ely's test: positive , and FADIR + RLE   FUNCTIONAL TESTS:  5 times sit to stand: 0 reps (needs to use hands) 30 seconds chair stand test: 0 reps (needs to use hands) 6 minute walk test: 1,175 ft  10 meter walk test: 0.98 m/sec   Dynamic Gait Index: 17/24   GAIT: Distance walked: 50 ft  Assistive device utilized: None Level of assistance: Complete Independence Comments: Decreased step length bilaterally especially on RLE along with decreased right hip flexion      TODAY'S TREATMENT:                                                                                                                              DATE:   10/28/22: All exercises performed on RLE  Nu-Step with seat and arms at 9 with resistance at 2 for 5 min  Modified Thomas Stretch 2 x 30 sec Modified Thomas Stretch with strap for quad stretch 2 x 30 sec  Side Lying Hip Abduction 3 x 10  -min VC to avoid hip rotation  Supine Marches on RLE 1 x 10  -Pt reports increased pain in right hip  Straight Leg Raise 1 x 5  - Pt reports increased pain in right hip  Supine Marches with 1 ft heel elevation using step 1 x 10 -Pt reports some discomfort but less pain then without step  Supine hip flexion isometric with 3 sec hold with 1 ft step 1 x 5  FADIR: Negative RLE   10/24/22: All exercises performed on RLE  Nu-Step with seat and arms at 9 with resistance at 2 for 5 min  Thomas Hip Flexor Stretch 3 x 30 sec  -Pt providing overpressure for increased hip flexion  -Added #5 AW for increased stretch  Seated Marches 1 x  5  -Unable to fully flex hip upwards and compensates with hamstring  Straight Leg Raise 3 x 5  -Pt reports feeling fatigue Supine Marches 1 x 3  -Pt unable to perform on RLE  Seated Right  Hip Flexion with slight ER positioning for Sartorius Bias with 3 sec hold x 4  -Pt has difficulty positioning foot without sliding  Supine Right Hip Flexion with slight ER positioning for sartorius bias with 3 sec hold 1 x 5    10/16/22:  All exercises performed on R LE  Nustep seat 13, level 2 resistance x 5 minutes, slow warm up (up to 30 spm) Supine SLR x 2 on R with increased pain, unable to continue Supine quad sets 2 x 10 with 3 second hold, minimal discomfort  Hooklying hip adduction ball squeeze 3 x 10, minimal discomfort Hooklying isometric hip abduction against therapist hand 3 x 10, no discomfort Hooklying marching x 5, increased pain on R, unable to continue Hooklying isometric hip flexion against therapist hand 3 x 10, minimal discomfort   Ober's test: negative bilaterally  Reflexes: 1+ bilaterally    10/09/22: : 1,175 ft  10 Meter Walk Test   Gait Speed:    1st trial 9.66 sec , 2nd trial 10.66 , Avg= 10.16sec - Normal Gait speed Avg=  0.98 m/sec   Fast Gait Speed: 1st trial 7.23 sec, 2nd 8.05  sec, Avg=  1.31 sec -Fast Gait speed time= m/sec   -> 1 m/sec [employer] and cross street safely and normal WS <1 m/sec Need Intervention to reduce falls risk  0.12 m/sec improvement represents a statistically significant improvement in gait speed    - 0.8 - 1.3 m/sec - community ambulator - associated with increased independence in self-care  DGI: 17/24 Mild Impairment: Head turns, obstacle  Moderate Impairment: Gait and pivot turn, stairs <19/24 indicates pt is at risk for falls  RAMS: Negative bilateral  Thomas Test: + RLE  Modified Thomas Stretch 3 x 30 sec   10/02/22  HS stretch 3 x 30 sec  Prone Quad Stretch 3 x 30 sec  Sit to Stand with 1 UE support 1 x 10   -min VC for fast stand along with slow eccentric lowering    PATIENT EDUCATION:  Education details: for correct performance of exercise  Person educated: Patient Education method: Programmer, multimedia, Demonstration, Verbal cues, and Handouts Education comprehension: verbalized understanding, returned demonstration, and verbal cues required   HOME EXERCISE PROGRAM: Access Code: [email or web address] URL: [email or web address] Date: 10/29/2022 Prepared by: Ellin Goodie  Exercises - Prone Quadriceps Stretch with Strap  - 1 x daily - 3 reps - 60 sec hold - Seated Hamstring Stretch  - 1 x daily - 3 reps - 60 sec  hold - Modified Thomas Stretch  - 1 x daily - 3 reps - 30 hold - Hooklying Isometric Hip Flexion  - 3-4 x weekly - 3 sets - 10 reps - 3 sec  hold - Supine March  - 3-4 x weekly - 3 sets - 10 reps   ASSESSMENT:   CLINICAL IMPRESSION:  Pt continues to show increased right hip pain with AROM especially with extended knee indicating possible ilopsoas pathology. PT already reached out to PCP requesting referral to orthopedics and further imaging to determine underlying cause of pt's pain. Modified some of the exercises to include step under heel to assist patient in lifting RLE off of mat without excessive pain. Pt was able to perform marches and hip flexion isometric with more ease and less pain.   OBJECTIVE IMPAIRMENTS: Abnormal gait, decreased balance, decreased endurance, difficulty walking, decreased ROM, decreased strength, impaired flexibility, and pain.    ACTIVITY LIMITATIONS: carrying, lifting, bending, standing, squatting, stairs, transfers, and caring  for others   PARTICIPATION LIMITATIONS: cleaning, laundry, shopping, community activity, and yard work   PERSONAL FACTORS: Age, Fitness, Past/current experiences, Time since onset of injury/illness/exacerbation, and 1-2 comorbidities: bilateral hip replacements and left TKA along with bilateral swelling with intermittent  increased edema are also affecting patient's functional outcome.    REHAB POTENTIAL: Good   CLINICAL DECISION MAKING: Stable/uncomplicated   EVALUATION COMPLEXITY: Low     GOALS: Goals reviewed with patient? No   SHORT TERM GOALS: Target date: 10/16/2022   Pt will be independent with HEP in order to improve strength and balance in order to decrease fall risk and improve function at home and work. Baseline: NT  Goal status: ONGOING    2.  Pt will increase by at least 0.13 m/s in order to demonstrate clinically significant improvement in community ambulation.  Baseline:  Goal status: ONGOING    3. Pt will increase by at least 28m (164ft) in order to demonstrate clinically significant improvement in cardiopulmonary endurance and community ambulation Baseline: 1,175 ft  Goal status: ONGOING      LONG TERM GOALS: Target date: 12/11/2022   Patient will have improved function and activity level as evidenced by an increase in FOTO score by 10 points or more.  Baseline: 49/100 with target of 55 Goal status: ONGOING    2.  Patient will perform five sit to stand repetitions in <=15 secs as evidence of LE strength that shows that this community dwelling older adult that is older is at a decreased risk of falling. (Buatois 2010)   Baseline: 0 reps  Goal status: ONGOING    3.  Patient will perform >=10 sit to stands from 18 inch chair height as evidence of LE endurance that shows she is above cut-off for age and gender matched norms and that indicate she is at a decreased risk of falling.  Baseline: 0 reps (must use hands) Goal status: ONGOING    4.  Patient will demonstrate reduced falls risk as evidenced by Dynamic Gait Index (DGI) >19/24. Baseline: 17/24 Goal status: ONGOING    5.  Patient will perform 10 mWT for normal gait speed at >=1.0 m/sec as an indication that she is above cut-off for fall risks.  Baseline: 0.98 m/sec Goal status: ONGOING    6. Patient will  ambulate >=1000 ft in as evidence that she has sufficient aerobic endurance to be a community ambulator in order to complete tasks like shopping required to care for her husband.  Baseline: 1,175 ft  Goal status: ONGOING    PLAN:   PT FREQUENCY: 1-2x/week   PT DURATION: 10 weeks   PLANNED INTERVENTIONS: Therapeutic exercises, Therapeutic activity, Neuromuscular re-education, Balance training, Gait training, Patient/Family education, Joint mobilization, Joint manipulation, Stair training, DME instructions, Aquatic Therapy, Dry Needling, Electrical stimulation, Spinal manipulation, Spinal mobilization, Cryotherapy, Moist heat, Manual therapy, and Re-evaluation   PLAN FOR NEXT SESSION:  FOTO and reassess other goals. Gait Analysis for exam of increased toe drag on left foot and decreased hip flex on right. Continue to progress hip flex isometric and marches with flexed knee. Introduce hip abduction exercises   Ellin Goodie PT, DPT  Physical Therapist - [facility]  [facility]  10/29/2022, 8:27 AM

## 2022-10-30 ENCOUNTER — Encounter: Payer: Self-pay | Admitting: Family Medicine

## 2022-10-31 ENCOUNTER — Encounter: Payer: Self-pay | Admitting: Physical Therapy

## 2022-10-31 ENCOUNTER — Ambulatory Visit: Payer: Medicare Other | Admitting: Physical Therapy

## 2022-10-31 DIAGNOSIS — R29898 Other symptoms and signs involving the musculoskeletal system: Secondary | ICD-10-CM

## 2022-10-31 DIAGNOSIS — R296 Repeated falls: Secondary | ICD-10-CM

## 2022-10-31 NOTE — Therapy (Signed)
OUTPATIENT PHYSICAL THERAPY TREATMENT NOTE   Patient Name: Jennifer Frank MRN: [id number] DOB:15-Jul-1950, 72 y.o., female Today's Date: 10/31/2022  PCP: Dr. Crawford Givens  REFERRING PROVIDER: Dr. 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   Fundic gland polyps of stomach, benign    GERD (gastroesophageal reflux disease)    past hx    Hearing loss    IBS (irritable bowel syndrome)    Personal history of colonic polyps - adenoma 09/22/2013   PONV (postoperative nausea and vomiting)    Reflux    Past Surgical History:  Procedure Laterality Date   HIP ARTHROSCOPY Left 1997   KNEE ARTHROSCOPY Left 1996   KNEE ARTHROSCOPY Right    05-2016 same time as left knee replacement    POLYPECTOMY     TONSILLECTOMY AND ADENOIDECTOMY  1957   TOTAL KNEE ARTHROPLASTY Bilateral 05/27/2016   Procedure: LEFT TOTAL KNEE ARTHROPLASTY WITH RIGHT KNEE ARTHROSCOPY;  Surgeon: Gean Birchwood, MD;  Location: [facility] OR;  Service: Orthopedics;  Laterality: Bilateral;   UPPER GASTROINTESTINAL ENDOSCOPY     WISDOM TOOTH EXTRACTION     Patient Active Problem List   Diagnosis Date Noted   Tremor 09/08/2022   Osteopenia 02/11/2022   SOB (shortness of breath) 05/27/2021   Aortic atherosclerosis (HCC) 03/12/2020   Medicare annual wellness visit, subsequent 01/10/2019   Advance care planning 12/14/2017   Urinary  incontinence 12/14/2017   Primary osteoarthritis of left knee 05/26/2016   Health care maintenance 04/22/2016   Hearing loss 04/22/2014   Varicose veins of lower extremities with other complications 01/25/2013   Edema 01/25/2013   Hyperlipidemia 02/16/2007   GERD 02/16/2007    REFERRING DIAG: Repeated falls   Weakness of lower extremity, unspecified laterality  THERAPY DIAG:  Repeated falls  Weakness of lower extremity, unspecified laterality  Rationale for Evaluation and Treatment Rehabilitation  PERTINENT HISTORY: Pt reports having history of bilateral hip replacements and left knee replacement. She has recently been having bilateral swelling in her lower extremities and she attributes this to poor circulation in her legs. She has been prescribed lasix to reduce the swelling which has helped reduce edema in her legs. Prior to edema, she had fallen six times and once while reaching to grab chair and the chair sliding forward and her losing her balance. She is not sure why she is having trouble with balance, but she believes it is due to her LE weakness. Pt is unable to flex right hip without feeling increased right groin pain and she must lean back to compensate by backwards trunk lean. She has been referred to PT and has a neurology apt scheduled for June.  She has noted tremors in her right hand, arm, and leg. Her husband has COPD and he is unable to do many physical activities, so pt does physical activities such as mowing and gardening. She mostly loses her balance to her left side when walking.   PRECAUTIONS: Falls   SUBJECTIVE:                                                                                                                                                                                      SUBJECTIVE STATEMENT: Pt states that her PCP's office reached out to her saying that they did not hear anything from her physical therapist despite PT routing note to PCP. Pt  reports that she started feeling right hip weakness after right    PAIN:  Are you having pain? No   OBJECTIVE: (objective measures completed at initial evaluation unless otherwise dated)  VITALS: BP 115/59 hr 70    DIAGNOSTIC FINDINGS: CLINICAL DATA:  Right hip pain after bending last night, no known injury   EXAM: DG HIP (WITH OR WITHOUT PELVIS) 2-3V RIGHT   COMPARISON:  None.   FINDINGS: Three views of the right hip submitted. No acute fracture or subluxation. There is bilateral hip prosthesis with anatomic alignment. Degenerative changes pubic symphysis. Pelvic phleboliths are noted. Calcification in right upper pelvis may be vascular in nature measures 6 mm. A distal right ureteral calculus cannot be entirely excluded.   IMPRESSION: There is bilateral hip prosthesis with anatomic alignment. Degenerative changes pubic symphysis. Pelvic phleboliths are noted. Calcification in right upper pelvis may be vascular in nature measures 6 mm. A distal right ureteral calculus cannot be entirely excluded.     Electronically Signed   By: Natasha Mead M.D.   On: 06/06/2015 09:48     PATIENT SURVEYS:  FOTO 49/100 with target of 55    COGNITION: Overall cognitive status: Within functional limits for tasks assessed                         SENSATION: WFL   EDEMA: Swelling in both ankles with redness.    MUSCLE LENGTH: Hamstrings: Right 70 deg; Left 70 deg Thomas test: + on RLE  Ober's test: NT     POSTURE: rounded shoulders   PALPATION: Right hip iliopsoas tendon TTP    LOWER EXTREMITY ROM:     Active  Right 10/02/2022 Left 10/02/2022  Hip flexion 120 120  Hip extension      Hip abduction      Hip adduction      Hip internal rotation      Hip external rotation  Knee flexion      Knee extension      Ankle dorsiflexion      Ankle plantarflexion      Ankle inversion      Ankle eversion       (Blank rows = not tested)        LOWER EXTREMITY MMT:    MMT Right eval Left eval  Hip flexion 3- 4-   Hip extension 4- 4-  Hip abduction 4- 4-  Hip adduction 4- 4-  Hip internal rotation      Hip external rotation      Knee flexion 4+ 4+  Knee extension 4+ 4+  Ankle dorsiflexion 4+ 4+  Ankle plantarflexion NT  NT   Ankle inversion      Ankle eversion       (Blank rows = not tested)   LOWER EXTREMITY SPECIAL TESTS:  Hip special tests: Luisa Hart (FABER) test: positive , Ober's test: negative, Ely's test: positive , and FADIR + RLE   FUNCTIONAL TESTS:  5 times sit to stand: 0 reps (needs to use hands) 30 seconds chair stand test: 0 reps (needs to use hands) 6 minute walk test: 1,175 ft  10 meter walk test: 0.98 m/sec   Dynamic Gait Index: 17/24   GAIT: Distance walked: 50 ft  Assistive device utilized: None Level of assistance: Complete Independence Comments: Decreased step length bilaterally especially on RLE along with decreased right hip flexion      TODAY'S TREATMENT:                                                                                                                              DATE:   10/31/22: All single leg exercises performed on RLE  : 1,150 ft  MATRIX Hip Flex #35  1 x 5  Gait Analysis: Decreased step length on LLE and decreased stance time on RLE.  Rollator: 5 x 10 m  -Pt shows increased stride length and left foot drag  Quad Cane:  -Pt shows increased stride length but left foot drag  10/28/22: All exercises performed on RLE  Nu-Step with seat and arms at 9 with resistance at 2 for 5 min  Modified Thomas Stretch 2 x 30 sec Modified Thomas Stretch with strap for quad stretch 2 x 30 sec  Side Lying Hip Abduction 3 x 10  -min VC to avoid hip rotation  Supine Marches on RLE 1 x 10  -Pt reports increased pain in right hip  Straight Leg Raise 1 x 5  - Pt reports increased pain in right hip  Supine Marches with 1 ft heel elevation using step 1 x 10 -Pt reports some discomfort but less pain then  without step  Supine hip flexion isometric with 3 sec hold with 1 ft step 1 x 5  FADIR: Negative RLE   10/24/22: All exercises performed on RLE  Nu-Step with seat and arms at 9 with resistance at  2 for 5 min  Thomas Hip Flexor Stretch 3 x 30 sec  -Pt providing overpressure for increased hip flexion  -Added #5 AW for increased stretch  Seated Marches 1 x 5  -Unable to fully flex hip upwards and compensates with hamstring  Straight Leg Raise 3 x 5  -Pt reports feeling fatigue Supine Marches 1 x 3  -Pt unable to perform on RLE  Seated Right Hip Flexion with slight ER positioning for Sartorius Bias with 3 sec hold x 4  -Pt has difficulty positioning foot without sliding  Supine Right Hip Flexion with slight ER positioning for sartorius bias with 3 sec hold 1 x 5     PATIENT EDUCATION:  Education details: for correct performance of exercise  Person educated: Patient Education method: Programmer, multimedia, Demonstration, Verbal cues, and Handouts Education comprehension: verbalized understanding, returned demonstration, and verbal cues required   HOME EXERCISE PROGRAM: Access Code: [email or web address] URL: [email or web address] Date: 10/29/2022 Prepared by: Ellin Goodie  Exercises - Prone Quadriceps Stretch with Strap  - 1 x daily - 3 reps - 60 sec hold - Seated Hamstring Stretch  - 1 x daily - 3 reps - 60 sec  hold - Modified Thomas Stretch  - 1 x daily - 3 reps - 30 hold - Hooklying Isometric Hip Flexion  - 3-4 x weekly - 3 sets - 10 reps - 3 sec  hold - Supine March  - 3-4 x weekly - 3 sets - 10 reps   ASSESSMENT:   CLINICAL IMPRESSION:  Pt continues to be limited by left foot drag and right hip weakness. PT reiterates that these findings will be communicated with her PCP and that she will need further eval and treatment by orthopedist. PT recommended that pt utilize assistive device to decrease risk of falling. Next session to focus on use of assistive device particularly negotiate  curbs.    OBJECTIVE IMPAIRMENTS: Abnormal gait, decreased balance, decreased endurance, difficulty walking, decreased ROM, decreased strength, impaired flexibility, and pain.    ACTIVITY LIMITATIONS: carrying, lifting, bending, standing, squatting, stairs, transfers, and caring for others   PARTICIPATION LIMITATIONS: cleaning, laundry, shopping, community activity, and yard work   PERSONAL FACTORS: Age, Fitness, Past/current experiences, Time since onset of injury/illness/exacerbation, and 1-2 comorbidities: bilateral hip replacements and left TKA along with bilateral swelling with intermittent increased edema are also affecting patient's functional outcome.    REHAB POTENTIAL: Good   CLINICAL DECISION MAKING: Stable/uncomplicated   EVALUATION COMPLEXITY: Low     GOALS: Goals reviewed with patient? No   SHORT TERM GOALS: Target date: 10/16/2022   Pt will be independent with HEP in order to improve strength and balance in order to decrease fall risk and improve function at home and work. Baseline: NT  Goal status: ONGOING    2.  Pt will increase by at least 0.13 m/s in order to demonstrate clinically significant improvement in community ambulation.  Baseline:  Goal status: ONGOING    3. Pt will increase by at least 31m (161ft) in order to demonstrate clinically significant improvement in cardiopulmonary endurance and community ambulation Baseline: 1,175 ft  Goal status: ONGOING      LONG TERM GOALS: Target date: 12/11/2022   Patient will have improved function and activity level as evidenced by an increase in FOTO score by 10 points or more.  Baseline: 49/100 with target of 55 Goal status: ONGOING    2.  Patient will perform five sit to stand repetitions in <=15 secs as evidence  of LE strength that shows that this community dwelling older adult that is older is at a decreased risk of falling. (Buatois 2010)   Baseline: 0 reps  Goal status: ONGOING    3.  Patient  will perform >=10 sit to stands from 18 inch chair height as evidence of LE endurance that shows she is above cut-off for age and gender matched norms and that indicate she is at a decreased risk of falling.  Baseline: 0 reps (must use hands) Goal status: ONGOING    4.  Patient will demonstrate reduced falls risk as evidenced by Dynamic Gait Index (DGI) >19/24. Baseline: 17/24 Goal status: ONGOING    5.  Patient will perform 10 mWT for normal gait speed at >=1.0 m/sec as an indication that she is above cut-off for fall risks.  Baseline: 0.98 m/sec Goal status: ONGOING    6. Patient will ambulate >=1000 ft in as evidence that she has sufficient aerobic endurance to be a community ambulator in order to complete tasks like shopping required to care for her husband.  Baseline: 1,175 ft  10/31/22: 1,150 ft  Goal status: ONGOING    PLAN:   PT FREQUENCY: 1-2x/week   PT DURATION: 10 weeks   PLANNED INTERVENTIONS: Therapeutic exercises, Therapeutic activity, Neuromuscular re-education, Balance training, Gait training, Patient/Family education, Joint mobilization, Joint manipulation, Stair training, DME instructions, Aquatic Therapy, Dry Needling, Electrical stimulation, Spinal manipulation, Spinal mobilization, Cryotherapy, Moist heat, Manual therapy, and Re-evaluation   PLAN FOR NEXT SESSION:  Focus on assistive device use when negotiating curbs   Ellin Goodie PT, DPT  Physical Therapist - [facility]  [facility]  10/31/2022, 10:29 AM

## 2022-11-04 ENCOUNTER — Encounter: Payer: Self-pay | Admitting: Physical Therapy

## 2022-11-04 ENCOUNTER — Ambulatory Visit: Payer: Medicare Other | Admitting: Physical Therapy

## 2022-11-04 ENCOUNTER — Other Ambulatory Visit: Payer: Self-pay | Admitting: Family Medicine

## 2022-11-04 ENCOUNTER — Encounter: Payer: Self-pay | Admitting: Family Medicine

## 2022-11-04 DIAGNOSIS — R29898 Other symptoms and signs involving the musculoskeletal system: Secondary | ICD-10-CM | POA: Diagnosis not present

## 2022-11-04 DIAGNOSIS — R296 Repeated falls: Secondary | ICD-10-CM

## 2022-11-04 NOTE — Therapy (Addendum)
OUTPATIENT PHYSICAL THERAPY TREATMENT NOTE   Patient Name: Jennifer Frank MRN: [id number] DOB:09/30/1950, 72 y.o., female Today's Date: 11/04/2022  PCP: Dr. Crawford Givens  REFERRING PROVIDER: Dr. 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   Fundic gland polyps of stomach, benign    GERD (gastroesophageal reflux disease)    past hx    Hearing loss    IBS (irritable bowel syndrome)    Personal history of colonic polyps - adenoma 09/22/2013   PONV (postoperative nausea and vomiting)    Reflux    Past Surgical History:  Procedure Laterality Date   HIP ARTHROSCOPY Left 1997   KNEE ARTHROSCOPY Left 1996   KNEE ARTHROSCOPY Right    05-2016 same time as left knee replacement    POLYPECTOMY     TONSILLECTOMY AND ADENOIDECTOMY  1957   TOTAL KNEE ARTHROPLASTY Bilateral 05/27/2016   Procedure: LEFT TOTAL KNEE ARTHROPLASTY WITH RIGHT KNEE ARTHROSCOPY;  Surgeon: Gean Birchwood, MD;  Location: [facility] OR;  Service: Orthopedics;  Laterality: Bilateral;   UPPER GASTROINTESTINAL ENDOSCOPY     WISDOM TOOTH EXTRACTION     Patient Active Problem List   Diagnosis Date Noted   Tremor 09/08/2022   Osteopenia 02/11/2022   SOB (shortness of breath) 05/27/2021   Aortic atherosclerosis (HCC) 03/12/2020   Medicare annual wellness visit, subsequent 01/10/2019   Advance care planning 12/14/2017   Urinary  incontinence 12/14/2017   Primary osteoarthritis of left knee 05/26/2016   Health care maintenance 04/22/2016   Hearing loss 04/22/2014   Varicose veins of lower extremities with other complications 01/25/2013   Edema 01/25/2013   Hyperlipidemia 02/16/2007   GERD 02/16/2007    REFERRING DIAG: Repeated falls   Weakness of lower extremity, unspecified laterality  THERAPY DIAG:  Repeated falls  Weakness of lower extremity, unspecified laterality  Rationale for Evaluation and Treatment Rehabilitation  PERTINENT HISTORY: Pt reports having history of bilateral hip replacements and left knee replacement. She has recently been having bilateral swelling in her lower extremities and she attributes this to poor circulation in her legs. She has been prescribed lasix to reduce the swelling which has helped reduce edema in her legs. Prior to edema, she had fallen six times and once while reaching to grab chair and the chair sliding forward and her losing her balance. She is not sure why she is having trouble with balance, but she believes it is due to her LE weakness. Pt is unable to flex right hip without feeling increased right groin pain and she must lean back to compensate by backwards trunk lean. She has been referred to PT and has a neurology apt scheduled for  June. She has noted tremors in her right hand, arm, and leg. Her husband has COPD and he is unable to do many physical activities, so pt does physical activities such as mowing and gardening. She mostly loses her balance to her left side when walking.   PRECAUTIONS: Falls   SUBJECTIVE:                                                                                                                                                                                      SUBJECTIVE STATEMENT: Pt reports that she is still having unsteadiness and she needed help from family to get on and off boat. She intends to use her 2WW going forward to avoid  falls.   PAIN:  Are you having pain? No   OBJECTIVE: (objective measures completed at initial evaluation unless otherwise dated)  VITALS: BP 115/59 hr 70    DIAGNOSTIC FINDINGS: CLINICAL DATA:  Right hip pain after bending last night, no known injury   EXAM: DG HIP (WITH OR WITHOUT PELVIS) 2-3V RIGHT   COMPARISON:  None.   FINDINGS: Three views of the right hip submitted. No acute fracture or subluxation. There is bilateral hip prosthesis with anatomic alignment. Degenerative changes pubic symphysis. Pelvic phleboliths are noted. Calcification in right upper pelvis may be vascular in nature measures 6 mm. A distal right ureteral calculus cannot be entirely excluded.   IMPRESSION: There is bilateral hip prosthesis with anatomic alignment. Degenerative changes pubic symphysis. Pelvic phleboliths are noted. Calcification in right upper pelvis may be vascular in nature measures 6 mm. A distal right ureteral calculus cannot be entirely excluded.     Electronically Signed   By: Natasha Mead M.D.   On: 06/06/2015 09:48     PATIENT SURVEYS:  FOTO 49/100 with target of 55    COGNITION: Overall cognitive status: Within functional limits for tasks assessed                         SENSATION: WFL   EDEMA: Swelling in both ankles with redness.    MUSCLE LENGTH: Hamstrings: Right 70 deg; Left 70 deg Thomas test: + on RLE  Ober's test: NT     POSTURE: rounded shoulders   PALPATION: Right hip iliopsoas tendon TTP    LOWER EXTREMITY ROM:     Active  Right 10/02/2022 Left 10/02/2022  Hip flexion 120 120  Hip extension      Hip abduction      Hip adduction      Hip internal rotation      Hip external rotation      Knee flexion  Knee extension      Ankle dorsiflexion      Ankle plantarflexion      Ankle inversion      Ankle eversion       (Blank rows = not tested)        LOWER EXTREMITY MMT:   MMT Right eval Left eval  Hip flexion 3- 4-   Hip  extension 4- 4-  Hip abduction 4- 4-  Hip adduction 4- 4-  Hip internal rotation      Hip external rotation      Knee flexion 4+ 4+  Knee extension 4+ 4+  Ankle dorsiflexion 4+ 4+  Ankle plantarflexion NT  NT   Ankle inversion      Ankle eversion       (Blank rows = not tested)   LOWER EXTREMITY SPECIAL TESTS:  Hip special tests: Luisa Hart (FABER) test: positive , Ober's test: negative, Ely's test: positive , and FADIR + RLE   FUNCTIONAL TESTS:  5 times sit to stand: 0 reps (needs to use hands) 30 seconds chair stand test: 0 reps (needs to use hands) 6 minute walk test: 1,175 ft  10 meter walk test: 0.98 m/sec   Dynamic Gait Index: 17/24   GAIT: Distance walked: 50 ft  Assistive device utilized: None Level of assistance: Complete Independence Comments: Decreased step length bilaterally especially on RLE along with decreased right hip flexion      TODAY'S TREATMENT:                                                                                                                              DATE:   11/04/22:  2WW -min VC for negotiating curb with down with bad and up with good  Quad Cane  -min VC for negotiating curb with down with bad and up with good  Rollator  -min VC for negotiating curb with down with bad and up with good    10/31/22: All single leg exercises performed on RLE  : 1,150 ft  MATRIX Hip Flex #35  1 x 5  Gait Analysis: Decreased step length on LLE and decreased stance time on RLE.  Rollator: 5 x 10 m  -Pt shows increased stride length and left foot drag  Quad Cane:  -Pt shows increased stride length but left foot drag  10/28/22: All exercises performed on RLE  Nu-Step with seat and arms at 9 with resistance at 2 for 5 min  Modified Thomas Stretch 2 x 30 sec Modified Thomas Stretch with strap for quad stretch 2 x 30 sec  Side Lying Hip Abduction 3 x 10  -min VC to avoid hip rotation  Supine Marches on RLE 1 x 10  -Pt reports increased pain in  right hip  Straight Leg Raise 1 x 5  - Pt reports increased pain in right hip  Supine Marches with 1 ft heel elevation using step 1 x 10 -Pt reports some discomfort  but less pain then without step  Supine hip flexion isometric with 3 sec hold with 1 ft step 1 x 5  FADIR: Negative RLE   10/24/22: All exercises performed on RLE  Nu-Step with seat and arms at 9 with resistance at 2 for 5 min  Thomas Hip Flexor Stretch 3 x 30 sec  -Pt providing overpressure for increased hip flexion  -Added #5 AW for increased stretch  Seated Marches 1 x 5  -Unable to fully flex hip upwards and compensates with hamstring  Straight Leg Raise 3 x 5  -Pt reports feeling fatigue Supine Marches 1 x 3  -Pt unable to perform on RLE  Seated Right Hip Flexion with slight ER positioning for Sartorius Bias with 3 sec hold x 4  -Pt has difficulty positioning foot without sliding  Supine Right Hip Flexion with slight ER positioning for sartorius bias with 3 sec hold 1 x 5     PATIENT EDUCATION:  Education details: for correct performance of exercise  Person educated: Patient Education method: Programmer, multimedia, Demonstration, Verbal cues, and Handouts Education comprehension: verbalized understanding, returned demonstration, and verbal cues required   HOME EXERCISE PROGRAM: Access Code: [email or web address] URL: [email or web address] Date: 10/29/2022 Prepared by: Ellin Goodie  Exercises - Prone Quadriceps Stretch with Strap  - 1 x daily - 3 reps - 60 sec hold - Seated Hamstring Stretch  - 1 x daily - 3 reps - 60 sec  hold - Modified Thomas Stretch  - 1 x daily - 3 reps - 30 hold - Hooklying Isometric Hip Flexion  - 3-4 x weekly - 3 sets - 10 reps - 3 sec  hold - Supine March  - 3-4 x weekly - 3 sets - 10 reps   ASSESSMENT:   CLINICAL IMPRESSION:  Pt was able to safely demonstrate how to negotiate curbs with three different assistive devices with min VC. PT instructing pt to use 2WW for now while she is being  examined for cause of right hip weakness and to decrease her risk of falling. Pt is in agreement of this plan and she will reach out to PCP about getting referral to an orthopedist. PT to hold on future visits until pt has seen orthopedist for further eval and treatment.   OBJECTIVE IMPAIRMENTS: Abnormal gait, decreased balance, decreased endurance, difficulty walking, decreased ROM, decreased strength, impaired flexibility, and pain.    ACTIVITY LIMITATIONS: carrying, lifting, bending, standing, squatting, stairs, transfers, and caring for others   PARTICIPATION LIMITATIONS: cleaning, laundry, shopping, community activity, and yard work   PERSONAL FACTORS: Age, Fitness, Past/current experiences, Time since onset of injury/illness/exacerbation, and 1-2 comorbidities: bilateral hip replacements and left TKA along with bilateral swelling with intermittent increased edema are also affecting patient's functional outcome.    REHAB POTENTIAL: Good   CLINICAL DECISION MAKING: Stable/uncomplicated   EVALUATION COMPLEXITY: Low     GOALS: Goals reviewed with patient? No   SHORT TERM GOALS: Target date: 10/16/2022   Pt will be independent with HEP in order to improve strength and balance in order to decrease fall risk and improve function at home and work. Baseline: NT  Goal status: ONGOING    2.  Pt will increase by at least 0.13 m/s in order to demonstrate clinically significant improvement in community ambulation.  Baseline:  Goal status: ONGOING    3. Pt will increase by at least 48m (165ft) in order to demonstrate clinically significant improvement in cardiopulmonary endurance and community ambulation Baseline: 1,175 ft  Goal status: ONGOING      LONG TERM GOALS: Target date: 12/11/2022   Patient will have improved function and activity level as evidenced by an increase in FOTO score by 10 points or more.  Baseline: 49/100 with target of 55 Goal status: ONGOING    2.   Patient will perform five sit to stand repetitions in <=15 secs as evidence of LE strength that shows that this community dwelling older adult that is older is at a decreased risk of falling. (Buatois 2010)   Baseline: 0 reps  Goal status: ONGOING    3.  Patient will perform >=10 sit to stands from 18 inch chair height as evidence of LE endurance that shows she is above cut-off for age and gender matched norms and that indicate she is at a decreased risk of falling.  Baseline: 0 reps (must use hands) Goal status: ONGOING    4.  Patient will demonstrate reduced falls risk as evidenced by Dynamic Gait Index (DGI) >19/24. Baseline: 17/24 Goal status: ONGOING    5.  Patient will perform 10 mWT for normal gait speed at >=1.0 m/sec as an indication that she is above cut-off for fall risks.  Baseline: 0.98 m/sec Goal status: ONGOING    6. Patient will ambulate >=1000 ft in as evidence that she has sufficient aerobic endurance to be a community ambulator in order to complete tasks like shopping required to care for her husband.  Baseline: 1,175 ft  10/31/22: 1,150 ft  Goal status: ONGOING    PLAN:   PT FREQUENCY: 1-2x/week   PT DURATION: 10 weeks   PLANNED INTERVENTIONS: Therapeutic exercises, Therapeutic activity, Neuromuscular re-education, Balance training, Gait training, Patient/Family education, Joint mobilization, Joint manipulation, Stair training, DME instructions, Aquatic Therapy, Dry Needling, Electrical stimulation, Spinal manipulation, Spinal mobilization, Cryotherapy, Moist heat, Manual therapy, and Re-evaluation   PLAN FOR NEXT SESSION: Adjust apts to allow pt to see orthopedist first   Ellin Goodie PT, DPT  Physical Therapist - [facility]  11/04/2022, 11:33 AM

## 2022-11-06 ENCOUNTER — Other Ambulatory Visit: Payer: Self-pay | Admitting: Family Medicine

## 2022-11-06 ENCOUNTER — Telehealth: Payer: Self-pay | Admitting: Physical Therapy

## 2022-11-06 DIAGNOSIS — R29898 Other symptoms and signs involving the musculoskeletal system: Secondary | ICD-10-CM

## 2022-11-06 NOTE — Telephone Encounter (Signed)
Called pt to determine status of her scheduling with PCP for further eval and treatment of right hip. Pt has still not heard back from PCP despite several calls. PT will remove her from schedule for next week and instructed pt to call back once she hears back from PCP.

## 2022-11-07 ENCOUNTER — Other Ambulatory Visit: Payer: Medicare Other

## 2022-11-07 ENCOUNTER — Ambulatory Visit
Admission: RE | Admit: 2022-11-07 | Discharge: 2022-11-07 | Disposition: A | Discharge status: Home / Self Care | Payer: Medicare Other | Source: Ambulatory Visit | Attending: Family Medicine | Admitting: Family Medicine

## 2022-11-07 ENCOUNTER — Ambulatory Visit: Payer: Medicare Other | Admitting: Physical Therapy

## 2022-11-07 DIAGNOSIS — R2689 Other abnormalities of gait and mobility: Secondary | ICD-10-CM | POA: Diagnosis not present

## 2022-11-07 DIAGNOSIS — M7072 Other bursitis of hip, left hip: Secondary | ICD-10-CM | POA: Diagnosis not present

## 2022-11-07 DIAGNOSIS — R29898 Other symptoms and signs involving the musculoskeletal system: Secondary | ICD-10-CM

## 2022-11-07 NOTE — Progress Notes (Signed)
Assessment/Plan:   Tremor  -Reassured her that there was no evidence of Parkinsons disease.  -Did not see any leg tremor today.  -Tremor that was seen exhibited entrainment and was most characteristic of functional tremor.  This type of tremor is generally not effectively treated with medication, but I do not think patient really wants medicine anyway type of tremor.  This type of tremor can often derive from stress.  Counseling and OT can be valuable  Gait instability  -may have associated lumbar radiculopathy and ? PN  -we will do EMG  -we will do MRI lumbar spine  -she is already in PT.   Subjective:   Jennifer Frank was seen today in the movement disorders clinic for neurologic consultation at the request of Joaquim Nam, MD.  The consultation is for the evaluation of right hand and right foot tremor.  Pt with husband who supplements hx.    Tremor is a bit episodic but its multiple times per day.  It comes and goes.  It does have a rhythm to the tremor.    Tremor: Yes.     How long has it been going on? Started maybe 20 years ago in the R hand and maybe 1 year ago in the leg  At rest or with activation?  Mostly with activation but sometimes when watching tv; notes leg with standing too long or with driving on gas pedal  When is it noted the most?  Eating, writing  Fam hx of tremor?  Only paternal uncle with Parkinsons Disease   Located where?  R arm; leg  Affected by caffeine:  doesn't notice (drinks dr pepper zero)  Affected by alcohol:  doesn't drink it anymore  Affected by stress:  Yes.    Affected by fatigue:  with the leg - its worse with physical fatigue  Spills soup if on spoon:  "if the tremor comes, the soup would spill."  Affects ADL's (tying shoes, brushing teeth, etc):  it may bother brushing teeth   Other Specific Symptoms:  Voice: husband thinks she has gotten louder Sleep:   Vivid Dreams:  yes  Acting out dreams:  some per husband Wet Pillows: Yes.    Postural symptoms:  Yes.   - currently in PT for balance and PT thinks that this is a hip issue.  Just had pelvis MRI done but not hip MRI.  She does have lower back pain and takes ES tylenol for this  Falls?  Yes.  , last fall 2 weeks ago and was backwards but this was only fall backward.  Most of them were to the L.  6 falls over the last 2-3 months Bradykinesia symptoms: difficulty getting out of a chair; feels unsteady in the turn; no shuffle but sometimes will drag the L leg Loss of smell:  No. Loss of taste:  No. Urinary Incontinence:  Yes.  , wears undergarment due to urgency; used to lose bowel control but changed diet and that was better Difficulty Swallowing:  No. Handwriting, micrographia: No. N/V:   only if lays down with food/alcohol (stopped alcohol for this reason) due to GERD Lightheaded: occ when first gets up  Syncope: No., not for many years (Had that years ago when reached up to change a room deodorizer and passed out) Diplopia:  No. Dyskinesia:  No.  Neuroimaging of the brain has not previously been performed.   PREVIOUS MEDICATIONS: none to date  ALLERGIES:   Allergies  Allergen Reactions  Codeine     Nausea and vomiting.  She can tolerate tylenol.     Darvocet [Propoxyphene N-Acetaminophen] Nausea And Vomiting   Morphine And Codeine Nausea And Vomiting   Oxybutynin     GERD sx   Vioxx [Rofecoxib] Hives and Swelling    CURRENT MEDICATIONS:  Current Outpatient Medications  Medication Instructions   betamethasone valerate (VALISONE) 0.1 % cream APPLY A THIN LAYER TO THE AFFECTED AREA(S) BY TOPICAL ROUTE ONCE DAILY AS NEEDED   famotidine (PEPCID) 20 mg, Oral, Daily   furosemide (LASIX) 20 mg, Oral, Daily PRN   loratadine (CLARITIN) 10 mg, Oral, Daily   mirabegron ER (MYRBETRIQ) 25 mg, Oral, Daily   Vitamin D (Ergocalciferol) (DRISDOL) 50,000 Units, Oral, Every 7 days    Objective:   PHYSICAL EXAMINATION:    VITALS:   Vitals:   11/11/22 0854  BP:  132/78  Pulse: 72  SpO2: 96%  Weight: 202 lb 12.8 oz (92 kg)  Height: 5\' 10"  (1.778 m)    GEN:  The patient appears stated age and is in NAD. HEENT:  Normocephalic, atraumatic.  The mucous membranes are moist. The superficial temporal arteries are without ropiness or tenderness. CV:  RRR Lungs:  CTAB Neck/HEME:  There are no carotid bruits bilaterally.  Neurological examination:  Orientation: The patient is alert and oriented x3.  Cranial nerves: There is good facial symmetry.  Extraocular muscles are intact. The visual fields are full to confrontational testing. The speech is fluent and clear. Soft palate rises symmetrically and there is no tongue deviation. Hearing is decreased to conversational tone. Sensation: Sensation is intact to light touch throughout (facial, trunk, extremities). Vibration is decreased distally. There is no extinction with double simultaneous stimulation.  Motor: Strength is 5/5 in the bilateral upper extremities and proximal and distal left lower extremity.  Strength is 5/5 in the distal right lower extremity (knee and ankle flexion and extension).  However, strength is at least 4+-5-/5 with hip flexion on the right.  There is some giveaway weakness that improves with encouragement, but patient states that is where the issue is.  Shoulder shrug is equal and symmetric.  There is no pronator drift. Deep tendon reflexes: Deep tendon reflexes are 1/4 at the bilateral biceps, triceps, brachioradialis, patella and absent at the bilateral achilles. Plantar responses are downgoing bilaterally.  Movement examination: Tone: There is normal tone in the bilateral upper extremities.  The tone in the lower extremities is normal.  Abnormal movements: no rest tremor.  Initially no postural or intention tremor, including with finger-to-nose testing.  Later, when testing arms individually, she has R arm tremor with entrainment, variable amplitude and frequency.  she has no difficulty  with archimedes spirals.  Coordination:  There is no decremation with RAM's, with any form of RAMS, including alternating supination and pronation of the forearm, hand opening and closing, finger taps, heel taps and toe taps.  Gait and Station: The patient pushes off to arise.  She is narrow based.  There is no shuffling.  She has good armswing.  There is no tremor with ambulation. I have reviewed and interpreted the following labs independently   Chemistry      Component Value Date/Time   NA 140 08/19/2022 1452   K 4.0 08/19/2022 1452   CL 104 08/19/2022 1452   CO2 30 08/19/2022 1452   BUN 14 08/19/2022 1452   CREATININE 0.87 08/19/2022 1452      Component Value Date/Time   CALCIUM 9.5 08/19/2022 1452  ALKPHOS 74 08/19/2022 1452   AST 19 08/19/2022 1452   ALT 19 08/19/2022 1452   BILITOT 0.2 08/19/2022 1452      Lab Results  Component Value Date   TSH 1.38 02/07/2022   Lab Results  Component Value Date   WBC 5.9 04/07/2022   HGB 11.9 (L) 04/07/2022   HCT 37.0 04/07/2022   MCV 86.7 04/07/2022   PLT 179 04/07/2022   No results found for: "VITAMINB12"  Lab Results  Component Value Date   HGBA1C 5.9 04/22/2014      Total time spent on today's visit was 60 minutes, including both face-to-face time and nonface-to-face time.  Time included that spent on review of records (prior notes available to me/labs/imaging if pertinent), discussing treatment and goals, answering patient's questions and coordinating care.  Cc:  Joaquim Nam, MD

## 2022-11-08 ENCOUNTER — Encounter: Payer: Self-pay | Admitting: Family Medicine

## 2022-11-10 ENCOUNTER — Other Ambulatory Visit: Payer: Medicare Other

## 2022-11-11 ENCOUNTER — Encounter: Payer: Medicare Other | Admitting: Physical Therapy

## 2022-11-11 ENCOUNTER — Other Ambulatory Visit: Payer: Self-pay | Admitting: Neurology

## 2022-11-11 ENCOUNTER — Ambulatory Visit: Payer: Medicare Other | Admitting: Neurology

## 2022-11-11 ENCOUNTER — Encounter: Payer: Self-pay | Admitting: Neurology

## 2022-11-11 ENCOUNTER — Other Ambulatory Visit (INDEPENDENT_AMBULATORY_CARE_PROVIDER_SITE_OTHER): Payer: Medicare Other

## 2022-11-11 VITALS — BP 132/78 | HR 72 | Ht 70.0 in | Wt 202.8 lb

## 2022-11-11 DIAGNOSIS — G609 Hereditary and idiopathic neuropathy, unspecified: Secondary | ICD-10-CM

## 2022-11-11 DIAGNOSIS — Z5181 Encounter for therapeutic drug level monitoring: Secondary | ICD-10-CM

## 2022-11-11 DIAGNOSIS — R6889 Other general symptoms and signs: Secondary | ICD-10-CM

## 2022-11-11 DIAGNOSIS — M5416 Radiculopathy, lumbar region: Secondary | ICD-10-CM

## 2022-11-11 LAB — B12 AND FOLATE PANEL
Folate: 19.3 ng/mL (ref >5.9)
Vitamin B-12: 401 pg/mL (ref 211–911)

## 2022-11-11 LAB — HEMOGLOBIN A1C: Hgb A1c MFr Bld: 5.7 % (ref 4.6–6.5)

## 2022-11-11 NOTE — Patient Instructions (Signed)
A referral to [facility] has been placed for your MRI someone will contact you directly to schedule your appt. They are located at [address] Please contact them directly by calling [phone] with any questions regarding your referral.  Your provider has requested that you have labwork completed today. The lab is located on the Second floor at [address], within the [facility] office. When you get off the elevator, turn right and go in the  Endocrinology [address]; the first brown door on the left.  Tell the ladies behind the desk that you are there for lab work. If you are not called within 15 minutes please check with the front desk.   Once you complete your labs you are free to go. You will receive a call or message via MyChart with your lab results.        

## 2022-11-14 ENCOUNTER — Encounter: Payer: Medicare Other | Admitting: Physical Therapy

## 2022-11-14 ENCOUNTER — Other Ambulatory Visit: Payer: Self-pay

## 2022-11-14 ENCOUNTER — Other Ambulatory Visit: Payer: Medicare Other

## 2022-11-14 DIAGNOSIS — R6889 Other general symptoms and signs: Secondary | ICD-10-CM | POA: Diagnosis not present

## 2022-11-14 DIAGNOSIS — Z5181 Encounter for therapeutic drug level monitoring: Secondary | ICD-10-CM | POA: Diagnosis not present

## 2022-11-14 DIAGNOSIS — G609 Hereditary and idiopathic neuropathy, unspecified: Secondary | ICD-10-CM

## 2022-11-14 LAB — PROTEIN ELECTROPHORESIS, SERUM
Albumin ELP: 4.1 g/dL (ref 3.8–4.8)
Alpha 1: 0.3 g/dL (ref 0.2–0.3)
Alpha 2: 0.8 g/dL (ref 0.5–0.9)
Beta 2: 0.6 g/dL — ABNORMAL HIGH (ref 0.2–0.5)
Beta Globulin: 0.5 g/dL (ref 0.4–0.6)
Gamma Globulin: 1.2 g/dL (ref 0.8–1.7)
Total Protein: 7.5 g/dL (ref 6.1–8.1)

## 2022-11-14 LAB — IMMUNOFIXATION ELECTROPHORESIS
IgG (Immunoglobin G), Serum: 1391 mg/dL (ref 600–1540)
IgM, Serum: 73 mg/dL (ref 50–300)
Immunoglobulin A: 340 mg/dL — ABNORMAL HIGH (ref 70–320)

## 2022-11-15 ENCOUNTER — Telehealth: Payer: Self-pay | Admitting: Neurology

## 2022-11-15 LAB — IMMUNOFIXATION, SERUM
IgA/Immunoglobulin A, Serum: 325 mg/dL (ref 64–422)
IgG (Immunoglobin G), Serum: 1352 mg/dL (ref 586–1602)
IgM (Immunoglobulin M), Srm: 71 mg/dL (ref 26–217)

## 2022-11-15 LAB — SPECIMEN STATUS REPORT

## 2022-11-15 LAB — IMMUNOFIXATION, URINE

## 2022-11-15 NOTE — Telephone Encounter (Signed)
Called lab back and gave correct codes

## 2022-11-15 NOTE — Telephone Encounter (Signed)
Left message with the after hour service on 11-15-22 at 12:52 pm   Caller states that he needs to clarify lab order on patient. When calling back please use the ext [phone].  The phone number that was given by the after hour service is  [phone]  Lab ref number is [id number]

## 2022-11-16 LAB — PROTEIN ELECTRO, RANDOM URINE

## 2022-11-16 LAB — SPECIMEN STATUS REPORT

## 2022-11-18 ENCOUNTER — Ambulatory Visit: Payer: Medicare Other | Attending: Family Medicine | Admitting: Physical Therapy

## 2022-11-18 ENCOUNTER — Encounter: Payer: Self-pay | Admitting: Physical Therapy

## 2022-11-18 DIAGNOSIS — R296 Repeated falls: Secondary | ICD-10-CM | POA: Insufficient documentation

## 2022-11-18 DIAGNOSIS — R29898 Other symptoms and signs involving the musculoskeletal system: Secondary | ICD-10-CM | POA: Diagnosis not present

## 2022-11-18 LAB — SPECIMEN STATUS REPORT

## 2022-11-18 LAB — PROTEIN ELECTRO, RANDOM URINE

## 2022-11-18 NOTE — Therapy (Signed)
OUTPATIENT PHYSICAL THERAPY TREATMENT NOTE   Patient Name: Jennifer Frank MRN: [id number] DOB:1951/01/12, 72 y.o., female Today's Date: 11/18/2022  PCP: Dr. Crawford Givens  REFERRING PROVIDER: Dr. 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   Fundic gland polyps of stomach, benign    GERD (gastroesophageal reflux disease)    past hx    Hearing loss    IBS (irritable bowel syndrome)    Personal history of colonic polyps - adenoma 09/22/2013   PONV (postoperative nausea and vomiting)    Reflux    Past Surgical History:  Procedure Laterality Date   HIP ARTHROSCOPY Left 1997   KNEE ARTHROSCOPY Left 1996   KNEE ARTHROSCOPY Right    05-2016 same time as left knee replacement    POLYPECTOMY     TONSILLECTOMY AND ADENOIDECTOMY  1957   TOTAL KNEE ARTHROPLASTY Bilateral 05/27/2016   Procedure: LEFT TOTAL KNEE ARTHROPLASTY WITH RIGHT KNEE ARTHROSCOPY;  Surgeon: Gean Birchwood, MD;  Location: [facility] OR;  Service: Orthopedics;  Laterality: Bilateral;   UPPER GASTROINTESTINAL ENDOSCOPY     WISDOM TOOTH EXTRACTION     Patient Active Problem List   Diagnosis Date Noted   Tremor 09/08/2022   Osteopenia 02/11/2022   SOB (shortness of breath) 05/27/2021   Aortic atherosclerosis (HCC) 03/12/2020   Medicare annual wellness visit, subsequent 01/10/2019   Advance care planning 12/14/2017   Urinary  incontinence 12/14/2017   Primary osteoarthritis of left knee 05/26/2016   Health care maintenance 04/22/2016   Hearing loss 04/22/2014   Varicose veins of lower extremities with other complications 01/25/2013   Edema 01/25/2013   Hyperlipidemia 02/16/2007   GERD 02/16/2007    REFERRING DIAG: Repeated falls   Weakness of lower extremity, unspecified laterality  THERAPY DIAG:  Repeated falls  Weakness of lower extremity, unspecified laterality  Rationale for Evaluation and Treatment Rehabilitation  PERTINENT HISTORY: Pt reports having history of bilateral hip replacements and left knee replacement. She has recently been having bilateral swelling in her lower extremities and she attributes this to poor circulation in her legs. She has been prescribed lasix to reduce the swelling which has helped reduce edema in her legs. Prior to edema, she had fallen six times and once while reaching to grab chair and the chair sliding forward and her losing her balance. She is not sure why she is having trouble with balance, but she believes it is due to her LE weakness. Pt is unable to flex right hip without feeling increased right groin pain and she must lean back to compensate by backwards trunk lean. She has been referred to PT and has a neurology apt scheduled for  June. She has noted tremors in her right hand, arm, and leg. Her husband has COPD and he is unable to do many physical activities, so pt does physical activities such as mowing and gardening. She mostly loses her balance to her left side when walking.   PRECAUTIONS: Falls   SUBJECTIVE:                                                                                                                                                                                      SUBJECTIVE STATEMENT: Pt reports that she is relieved to know that there is no hardware damage in her right hip after having read the MRI. She has been walking more and using  techniques for stepping up and down curb which has been helpful.   PAIN:  Are you having pain? No   OBJECTIVE: (objective measures completed at initial evaluation unless otherwise dated)  VITALS: BP 115/59 hr 70    DIAGNOSTIC FINDINGS: CLINICAL DATA:  Right hip pain after bending last night, no known injury   EXAM: DG HIP (WITH OR WITHOUT PELVIS) 2-3V RIGHT   COMPARISON:  None.   FINDINGS: Three views of the right hip submitted. No acute fracture or subluxation. There is bilateral hip prosthesis with anatomic alignment. Degenerative changes pubic symphysis. Pelvic phleboliths are noted. Calcification in right upper pelvis may be vascular in nature measures 6 mm. A distal right ureteral calculus cannot be entirely excluded.   IMPRESSION: There is bilateral hip prosthesis with anatomic alignment. Degenerative changes pubic symphysis. Pelvic phleboliths are noted. Calcification in right upper pelvis may be vascular in nature measures 6 mm. A distal right ureteral calculus cannot be entirely excluded.     Electronically Signed   By: Natasha Mead M.D.   On: 06/06/2015 09:48   CLINICAL DATA:  Gait issues including nontraumatic bilateral hip flexion weakness, assessment for potential causes including lumbosacral plexopathy. Recent falls. Prior bilateral hip replacements.   EXAM: MRI PELVIS WITHOUT CONTRAST   TECHNIQUE: Multiplanar multisequence MR imaging of the pelvis was performed. No intravenous contrast was administered.   COMPARISON:  CT pelvis 06/17/2021   FINDINGS: Osseous structures and joints: Metal artifact associated with bilateral hip implants. No effusion or significant osseous edema signal. There is disc desiccation at L4-5 and L5-S1.   Musculotendinous: Small and atrophic iliopsoas muscles bilaterally in a symmetric manner not substantially changed from 06/17/2021. There is edema beneath both iliacus muscles which is nonspecific. On images 24 through  28 of series 8, we demonstrate mild left iliopsoas bursitis no other abnormal bursitis is observed.   Substantial atrophy of the upper hip adductor musculature for example on image  35 series 6, although not disproportionate to that shown on 06/17/2021.   Sacral plexus: Normal appearance of the sacral plexus and proximal sciatic nerves bilaterally. No impingement at the sciatic notch.   Other: Uterus and adnexa unremarkable. Urinary bladder normal. Small inguinal lymph nodes are not pathologically enlarged.   IMPRESSION: 1. Normal appearance of the sacral plexus and proximal sciatic nerves bilaterally. 2. Mild left iliopsoas bursitis. 3. Small and atrophic iliopsoas muscles bilaterally in a symmetric manner not substantially changed from 06/17/2021. Fluid signal deep to the iliacus musculature, nonspecific and likely chronic. 4. Substantial atrophy of the upper hip adductor musculature, although not disproportionate to that shown on 06/17/2021. 5. Disc desiccation at L4-5 and L5-S1.     Electronically Signed   By: Gaylyn Rong M.D.   On: 11/07/2022 18:54     PATIENT SURVEYS:  FOTO 49/100 with target of 55    COGNITION: Overall cognitive status: Within functional limits for tasks assessed                         SENSATION: WFL   EDEMA: Swelling in both ankles with redness.    MUSCLE LENGTH: Hamstrings: Right 70 deg; Left 70 deg Thomas test: + on RLE  Ober's test: NT     POSTURE: rounded shoulders   PALPATION: Right hip iliopsoas tendon TTP    LOWER EXTREMITY ROM:     Active  Right 10/02/2022 Left 10/02/2022  Hip flexion 120 120  Hip extension      Hip abduction      Hip adduction      Hip internal rotation      Hip external rotation      Knee flexion      Knee extension      Ankle dorsiflexion      Ankle plantarflexion      Ankle inversion      Ankle eversion       (Blank rows = not tested)        LOWER EXTREMITY MMT:   MMT Right eval  Left eval  Hip flexion 3- 4-   Hip extension 4- 4-  Hip abduction 4- 4-  Hip adduction 4- 4-  Hip internal rotation      Hip external rotation      Knee flexion 4+ 4+  Knee extension 4+ 4+  Ankle dorsiflexion 4+ 4+  Ankle plantarflexion NT  NT   Ankle inversion      Ankle eversion       (Blank rows = not tested)   LOWER EXTREMITY SPECIAL TESTS:  Hip special tests: Luisa Hart (FABER) test: positive , Ober's test: negative, Ely's test: positive , and FADIR + RLE   FUNCTIONAL TESTS:  5 times sit to stand: 0 reps (needs to use hands) 30 seconds chair stand test: 0 reps (needs to use hands) 6 minute walk test: 1,175 ft  10 meter walk test: 0.98 m/sec   Dynamic Gait Index: 17/24   GAIT: Distance walked: 50 ft  Assistive device utilized: None Level of assistance: Complete Independence Comments: Decreased step length bilaterally especially on RLE along with decreased right hip flexion      TODAY'S TREATMENT:  DATE:   11/18/22: All exercises performed on right hip  Nu-Step seat and arms at 9 and resistance at 2 for 6 min Standing hip flexor isometric into green ball 3 sec hold 3 x 10  Standing hip flexion marches with BUE support 3 x 10 -External cueing to bring top of knee to top of mat    11/04/22:  2WW -min VC for negotiating curb with down with bad and up with good  Quad Cane  -min VC for negotiating curb with down with bad and up with good  Rollator  -min VC for negotiating curb with down with bad and up with good    10/31/22: All single leg exercises performed on RLE  : 1,150 ft  MATRIX Hip Flex #35  1 x 5  Gait Analysis: Decreased step length on LLE and decreased stance time on RLE.  Rollator: 5 x 10 m  -Pt shows increased stride length and left foot drag  Quad Cane:  -Pt shows increased stride length but left foot drag  10/28/22: All  exercises performed on RLE  Nu-Step with seat and arms at 9 with resistance at 2 for 5 min  Modified Thomas Stretch 2 x 30 sec Modified Thomas Stretch with strap for quad stretch 2 x 30 sec  Side Lying Hip Abduction 3 x 10  -min VC to avoid hip rotation  Supine Marches on RLE 1 x 10  -Pt reports increased pain in right hip  Straight Leg Raise 1 x 5  - Pt reports increased pain in right hip  Supine Marches with 1 ft heel elevation using step 1 x 10 -Pt reports some discomfort but less pain then without step  Supine hip flexion isometric with 3 sec hold with 1 ft step 1 x 5  FADIR: Negative RLE   10/24/22: All exercises performed on RLE  Nu-Step with seat and arms at 9 with resistance at 2 for 5 min  Thomas Hip Flexor Stretch 3 x 30 sec  -Pt providing overpressure for increased hip flexion  -Added #5 AW for increased stretch  Seated Marches 1 x 5  -Unable to fully flex hip upwards and compensates with hamstring  Straight Leg Raise 3 x 5  -Pt reports feeling fatigue Supine Marches 1 x 3  -Pt unable to perform on RLE  Seated Right Hip Flexion with slight ER positioning for Sartorius Bias with 3 sec hold x 4  -Pt has difficulty positioning foot without sliding  Supine Right Hip Flexion with slight ER positioning for sartorius bias with 3 sec hold 1 x 5     PATIENT EDUCATION:  Education details: for correct performance of exercise  Person educated: Patient Education method: Programmer, multimedia, Demonstration, Verbal cues, and Handouts Education comprehension: verbalized understanding, returned demonstration, and verbal cues required   HOME EXERCISE PROGRAM: Access Code: [email or web address] URL: [email or web address] Date: 11/18/2022 Prepared by: Ellin Goodie  Exercises - Prone Quadriceps Stretch with Strap  - 1 x daily - 3 reps - 60 sec hold - Seated Hamstring Stretch  - 1 x daily - 3 reps - 60 sec  hold - Modified Thomas Stretch  - 1 x daily - 3 reps - 30 hold - Standing Hip  Flexion March  - 3-4 x weekly - 3 sets - 10 reps - Standing Isometric Hip Flexion with Ball at [employer]  - 1 x daily - 3 sets - 10 reps - 3 sec hold  hold   ASSESSMENT:   CLINICAL IMPRESSION:  Pt  shows improvement with right hip flexor strength with ability to perform standing hip marches through partial ROM. Pt demonstrates improved in right hip flexor strength with ability to perform marches from previously performing supine marches. PT still concerned about deep fluid signal below iliacus tendon especially considering side to side differences and extent of right hip flexor deficits. Pt will have an MRI before follow up with neurologist on July 11th. PT recommending potential ortho consult if pt does not make consistent progress over the next several weeks. She is making progress now, so she should continue with skilled PT to improve right hip strength and gait stability to decrease risk of falls.   OBJECTIVE IMPAIRMENTS: Abnormal gait, decreased balance, decreased endurance, difficulty walking, decreased ROM, decreased strength, impaired flexibility, and pain.    ACTIVITY LIMITATIONS: carrying, lifting, bending, standing, squatting, stairs, transfers, and caring for others   PARTICIPATION LIMITATIONS: cleaning, laundry, shopping, community activity, and yard work   PERSONAL FACTORS: Age, Fitness, Past/current experiences, Time since onset of injury/illness/exacerbation, and 1-2 comorbidities: bilateral hip replacements and left TKA along with bilateral swelling with intermittent increased edema are also affecting patient's functional outcome.    REHAB POTENTIAL: Good   CLINICAL DECISION MAKING: Stable/uncomplicated   EVALUATION COMPLEXITY: Low     GOALS: Goals reviewed with patient? No   SHORT TERM GOALS: Target date: 10/16/2022   Pt will be independent with HEP in order to improve strength and balance in order to decrease fall risk and improve function at home and work. Baseline: NT   Goal status: ONGOING    2.  Pt will increase by at least 0.13 m/s in order to demonstrate clinically significant improvement in community ambulation.  Baseline: NT   Goal status: ONGOING    3. Pt will increase by at least 61m (140ft) in order to demonstrate clinically significant improvement in cardiopulmonary endurance and community ambulation Baseline: 1,175 ft  Goal status: ONGOING      LONG TERM GOALS: Target date: 12/11/2022   Patient will have improved function and activity level as evidenced by an increase in FOTO score by 10 points or more.  Baseline: 49/100 with target of 55 Goal status: ONGOING    2.  Patient will perform five sit to stand repetitions in <=15 secs as evidence of LE strength that shows that this community dwelling older adult that is older is at a decreased risk of falling. (Buatois 2010)   Baseline: 0 reps  Goal status: ONGOING    3.  Patient will perform >=10 sit to stands from 18 inch chair height as evidence of LE endurance that shows she is above cut-off for age and gender matched norms and that indicate she is at a decreased risk of falling.  Baseline: 0 reps (must use hands) Goal status: ONGOING    4.  Patient will demonstrate reduced falls risk as evidenced by Dynamic Gait Index (DGI) >19/24. Baseline: 17/24 Goal status: ONGOING    5.  Patient will perform 10 mWT for normal gait speed at >=1.0 m/sec as an indication that she is above cut-off for fall risks.  Baseline: 0.98 m/sec Goal status: ONGOING    6. Patient will ambulate >=1000 ft in as evidence that she has sufficient aerobic endurance to be a community ambulator in order to complete tasks like shopping required to care for her husband.  Baseline: 1,175 ft  10/31/22: 1,150 ft  Goal status: ONGOING    PLAN:   PT FREQUENCY: 1-2x/week  PT DURATION: 10 weeks   PLANNED INTERVENTIONS: Therapeutic exercises, Therapeutic activity, Neuromuscular re-education, Balance  training, Gait training, Patient/Family education, Joint mobilization, Joint manipulation, Stair training, DME instructions, Aquatic Therapy, Dry Needling, Electrical stimulation, Spinal manipulation, Spinal mobilization, Cryotherapy, Moist heat, Manual therapy, and Re-evaluation   PLAN FOR NEXT SESSION: Continue to progress hip flexor strength and introduce hip abduction strength.   Ellin Goodie PT, DPT  Physical Therapist - [facility] 11/18/2022, 11:21 AM

## 2022-11-19 LAB — PROTEIN ELECTRO, RANDOM URINE: Protein, Ur: 5.8 mg/dL

## 2022-11-19 LAB — IMMUNOFIXATION, URINE

## 2022-11-20 ENCOUNTER — Ambulatory Visit: Payer: Medicare Other | Admitting: Physical Therapy

## 2022-11-21 LAB — PROTEIN ELECTRO, RANDOM URINE
Albumin ELP, Urine: 100 %
Alpha-1-Globulin, U: 0 %
Alpha-2-Globulin, U: 0 %
Beta Globulin, U: 0 %
Gamma Globulin, U: 0 %
Protein, Ur: 6.7 mg/dL

## 2022-11-21 LAB — IMMUNOFIXATION, URINE

## 2022-11-22 ENCOUNTER — Ambulatory Visit: Payer: Medicare Other | Admitting: Neurology

## 2022-11-22 DIAGNOSIS — G609 Hereditary and idiopathic neuropathy, unspecified: Secondary | ICD-10-CM

## 2022-11-22 DIAGNOSIS — M5416 Radiculopathy, lumbar region: Secondary | ICD-10-CM

## 2022-11-22 NOTE — Procedures (Signed)
[facility]  [address] [place], [address]  [place], [place] [address] Tel: [phone] Fax: [phone] Test Date:  11/22/2022  Patient: Jennifer Frank DOB: 11/17/1950 Physician: Nita Sickle, DO  Sex: Female Height: 5\' 10"  Ref Phys: Kerin Salen, DO  ID#: [id number]   Technician:    History: This is a 72 year old female referred for evaluation of gait instability.  NCV & EMG Findings: Extensive electrodiagnostic testing of the right lower extremity and additional studies of the left shows:  Bilateral sural and superficial peroneal sensory responses are absent. Bilateral peroneal and tibial motor responses are within normal limits. Bilateral tibial H reflex studies are within normal limits. Chronic motor axon loss changes are seen affecting bilateral flexor digitorum longus, rectus femoris, and adductor longus muscles, without accompanying active denervation.  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   ___________________________ Nita Sickle, DO    Nerve Conduction Studies   Stim Site NR Peak (ms) Norm Peak (ms) O-P Amp (V) Norm O-P Amp  Left Sup Peroneal Anti Sensory (Ant Lat Mall)  32 C  12 cm *NR  <4.6  >3  Right Sup Peroneal Anti Sensory (Ant Lat Mall)  32 C  12 cm *NR  <4.6  >3  Left Sural Anti Sensory (Lat Mall)  32 C  Calf *NR  <4.6  >3  Right Sural Anti Sensory (Lat Mall)  32 C  Calf *NR    >3     Stim Site NR Onset (ms) Norm Onset (ms) O-P Amp (mV) Norm O-P Amp Site1 Site2 Delta-0 (ms) Dist (cm) Vel (m/s) Norm Vel (m/s)  Left Peroneal Motor (Ext Dig Brev)  32 C  Ankle    3.5 <6.0 3.5 >2.5 B Fib Ankle 8.2 40.0 49 >40  B Fib    11.7  2.5  Poplt B Fib 1.6 8.0 50 >40  Poplt    13.3  2.2         Right Peroneal Motor (Ext Dig Brev)  32 C  Ankle    3.0 <6.0 3.1 >2.5 B Fib Ankle 8.1  40.0 49 >40  B Fib    11.1  2.6  Poplt B Fib 1.6 9.0 56 >40  Poplt    12.7  2.5         Left Tibial Motor (Abd Hall Brev)  32 C  Ankle    3.8 <6.0 5.3 >4 Knee Ankle 9.6 41.0 43 >40  Knee    13.4  4.3         Right Tibial Motor (Abd Hall Brev)  32 C  Ankle    3.4 <6.0 5.9 >4 Knee Ankle 10.0 46.0 46 >40  Knee    13.4  3.5          Electromyography   Side Muscle Ins.Act Fibs Fasc Recrt Amp Dur Poly Activation Comment  Right AntTibialis Nml Nml Nml Nml Nml Nml Nml Nml N/A  Right Gastroc Nml Nml Nml Nml Nml Nml Nml Nml N/A  Right Flex Dig Long Nml Nml Nml *2- *1+ *1+ *1+ Nml N/A  Right RectFemoris Nml Nml Nml *2- *1+ *1+ *1+ Nml N/A  Right AdductLong Nml Nml Nml *2- *1+ *1+ *1+ Nml N/A  Right BicepsFemS Nml Nml Nml Nml Nml Nml Nml Nml N/A  Right GluteusMed Nml Nml Nml Nml Nml Nml Nml Nml N/A  Left AntTibialis Nml Nml Nml Nml Nml Nml Nml Nml  N/A  Left Gastroc Nml Nml Nml Nml Nml Nml Nml Nml N/A  Left Flex Dig Long Nml Nml Nml *1- *1+ *1+ *1+ Nml N/A  Left RectFemoris Nml Nml Nml *2- *1+ *1+ *1+ Nml N/A  Left AdductLong Nml Nml Nml *2- *1+ *1+ *1+ Nml N/A  Left GluteusMed Nml Nml Nml Nml Nml Nml Nml Nml N/A      Waveforms:

## 2022-11-25 ENCOUNTER — Ambulatory Visit: Payer: Medicare Other | Admitting: Physical Therapy

## 2022-11-28 ENCOUNTER — Ambulatory Visit: Payer: Medicare Other | Admitting: Physical Therapy

## 2022-11-28 ENCOUNTER — Telehealth: Payer: Self-pay | Admitting: Physical Therapy

## 2022-11-28 ENCOUNTER — Encounter: Payer: Medicare Other | Admitting: Neurology

## 2022-11-28 DIAGNOSIS — R29898 Other symptoms and signs involving the musculoskeletal system: Secondary | ICD-10-CM

## 2022-11-28 DIAGNOSIS — R296 Repeated falls: Secondary | ICD-10-CM

## 2022-11-28 NOTE — Telephone Encounter (Signed)
Called pt to inquire about absence from PT. Pt did not respond so PT left VM instructing pt to call back to reschedule apt.

## 2022-11-28 NOTE — Therapy (Addendum)
OUTPATIENT PHYSICAL THERAPY SCREEN AND DISCHARGE   Discharge Summary: Pt has since been evaluated further by orthopedist who determined that she has spinal nerve impingement with motor deficits caused by disc bulge. She has elected to seek further medical eval and treatment.   Patient Name: Jennifer Frank MRN: [id number] DOB:[birth year], 72 y.o., female Today's Date: 11/28/2022  PCP: Dr. Crawford Givens  REFERRING PROVIDER: Dr. 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   Fundic gland polyps of stomach, benign    GERD (gastroesophageal reflux disease)    past hx    Hearing loss    IBS (irritable bowel syndrome)    Personal history of colonic polyps - adenoma 09/22/2013   PONV (postoperative nausea and vomiting)    Reflux    Past Surgical History:  Procedure Laterality Date   HIP ARTHROSCOPY Left 1997   KNEE ARTHROSCOPY Left 1996   KNEE ARTHROSCOPY Right    05-2016 same time as left knee replacement    POLYPECTOMY     TONSILLECTOMY AND ADENOIDECTOMY  1957   TOTAL KNEE ARTHROPLASTY Bilateral 05/27/2016   Procedure: LEFT TOTAL KNEE ARTHROPLASTY WITH RIGHT KNEE ARTHROSCOPY;  Surgeon: Gean Birchwood, MD;  Location: [facility] OR;  Service: Orthopedics;  Laterality: Bilateral;   UPPER GASTROINTESTINAL ENDOSCOPY     WISDOM TOOTH EXTRACTION     Patient Active Problem List   Diagnosis Date Noted    Tremor 09/08/2022   Osteopenia 02/11/2022   SOB (shortness of breath) 05/27/2021   Aortic atherosclerosis (HCC) 03/12/2020   Medicare annual wellness visit, subsequent 01/10/2019   Advance care planning 12/14/2017   Urinary incontinence 12/14/2017   Primary osteoarthritis of left knee 05/26/2016   Health care maintenance 04/22/2016   Hearing loss 04/22/2014   Varicose veins of lower extremities with other complications 01/25/2013   Edema 01/25/2013   Hyperlipidemia 02/16/2007   GERD 02/16/2007    REFERRING DIAG: Repeated falls   Weakness of lower extremity, unspecified laterality  THERAPY DIAG:  Repeated falls  Weakness of lower extremity, unspecified laterality  Rationale for Evaluation and Treatment Rehabilitation  PERTINENT HISTORY: Pt reports having history of bilateral hip replacements and left knee replacement. She has recently been having bilateral swelling in her lower extremities and she attributes this to poor circulation in her legs. She has been prescribed lasix to reduce the swelling which has helped reduce edema in her legs. Prior to edema, she had fallen six times and once while reaching to grab chair and the chair sliding forward and her losing her balance. She is not sure why she is having trouble with balance, but she believes it is due to her  LE weakness. Pt is unable to flex right hip without feeling increased right groin pain and she must lean back to compensate by backwards trunk lean. She has been referred to PT and has a neurology apt scheduled for June. She has noted tremors in her right hand, arm, and leg. Her husband has COPD and he is unable to do many physical activities, so pt does physical activities such as mowing and gardening. She mostly loses her balance to her left side when walking.   PRECAUTIONS: Falls   SUBJECTIVE:                                                                                                                                                                                       SUBJECTIVE STATEMENT: Pt reports being aware of the EMG and she is concerned about ongoing RLE weakness and low back pain that has not improved since beginning of PT. She is going to have MRI next Friday to determine what is causing nerve impingement in low back. She is being followed closely by neurology at this point.   PAIN:  Are you having pain? No   OBJECTIVE: (objective measures completed at initial evaluation unless otherwise dated)  VITALS: BP 115/59 hr 70    DIAGNOSTIC FINDINGS: CLINICAL DATA:  Right hip pain after bending last night, no known injury   EXAM: DG HIP (WITH OR WITHOUT PELVIS) 2-3V RIGHT   COMPARISON:  None.   FINDINGS: Three views of the right hip submitted. No acute fracture or subluxation. There is bilateral hip prosthesis with anatomic alignment. Degenerative changes pubic symphysis. Pelvic phleboliths are noted. Calcification in right upper pelvis may be vascular in nature measures 6 mm. A distal right ureteral calculus cannot be entirely excluded.   IMPRESSION: There is bilateral hip prosthesis with anatomic alignment. Degenerative changes pubic symphysis. Pelvic phleboliths are noted. Calcification in right upper pelvis may be vascular in nature measures 6 mm. A distal right ureteral calculus cannot be entirely excluded.     Electronically Signed   By: Natasha Mead M.D.   On: 06/06/2015 09:48   CLINICAL DATA:  Gait issues including nontraumatic bilateral hip flexion weakness, assessment for potential causes including lumbosacral plexopathy. Recent falls. Prior bilateral hip replacements.   EXAM: MRI PELVIS WITHOUT CONTRAST   TECHNIQUE: Multiplanar multisequence MR imaging of the pelvis was performed. No intravenous contrast was administered.   COMPARISON:  CT pelvis 06/17/2021   FINDINGS: Osseous structures and joints: Metal artifact associated with bilateral hip implants. No effusion or  significant osseous edema signal. There is disc desiccation at L4-5 and L5-S1.   Musculotendinous: Small and atrophic iliopsoas muscles bilaterally  in a symmetric manner not substantially changed from 06/17/2021. There is edema beneath both iliacus muscles which is nonspecific. On images 24 through 28 of series 8, we demonstrate mild left iliopsoas bursitis no other abnormal bursitis is observed.   Substantial atrophy of the upper hip adductor musculature for example on image 35 series 6, although not disproportionate to that shown on 06/17/2021.   Sacral plexus: Normal appearance of the sacral plexus and proximal sciatic nerves bilaterally. No impingement at the sciatic notch.   Other: Uterus and adnexa unremarkable. Urinary bladder normal. Small inguinal lymph nodes are not pathologically enlarged.   IMPRESSION: 1. Normal appearance of the sacral plexus and proximal sciatic nerves bilaterally. 2. Mild left iliopsoas bursitis. 3. Small and atrophic iliopsoas muscles bilaterally in a symmetric manner not substantially changed from 06/17/2021. Fluid signal deep to the iliacus musculature, nonspecific and likely chronic. 4. Substantial atrophy of the upper hip adductor musculature, although not disproportionate to that shown on 06/17/2021. 5. Disc desiccation at L4-5 and L5-S1.     Electronically Signed   By: Gaylyn Rong M.D.   On: 11/07/2022 18:54     PATIENT SURVEYS:  FOTO 49/100 with target of 55    COGNITION: Overall cognitive status: Within functional limits for tasks assessed                         SENSATION: WFL   EDEMA: Swelling in both ankles with redness.    MUSCLE LENGTH: Hamstrings: Right 70 deg; Left 70 deg Thomas test: + on RLE  Ober's test: NT     POSTURE: rounded shoulders   PALPATION: Right hip iliopsoas tendon TTP    LOWER EXTREMITY ROM:     Active  Right 10/02/2022 Left 10/02/2022  Hip flexion 120 120  Hip extension      Hip  abduction      Hip adduction      Hip internal rotation      Hip external rotation      Knee flexion      Knee extension      Ankle dorsiflexion      Ankle plantarflexion      Ankle inversion      Ankle eversion       (Blank rows = not tested)        LOWER EXTREMITY MMT:   MMT Right eval Left eval  Hip flexion 3- 4-   Hip extension 4- 4-  Hip abduction 4- 4-  Hip adduction 4- 4-  Hip internal rotation      Hip external rotation      Knee flexion 4+ 4+  Knee extension 4+ 4+  Ankle dorsiflexion 4+ 4+  Ankle plantarflexion NT  NT   Ankle inversion      Ankle eversion       (Blank rows = not tested)   LOWER EXTREMITY SPECIAL TESTS:  Hip special tests: Luisa Hart (FABER) test: positive , Ober's test: negative, Ely's test: positive , and FADIR + RLE   FUNCTIONAL TESTS:  5 times sit to stand: 0 reps (needs to use hands) 30 seconds chair stand test: 0 reps (needs to use hands) 6 minute walk test: 1,175 ft  10 meter walk test: 0.98 m/sec   Dynamic Gait Index: 17/24   GAIT: Distance walked: 50 ft  Assistive device utilized: None Level of assistance: Complete Independence Comments: Decreased step length bilaterally especially on RLE along with decreased right hip flexion  TODAY'S TREATMENT:                                                                                                                              DATE:   11/28/22: Discussed symptoms of lumbar radiculopathy and need for further medical eval and treatment before moving forward with additional PT.   Continued to emphasize use of rollator to prevent falls while pt is experiencing RLE weakness and low back pain.   11/18/22: All exercises performed on right hip  Nu-Step seat and arms at 9 and resistance at 2 for 6 min Standing hip flexor isometric into green ball 3 sec hold 3 x 10  Standing hip flexion marches with BUE support 3 x 10 -External cueing to bring top of knee to top of mat    11/04/22:   2WW -min VC for negotiating curb with down with bad and up with good  Quad Cane  -min VC for negotiating curb with down with bad and up with good  Rollator  -min VC for negotiating curb with down with bad and up with good    10/31/22: All single leg exercises performed on RLE  : 1,150 ft  MATRIX Hip Flex #35  1 x 5  Gait Analysis: Decreased step length on LLE and decreased stance time on RLE.  Rollator: 5 x 10 m  -Pt shows increased stride length and left foot drag  Quad Cane:  -Pt shows increased stride length but left foot drag  10/28/22: All exercises performed on RLE  Nu-Step with seat and arms at 9 with resistance at 2 for 5 min  Modified Thomas Stretch 2 x 30 sec Modified Thomas Stretch with strap for quad stretch 2 x 30 sec  Side Lying Hip Abduction 3 x 10  -min VC to avoid hip rotation  Supine Marches on RLE 1 x 10  -Pt reports increased pain in right hip  Straight Leg Raise 1 x 5  - Pt reports increased pain in right hip  Supine Marches with 1 ft heel elevation using step 1 x 10 -Pt reports some discomfort but less pain then without step  Supine hip flexion isometric with 3 sec hold with 1 ft step 1 x 5  FADIR: Negative RLE   10/24/22: All exercises performed on RLE  Nu-Step with seat and arms at 9 with resistance at 2 for 5 min  Thomas Hip Flexor Stretch 3 x 30 sec  -Pt providing overpressure for increased hip flexion  -Added #5 AW for increased stretch  Seated Marches 1 x 5  -Unable to fully flex hip upwards and compensates with hamstring  Straight Leg Raise 3 x 5  -Pt reports feeling fatigue Supine Marches 1 x 3  -Pt unable to perform on RLE  Seated Right Hip Flexion with slight ER positioning for Sartorius Bias with 3 sec hold x 4  -Pt has difficulty positioning foot without sliding  Supine Right Hip Flexion  with slight ER positioning for sartorius bias with 3 sec hold 1 x 5     PATIENT EDUCATION:  Education details: for correct performance of exercise   Person educated: Patient Education method: Explanation, Demonstration, Verbal cues, and Handouts Education comprehension: verbalized understanding, returned demonstration, and verbal cues required   HOME EXERCISE PROGRAM: Access Code: [email or web address] URL: [email or web address] Date: 11/18/2022 Prepared by: Ellin Goodie  Exercises - Prone Quadriceps Stretch with Strap  - 1 x daily - 3 reps - 60 sec hold - Seated Hamstring Stretch  - 1 x daily - 3 reps - 60 sec  hold - Modified Thomas Stretch  - 1 x daily - 3 reps - 30 hold - Standing Hip Flexion March  - 3-4 x weekly - 3 sets - 10 reps - Standing Isometric Hip Flexion with Ball at [employer]  - 1 x daily - 3 sets - 10 reps - 3 sec hold  hold   ASSESSMENT:   CLINICAL IMPRESSION:  Pt shows improvement with right hip flexor strength with ability to perform standing hip marches through partial ROM. Pt demonstrates improved in right hip flexor strength with ability to perform marches from previously performing supine marches. PT still concerned about deep fluid signal below iliacus tendon especially considering side to side differences and extent of right hip flexor deficits. Pt will have an MRI before follow up with neurologist on July 11th. PT recommending potential ortho consult if pt does not make consistent progress over the next several weeks. She is making progress now, so she should continue with skilled PT to improve right hip strength and gait stability to decrease risk of falls.   OBJECTIVE IMPAIRMENTS: Abnormal gait, decreased balance, decreased endurance, difficulty walking, decreased ROM, decreased strength, impaired flexibility, and pain.    ACTIVITY LIMITATIONS: carrying, lifting, bending, standing, squatting, stairs, transfers, and caring for others   PARTICIPATION LIMITATIONS: cleaning, laundry, shopping, community activity, and yard work   PERSONAL FACTORS: Age, Fitness, Past/current experiences, Time since onset of  injury/illness/exacerbation, and 1-2 comorbidities: bilateral hip replacements and left TKA along with bilateral swelling with intermittent increased edema are also affecting patient's functional outcome.    REHAB POTENTIAL: Good   CLINICAL DECISION MAKING: Stable/uncomplicated   EVALUATION COMPLEXITY: Low     GOALS: Goals reviewed with patient? No   SHORT TERM GOALS: Target date: 10/16/2022   Pt will be independent with HEP in order to improve strength and balance in order to decrease fall risk and improve function at home and work. Baseline: NT  Goal status: ONGOING    2.  Pt will increase by at least 0.13 m/s in order to demonstrate clinically significant improvement in community ambulation.  Baseline: NT   Goal status: ONGOING    3. Pt will increase by at least 57m (158ft) in order to demonstrate clinically significant improvement in cardiopulmonary endurance and community ambulation Baseline: 1,175 ft  Goal status: ONGOING      LONG TERM GOALS: Target date: 12/11/2022   Patient will have improved function and activity level as evidenced by an increase in FOTO score by 10 points or more.  Baseline: 49/100 with target of 55 Goal status: NOT MET    2.  Patient will perform five sit to stand repetitions in <=15 secs as evidence of LE strength that shows that this community dwelling older adult that is older is at a decreased risk of falling. (Buatois 2010)   Baseline: 0 reps  Goal status: NOT MET  3.  Patient will perform >=10 sit to stands from 18 inch chair height as evidence of LE endurance that shows she is above cut-off for age and gender matched norms and that indicate she is at a decreased risk of falling.  Baseline: 0 reps (must use hands) Goal status: NOT MET    4.  Patient will demonstrate reduced falls risk as evidenced by Dynamic Gait Index (DGI) >19/24. Baseline: 17/24 Goal status: NOT MET    5.  Patient will perform 10 mWT for normal gait speed  at >=1.0 m/sec as an indication that she is above cut-off for fall risks.  Baseline: 0.98 m/sec Goal status: NOT MET    6. Patient will ambulate >=1000 ft in as evidence that she has sufficient aerobic endurance to be a community ambulator in order to complete tasks like shopping required to care for her husband.  Baseline: 1,175 ft  10/31/22: 1,150 ft  Goal status: NOT MET    PLAN:   PT FREQUENCY: 1-2x/week   PT DURATION: 10 weeks   PLANNED INTERVENTIONS: Therapeutic exercises, Therapeutic activity, Neuromuscular re-education, Balance training, Gait training, Patient/Family education, Joint mobilization, Joint manipulation, Stair training, DME instructions, Aquatic Therapy, Dry Needling, Electrical stimulation, Spinal manipulation, Spinal mobilization, Cryotherapy, Moist heat, Manual therapy, and Re-evaluation   PLAN FOR NEXT SESSION: Discharge from PT   Ellin Goodie PT, DPT  Physical Therapist - [facility] 11/28/2022, 11:15 AM

## 2022-12-02 ENCOUNTER — Encounter: Payer: Medicare Other | Admitting: Physical Therapy

## 2022-12-05 ENCOUNTER — Encounter: Payer: Medicare Other | Admitting: Physical Therapy

## 2022-12-06 ENCOUNTER — Ambulatory Visit
Admission: RE | Admit: 2022-12-06 | Discharge: 2022-12-06 | Disposition: A | Discharge status: Home / Self Care | Payer: Medicare Other | Source: Ambulatory Visit | Attending: Neurology | Admitting: Neurology

## 2022-12-06 DIAGNOSIS — M5186 Other intervertebral disc disorders, lumbar region: Secondary | ICD-10-CM | POA: Diagnosis not present

## 2022-12-06 DIAGNOSIS — M5416 Radiculopathy, lumbar region: Secondary | ICD-10-CM

## 2022-12-06 DIAGNOSIS — M4726 Other spondylosis with radiculopathy, lumbar region: Secondary | ICD-10-CM | POA: Diagnosis not present

## 2022-12-09 ENCOUNTER — Ambulatory Visit: Payer: Medicare Other | Admitting: Physical Therapy

## 2022-12-12 ENCOUNTER — Encounter: Payer: Self-pay | Admitting: Neurology

## 2022-12-12 ENCOUNTER — Encounter: Payer: Medicare Other | Admitting: Physical Therapy

## 2022-12-16 ENCOUNTER — Ambulatory Visit: Payer: Medicare Other | Admitting: Physical Therapy

## 2022-12-16 NOTE — Telephone Encounter (Signed)
Let pt know that MRI lumbar spine looked pretty good with mild degenerative changes.  I know she is in PT and that is the tx.  If that is not beneficial, we can send her to Dr. Lorrine Kin for pain management, but there is nothing more from a neuro standpoint as all her tests/exam looked pretty good.

## 2022-12-18 NOTE — Telephone Encounter (Signed)
Pt called requesting a lab appt. Were the orders ever placed? I didn't see any orders. Call back # [phone]?

## 2022-12-18 NOTE — Telephone Encounter (Signed)
Scheduled pt for 8/1  

## 2022-12-19 ENCOUNTER — Other Ambulatory Visit (INDEPENDENT_AMBULATORY_CARE_PROVIDER_SITE_OTHER): Payer: Medicare Other

## 2022-12-19 ENCOUNTER — Ambulatory Visit: Payer: Medicare Other | Admitting: Physical Therapy

## 2022-12-19 DIAGNOSIS — E559 Vitamin D deficiency, unspecified: Secondary | ICD-10-CM | POA: Diagnosis not present

## 2022-12-19 LAB — VITAMIN D 25 HYDROXY (VIT D DEFICIENCY, FRACTURES): VITD: 40.66 ng/mL (ref 30.00–100.00)

## 2022-12-22 ENCOUNTER — Other Ambulatory Visit: Payer: Self-pay | Admitting: Family Medicine

## 2022-12-22 MED ORDER — VITAMIN D3 50 MCG (2000 UT) PO CAPS
2000.0000 [IU] | ORAL_CAPSULE | Freq: Every day | ORAL | Status: DC
Start: 2022-12-22 — End: 2024-03-20

## 2022-12-31 ENCOUNTER — Telehealth: Payer: Self-pay | Admitting: Family Medicine

## 2022-12-31 NOTE — Telephone Encounter (Signed)
Pt called in stated she needs a new prescription called in for B12 with the changes PCP made .[phone]

## 2023-01-01 NOTE — Telephone Encounter (Signed)
Does she mean vit D?  She can get that OTC.  Please check with patient and update me as needed.  Thanks.

## 2023-01-02 NOTE — Telephone Encounter (Signed)
Called patient Vit D was requested. Let patient know that she should get over the counter. She will go pick up and take as directed. Will call if any questions.

## 2023-01-02 NOTE — Telephone Encounter (Signed)
Left message to return call to our office.  

## 2023-02-05 ENCOUNTER — Ambulatory Visit (INDEPENDENT_AMBULATORY_CARE_PROVIDER_SITE_OTHER): Payer: Medicare Other

## 2023-02-05 VITALS — Ht 71.0 in | Wt 197.0 lb

## 2023-02-05 DIAGNOSIS — Z1231 Encounter for screening mammogram for malignant neoplasm of breast: Secondary | ICD-10-CM

## 2023-02-05 DIAGNOSIS — Z0000 Encounter for general adult medical examination without abnormal findings: Secondary | ICD-10-CM | POA: Diagnosis not present

## 2023-02-05 NOTE — Patient Instructions (Signed)
Ms. Deol , Thank you for taking time to come for your Medicare Wellness Visit. I appreciate your ongoing commitment to your health goals. Please review the following plan we discussed and let me know if I can assist you in the future.   Referrals/Orders/Follow-Ups/Clinician Recommendations: none  This is a list of the screening recommended for you and due dates:  Health Maintenance  Topic Date Due   Zoster (Shingles) Vaccine (1 of 2) Never done   Mammogram  09/13/2022   Flu Shot  12/19/2022   Medicare Annual Wellness Visit  02/05/2024   DTaP/Tdap/Td vaccine (3 - Td or Tdap) 03/17/2024   Colon Cancer Screening  04/08/2024   Pneumonia Vaccine  Completed   DEXA scan (bone density measurement)  Completed   Hepatitis C Screening  Completed   HPV Vaccine  Aged Out   COVID-19 Vaccine  Discontinued    Advanced directives: (Copy Requested) Please bring a copy of your health care power of attorney and living will to the office to be added to your chart at your convenience.  Next Medicare Annual Wellness Visit scheduled for next year: Yes 02/09/23 @ 10:10am telephone

## 2023-02-05 NOTE — Progress Notes (Signed)
Subjective:   Jennifer Frank is a 72 y.o. female who presents for Medicare Annual (Subsequent) preventive examination.  Visit Complete: Virtual  I connected with  Jennifer Frank on 02/05/23 by a audio enabled telemedicine application and verified that I am speaking with the correct person using two identifiers.  Patient Location: Home  Provider Location: Home Office  I discussed the limitations of evaluation and management by telemedicine. The patient expressed understanding and agreed to proceed.  Vital Signs: Unable to obtain new vitals due to this being a telehealth visit.  Patient Medicare AWV questionnaire was completed by the patient on  (Not done); I have confirmed that all information answered by patient is correct and no changes since this date. Cardiac Risk Factors include: advanced age (>49men, >1 women);dyslipidemia;sedentary lifestyle    Objective:    Today's Vitals   02/05/23 1121  Weight: 197 lb (89.4 kg)  Height: 5\' 11"  (1.803 m)   Body mass index is 27.48 kg/m.     02/05/2023   11:46 AM 11/11/2022    8:56 AM 04/07/2022   11:14 AM 02/23/2020    2:52 PM 12/09/2017    2:01 PM 05/16/2016    9:30 AM 01/28/2016    1:01 AM  Advanced Directives  Does Patient Have a Medical Advance Directive? Yes Yes No Yes Yes Yes Yes  Type of Estate agent of [place];Living will Living will  Healthcare Power of [place];Living will Healthcare Power of [place];Living will Healthcare Power of [place];Living will Healthcare Power of [place];Living will  Does patient want to make changes to medical advance directive?      No - Patient declined No - Patient declined  Copy of Healthcare Power of Attorney in Chart?    No - copy requested No - copy requested No - copy requested No - copy requested  Would patient like information on creating a medical advance directive?   No - Patient declined        Current Medications (verified) Outpatient Encounter  Medications as of 02/05/2023  Medication Sig   betamethasone valerate (VALISONE) 0.1 % cream APPLY A THIN LAYER TO THE AFFECTED AREA(S) BY TOPICAL ROUTE ONCE DAILY AS NEEDED   famotidine (PEPCID) 20 MG tablet Take 1 tablet (20 mg total) by mouth daily.   furosemide (LASIX) 20 MG tablet TAKE 1 TABLET (20 MG TOTAL) BY MOUTH DAILY AS NEEDED FOR FLUID.   loratadine (CLARITIN) 10 MG tablet Take 1 tablet (10 mg total) by mouth daily.   mirabegron ER (MYRBETRIQ) 25 MG TB24 tablet Take 1 tablet (25 mg total) by mouth daily.   Cholecalciferol (VITAMIN D3) 50 MCG (2000 UT) capsule Take 1 capsule (2,000 Units total) by mouth daily. (Patient not taking: Reported on 02/05/2023)   No facility-administered encounter medications on file as of 02/05/2023.    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   Fundic gland polyps of stomach, benign    GERD (gastroesophageal reflux disease)    past hx    Hearing loss    IBS (irritable bowel syndrome)    Personal history of colonic polyps - adenoma 09/22/2013   PONV (postoperative nausea and vomiting)    Reflux    Past Surgical History:  Procedure Laterality Date   HIP ARTHROSCOPY Left 1997   KNEE  ARTHROSCOPY Left 1996   KNEE ARTHROSCOPY Right    05-2016 same time as left knee replacement    POLYPECTOMY     TONSILLECTOMY AND ADENOIDECTOMY  1957   TOTAL KNEE ARTHROPLASTY Bilateral 05/27/2016   Procedure: LEFT TOTAL KNEE ARTHROPLASTY WITH RIGHT KNEE ARTHROSCOPY;  Surgeon: Gean Birchwood, MD;  Location: [facility] OR;  Service: Orthopedics;  Laterality: Bilateral;   UPPER GASTROINTESTINAL ENDOSCOPY     WISDOM TOOTH EXTRACTION     Family History  Problem Relation Age of Onset   Deep vein thrombosis Mother    Heart disease Mother        before age 60    Hyperlipidemia Mother    Hypertension Mother    Other Mother        varicose veins   Heart attack Mother    Peripheral vascular disease Mother    Bleeding Disorder Mother    Colon polyps Mother    Deep vein thrombosis Father    Heart disease Father        before age 51   Hyperlipidemia Father    Hypertension Father    Heart attack Father    Bleeding Disorder Father    AAA (abdominal aortic aneurysm) Father    Colon polyps Father    Cancer Brother    Hyperlipidemia Brother    Colon cancer Brother 82       died 02/24/2008   Colon polyps Brother    Colon polyps Son    Crohn's disease Son    Breast cancer Paternal Aunt    Parkinson's disease Paternal Uncle    Esophageal cancer Neg Hx    Stomach cancer Neg Hx    Rectal cancer Neg Hx    Social History   Socioeconomic History   Marital status: Married    Spouse name: Not on file   Number of children: 2   Years of education: Not on file   Highest education level: Not on file  Occupational History   Occupation: Retired    Associate Professor: RETIRED    Comment: Pensions consultant  Tobacco Use   Smoking status: Never   Smokeless tobacco: Never  Vaping Use   Vaping status: Never Used  Substance and Sexual Activity   Alcohol use: Not Currently    Alcohol/week: 1.0 standard drink of alcohol    Types: 1 Glasses of wine per week    Comment: rarely   Drug use: No   Sexual activity: Not on file  Other Topics Concern   Not on file  Social History Narrative   Married since 02-23-1974   2 kids   Right handed    Social Determinants of Health   Financial Resource Strain: Low Risk  (02/05/2023)   Overall Financial Resource Strain (CARDIA)    Difficulty of Paying Living Expenses: Not hard at all  Food Insecurity: No Food Insecurity (02/05/2023)   Hunger Vital Sign    Worried About Running Out of Food in the Last Year: Never true    Ran Out of Food in the Last Year: Never true  Transportation Needs: No Transportation Needs (02/05/2023)   PRAPARE  - Administrator, Civil Service (Medical): No    Lack of Transportation (Non-Medical): No  Physical Activity: Inactive (02/05/2023)   Exercise Vital Sign    Days of Exercise per Week: 0 days    Minutes of Exercise per Session: 0 min  Stress: No Stress Concern Present (02/05/2023)   [employer] of Occupational Health - Occupational  Stress Questionnaire    Feeling of Stress : Not at all  Social Connections: Socially Integrated (02/05/2023)   Social Connection and Isolation Panel [NHANES]    Frequency of Communication with Friends and Family: More than three times a week    Frequency of Social Gatherings with Friends and Family: More than three times a week    Attends Religious Services: More than 4 times per year    Active Member of [employer] or Organizations: Yes    Attends Engineer, structural: More than 4 times per year    Marital Status: Married    Tobacco Counseling Counseling given: Not Answered   Clinical Intake:  Pre-visit preparation completed: Yes  Pain : No/denies pain    BMI - recorded: 27.48 Nutritional Status: BMI 25 -29 Overweight Nutritional Risks: None Diabetes: No  How often do you need to have someone help you when you read instructions, pamphlets, or other written materials from your doctor or pharmacy?: 1 - Never  Interpreter Needed?: No  Comments: lives with husband Information entered by :: B.Marchele Decock,LPN   Activities of Daily Living    02/05/2023   11:47 AM  In your present state of health, do you have any difficulty performing the following activities:  Hearing? 1  Vision? 0  Difficulty concentrating or making decisions? 0  Walking or climbing stairs? 1  Dressing or bathing? 0  Doing errands, shopping? 0  Preparing Food and eating ? N  Using the Toilet? N  In the past six months, have you accidently leaked urine? Y  Do you have problems with loss of bowel control? N  Managing your Medications? N  Managing your  Finances? N  Housekeeping or managing your Housekeeping? N    Patient Care Team: Joaquim Nam, MD as PCP - General (Family Medicine) Harold Hedge, MD as Consulting Physician (Obstetrics and Gynecology)  Indicate any recent Medical Services you may have received from other than [facility] providers in the past year (date may be approximate).     Assessment:   This is a routine wellness examination for Keondra.  Hearing/Vision screen Hearing Screening - Comments:: Pt says she has a deficit in hearing: cannot afford hearing aides Vision Screening - Comments:: Pt says vision is good with glasses [facility]   Goals Addressed               This Visit's Progress     COMPLETED: DIET - INCREASE WATER INTAKE   On track     Starting 12/09/2017, I will attempt to drink at least 6-8 glasses of water daily.       COMPLETED: Patient Stated   On track     02/23/2020, I will maintain and continue medications as prescribed.       Weight (lb) < 200 lb (90.7 kg) (pt-stated)   197 lb (89.4 kg)     I would like to lose 15 pounds       Depression Screen    02/05/2023   11:41 AM 09/05/2022   10:39 AM 05/25/2021    9:41 AM 03/06/2020   11:00 AM 02/23/2020    2:53 PM 01/04/2019    8:23 AM 12/09/2017    2:00 PM  PHQ 2/9 Scores  PHQ - 2 Score 0 0 0 0 0 0 0  PHQ- 9 Score  1   0  0    Fall Risk    02/05/2023   11:27 AM 11/11/2022    8:55 AM 09/05/2022  10:32 AM 08/19/2022    2:19 PM 05/25/2021    9:41 AM  Fall Risk   Falls in the past year? 1 1 1 1  0  Number falls in past yr: 1 1 1 1  0  Injury with Fall? 0 0 0 0 0  Risk for fall due to : History of fall(s);Impaired balance/gait  History of fall(s) History of fall(s) No Fall Risks  Follow up Education provided;Falls prevention discussed Falls evaluation completed Falls evaluation completed Falls evaluation completed Falls evaluation completed    MEDICARE RISK AT HOME: Medicare Risk at Home Any stairs in or around the home?: Yes If  so, are there any without handrails?: Yes Home free of loose throw rugs in walkways, pet beds, electrical cords, etc?: Yes Adequate lighting in your home to reduce risk of falls?: Yes Life alert?: No Use of a cane, walker or w/c?: No Grab bars in the bathroom?: Yes Shower chair or bench in shower?: No Elevated toilet seat or a handicapped toilet?: Yes  TIMED UP AND GO:  Was the test performed?  No    Cognitive Function:    02/23/2020    3:00 PM 12/09/2017    2:00 PM  MMSE - Mini Mental State Exam  Orientation to time 5 5  Orientation to Place 5 5  Registration 3 3  Attention/ Calculation 5 0  Recall 3 0  Recall-comments  unable to recall 3 of 3 words  Language- name 2 objects  0  Language- repeat 1 1  Language- follow 3 step command  3  Language- read & follow direction  0  Write a sentence  0  Copy design  0  Total score  17        02/05/2023   11:49 AM  6CIT Screen  What Year? 0 points  What month? 0 points  What time? 0 points  Count back from 20 0 points  Months in reverse 0 points  Repeat phrase 0 points  Total Score 0 points    Immunizations Immunization History  Administered Date(s) Administered   Fluad Quad(high Dose 65+) 03/06/2020, 02/05/2021   Influenza Whole 02/21/2010, 02/21/2014   PFIZER(Purple Top)SARS-COV-2 Vaccination 06/08/2019, 06/27/2019, 03/23/2020   PPD Test 05/30/2016   Pneumococcal Conjugate-13 06/08/2019   Pneumococcal Polysaccharide-23 03/06/2020   Td 01/16/2009   Tdap 03/17/2014    TDAP status: Up to date  Flu Vaccine status: Up to date  Pneumococcal vaccine status: Up to date  Covid-19 vaccine status: Completed vaccines  Qualifies for Shingles Vaccine? Yes   Zostavax completed No   Shingrix Completed?: No.    Education has been provided regarding the importance of this vaccine. Patient has been advised to call insurance company to determine out of pocket expense if they have not yet received this vaccine. Advised may also  receive vaccine at local pharmacy or Health Dept. Verbalized acceptance and understanding.  Screening Tests Health Maintenance  Topic Date Due   Zoster Vaccines- Shingrix (1 of 2) Never done   MAMMOGRAM  09/13/2022   INFLUENZA VACCINE  12/19/2022   Medicare Annual Wellness (AWV)  02/05/2024   DTaP/Tdap/Td (3 - Td or Tdap) 03/17/2024   Colonoscopy  04/08/2024   Pneumonia Vaccine 67+ Years old  Completed   DEXA SCAN  Completed   Hepatitis C Screening  Completed   HPV VACCINES  Aged Out   COVID-19 Vaccine  Discontinued    Health Maintenance  Health Maintenance Due  Topic Date Due   Zoster Vaccines- Shingrix (  1 of 2) Never done   MAMMOGRAM  09/13/2022   INFLUENZA VACCINE  12/19/2022    Colorectal cancer screening: Type of screening: Colonoscopy. Completed yes. Repeat every 5 years  Mammogram status: Ordered yes. Pt provided with contact info and advised to call to schedule appt.   Bone Density status: Completed yes. Results reflect: Bone density results: OSTEOPENIA. Repeat every 3 years.  Lung Cancer Screening: (Low Dose CT Chest recommended if Age 70-80 years, 20 pack-year currently smoking OR have quit w/in 15years.) does not qualify.   Lung Cancer Screening Referral: no  Additional Screening:  Hepatitis C Screening: does not qualify; Completed yes  Vision Screening: Recommended annual ophthalmology exams for early detection of glaucoma and other disorders of the eye. Is the patient up to date with their annual eye exam?  Yes  Who is the provider or what is the name of the office in which the patient attends annual eye exams? [facility] If pt is not established with a provider, would they like to be referred to a provider to establish care? No .   Dental Screening: Recommended annual dental exams for proper oral hygiene  Diabetic Foot Exam: n/a  Community Resource Referral / Chronic Care Management: CRR required this visit?  No   CCM required this visit?   No    Plan:     I have personally reviewed and noted the following in the patient's chart:   Medical and social history Use of alcohol, tobacco or illicit drugs  Current medications and supplements including opioid prescriptions. Patient is not currently taking opioid prescriptions. Functional ability and status Nutritional status Physical activity Advanced directives List of other physicians Hospitalizations, surgeries, and ER visits in previous 12 months Vitals Screenings to include cognitive, depression, and falls Referrals and appointments  In addition, I have reviewed and discussed with patient certain preventive protocols, quality metrics, and best practice recommendations. A written personalized care plan for preventive services as well as general preventive health recommendations were provided to patient.    Sue Lush, LPN   02/01/7828   After Visit Summary: (MyChart) Due to this being a telephonic visit, the after visit summary with patients personalized plan was offered to patient via MyChart   Nurse Notes: Pt says she is doing well at this time. She has no concerns and/or questions at this time.  *MMG order placed

## 2023-02-23 ENCOUNTER — Other Ambulatory Visit: Payer: Self-pay | Admitting: Family Medicine

## 2023-02-23 DIAGNOSIS — E785 Hyperlipidemia, unspecified: Secondary | ICD-10-CM

## 2023-03-04 ENCOUNTER — Other Ambulatory Visit (INDEPENDENT_AMBULATORY_CARE_PROVIDER_SITE_OTHER): Payer: Medicare Other

## 2023-03-04 DIAGNOSIS — E785 Hyperlipidemia, unspecified: Secondary | ICD-10-CM

## 2023-03-04 LAB — COMPREHENSIVE METABOLIC PANEL
ALT: 13 U/L (ref 0–35)
AST: 15 U/L (ref 0–37)
Albumin: 4 g/dL (ref 3.5–5.2)
Alkaline Phosphatase: 77 U/L (ref 39–117)
BUN: 12 mg/dL (ref 6–23)
CO2: 27 meq/L (ref 19–32)
Calcium: 9.3 mg/dL (ref 8.4–10.5)
Chloride: 104 meq/L (ref 96–112)
Creatinine, Ser: 0.92 mg/dL (ref 0.40–1.20)
GFR: 62.33 mL/min (ref >60.00)
Glucose, Bld: 89 mg/dL (ref 70–99)
Potassium: 4.3 meq/L (ref 3.5–5.1)
Sodium: 140 meq/L (ref 135–145)
Total Bilirubin: 0.3 mg/dL (ref 0.2–1.2)
Total Protein: 7.1 g/dL (ref 6.0–8.3)

## 2023-03-04 LAB — LIPID PANEL
Cholesterol: 236 mg/dL — ABNORMAL HIGH (ref 0–200)
HDL: 66 mg/dL (ref >39.00)
LDL Cholesterol: 146 mg/dL — ABNORMAL HIGH (ref 0–99)
NonHDL: 170.31
Total CHOL/HDL Ratio: 4
Triglycerides: 121 mg/dL (ref 0.0–149.0)
VLDL: 24.2 mg/dL (ref 0.0–40.0)

## 2023-03-04 LAB — TSH: TSH: 2.93 u[IU]/mL (ref 0.35–5.50)

## 2023-03-11 ENCOUNTER — Ambulatory Visit: Payer: Medicare Other | Admitting: Family Medicine

## 2023-03-11 ENCOUNTER — Encounter: Payer: Self-pay | Admitting: Family Medicine

## 2023-03-11 VITALS — BP 132/72 | HR 81 | Temp 98.4°F | Ht 69.0 in | Wt 206.0 lb

## 2023-03-11 DIAGNOSIS — M549 Dorsalgia, unspecified: Secondary | ICD-10-CM

## 2023-03-11 DIAGNOSIS — Z7189 Other specified counseling: Secondary | ICD-10-CM | POA: Diagnosis not present

## 2023-03-11 DIAGNOSIS — Z0000 Encounter for general adult medical examination without abnormal findings: Secondary | ICD-10-CM

## 2023-03-11 DIAGNOSIS — Z23 Encounter for immunization: Secondary | ICD-10-CM

## 2023-03-11 DIAGNOSIS — R42 Dizziness and giddiness: Secondary | ICD-10-CM | POA: Diagnosis not present

## 2023-03-11 DIAGNOSIS — R238 Other skin changes: Secondary | ICD-10-CM

## 2023-03-11 DIAGNOSIS — R32 Unspecified urinary incontinence: Secondary | ICD-10-CM | POA: Diagnosis not present

## 2023-03-11 LAB — CBC WITH DIFFERENTIAL/PLATELET
Basophils Absolute: 0 10*3/uL (ref 0.0–0.1)
Basophils Relative: 0.2 % (ref 0.0–3.0)
Eosinophils Absolute: 0.1 10*3/uL (ref 0.0–0.7)
Eosinophils Relative: 1 % (ref 0.0–5.0)
HCT: 40.6 % (ref 36.0–46.0)
Hemoglobin: 13.2 g/dL (ref 12.0–15.0)
Lymphocytes Relative: 19 % (ref 12.0–46.0)
Lymphs Abs: 1.9 10*3/uL (ref 0.7–4.0)
MCHC: 32.5 g/dL (ref 30.0–36.0)
MCV: 86.8 fL (ref 78.0–100.0)
Monocytes Absolute: 0.6 10*3/uL (ref 0.1–1.0)
Monocytes Relative: 5.5 % (ref 3.0–12.0)
Neutro Abs: 7.4 10*3/uL (ref 1.4–7.7)
Neutrophils Relative %: 74.3 % (ref 43.0–77.0)
Platelets: 294 10*3/uL (ref 150.0–400.0)
RBC: 4.68 Mil/uL (ref 3.87–5.11)
RDW: 14.1 % (ref 11.5–15.5)
WBC: 10 10*3/uL (ref 4.0–10.5)

## 2023-03-11 MED ORDER — BETAMETHASONE VALERATE 0.1 % EX CREA: TOPICAL_CREAM | Freq: Every day | CUTANEOUS | 3 refills | Status: DC | PRN

## 2023-03-11 MED ORDER — FAMOTIDINE 20 MG PO TABS: 20.0000 mg | ORAL_TABLET | Freq: Every day | ORAL | Status: DC | PRN

## 2023-03-11 NOTE — Patient Instructions (Addendum)
Go to the lab on the way out.   If you have mychart we'll likely use that to update you.    Take care.  Glad to see you. Don't change your meds for now.  Flu shot today.

## 2023-03-11 NOTE — Progress Notes (Unsigned)
Tetanus 2015 Flu 2024 PNA 2021 Shingles d/w pt.   covid vaccine 2021 Colonoscopy 2020 Pap per gyn- she'll f/u with Dr. Huntley Dec.  I'll defer.   Mammogram pending per gyn.   DXA prev done per gyn.  Husband designated if patient were incapacitated.   Taking myrbetriq at baseline.  She didn't tolerate higher dose.  Taking 25mg  daily.  He can tolerate as is.  Leaking supine at night.  Using a pad at night. Not leaking as much during the day.  Rare use of lasix, not in the last month.  Some BLE with medial shin irritation B, wears compression stockings daily.   Has used topical steroid for skin irritation, refill done.    She used a back brace in the meantime, during the day.  Took tylenol.  Had prev EMG and MRI L spine done.  Back pain is better in the meantime.  Discussed not intervening at this point.    No FCNAVD.  She has episodic lightheadedness on standing. Betting sitting down.  No syncope.    Meds, vitals, and allergies reviewed.   ROS: Per HPI unless specifically indicated in ROS section   GEN: nad, alert and oriented HEENT: mucous membranes moist NECK: supple w/o LA CV: rrr. PULM: ctab, no inc wob ABD: soft, +bs EXT: trace BLE edema SKIN: no acute rash medial shin irritation B w/o ulceration.

## 2023-03-12 DIAGNOSIS — M549 Dorsalgia, unspecified: Secondary | ICD-10-CM | POA: Insufficient documentation

## 2023-03-12 DIAGNOSIS — R42 Dizziness and giddiness: Secondary | ICD-10-CM | POA: Insufficient documentation

## 2023-03-12 DIAGNOSIS — R238 Other skin changes: Secondary | ICD-10-CM | POA: Insufficient documentation

## 2023-03-12 NOTE — Assessment & Plan Note (Signed)
She used a back brace in the meantime, during the day.  Took tylenol.  Had prev EMG and MRI L spine done, d/w pt.  Back pain is better in the meantime.  Discussed not intervening at this point.  Update [place] as needed.

## 2023-03-12 NOTE — Assessment & Plan Note (Signed)
Continue myrbetriq at baseline.  She didn't tolerate higher dose.  Taking 25mg  daily.  He can tolerate as is.  Leaking supine at night.  Using a pad at night. Not leaking as much during the day.  Rare use of lasix, not in the last month.  Some BLE with medial shin irritation B, wears compression stockings daily.

## 2023-03-12 NOTE — Assessment & Plan Note (Signed)
Recheck CBC today.  See notes on labs. Cautions d/w pt.

## 2023-03-12 NOTE — Assessment & Plan Note (Signed)
Husband designated if patient were incapacitated. 

## 2023-03-12 NOTE — Assessment & Plan Note (Signed)
Tetanus 2015 Flu 2024 PNA 2021 Shingles d/w pt.   covid vaccine 2021 Colonoscopy 2020 Pap per gyn- she'll f/u with Dr. Huntley Dec.  I'll defer.   Mammogram pending per gyn.   DXA prev done per gyn.  Husband designated if patient were incapacitated.

## 2023-03-12 NOTE — Assessment & Plan Note (Addendum)
Continue topical betamethasone prn.

## 2023-03-27 ENCOUNTER — Encounter (HOSPITAL_COMMUNITY): Payer: Self-pay | Admitting: *Deleted

## 2023-03-27 ENCOUNTER — Ambulatory Visit (HOSPITAL_COMMUNITY)
Admission: EM | Admit: 2023-03-27 | Discharge: 2023-03-27 | Disposition: A | Discharge status: Home / Self Care | Payer: Medicare Other | Attending: Emergency Medicine | Admitting: Emergency Medicine

## 2023-03-27 DIAGNOSIS — R112 Nausea with vomiting, unspecified: Secondary | ICD-10-CM | POA: Insufficient documentation

## 2023-03-27 DIAGNOSIS — K5732 Diverticulitis of large intestine without perforation or abscess without bleeding: Secondary | ICD-10-CM | POA: Diagnosis not present

## 2023-03-27 DIAGNOSIS — R197 Diarrhea, unspecified: Secondary | ICD-10-CM | POA: Insufficient documentation

## 2023-03-27 LAB — COMPREHENSIVE METABOLIC PANEL
ALT: 13 U/L (ref 0–44)
AST: 15 U/L (ref 15–41)
Albumin: 3.6 g/dL (ref 3.5–5.0)
Alkaline Phosphatase: 79 U/L (ref 38–126)
Anion gap: 9 (ref 5–15)
BUN: 13 mg/dL (ref 8–23)
CO2: 24 mmol/L (ref 22–32)
Calcium: 8.9 mg/dL (ref 8.9–10.3)
Chloride: 105 mmol/L (ref 98–111)
Creatinine, Ser: 0.95 mg/dL (ref 0.44–1.00)
GFR, Estimated: >60 mL/min (ref >60)
Glucose, Bld: 136 mg/dL — ABNORMAL HIGH (ref 70–99)
Potassium: 4.2 mmol/L (ref 3.5–5.1)
Sodium: 138 mmol/L (ref 135–145)
Total Bilirubin: 0.4 mg/dL (ref <1.2)
Total Protein: 7.6 g/dL (ref 6.5–8.1)

## 2023-03-27 MED ORDER — PANTOPRAZOLE SODIUM 20 MG PO TBEC: 20.0000 mg | DELAYED_RELEASE_TABLET | Freq: Every day | ORAL | 0 refills | Status: DC

## 2023-03-27 MED ORDER — ONDANSETRON HCL 4 MG PO TABS: 4.0000 mg | ORAL_TABLET | Freq: Four times a day (QID) | ORAL | 0 refills | Status: DC

## 2023-03-27 MED ORDER — LOPERAMIDE HCL 2 MG PO CAPS: 2.0000 mg | ORAL_CAPSULE | Freq: Four times a day (QID) | ORAL | 0 refills | Status: DC | PRN

## 2023-03-27 MED ORDER — SIMETHICONE 125 MG PO CAPS: 125.0000 mg | ORAL_CAPSULE | Freq: Two times a day (BID) | ORAL | 0 refills | Status: DC | PRN

## 2023-03-27 MED ORDER — AMOXICILLIN-POT CLAVULANATE 875-125 MG PO TABS: 1.0000 | ORAL_TABLET | Freq: Two times a day (BID) | ORAL | 0 refills | Status: DC

## 2023-03-27 NOTE — ED Provider Notes (Signed)
[facility]    CSN: [id number] Arrival date & time: 03/27/23  1835      History   Chief Complaint Chief Complaint  Patient presents with   Abdominal Pain   Diarrhea   Emesis    HPI Jennifer Frank is a 72 y.o. female.   Patient is reporting bloating, diarrhea, cramping x 2 weeks intermittently.  This week she has started vomiting with increased abdominal pressure, heartburn,  The history is provided by the patient.  Abdominal Pain Pain location:  Generalized Pain quality: aching, bloating and cramping   Relieved by:  Nothing Ineffective treatments:  Antacids Associated symptoms: diarrhea, nausea and vomiting   Diarrhea Quality:  Copious and watery Severity:  Moderate Duration:  1 week Progression:  Worsening Relieved by:  Nothing Worsened by:  Liquids Associated symptoms: abdominal pain and vomiting   Emesis Severity:  Moderate Duration:  2 days Timing:  Intermittent Quality:  Bilious material and stomach contents Relieved by:  Nothing Associated symptoms: abdominal pain and diarrhea     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   Fundic gland polyps of stomach, benign    GERD (gastroesophageal reflux disease)    past hx    Hearing loss    IBS (irritable bowel syndrome)    Personal history of colonic polyps - adenoma 09/22/2013   PONV (postoperative nausea and vomiting)    Reflux     Patient Active Problem List   Diagnosis Date Noted   Back pain 03/12/2023   Lightheaded 03/12/2023   Skin irritation 03/12/2023   Tremor 09/08/2022   Osteopenia 02/11/2022   SOB (shortness of breath) 05/27/2021   Aortic atherosclerosis (HCC) 03/12/2020   Medicare annual wellness visit, subsequent 01/10/2019   Advance care planning 12/14/2017   Urinary incontinence 12/14/2017   Primary osteoarthritis of left knee 05/26/2016   Health care maintenance 04/22/2016   Hearing  loss 04/22/2014   Varicose veins of bilateral lower extremities with other complications 01/25/2013   Edema 01/25/2013   Hyperlipidemia 02/16/2007   GERD 02/16/2007    Past Surgical History:  Procedure Laterality Date   HIP ARTHROSCOPY Left 1997   KNEE ARTHROSCOPY Left 1996   KNEE ARTHROSCOPY Right    05-2016 same time as left knee replacement    POLYPECTOMY     TONSILLECTOMY AND ADENOIDECTOMY  1957   TOTAL KNEE ARTHROPLASTY Bilateral 05/27/2016   Procedure: LEFT TOTAL KNEE ARTHROPLASTY WITH RIGHT KNEE ARTHROSCOPY;  Surgeon: Gean Birchwood, MD;  Location: [facility];  Service: Orthopedics;  Laterality: Bilateral;   UPPER GASTROINTESTINAL ENDOSCOPY     WISDOM TOOTH EXTRACTION      OB History   No obstetric history on file.      Home Medications    Prior to Admission medications   Medication Sig Start Date End Date Taking? Authorizing Provider  amoxicillin-clavulanate (AUGMENTIN) 875-125 MG tablet Take 1 tablet by mouth every 12 (twelve) hours. 03/27/23  Yes Jak Haggar, Linde Gillis, NP  Cholecalciferol (VITAMIN D3) 50 MCG (2000 UT) capsule Take 1 capsule (2,000 Units total) by mouth daily. 12/22/22  Yes Joaquim Nam, MD  loperamide (IMODIUM) 2 MG capsule Take 1 capsule (2 mg total) by mouth 4 (four) times daily as needed for diarrhea or loose stools (For greater than 4 stools daily). 03/27/23  Yes Jabarie Pop, Linde Gillis, NP  ondansetron (ZOFRAN) 4 MG tablet Take 1 tablet (4 mg  total) by mouth every 6 (six) hours. 03/27/23  Yes Alyss Granato, Linde Gillis, NP  pantoprazole (PROTONIX) 20 MG tablet Take 1 tablet (20 mg total) by mouth daily. 03/27/23  Yes Hoy Fallert, Linde Gillis, NP  Simethicone 125 MG CAPS Take 1 capsule (125 mg total) by mouth 2 (two) times daily as needed. 03/27/23  Yes Yamilka Lopiccolo, Linde Gillis, NP  betamethasone valerate (VALISONE) 0.1 % cream Apply topically daily as needed. 03/11/23   Joaquim Nam, MD  furosemide (LASIX) 20 MG tablet TAKE 1 TABLET (20 MG TOTAL) BY MOUTH DAILY AS NEEDED FOR FLUID. 09/27/22    Joaquim Nam, MD  loratadine (CLARITIN) 10 MG tablet Take 1 tablet (10 mg total) by mouth daily. 02/07/22   Joaquim Nam, MD  mirabegron ER (MYRBETRIQ) 25 MG TB24 tablet Take 1 tablet (25 mg total) by mouth daily. 09/05/22   Joaquim Nam, MD    Family History Family History  Problem Relation Age of Onset   Deep vein thrombosis Mother    Heart disease Mother        before age 9   Hyperlipidemia Mother    Hypertension Mother    Other Mother        varicose veins   Heart attack Mother    Peripheral vascular disease Mother    Bleeding Disorder Mother    Colon polyps Mother    Deep vein thrombosis Father    Heart disease Father        before age 60   Hyperlipidemia Father    Hypertension Father    Heart attack Father    Bleeding Disorder Father    AAA (abdominal aortic aneurysm) Father    Colon polyps Father    Cancer Brother    Hyperlipidemia Brother    Colon cancer Brother 8       died 18-Feb-2008   Colon polyps Brother    Colon polyps Son    Crohn's disease Son    Breast cancer Paternal Aunt    Parkinson's disease Paternal Uncle    Esophageal cancer Neg Hx    Stomach cancer Neg Hx    Rectal cancer Neg Hx     Social History Social History   Tobacco Use   Smoking status: Never   Smokeless tobacco: Never  Vaping Use   Vaping status: Never Used  Substance Use Topics   Alcohol use: Not Currently    Alcohol/week: 1.0 standard drink of alcohol    Types: 1 Glasses of wine per week    Comment: rarely   Drug use: No     Allergies   Codeine, Darvocet [propoxyphene n-acetaminophen], Morphine and codeine, Oxybutynin, and Vioxx [rofecoxib]   Review of Systems Review of Systems  Constitutional:  Positive for appetite change.  Gastrointestinal:  Positive for abdominal distention, abdominal pain, diarrhea, nausea and vomiting.     Physical Exam Triage Vital Signs ED Triage Vitals  Encounter Vitals Group     BP 03/27/23 1927 117/80     Systolic BP  Percentile --      Diastolic BP Percentile --      Pulse Rate 03/27/23 1927 81     Resp 03/27/23 1927 18     Temp 03/27/23 1927 98.7 F (37.1 C)     Temp Source 03/27/23 1927 Oral     SpO2 03/27/23 1927 97 %     Weight --      Height --      Head Circumference --  Peak Flow --      Pain Score 03/27/23 1925 4     Pain Loc --      Pain Education --      Exclude from Growth Chart --    No data found.  Updated Vital Signs BP 117/80 (BP Location: Left Arm)   Pulse 81   Temp 98.7 F (37.1 C) (Oral)   Resp 18   SpO2 97%   Visual Acuity Right Eye Distance:   Left Eye Distance:   Bilateral Distance:    Right Eye Near:   Left Eye Near:    Bilateral Near:     Physical Exam Vitals and nursing note reviewed.  Cardiovascular:     Rate and Rhythm: Normal rate.  Pulmonary:     Effort: Pulmonary effort is normal.     Breath sounds: Normal breath sounds.  Abdominal:     General: Bowel sounds are increased. There is distension.     Palpations: Abdomen is soft.     Tenderness: There is generalized abdominal tenderness.  Skin:    General: Skin is warm.  Neurological:     General: No focal deficit present.     Mental Status: She is alert.      UC Treatments / Results  Labs (all labs ordered are listed, but only abnormal results are displayed) Labs Reviewed  COMPREHENSIVE METABOLIC PANEL    EKG   Radiology No results found.  Procedures Procedures (including critical care time)  Medications Ordered in UC Medications - No data to display  Initial Impression / Assessment and Plan / UC Course  I have reviewed the triage vital signs and the nursing notes.  Pertinent labs & imaging results that were available during my care of the patient were reviewed by me and considered in my medical decision making (see chart for details).   Patient's symptoms are consistent with possible diverticulitis.  She denies any emesis or stools with blood.  She denies any recent  travel or contact with other ill people.  We will treat with Augmentin.  We will have order Zofran for nausea, simethicone for her bloating with Protonix as her current treatment of Pepcid is ineffective.  Have also ordered loperamide for watery diarrhea greater than 4 episodes per day.  We have discussed other possibilities as she has a history of IBS but has not had a flareup for years. CMP was ordered, we will call her for any abnormal findings.  There is concern for electrolyte imbalance as she is had diarrhea greater than a week and has started vomiting. Patient is recommended to follow-up with PCP if symptoms do not resolve with treatment.  She will need further evaluation such as abdominal CT at that time.  Final Clinical Impressions(s) / UC Diagnoses   Final diagnoses:  Diverticulitis of colon without hemorrhage  Nausea vomiting and diarrhea  Diarrhea, unspecified type     Discharge Instructions      Take antibiotic until completed.  Take with Probiotics such as yogurt, kefir, or probiotic tablets of your choice Loperamide for diarrhea greater than 4 stools a day. Simethicone 1 capsule twice daily as needed for bloating. Ondansetron 1 tablet every 6 hours for nausea and vomiting. We will call you if blood work is abnormal Bland low fiber diet until diarrhea is resolved. Follow-up with primary care if no resolution with completion of antibiotic.  You will need further abdominal scans at that time.   ED Prescriptions     Medication Sig  Dispense Auth. Provider   pantoprazole (PROTONIX) 20 MG tablet Take 1 tablet (20 mg total) by mouth daily. 30 tablet Haelyn Forgey, Linde Gillis, NP   ondansetron (ZOFRAN) 4 MG tablet Take 1 tablet (4 mg total) by mouth every 6 (six) hours. 12 tablet Deloyd Handy, Linde Gillis, NP   Simethicone 125 MG CAPS Take 1 capsule (125 mg total) by mouth 2 (two) times daily as needed. 28 capsule Remington Highbaugh, Linde Gillis, NP   loperamide (IMODIUM) 2 MG capsule Take 1 capsule (2 mg total)  by mouth 4 (four) times daily as needed for diarrhea or loose stools (For greater than 4 stools daily). 12 capsule Janifer Gieselman, Linde Gillis, NP   amoxicillin-clavulanate (AUGMENTIN) 875-125 MG tablet Take 1 tablet by mouth every 12 (twelve) hours. 14 tablet Thelia Tanksley, Linde Gillis, NP      PDMP not reviewed this encounter.   Nelda Marseille, NP 03/27/23 2032

## 2023-03-27 NOTE — Discharge Instructions (Addendum)
Take antibiotic until completed.  Take with Probiotics such as yogurt, kefir, or probiotic tablets of your choice Loperamide for diarrhea greater than 4 stools a day. Simethicone 1 capsule twice daily as needed for bloating. Ondansetron 1 tablet every 6 hours for nausea and vomiting. We will call you if blood work is abnormal Bland low fiber diet until diarrhea is resolved. Follow-up with primary care if no resolution with completion of antibiotic.  You will need further abdominal scans at that time.

## 2023-03-27 NOTE — ED Triage Notes (Signed)
Pt states last week she started having mid abdominal pain and gas. A day or so after she started with diarrhea and vomiting. She said she has been eating bland foods thinking it was going to get better but it hasn't.

## 2023-06-18 ENCOUNTER — Other Ambulatory Visit: Payer: Self-pay | Admitting: Family Medicine

## 2023-06-18 ENCOUNTER — Encounter: Payer: Self-pay | Admitting: Family Medicine

## 2023-06-18 DIAGNOSIS — E559 Vitamin D deficiency, unspecified: Secondary | ICD-10-CM

## 2023-07-08 ENCOUNTER — Encounter: Payer: Self-pay | Admitting: Family Medicine

## 2023-07-08 ENCOUNTER — Ambulatory Visit (INDEPENDENT_AMBULATORY_CARE_PROVIDER_SITE_OTHER): Payer: Medicare Other | Admitting: Family Medicine

## 2023-07-08 VITALS — BP 124/72 | HR 74 | Temp 98.9°F | Ht 69.0 in | Wt 216.2 lb

## 2023-07-08 DIAGNOSIS — Z659 Problem related to unspecified psychosocial circumstances: Secondary | ICD-10-CM

## 2023-07-08 DIAGNOSIS — G4700 Insomnia, unspecified: Secondary | ICD-10-CM | POA: Diagnosis not present

## 2023-07-08 DIAGNOSIS — M549 Dorsalgia, unspecified: Secondary | ICD-10-CM | POA: Diagnosis not present

## 2023-07-08 MED ORDER — TRAZODONE HCL 50 MG PO TABS: 25.0000 mg | ORAL_TABLET | Freq: Every evening | ORAL | 3 refills | Status: DC | PRN

## 2023-07-08 MED ORDER — CELECOXIB 100 MG PO CAPS: 100.0000 mg | ORAL_CAPSULE | Freq: Two times a day (BID) | ORAL | 1 refills | Status: DC | PRN
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
## 2023-07-09 DIAGNOSIS — G4700 Insomnia, unspecified: Secondary | ICD-10-CM | POA: Insufficient documentation

## 2023-07-09 DIAGNOSIS — Z659 Problem related to unspecified psychosocial circumstances: Secondary | ICD-10-CM | POA: Insufficient documentation

## 2023-07-09 NOTE — Assessment & Plan Note (Signed)
Can retry celebrex and update me as needed.

## 2023-07-09 NOTE — Assessment & Plan Note (Signed)
Start trazodone prn, routine cautions d/w pt.
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
## 2023-07-15 ENCOUNTER — Encounter: Payer: Self-pay | Admitting: Family Medicine

## 2023-07-30 ENCOUNTER — Other Ambulatory Visit: Payer: Self-pay | Admitting: Family Medicine

## 2023-07-31 NOTE — Telephone Encounter (Signed)
 LOV 07/08/23  NOV nothing scheduled  Last refill 07/08/23 #30 w/ 3 refills

## 2023-09-11 ENCOUNTER — Ambulatory Visit: Admitting: Family Medicine

## 2023-09-11 ENCOUNTER — Encounter: Payer: Self-pay | Admitting: Family Medicine

## 2023-09-11 ENCOUNTER — Ambulatory Visit: Attending: Family Medicine

## 2023-09-11 ENCOUNTER — Ambulatory Visit (INDEPENDENT_AMBULATORY_CARE_PROVIDER_SITE_OTHER)
Admission: RE | Admit: 2023-09-11 | Discharge: 2023-09-11 | Disposition: A | Discharge status: Home / Self Care | Source: Ambulatory Visit | Attending: Family Medicine | Admitting: Family Medicine

## 2023-09-11 VITALS — BP 128/74 | HR 72 | Temp 98.4°F | Ht 69.0 in | Wt 214.0 lb

## 2023-09-11 DIAGNOSIS — M545 Low back pain, unspecified: Secondary | ICD-10-CM

## 2023-09-11 DIAGNOSIS — R55 Syncope and collapse: Secondary | ICD-10-CM

## 2023-09-11 DIAGNOSIS — M549 Dorsalgia, unspecified: Secondary | ICD-10-CM | POA: Diagnosis not present

## 2023-09-11 DIAGNOSIS — Z96643 Presence of artificial hip joint, bilateral: Secondary | ICD-10-CM | POA: Diagnosis not present

## 2023-09-11 DIAGNOSIS — M47816 Spondylosis without myelopathy or radiculopathy, lumbar region: Secondary | ICD-10-CM | POA: Diagnosis not present

## 2023-09-11 LAB — CBC WITH DIFFERENTIAL/PLATELET
Basophils Absolute: 0 10*3/uL (ref 0.0–0.1)
Basophils Relative: 0.5 % (ref 0.0–3.0)
Eosinophils Absolute: 0.3 10*3/uL (ref 0.0–0.7)
Eosinophils Relative: 3.3 % (ref 0.0–5.0)
HCT: 37.9 % (ref 36.0–46.0)
Hemoglobin: 12.6 g/dL (ref 12.0–15.0)
Lymphocytes Relative: 21.1 % (ref 12.0–46.0)
Lymphs Abs: 1.8 10*3/uL (ref 0.7–4.0)
MCHC: 33.3 g/dL (ref 30.0–36.0)
MCV: 84.4 fl (ref 78.0–100.0)
Monocytes Absolute: 0.4 10*3/uL (ref 0.1–1.0)
Monocytes Relative: 5.2 % (ref 3.0–12.0)
Neutro Abs: 6 10*3/uL (ref 1.4–7.7)
Neutrophils Relative %: 69.9 % (ref 43.0–77.0)
Platelets: 283 10*3/uL (ref 150.0–400.0)
RBC: 4.49 Mil/uL (ref 3.87–5.11)
RDW: 14.5 % (ref 11.5–15.5)
WBC: 8.6 10*3/uL (ref 4.0–10.5)

## 2023-09-11 LAB — COMPREHENSIVE METABOLIC PANEL WITH GFR
ALT: 12 U/L (ref 0–35)
AST: 15 U/L (ref 0–37)
Albumin: 4.2 g/dL (ref 3.5–5.2)
Alkaline Phosphatase: 76 U/L (ref 39–117)
BUN: 15 mg/dL (ref 6–23)
CO2: 28 meq/L (ref 19–32)
Calcium: 9.3 mg/dL (ref 8.4–10.5)
Chloride: 102 meq/L (ref 96–112)
Creatinine, Ser: 0.85 mg/dL (ref 0.40–1.20)
GFR: 68.29 mL/min (ref >60.00)
Glucose, Bld: 100 mg/dL — ABNORMAL HIGH (ref 70–99)
Potassium: 4.1 meq/L (ref 3.5–5.1)
Sodium: 137 meq/L (ref 135–145)
Total Bilirubin: 0.3 mg/dL (ref 0.2–1.2)
Total Protein: 7.3 g/dL (ref 6.0–8.3)

## 2023-09-11 LAB — TSH: TSH: 1.63 u[IU]/mL (ref 0.35–5.50)

## 2023-09-11 MED ORDER — PREDNISONE 20 MG PO TABS: ORAL_TABLET | ORAL | 0 refills | Status: DC

## 2023-09-11 NOTE — Progress Notes (Signed)
 She fell into a door about 2 weeks ago.  She didn't pass out but had presyncope, she felt lightheaded prior to event.    She had another episode with full syncope. That was about 1.5 months ago.  That was single event.  She was getting out of a chair when she had the event.    Using rollator in the meantime, with a seat.    R lower back pain in the meantime.  Pain can radiate down the R>L leg.  That has been going on chronically.    She had a clear discussion with her husband and her home situation is better, d/w pt.    She usually wears support hose for BLE edema.  Taking lasix  about every other day until swelling improves.  Then she has a period of time off med.    Taking celebrex /advil for pain.    Meds, vitals, and allergies reviewed.   ROS: Per HPI unless specifically indicated in ROS section   GEN: nad, alert and oriented HEENT:ncat NECK: supple w/o LA CV: rrr.  PULM: ctab, no inc wob ABD: soft, +bs EXT: no edema SKIN: Well-perfused. Strength and sensation grossly intact in lower extremities.  EKG reviewed and discussed with patient at office visit.

## 2023-09-11 NOTE — Patient Instructions (Addendum)
 We'll update you about the labs.  You should get a call about the monitor and echo.  Take care.  Glad to see you. Stop ibuprofen and celebrex  and change to prednisone , with food.

## 2023-09-14 DIAGNOSIS — R55 Syncope and collapse: Secondary | ICD-10-CM | POA: Insufficient documentation

## 2023-09-14 NOTE — Assessment & Plan Note (Signed)
 See notes on plain films.  Stop ibuprofen and Celebrex  and change to prednisone .

## 2023-09-14 NOTE — Assessment & Plan Note (Signed)
 No symptoms now.  Not lightheaded on standing.  See notes on labs.  EKG without acute changes.  Check echo and ZIO monitor.  Routine cautions given to patient.

## 2023-09-16 ENCOUNTER — Ambulatory Visit: Attending: Family Medicine

## 2023-09-16 DIAGNOSIS — R55 Syncope and collapse: Secondary | ICD-10-CM

## 2023-09-16 LAB — ECHOCARDIOGRAM COMPLETE
AR max vel: 2.5 cm2
AV Area VTI: 2.43 cm2
AV Area mean vel: 2.35 cm2
AV Mean grad: 3 mmHg
AV Peak grad: 6.3 mmHg
Ao pk vel: 1.25 m/s
Area-P 1/2: 4.39 cm2
S' Lateral: 2.81 cm

## 2023-09-21 ENCOUNTER — Encounter: Payer: Self-pay | Admitting: Family Medicine

## 2023-10-02 DIAGNOSIS — R55 Syncope and collapse: Secondary | ICD-10-CM | POA: Diagnosis not present

## 2023-10-05 DIAGNOSIS — R55 Syncope and collapse: Secondary | ICD-10-CM

## 2023-10-06 ENCOUNTER — Ambulatory Visit: Payer: Self-pay | Admitting: Family Medicine

## 2023-10-06 DIAGNOSIS — R55 Syncope and collapse: Secondary | ICD-10-CM

## 2023-10-06 MED ORDER — METOPROLOL TARTRATE 25 MG PO TABS: 12.5000 mg | ORAL_TABLET | Freq: Two times a day (BID) | ORAL | 1 refills | Status: DC | PRN

## 2023-10-09 ENCOUNTER — Ambulatory Visit: Payer: Self-pay

## 2023-10-09 NOTE — Telephone Encounter (Signed)
 Patient's questions were answered by provider office. Patient was calling back to return previous RN's phone call. No needs at time time.  Reason for Disposition  Health Information question, no triage required and triager able to answer question  Answer Assessment - Initial Assessment Questions 1. REASON FOR CALL or QUESTION: "What is your reason for calling today?" or "How can I best help you?" or "What question do you have that I can help answer?"     Patient calling back to return a phone call. Patient got her questions answered by provider office. No other concerns at this time.  Protocols used: Information Only Call - No Triage-A-AH

## 2023-10-09 NOTE — Telephone Encounter (Signed)
 This RN made first attempt to triage patient. No answer, LVM. Routing for additional attempts.   CRM # [id number] Patient called and request to speak to Dr Harrel Lim Nurse. States she was referred to Cardiologist but is unable to get an appt before 30 days and wants provider know let her know what she should do. She also had questions about a medication he recently prescribed. Did not provide the name of the medication. Thank You

## 2023-10-09 NOTE — Telephone Encounter (Signed)
 Pt called NT, disconnected during transfer, RN attempted CB. LVM.

## 2023-10-14 ENCOUNTER — Other Ambulatory Visit: Payer: Self-pay | Admitting: Family Medicine

## 2023-11-17 ENCOUNTER — Ambulatory Visit: Attending: Cardiology | Admitting: Cardiology

## 2023-11-17 ENCOUNTER — Encounter: Payer: Self-pay | Admitting: Cardiology

## 2023-11-17 VITALS — BP 122/74 | HR 67 | Ht 70.0 in | Wt 208.2 lb

## 2023-11-17 DIAGNOSIS — R55 Syncope and collapse: Secondary | ICD-10-CM

## 2023-11-17 DIAGNOSIS — R42 Dizziness and giddiness: Secondary | ICD-10-CM

## 2023-11-17 NOTE — Patient Instructions (Signed)
 Medication Instructions:  Your physician recommends that you continue on your current medications as directed. Please refer to the Current Medication list given to you today.   *If you need a refill on your cardiac medications before your next appointment, please call your pharmacy*  Lab Work: No labs ordered today  If you have labs (blood work) drawn today and your tests are completely normal, you will receive your results only by: MyChart Message (if you have MyChart) OR A paper copy in the mail If you have any lab test that is abnormal or we need to change your treatment, we will call you to review the results.  Testing/Procedures: No test ordered today   Follow-Up: At [facility], you and your health needs are our priority.  As part of our continuing mission to provide you with exceptional heart care, our providers are all part of one team.  This team includes your primary Cardiologist (physician) and Advanced Practice Providers or APPs (Physician Assistants and Nurse Practitioners) who all work together to provide you with the care you need, when you need it.  Your next appointment:   As needed  Provider:   You may see Dr. Darliss or one of the following Advanced Practice Providers on your designated Care Team:   Lonni Meager, NP Lesley Maffucci, PA-C Bernardino Bring, PA-C Cadence [place], PA-C Tylene Lunch, NP Barnie Hila, NP    We recommend signing up for the patient portal called MyChart.  Sign up information is provided on this After Visit Summary.  MyChart is used to connect with patients for Virtual Visits (Telemedicine).  Patients are able to view lab/test results, encounter notes, upcoming appointments, etc.  Non-urgent messages can be sent to your provider as well.   To learn more about what you can do with MyChart, go to [email or web address].   Other Instructions  Vasovagal response

## 2023-11-17 NOTE — Progress Notes (Signed)
 Cardiology Office Note:    Date:  11/17/2023   ID:  Jennifer Frank, DOB 1950/07/12, MRN [id number]  PCP:  [name], MD   [facility] Providers Cardiologist:  None     Referring MD: [name], MD   Chief Complaint  Patient presents with   Establish Care    New pt has been doing well with no complaints of chest pain, chest pressure or SOB, has has syncope when changing position , also feels heart racing and feels like she has to hurry up,  medication reviewed verbally with patient    History of Present Illness:    Jennifer Frank is a 73 y.o. female with a hx of GERD who presents due to syncope.  Patient has syncopal episodes ongoing over the past 1 to 2 years.  She walks with a walker sometimes due to lower extremity weakness.  Symptoms of dizziness are sometimes associated with warning signals including nausea, room spinning prior to passing out.  Standing up from seated position and bending sometimes causes dizziness.  Cardiac monitor placed last month showed occasional nonsustained SVT, no sustained arrhythmias.  Echocardiogram 08/2023 showed normal EF.  No significant structural abnormalities.  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   Fundic gland polyps of stomach, benign    GERD (gastroesophageal reflux disease)    past hx    Hearing loss    IBS (irritable bowel syndrome)    Personal history of colonic polyps - adenoma 09/22/2013   PONV (postoperative nausea and vomiting)    Reflux     Past Surgical History:  Procedure Laterality Date   HIP ARTHROSCOPY Left 1997   KNEE ARTHROSCOPY Left 1996   KNEE ARTHROSCOPY Right    05-2016 same time as left knee replacement    POLYPECTOMY     TONSILLECTOMY AND ADENOIDECTOMY  1957   TOTAL KNEE ARTHROPLASTY Bilateral 05/27/2016   Procedure: LEFT TOTAL KNEE ARTHROPLASTY WITH RIGHT KNEE ARTHROSCOPY;  Surgeon: Dempsey Sensor, MD;  Location: [facility] OR;  Service: Orthopedics;  Laterality: Bilateral;   UPPER GASTROINTESTINAL ENDOSCOPY     WISDOM TOOTH EXTRACTION      Current Medications: Current Meds  Medication Sig   betamethasone  valerate (VALISONE ) 0.1 % cream Apply topically daily as needed.   Cholecalciferol (VITAMIN D3) 50 MCG (2000 UT) capsule Take 1 capsule (2,000 Units total) by mouth daily.   furosemide  (LASIX ) 20 MG tablet TAKE 1 TABLET (20 MG TOTAL) BY MOUTH DAILY AS NEEDED FOR FLUID.   metoprolol  tartrate (LOPRESSOR ) 25 MG tablet Take 0.5-1 tablets (12.5-25 mg total) by mouth 2 (two) times daily as needed (For heart racing).   mirabegron  ER (MYRBETRIQ ) 25 MG TB24 tablet Take 1 tablet (25 mg total) by mouth daily.   pantoprazole  (PROTONIX ) 20 MG tablet Take 1 tablet (20 mg total) by mouth daily.   predniSONE  (DELTASONE ) 20 MG tablet Take 2 a day for 5 days, then 1 a day for 5 days, with food. Don't take with aleve/ibuprofen.   traZODone  (DESYREL ) 50 MG tablet TAKE 0.5-1 TABLETS BY MOUTH AT BEDTIME AS NEEDED FOR SLEEP.     Allergies:   Codeine, Darvocet [propoxyphene n-acetaminophen ], Morphine  and codeine, Oxybutynin , and Vioxx [rofecoxib]   Social History   Socioeconomic History   Marital status: Married    Spouse name: Not on file   Number of children: 2  Years of education: Not on file   Highest education level: Not on file  Occupational History   Occupation: Retired    Associate Professor: RETIRED    Comment: Pensions consultant  Tobacco Use   Smoking status: Never   Smokeless tobacco: Never  Vaping Use   Vaping status: Never Used  Substance and Sexual Activity   Alcohol use: Not Currently    Alcohol/week: 1.0 standard drink of alcohol    Types: 1 Glasses of wine per week    Comment: rarely   Drug use: No   Sexual activity: Not Currently  Other Topics Concern   Not on file  Social History Narrative   Married since 1975   2 kids   Right handed   Social Drivers of Health   Financial  Resource Strain: Low Risk  (02/05/2023)   Overall Financial Resource Strain (CARDIA)    Difficulty of Paying Living Expenses: Not hard at all  Food Insecurity: No Food Insecurity (02/05/2023)   Hunger Vital Sign    Worried About Running Out of Food in the Last Year: Never true    Ran Out of Food in the Last Year: Never true  Transportation Needs: No Transportation Needs (02/05/2023)   PRAPARE - Administrator, Civil Service (Medical): No    Lack of Transportation (Non-Medical): No  Physical Activity: Inactive (02/05/2023)   Exercise Vital Sign    Days of Exercise per Week: 0 days    Minutes of Exercise per Session: 0 min  Stress: No Stress Concern Present (02/05/2023)   [employer] of Occupational Health - Occupational Stress Questionnaire    Feeling of Stress : Not at all  Social Connections: Socially Integrated (02/05/2023)   Social Connection and Isolation Panel    Frequency of Communication with Friends and Family: More than three times a week    Frequency of Social Gatherings with Friends and Family: More than three times a week    Attends Religious Services: More than 4 times per year    Active Member of [employer] or Organizations: Yes    Attends Engineer, structural: More than 4 times per year    Marital Status: Married     Family History: The patient's family history includes AAA (abdominal aortic aneurysm) in her father; Bleeding Disorder in her father and mother; Breast cancer in her paternal aunt; Cancer in her brother; Colon cancer (age of onset: 45) in her brother; Colon polyps in her brother, father, mother, and son; Crohn's disease in her son; Deep vein thrombosis in her father and mother; Heart attack in her father and mother; Heart disease in her father and mother; Hyperlipidemia in her brother, father, and mother; Hypertension in her father and mother; Other in her mother; Parkinson's disease in her paternal uncle; Peripheral vascular disease in her  mother. There is no history of Esophageal cancer, Stomach cancer, or Rectal cancer.  ROS:   Please see the history of present illness.     All other systems reviewed and are negative.  EKGs/Labs/Other Studies Reviewed:    The following studies were reviewed today:  EKG Interpretation Date/Time:  Monday November 17 2023 09:41:13 EDT Ventricular Rate:  67 PR Interval:  138 QRS Duration:  88 QT Interval:  410 QTC Calculation: 433 R Axis:   2  Text Interpretation: Normal sinus rhythm Inferior infarct Cannot rule out Anterior infarct Confirmed by Darliss Rogue ([id number]) on 11/17/2023 10:16:20 AM    Recent Labs: 09/11/2023: ALT 12; BUN 15; Creatinine,  Ser 0.85; Hemoglobin 12.6; Platelets 283.0; Potassium 4.1; Sodium 137; TSH 1.63  Recent Lipid Panel    Component Value Date/Time   CHOL 236 (H) 03/04/2023 0850   TRIG 121.0 03/04/2023 0850   TRIG 77 06/03/2006 0922   HDL 66.00 03/04/2023 0850   CHOLHDL 4 03/04/2023 0850   VLDL 24.2 03/04/2023 0850   LDLCALC 146 (H) 03/04/2023 0850   LDLDIRECT 133.0 02/26/2012 1501     Risk Assessment/Calculations:             Physical Exam:    VS:  BP 122/74 (BP Location: Left Arm, Patient Position: Sitting)   Pulse 67   Ht 5' 10 (1.778 m)   Wt 208 lb 3.2 oz (94.4 kg)   SpO2 97%   BMI 29.87 kg/m     Wt Readings from Last 3 Encounters:  11/17/23 208 lb 3.2 oz (94.4 kg)  09/11/23 214 lb (97.1 kg)  07/08/23 216 lb 3.2 oz (98.1 kg)     GEN:  Well nourished, well developed in no acute distress HEENT: Normal NECK: No JVD; No carotid bruits CARDIAC: RRR, no murmurs, rubs, gallops RESPIRATORY:  Clear to auscultation without rales, wheezing or rhonchi  ABDOMEN: Soft, non-tender, non-distended MUSCULOSKELETAL:  No edema; No deformity  SKIN: Warm and dry NEUROLOGIC:  Alert and oriented x 3 PSYCHIATRIC:  Normal affect   ASSESSMENT:    1. Syncope, vasovagal   2. Vertigo    PLAN:    In order of problems listed above:  Syncope,  orthostatic vitals today with no evidence for orthostasis.  Syncope typically has prodromal symptoms including dizziness, nausea prior to patient passing out.  Also has a component of positional vertigo such as getting dizzy with standing, bending.  Cardiac workup with echocardiogram and cardiac monitor was unrevealing.  Nonsustained SVT noted on cardiac monitor will not cause patient's symptoms.  No indication for beta-blocker.  Follow-up with PCP and/or ENT regarding vertigo.  She was advised on safety precautions such as sitting when prodromal symptoms arise.  Her baseline weakness in her lower extremities also increases her risk for falls.  Follow-up as needed      Medication Adjustments/Labs and Tests Ordered: Current medicines are reviewed at length with the patient today.  Concerns regarding medicines are outlined above.  Orders Placed This Encounter  Procedures   EKG 12-Lead   No orders of the defined types were placed in this encounter.   Patient Instructions  Medication Instructions:  Your physician recommends that you continue on your current medications as directed. Please refer to the Current Medication list given to you today.   *If you need a refill on your cardiac medications before your next appointment, please call your pharmacy*  Lab Work: No labs ordered today  If you have labs (blood work) drawn today and your tests are completely normal, you will receive your results only by: MyChart Message (if you have MyChart) OR A paper copy in the mail If you have any lab test that is abnormal or we need to change your treatment, we will call you to review the results.  Testing/Procedures: No test ordered today   Follow-Up: At [facility], you and your health needs are our priority.  As part of our continuing mission to provide you with exceptional heart care, our providers are all part of one team.  This team includes your primary Cardiologist (physician) and  Advanced Practice Providers or APPs (Physician Assistants and Nurse Practitioners) who all work together to  provide you with the care you need, when you need it.  Your next appointment:   As needed  Provider:   You may see Dr. Darliss or one of the following Advanced Practice Providers on your designated Care Team:   Lonni Meager, NP Lesley Maffucci, PA-C Bernardino Bring, PA-C Cadence [place], PA-C Tylene Lunch, NP Barnie Hila, NP    We recommend signing up for the patient portal called MyChart.  Sign up information is provided on this After Visit Summary.  MyChart is used to connect with patients for Virtual Visits (Telemedicine).  Patients are able to view lab/test results, encounter notes, upcoming appointments, etc.  Non-urgent messages can be sent to your provider as well.   To learn more about what you can do with MyChart, go to [email or web address].   Other Instructions  Vasovagal response        Signed, Redell Darliss, MD  11/17/2023 11:57 AM    [facility]

## 2023-11-21 ENCOUNTER — Other Ambulatory Visit: Payer: Self-pay | Admitting: Family Medicine

## 2023-11-25 NOTE — Telephone Encounter (Signed)
 Please check with patient.  Cardiology noted that she would not have to take a beta-blocker, at least related to syncope.  If she still having persistent heart rate elevations then she could use a beta-blocker for that.  I sent the prescription.  Please let me know how she is feeling, if she has an elevated heart rate, and if she is having to take metoprolol .  Thanks.

## 2023-11-28 NOTE — Telephone Encounter (Signed)
 Left voicemail for patient to return call to office.

## 2023-12-01 ENCOUNTER — Telehealth: Payer: Self-pay

## 2023-12-01 NOTE — Telephone Encounter (Signed)
 Copied from CRM [id number]. Topic: Appointments - Scheduling Inquiry for Clinic >> Dec 01, 2023  3:24 PM Jennifer Frank wrote: Reason for CRM: Patient is calling to schedule for her and her husband Jennifer Frank) to have back to back physical appointments so they would only have to come one time. Patients husband is scheduled for 10/27 at 11:00, and the patient would like the 10/27 at 11:30, when going to schedule the appointment it showed an error due to the template set up. Please advise patient if she would be able to schedule at 11:30 or a different day that would work for them to come in at the same time. Patient prefers Monday or Thursday mornings.  Is it okay to schedule both cpe visits back to back?

## 2023-12-01 NOTE — Telephone Encounter (Signed)
 Called patient left message to call office due for CPE after 03/11/23.

## 2023-12-01 NOTE — Telephone Encounter (Signed)
 Please call patient and schedule her to come in after spouse on 10/27 at 11:30am

## 2023-12-02 NOTE — Telephone Encounter (Signed)
 Sch cpe for 10/27 @ 11:30am

## 2023-12-03 ENCOUNTER — Other Ambulatory Visit: Payer: Self-pay | Admitting: Family Medicine

## 2023-12-03 DIAGNOSIS — R42 Dizziness and giddiness: Secondary | ICD-10-CM

## 2023-12-03 NOTE — Telephone Encounter (Signed)
 I put in the referral for ENT.  Have her let us  know if she cannot get scheduled.  Thanks.

## 2023-12-03 NOTE — Telephone Encounter (Signed)
 Reached out to patient and she advises that she is not experiencing any issues. When she saw cardiology and they determined it was not her heart they did say that she may have vertigo and that a referral to ENT may help. She is requesting that a referral be placed. She will not take the lopressor  unless she needs to. But so far she hasn't needed to take.

## 2023-12-08 DIAGNOSIS — H6123 Impacted cerumen, bilateral: Secondary | ICD-10-CM | POA: Diagnosis not present

## 2023-12-08 DIAGNOSIS — H60543 Acute eczematoid otitis externa, bilateral: Secondary | ICD-10-CM | POA: Diagnosis not present

## 2023-12-08 DIAGNOSIS — R42 Dizziness and giddiness: Secondary | ICD-10-CM | POA: Diagnosis not present

## 2023-12-08 DIAGNOSIS — H903 Sensorineural hearing loss, bilateral: Secondary | ICD-10-CM | POA: Diagnosis not present

## 2023-12-10 ENCOUNTER — Other Ambulatory Visit: Payer: Self-pay

## 2023-12-10 ENCOUNTER — Ambulatory Visit: Attending: Unknown Physician Specialty | Admitting: Physical Therapy

## 2023-12-10 ENCOUNTER — Encounter: Payer: Self-pay | Admitting: Physical Therapy

## 2023-12-10 DIAGNOSIS — R42 Dizziness and giddiness: Secondary | ICD-10-CM | POA: Diagnosis not present

## 2023-12-10 DIAGNOSIS — R296 Repeated falls: Secondary | ICD-10-CM | POA: Diagnosis not present

## 2023-12-10 DIAGNOSIS — M6281 Muscle weakness (generalized): Secondary | ICD-10-CM | POA: Diagnosis not present

## 2023-12-10 DIAGNOSIS — R2681 Unsteadiness on feet: Secondary | ICD-10-CM | POA: Diagnosis not present

## 2023-12-10 NOTE — Therapy (Signed)
 OUTPATIENT PHYSICAL THERAPY VESTIBULAR EVALUATION     Patient Name: Jennifer Frank MRN: [id number] DOB:1950/09/28, 73 y.o., female Today's Date: 12/10/2023  END OF SESSION:   PT End of Session - 12/10/23 1147     Visit Number 1    Number of Visits 24    Date for PT Re-Evaluation 03/03/24    PT Start Time 1145    PT Stop Time 1239    PT Time Calculation (min) 54 min    Equipment Utilized During Treatment Gait belt    Activity Tolerance Patient tolerated treatment well    Behavior During Therapy WFL for tasks assessed/performed          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   Fundic gland polyps of stomach, benign    GERD (gastroesophageal reflux disease)    past hx    Hearing loss    IBS (irritable bowel syndrome)    Personal history of colonic polyps - adenoma 09/22/2013   PONV (postoperative nausea and vomiting)    Reflux    Past Surgical History:  Procedure Laterality Date   HIP ARTHROSCOPY Left 1997   KNEE ARTHROSCOPY Left 1996   KNEE ARTHROSCOPY Right    05-2016 same time as left knee replacement    POLYPECTOMY     TONSILLECTOMY AND ADENOIDECTOMY  1957   TOTAL KNEE ARTHROPLASTY Bilateral 05/27/2016   Procedure: LEFT TOTAL KNEE ARTHROPLASTY WITH RIGHT KNEE ARTHROSCOPY;  Surgeon: Dempsey Sensor, MD;  Location: [facility] OR;  Service: Orthopedics;  Laterality: Bilateral;   UPPER GASTROINTESTINAL ENDOSCOPY     WISDOM TOOTH EXTRACTION     Patient Active Problem List   Diagnosis Date Noted   Syncope 09/14/2023   Other social stressor 07/09/2023   Insomnia 07/09/2023   Back pain 03/12/2023   Skin irritation 03/12/2023   Tremor 09/08/2022   Osteopenia 02/11/2022   SOB (shortness of breath) 05/27/2021   Aortic atherosclerosis (HCC) 03/12/2020   Medicare annual wellness visit, subsequent 01/10/2019   Advance care planning 12/14/2017   Urinary incontinence 12/14/2017   Primary  osteoarthritis of left knee 05/26/2016   Health care maintenance 04/22/2016   Hearing loss 04/22/2014   Varicose veins of bilateral lower extremities with other complications 01/25/2013   Edema 01/25/2013   Hyperlipidemia 02/16/2007   GERD 02/16/2007    PCP: [name], MD REFERRING PROVIDER: [name], MD   REFERRING DIAG: R42 (ICD-10-CM) - Dizziness   THERAPY DIAG:  Dizziness and giddiness  Unsteadiness on feet  Muscle weakness (generalized)  Repeated falls  ONSET DATE: Around February 2025  Rationale for Evaluation and Treatment: Rehabilitation  SUBJECTIVE:   SUBJECTIVE STATEMENT:  Pt states 2 weeks ago her PCP, Dr. Cleatus, referred her to cardiology and they did an EKG showing normal sinus rhythm. States she did wear heart monitor for 7 days with potential abnormal rhythm at night of tachycardia. Pt states cardiologist felt she had vertigo and referred her to ENT. Pt states a few years ago she was told she had loss of hearing. Pt states she has learned to lip read really well since then while she saves up money to afford hearing aides. States she visited ENT and had debridement of her ears and they confirmed she needs hearing aides. Pt states ENT didn't see vertigo and referred her to vestibular rehab.   Pt states she has frequent falls and fell this morning  when getting up from seated position with L LOB. States she often falls to her LEFT, but sometimes face forward. States when she stands up she has to stand for a little while because if she moves too quick everything is revolving/spinning and she gets nauseous.   Pt states last year or beginning of this year she had EMG and MRI, then was referred to PT for strength training. Pt states imaging found lumbar spine has bulging discs and that the physical therapist at the time discontinued care due to concern for exacerbating her lumbar spine symptoms. Pt states she did her exercises for a little while, but then  gradually stopped doing them.  Pt states she uses rollator when her husband is not with her for security and to have seat in tow. Pt states she has sudden onset anxious feeling with no known provocation and then she starts pushing AD too far out in front of her causing instability.   Pt says she has tremors in R hand and she was assessed for Parkinson's due to having family hx on her dad's side. Pt states she saw Neurologist last year and they said she didn't have Parkinson's. Pt also reports having quivering in R LE.  Pt reports this morning she had some numbness down her R UE down to her middle finger, but it is gone now.  Pt states if she stands with both of her hands up over her head she will blackout and pass out because she was doing a task overhead years ago and this happened. States she can raise 1 arm overhead and she is OK.  Pt accompanied by: self, husband in waiting room [name])  Per ENT note on July 21st, 2025: Patient was referred to ENT by Arlyss Solian. Pt having vertigo described as dizziness, imbalance, light-headedness, and nausea. Pt has associated nausea and vomiting. Vertigo has been present for 6 months. Typically having episodes daily. Vertigo developed suddenly. The vertigo is positional, occurring while looking up, standing up, lying flat, and upon getting up. Episodes typically last for seconds. The symptoms are made worse by movement. ENT performed bilateral cerumen debridement with microscopy on 12/08/2023. Hall pike was negative bilaterally by ENT. Planning for VNG testing and referred patient to vestibular rehab. Therapy ordered: Vestibular rehabilitation, Habituation exercises, and Substitution exercises. 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 (See chart for full details)  Per cardiology note on 11/17/2023: Patient has syncopal episodes ongoing over the past 1 to 2 years. She walks with a walker sometimes due to lower extremity weakness.  Cardiac monitor placed last month showed occasional nonsustained SVT, no sustained arrhythmias. Echocardiogram 08/2023 showed normal EF. No significant structural abnormalities. Orthostatic vitals today with no evidence for orthostasis.    PERTINENT HISTORY: Arthritis, clotting disorder, GERD, IBS, L TKA, spastic bladder  PAIN:  Are you having pain? No  PRECAUTIONS: Fall  RED FLAGS: None   WEIGHT BEARING RESTRICTIONS: No  FALLS: Has patient fallen in last 6 months? Yes. Number of falls 10+ with pt having a fall this morning  LIVING ENVIRONMENT: Lives with: lives with their family and lives with their spouse Lives in: House/apartment Stairs: need to ask Has following equipment at home: Vannie - 4 wheeled, which pt uses if she doesn't have her husband's support  PLOF: Independent with basic ADLs, Independent with household mobility without device, Independent with community mobility without device, and Independent with homemaking with ambulation  PATIENT GOALS: improve dizziness, decrease number of falls, improve balance  OBJECTIVE:  Note: Objective measures were completed at Evaluation unless otherwise noted.  DIAGNOSTIC FINDINGS:  EXAM: LUMBAR SPINE - COMPLETE 4+ VIEW COMPARISON:  MRI of lumbar spine December 06, 2022 FINDINGS: There is no evidence of lumbar spine fracture. Alignment is normal. Mild narrow intervertebral spaces and mild anterior spurring are identified in the upper lumbar spine. Bilateral hip replacements are noted. Minimal facet joint sclerosis are noted in the mid to lower lumbar spine.   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  Electronically Signed   By: Craig Farr M.D.   On: 09/18/2023 14:52  COGNITION: Overall cognitive status: Within functional limits for tasks assessed   SENSATION: Not tested  EDEMA:  Not formally measured, but has pitting edema in bilateral lower legs (pt reports being on fluid pill) Pt states she  usually wears compression stockings 2-3x/week (not wearing today)  MUSCLE TONE:  Not formally assessed  DTRs:  Achilles: need to assess  POSTURE:  rounded shoulders, forward head, and posterior pelvic tilt  Cervical ROM:    Active Not formally measured, but observation of movement noted A/PROM (deg) eval  Flexion   Extension Decreased from normal   Right lateral flexion   Left lateral flexion   Right rotation   Left rotation Decreased compared to R  (Blank rows = not tested)  STRENGTH: Not formally assessed  LOWER EXTREMITY MMT:   MMT  Need to be assessed  Right eval Left eval  Hip flexion Functionally observed impairment with pt requiring use of UEs to lift LEs onto mat Functionally observed impairment with pt requiring use of UEs to lift LEs onto mat  Hip abduction    Hip adduction    Hip internal rotation    Hip external rotation    Knee flexion    Knee extension    Ankle dorsiflexion    Ankle plantarflexion    Ankle inversion    Ankle eversion    (Blank rows = not tested)  BED MOBILITY:  Sit to supine Modified independence Supine to sit Modified independence *Patient uses UEs to assist with LE management on/off the mat  TRANSFERS: Assistive device utilized: HHA  Sit to stand: CGA Stand to sit: CGA Chair to chair: CGA Floor: not assessed  RAMP: not assessed  CURB: not assessed  GAIT: Gait pattern: pt reports often feeling she veers towards the LEFT, decreased arm swing- Right, decreased arm swing- Left, decreased step length- Right, decreased step length- Left, and wide BOS Distance walked: ~136ft Assistive device utilized: L HHA vs none Level of assistance: CGA Comments:   FUNCTIONAL TESTS:   10 meter walk test: need to assess  Berg Balance Test: need to assess  Functional Gait Assessment: need to assess   PATIENT SURVEYS:   Dizziness Handicap Inventory [facility]): need to assess   VESTIBULAR ASSESSMENT:    SYMPTOM  BEHAVIOR:  Subjective history: see above  Non-Vestibular symptoms: changes in hearing a few years ago, tinnitus, nausea but no vomiting, and hx of migraines years ago but never had to take medication for them  Type of dizziness: Spinning/Vertigo, Lightheadedness/Faint, and spinning when she stands up, states she has significant increased fatigue  Frequency: change positions (lying to sitting, but mostly sit to stand)  Duration: <27minute  Aggravating factors: Induced by position change: supine to sit and sit to stand  Relieving factors: slow movements and trying to stand before starting to walk  Progression of symptoms: slight increase/worsening of symptoms since February  OCULOMOTOR EXAM:  Ocular Alignment: normal  Ocular ROM: No Limitations  Spontaneous Nystagmus: absent  Gaze-Induced Nystagmus: absent  Smooth Pursuits: impaired with some difficulty tracking, especially when attempting to perform more circular smooth pursuits with pt reporting this causes her to feel dizzy  Saccades: hypometric/undershoots, hypermetric/overshoots, and slow  Convergence/Divergence: unable to converge eyes    VESTIBULAR - OCULAR REFLEX:   Slow VOR: Normal  VOR Cancellation: Corrective Saccades in both directions   Head-Impulse Test: HIT Right: positive HIT Left: positive  Dynamic Visual Acuity: Not able to be assessed   POSITIONAL TESTING:  Loaded Right Dix-Hallpike: no nystagmus, but positive for symptoms with delayed onset around ~30seconds and pt reporting this is her dizziness Loaded Left Dix-Hallpike: no nystagmus and negative for symptoms  MOTION SENSITIVITY:  Motion Sensitivity Quotient Intensity: 0 = none, 1 = Lightheaded, 2 = Mild, 3 = Moderate, 4 = Severe, 5 = Vomiting  Intensity  1. Sitting to supine   2. Supine to L side   3. Supine to R side   4. Supine to sitting   5. L Hallpike-Dix   6. Up from L    7. R Hallpike-Dix   8. Up from R    9. Sitting, head tipped to L knee   10.  Head up from L knee   11. Sitting, head tipped to R knee   12. Head up from R knee   13. Sitting head turns x5   14.Sitting head nods x5   15. In stance, 180 turn to L    16. In stance, 180 turn to R     OTHOSTATICS: not done, but would recommend assessing at future session                                                                                                                            TREATMENT DATE: 12/10/2023  Canalith Repositioning: 1x on R side  Educated pt on findings of vestibular testing and the treatment performed today. Provided pt with ANPT BPPV and After BPPV Maneuver handouts.   PATIENT EDUCATION: Education details: Findings of therapy assessment, vestibular testing, vestibular treatment, plan for upcoming sessions Person educated: Patient Education method: Explanation and Handouts Education comprehension: verbalized understanding and needs further education  HOME EXERCISE PROGRAM:  GOALS: Goals reviewed with patient? Yes  SHORT TERM GOALS: Target date: 01/21/2024   Patient will be independent in home exercise program to improve gaze stabilization and/or mobility for improved functional independence with ADLs while experiencing decreased dizziness symptoms. Baseline: need to initiate Goal status: INITIAL   LONG TERM GOALS: Target date: 03/03/2024  Patient will reduce dizziness handicap inventory [facility]) score to <36, for less dizziness with ADLs and increased safety with home and work tasks.  Baseline: need to assess Goal status: INITIAL  2.  Patient will deny any falls over past 4 weeks to demonstrate improved balance and safe independence with functional mobility.  Baseline: reports frequent falls with 1 on day of  initial eval Goal status: INITIAL  3.  Patient will increase Berg Balance score to > 51/56 to demonstrate improved balance and decreased fall risk during functional activities and ADLs.  Baseline: need to assess Goal status:  INITIAL  4.  Patient will increase 10 meter walk test to >1.55m/s using LRAD as to improve gait speed for better community ambulation and to reduce fall risk. Baseline: need to assess Goal status: INITIAL  5.  Patient will increase Functional Gait Assessment (FGA) score to >20/30 as to reduce fall risk and improve dynamic gait safety with community ambulation.  Baseline: need to assess Goal status: INITIAL  ASSESSMENT:  CLINICAL IMPRESSION: Patient is a 73 y.o. female who was seen today for physical therapy evaluation and treatment for dizziness and imbalance with frequent falls. Patient describes dizziness symptoms as spinning that occurs primarily when transitioning from sitting to standing, but also during supine to sitting positional changes. Patient reports symptoms last < 1 minute and they frequently cause falls with pt having LOB primarily towards left. Patient experienced a fall this morning with no injury. Patient has been seen by cardiology and ENT who recommended patient have VNG testing. Patient reports using a rollator for mobility when she doesn't have her husband's support. Patient demonstrates very slow gait with increased instability and wide BOS requiring CGA or HHA for steadying. During oculomotor exam, patient demonstrates impaired smooth pursuits and saccades as well as inability to perform convergence. Patient also has corrective saccades during HIT testing bilaterally and corrective saccades during VOR cancellation test. Patient also reports her spinning dizziness symptom during Loaded Right Dix-Hallpike test without visible nystagmus, likely due to pt able to fixate gaze in room light. Performed 1x R CRM treatment today. The pt will benefit from further skilled PT to improve positional and movement evoked dizziness and balance in order to return pt to PLOF an increase QOL.  OBJECTIVE IMPAIRMENTS: Abnormal gait, cardiopulmonary status limiting activity, decreased activity  tolerance, decreased balance, decreased coordination, decreased endurance, decreased knowledge of condition, decreased knowledge of use of DME, decreased mobility, difficulty walking, decreased strength, and dizziness.   ACTIVITY LIMITATIONS: carrying, lifting, bending, standing, squatting, transfers, bed mobility, bathing, toileting, dressing, reach over head, hygiene/grooming, and locomotion level  PARTICIPATION LIMITATIONS: meal prep, cleaning, laundry, shopping, community activity, and yard work  PERSONAL FACTORS: Age, Time since onset of injury/illness/exacerbation, and 3+ comorbidities: Arthritis, clotting disorder, GERD, IBS, L TKA, spastic bladder are also affecting patient's functional outcome.   REHAB POTENTIAL: Good  CLINICAL DECISION MAKING: Evolving/moderate complexity  EVALUATION COMPLEXITY: Moderate   PLAN:  PT FREQUENCY: 1-2x/week  PT DURATION: 12 weeks  PLANNED INTERVENTIONS: [id number]- PT Re-evaluation, 97750- Physical Performance Testing, 97110-Therapeutic exercises, 97530- Therapeutic activity, [id number]- Neuromuscular re-education, 97535- Self Care, [id number]- Manual therapy, [id number]- Gait training, [phone]- Orthotic Initial, [phone]- Orthotic/Prosthetic subsequent, [phone]- Canalith repositioning, Patient/Family education, Balance training, Stair training, Taping, Joint mobilization, Spinal mobilization, Vestibular training, Visual/preceptual remediation/compensation, DME instructions, Cryotherapy, Moist heat, and Biofeedback  PLAN FOR UPCOMING SESSIONS:  - assess LE strength and sensation - assess DTRs - Dizziness Handicap Inventory (DHI) - re-test R Loaded Dix-Hallpike - Berg Balance Test - - initiate gaze stabilization exercises  - X1 and VOR cancellation  - convergence exercise - educate on sign/symptoms of CVA - orthostatic hypotension assessment    Providence Stivers, PT, DPT, NCS, CSRS Physical Therapist - [place]  [facility]  2:26  PM 12/10/23

## 2023-12-17 ENCOUNTER — Ambulatory Visit: Admitting: Physical Therapy

## 2023-12-23 ENCOUNTER — Ambulatory Visit: Attending: Unknown Physician Specialty

## 2023-12-23 DIAGNOSIS — R29898 Other symptoms and signs involving the musculoskeletal system: Secondary | ICD-10-CM | POA: Diagnosis not present

## 2023-12-23 DIAGNOSIS — R296 Repeated falls: Secondary | ICD-10-CM | POA: Insufficient documentation

## 2023-12-23 DIAGNOSIS — M6281 Muscle weakness (generalized): Secondary | ICD-10-CM | POA: Diagnosis not present

## 2023-12-23 DIAGNOSIS — R42 Dizziness and giddiness: Secondary | ICD-10-CM | POA: Insufficient documentation

## 2023-12-23 DIAGNOSIS — R2681 Unsteadiness on feet: Secondary | ICD-10-CM | POA: Insufficient documentation

## 2023-12-23 NOTE — Therapy (Signed)
 OUTPATIENT PHYSICAL THERAPY TREATMENT  Patient Name: Jennifer Frank MRN: [id number] DOB:04-Jan-1951, 73 y.o., female Today's Date: 12/23/2023  END OF SESSION:   PT End of Session - 12/23/23 1155     Visit Number 2    Number of Visits 24    Date for PT Re-Evaluation 03/03/24    Authorization Type [employer] Medicare    Progress Note Due on Visit 10    PT Start Time 1145    PT Stop Time 1225    PT Time Calculation (min) 40 min    Activity Tolerance Patient tolerated treatment well;No increased pain    Behavior During Therapy WFL for tasks assessed/performed          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   Fundic gland polyps of stomach, benign    GERD (gastroesophageal reflux disease)    past hx    Hearing loss    IBS (irritable bowel syndrome)    Personal history of colonic polyps - adenoma 09/22/2013   PONV (postoperative nausea and vomiting)    Reflux    Past Surgical History:  Procedure Laterality Date   HIP ARTHROSCOPY Left 1997   KNEE ARTHROSCOPY Left 1996   KNEE ARTHROSCOPY Right    05-2016 same time as left knee replacement    POLYPECTOMY     TONSILLECTOMY AND ADENOIDECTOMY  1957   TOTAL KNEE ARTHROPLASTY Bilateral 05/27/2016   Procedure: LEFT TOTAL KNEE ARTHROPLASTY WITH RIGHT KNEE ARTHROSCOPY;  Surgeon: Dempsey Sensor, MD;  Location: [facility] OR;  Service: Orthopedics;  Laterality: Bilateral;   UPPER GASTROINTESTINAL ENDOSCOPY     WISDOM TOOTH EXTRACTION     Patient Active Problem List   Diagnosis Date Noted   Syncope 09/14/2023   Other social stressor 07/09/2023   Insomnia 07/09/2023   Back pain 03/12/2023   Skin irritation 03/12/2023   Tremor 09/08/2022   Osteopenia 02/11/2022   SOB (shortness of breath) 05/27/2021   Aortic atherosclerosis (HCC) 03/12/2020   Medicare annual wellness visit, subsequent 01/10/2019   Advance care planning 12/14/2017   Urinary incontinence  12/14/2017   Primary osteoarthritis of left knee 05/26/2016   Health care maintenance 04/22/2016   Hearing loss 04/22/2014   Varicose veins of bilateral lower extremities with other complications 01/25/2013   Edema 01/25/2013   Hyperlipidemia 02/16/2007   GERD 02/16/2007    PCP: [name], MD REFERRING PROVIDER: [name], MD   REFERRING DIAG: R42 (ICD-10-CM) - Dizziness   THERAPY DIAG:  Dizziness and giddiness  Unsteadiness on feet  Muscle weakness (generalized)  Repeated falls  Weakness of lower extremity, unspecified laterality  ONSET DATE: Around February 2025  Rationale for Evaluation and Treatment: Rehabilitation  SUBJECTIVE:   SUBJECTIVE STATEMENT: Pt reports significant improvement since last session, however she was very much symptomatic for a few hours thereafter. Only intermittent episodes since then, mostly upon standing, takes time prior to initiating    PERTINENT HISTORY: Pt states 2 weeks ago her PCP, Dr. Cleatus, referred her to cardiology and they did an EKG showing normal sinus rhythm. States she did wear heart monitor for 7 days with potential abnormal rhythm at night of tachycardia. Pt states cardiologist felt she had vertigo and referred her to ENT. Pt states a few years ago she was told she had loss of hearing. Pt states she has learned to lip read really well since then while she saves  up money to afford hearing aides. States she visited ENT and had debridement of her ears and they confirmed she needs hearing aides. Pt states ENT didn't see vertigo and referred her to vestibular rehab. Pt states she has frequent falls and fell this morning when getting up from seated position with L LOB. States she often falls to her LEFT, but sometimes face forward. States when she stands up she has to stand for a little while because if she moves too quick everything is revolving/spinning and she gets nauseous. Pt states last year or beginning of this year she  had EMG and MRI, then was referred to PT for strength training. Pt states imaging found lumbar spine has bulging discs and that the physical therapist at the time discontinued care due to concern for exacerbating her lumbar spine symptoms. Pt states she did her exercises for a little while, but then gradually stopped doing them. Pt states she uses rollator when her husband is not with her for security and to have seat in tow. Pt states she has sudden onset anxious feeling with no known provocation and then she starts pushing AD too far out in front of her causing instability.  Pt says she has tremors in R hand and she was assessed for Parkinson's due to having family hx on her dad's side. Pt states she saw Neurologist last year and they said she didn't have Parkinson's. Pt also reports having quivering in R LE. Pt states if she stands with both of her hands up over her head she will blackout and pass out because she was doing a task overhead years ago and this happened. States she can raise 1 arm overhead and she is OK  Per ENT note on July 21st, 2025: vertigo described as dizziness, imbalance, light-headedness, and nausea; bilateral cerumen debridement with microscopy on 12/08/2023. Hall pike was negative bilaterally by ENT. Planning for VNG testing and referred patient to vestibular rehab. Therapy ordered: Vestibular rehabilitation, Habituation exercises, and Substitution exercises. 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 (See chart for full details). Per cardiology note on 11/17/2023: syncopal episodes ongoing over the past 1 to 2 years. She walks with a walker sometimes due to lower extremity weakness. Orthostatic vitals today with no evidence for orthostasis. Arthritis, clotting disorder, GERD, IBS, L TKA, spastic bladder  PAIN:  Are you having pain? No  PRECAUTIONS: Fall  RED FLAGS: None   WEIGHT BEARING RESTRICTIONS: No  FALLS: Has patient fallen in last 6 months?  Yes. Number of falls 10+   LIVING ENVIRONMENT: Lives with: lives with their family and lives with their spouse Lives in: House/apartment  Has following equipment at home: Vannie - 4 wheeled, which pt uses if she doesn't have her husband's support  PLOF: Independent with basic ADLs, Independent with household mobility without device, Independent with community mobility without device, and Independent with homemaking with ambulation  PATIENT GOALS: improve dizziness, decrease number of falls, improve balance  OBJECTIVE:  Note: Objective measures were completed at evaluation unless otherwise noted.  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  (12/23/23)   [facility] PT Assessment - 12/23/23 0001       Functional Gait  Assessment   Gait assessed  Yes    Gait Level Surface Walks 20 ft in less than 5.5 sec, no assistive devices, good speed, no evidence for imbalance, normal gait pattern, deviates no more than 6 in outside of the 12 in walkway width.    Change in Gait Speed Makes only minor adjustments to walking speed, or  accomplishes a change in speed with significant gait deviations, deviates 10-15 in outside the 12 in walkway width, or changes speed but loses balance but is able to recover and continue walking.    Gait with Horizontal Head Turns Performs head turns with moderate changes in gait velocity, slows down, deviates 10-15 in outside 12 in walkway width but recovers, can continue to walk.   apprehensive with headturns, small amplitude only   Gait with Vertical Head Turns Performs task with moderate change in gait velocity, slows down, deviates 10-15 in outside 12 in walkway width but recovers, can continue to walk.   apprehensive with headturns, small amplitude only, 1 LOB   Gait and Pivot Turn Pivot turns safely in greater than 3 sec and stops with no loss of balance, or pivot turns safely within 3 sec and stops with mild imbalance, requires small steps to catch balance.    Step Over Obstacle Is able to step over one shoe box (4.5 in total height) without changing gait speed. No evidence of imbalance.    Gait with Narrow Base of Support Ambulates less than 4 steps heel to toe or cannot perform without assistance.    Gait with Eyes Closed Walks 20 ft, no assistive devices, good speed, no evidence of imbalance, normal gait pattern, deviates no more than 6 in outside 12 in walkway width. Ambulates 20 ft in less than 7 sec.   no LOB or deviation, but is anxious about falling   Ambulating Backwards Walks 20 ft, slow speed, abnormal gait pattern, evidence for imbalance, deviates 10-15 in outside 12 in walkway width.    Steps Two feet to a stair, must use rail.    Total Score 15                                                                                                                                   TREATMENT DATE: 12/23/2023 -orthostatic vital signs -retest Left dix hallpike: negative  *has continuous Right lateral beating nystagmus  in exam (pt asymptomatic)  -DHI survey: 48%  -FGA: see above (15)   *generally guarded in ad li b head movements, fairly restricted ROM as they remain somewhat provoking to symptoms.  -gaze stabilization with horizontal head turns x30sec, c vertical head turns x60sec (issued or HEP)    PATIENT EDUCATION: Education details: Findings of therapy assessment, vestibular testing, vestibular treatment, plan for upcoming sessions Person educated: Patient Education method: Explanation and Handouts Education comprehension: verbalized understanding and needs further education  HOME EXERCISE PROGRAM: Access Code: [email or web address] URL: [email or web address] Date: 12/23/2023 Prepared by: Peggye Linear  Exercises - Seated Gaze Stabilization with Head Rotation  - 3 x daily - 3 sets - 30sec hold - Seated Gaze Stabilization with Head Nod  - 3 x daily - 3 sets - 30sec hold - Sit to Stand with Arms Crossed  - 3 x daily - 1 sets - 10 reps  GOALS: Goals reviewed with patient? Yes  SHORT TERM GOALS: Target date: 01/21/2024   Patient will be independent in home exercise program to improve gaze stabilization and/or mobility for improved functional independence with ADLs while experiencing decreased dizziness symptoms. Baseline: need to initiate Goal status: INITIAL  LONG TERM GOALS: Target date: 03/03/2024  Patient will reduce dizziness handicap inventory [facility]) score to <36, for less dizziness with ADLs and increased safety with home and work tasks.  Baseline: 12/23/23: 48% Goal status: INITIAL  2.  Patient will deny any falls over past 4 weeks to demonstrate improved balance and safe independence with functional mobility.  Baseline: reports frequent falls with 1 on day of initial eval Goal status: INITIAL  3.  Patient will increase Berg Balance score to > 51/56 to demonstrate improved balance and decreased fall risk during functional activities and ADLs.  Baseline: defer to another measure, feel BBT would be more limited by chronic knee/hip DJD.  Goal status:  INITIAL  4.  Patient will increase 10 meter walk test to >1.49m/s using LRAD as to improve gait speed for better community ambulation and to reduce fall risk. Baseline: 12/23/23 0.62m/s  Goal status: INITIAL  5.  Patient will increase Functional Gait Assessment (FGA) score to >20/30 as to reduce fall risk and improve dynamic gait safety with community ambulation.  Baseline: 12/23/23: 15 Goal status: INITIAL  ASSESSMENT:  CLINICAL IMPRESSION: Continued with objective tests and measures remaining from evaluation visit, noted impairment on , FGA. Orthorstatic vitals signs negative as they were with cardiology, this despite most provoking symptoms as coming to standing. Retested posterior canal BPPV negative tests, also not symptomatic, however pt has continuous Rt beating horizontal nystagmus during supine, unchanged wth changes to head position. PT is not symptomatic during this observation. Began gaze stabilization exercises which are provoking moreso when vertical, however head movements remain guarded in general. Patient will benefit from skilled physical therapy intervention to reduce deficits and impairments identified in evaluation, in order to reduce pain, improve quality of life, and maximize activity tolerance for ADL, IADL, and leisure/fitness. Physical therapy will help pt achieve long and short term goals of care.   OBJECTIVE IMPAIRMENTS: Abnormal gait, cardiopulmonary status limiting activity, decreased activity tolerance, decreased balance, decreased coordination, decreased endurance, decreased knowledge of condition, decreased knowledge of use of DME, decreased mobility, difficulty walking, decreased strength, and dizziness.   ACTIVITY LIMITATIONS: carrying, lifting, bending, standing, squatting, transfers, bed mobility, bathing, toileting, dressing, reach over head, hygiene/grooming, and locomotion level  PARTICIPATION LIMITATIONS: meal prep, cleaning, laundry, shopping, community  activity, and yard work  PERSONAL FACTORS: Age, Time since onset of injury/illness/exacerbation, and 3+ comorbidities: Arthritis, clotting disorder, GERD, IBS, L TKA, spastic bladder are also affecting patient's functional outcome.   REHAB POTENTIAL: Good  CLINICAL DECISION MAKING: Evolving/moderate complexity  EVALUATION COMPLEXITY: Moderate  PLAN:  PT FREQUENCY: 1-2x/week  PT DURATION: 12 weeks  PLANNED INTERVENTIONS: 97164- PT Re-evaluation, 97750- Physical Performance Testing, 97110-Therapeutic exercises, 97530- Therapeutic activity, 97112- Neuromuscular re-education, 97535- Self Care, [id number]- Manual therapy, [phone]- Gait training, [phone]- Orthotic Initial, [phone]- Orthotic/Prosthetic subsequent, [phone]- Canalith repositioning, Patient/Family education, Balance training, Stair training, Taping, Joint mobilization, Spinal mobilization, Vestibular training, Visual/preceptual remediation/compensation, DME instructions, Cryotherapy, Moist heat, and Biofeedback  PLAN FOR UPCOMING SESSIONS:  - assess LE strength and sensation (if still indicated)  - assess DTR (if still indicated)  - educate on sign/symptoms of CVA (if still indicated)  - FU on HEP assignments from visit 2   3:48 PM, 12/23/23 Peggye JAYSON Linear, PT, DPT Physical Therapist - [facility]  Outpatient Physical Therapy- [facility] [phone]

## 2023-12-25 DIAGNOSIS — R42 Dizziness and giddiness: Secondary | ICD-10-CM | POA: Diagnosis not present

## 2023-12-26 ENCOUNTER — Ambulatory Visit: Admitting: Physical Therapy

## 2023-12-26 DIAGNOSIS — R42 Dizziness and giddiness: Secondary | ICD-10-CM

## 2023-12-26 DIAGNOSIS — R296 Repeated falls: Secondary | ICD-10-CM | POA: Diagnosis not present

## 2023-12-26 DIAGNOSIS — R29898 Other symptoms and signs involving the musculoskeletal system: Secondary | ICD-10-CM | POA: Diagnosis not present

## 2023-12-26 DIAGNOSIS — M6281 Muscle weakness (generalized): Secondary | ICD-10-CM

## 2023-12-26 DIAGNOSIS — R2681 Unsteadiness on feet: Secondary | ICD-10-CM

## 2023-12-26 NOTE — Therapy (Signed)
 OUTPATIENT PHYSICAL THERAPY VESTIBULAR TREATMENT     Patient Name: Jennifer Frank MRN: [id number] DOB:Aug 21, 1950, 73 y.o., female Today's Date: 12/26/2023  END OF SESSION:   PT End of Session - 12/26/23 0855     Visit Number 3    Number of Visits 24    Date for PT Re-Evaluation 03/03/24    Authorization Type [employer] Medicare    Progress Note Due on Visit 10    PT Start Time [id number]    PT Stop Time 0932    PT Time Calculation (min) 39 min    Equipment Utilized During Treatment Gait belt    Activity Tolerance Patient tolerated treatment well;No increased pain    Behavior During Therapy WFL for tasks assessed/performed          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   Fundic gland polyps of stomach, benign    GERD (gastroesophageal reflux disease)    past hx    Hearing loss    IBS (irritable bowel syndrome)    Personal history of colonic polyps - adenoma 09/22/2013   PONV (postoperative nausea and vomiting)    Reflux    Past Surgical History:  Procedure Laterality Date   HIP ARTHROSCOPY Left 1997   KNEE ARTHROSCOPY Left 1996   KNEE ARTHROSCOPY Right    05-2016 same time as left knee replacement    POLYPECTOMY     TONSILLECTOMY AND ADENOIDECTOMY  1957   TOTAL KNEE ARTHROPLASTY Bilateral 05/27/2016   Procedure: LEFT TOTAL KNEE ARTHROPLASTY WITH RIGHT KNEE ARTHROSCOPY;  Surgeon: Dempsey Sensor, MD;  Location: [facility] OR;  Service: Orthopedics;  Laterality: Bilateral;   UPPER GASTROINTESTINAL ENDOSCOPY     WISDOM TOOTH EXTRACTION     Patient Active Problem List   Diagnosis Date Noted   Syncope 09/14/2023   Other social stressor 07/09/2023   Insomnia 07/09/2023   Back pain 03/12/2023   Skin irritation 03/12/2023   Tremor 09/08/2022   Osteopenia 02/11/2022   SOB (shortness of breath) 05/27/2021   Aortic atherosclerosis (HCC) 03/12/2020   Medicare annual wellness visit, subsequent 01/10/2019    Advance care planning 12/14/2017   Urinary incontinence 12/14/2017   Primary osteoarthritis of left knee 05/26/2016   Health care maintenance 04/22/2016   Hearing loss 04/22/2014   Varicose veins of bilateral lower extremities with other complications 01/25/2013   Edema 01/25/2013   Hyperlipidemia 02/16/2007   GERD 02/16/2007    PCP: [name], MD REFERRING PROVIDER: [name], MD   REFERRING DIAG: R42 (ICD-10-CM) - Dizziness   THERAPY DIAG:  Dizziness and giddiness  Unsteadiness on feet  Muscle weakness (generalized)  Repeated falls  Weakness of lower extremity, unspecified laterality  ONSET DATE: Around February 2025  Rationale for Evaluation and Treatment: Rehabilitation  SUBJECTIVE:   SUBJECTIVE STATEMENT:  Pt reports she has been trying to do her HEP, but has difficulty coming to stand without use of at least 1 UE. Pt confirms vertical gaze stabilization is more challenging than horizontal.    Pt reports she went to her ENT appointment yesterday and it didn't hurt but it was brutal with pt stating it made her dizzy. Pt states 1x she was clinging to the side of the bed. Pt states she is still waiting for the ENT to write up the report. Pt planning to have ENT report faxed or printed copy sent/brought to physical therapist.  Pt states the visual testing of motion sensitivity really caused her to be symptomatic (screen showing video being on train).    Pt denies any falls. Pt states she does feel like after her 1st PT visit things have goten better.   From Initial Eval: Pt states 2 weeks ago her PCP, Dr. Cleatus, referred her to cardiology and they did an EKG showing normal sinus rhythm. States she did wear heart monitor for 7 days with potential abnormal rhythm at night of tachycardia. Pt states cardiologist felt she had vertigo and referred her to ENT. Pt states a few years ago she was told she had loss of hearing. Pt states she has learned to lip  read really well since then while she saves up money to afford hearing aides. States she visited ENT and had debridement of her ears and they confirmed she needs hearing aides. Pt states ENT didn't see vertigo and referred her to vestibular rehab.   Pt states she has frequent falls and fell this morning when getting up from seated position with L LOB. States she often falls to her LEFT, but sometimes face forward. States when she stands up she has to stand for a little while because if she moves too quick everything is revolving/spinning and she gets nauseous.   Pt states last year or beginning of this year she had EMG and MRI, then was referred to PT for strength training. Pt states imaging found lumbar spine has bulging discs and that the physical therapist at the time discontinued care due to concern for exacerbating her lumbar spine symptoms. Pt states she did her exercises for a little while, but then gradually stopped doing them.  Pt states she uses rollator when her husband is not with her for security and to have seat in tow. Pt states she has sudden onset anxious feeling with no known provocation and then she starts pushing AD too far out in front of her causing instability.   Pt says she has tremors in R hand and she was assessed for Parkinson's due to having family hx on her dad's side. Pt states she saw Neurologist last year and they said she didn't have Parkinson's. Pt also reports having quivering in R LE.  Pt reports this morning she had some numbness down her R UE down to her middle finger, but it is gone now.  Pt states if she stands with both of her hands up over her head she will blackout and pass out because she was doing a task overhead years ago and this happened. States she can raise 1 arm overhead and she is OK.  Pt accompanied by: self, husband in waiting room [name])  Per ENT note on July 21st, 2025: Patient was referred to ENT by Arlyss Cleatus. Pt having vertigo  described as dizziness, imbalance, light-headedness, and nausea. Pt has associated nausea and vomiting. Vertigo has been present for 6 months. Typically having episodes daily. Vertigo developed suddenly. The vertigo is positional, occurring while looking up, standing up, lying flat, and upon getting up. Episodes typically last for seconds. The symptoms are made worse by movement. ENT performed bilateral cerumen debridement with microscopy on 12/08/2023. Hall pike was negative bilaterally by ENT. Planning for VNG testing and referred patient to vestibular rehab. Therapy ordered: Vestibular rehabilitation, Habituation exercises, and Substitution exercises. 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 (See chart for full details)  Per cardiology note on 11/17/2023: Patient has syncopal episodes ongoing over the past 1  to 2 years. She walks with a walker sometimes due to lower extremity weakness. Cardiac monitor placed last month showed occasional nonsustained SVT, no sustained arrhythmias. Echocardiogram 08/2023 showed normal EF. No significant structural abnormalities. Orthostatic vitals today with no evidence for orthostasis.    PERTINENT HISTORY: Arthritis, clotting disorder, GERD, IBS, L TKA, spastic bladder  PAIN:  Are you having pain? No  PRECAUTIONS: Fall  RED FLAGS: None   WEIGHT BEARING RESTRICTIONS: No  FALLS: Has patient fallen in last 6 months? Yes. Number of falls 10+ with pt having a fall this morning  LIVING ENVIRONMENT: Lives with: lives with their family and lives with their spouse Lives in: House/apartment Stairs: need to ask Has following equipment at home: Vannie - 4 wheeled, which pt uses if she doesn't have her husband's support  PLOF: Independent with basic ADLs, Independent with household mobility without device, Independent with community mobility without device, and Independent with homemaking with ambulation  PATIENT GOALS: improve dizziness,  decrease number of falls, improve balance  OBJECTIVE:  Note: Objective measures were completed at Evaluation unless otherwise noted.  DIAGNOSTIC FINDINGS: No brain imaging in chart EXAM: LUMBAR SPINE - COMPLETE 4+ VIEW COMPARISON:  MRI of lumbar spine December 06, 2022 FINDINGS: There is no evidence of lumbar spine fracture. Alignment is normal. Mild narrow intervertebral spaces and mild anterior spurring are identified in the upper lumbar spine. Bilateral hip replacements are noted. Minimal facet joint sclerosis are noted in the mid to lower lumbar spine.   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  Electronically Signed   By: Craig Farr M.D.   On: 09/18/2023 14:52  COGNITION: Overall cognitive status: Within functional limits for tasks assessed   SENSATION: Not tested  EDEMA:  Not formally measured, but has pitting edema in bilateral lower legs (pt reports being on fluid pill) Pt states she usually wears compression stockings 2-3x/week (not wearing today)  MUSCLE TONE:  Not formally assessed  DTRs:  Achilles: need to assess  POSTURE:  rounded shoulders, forward head, and posterior pelvic tilt  Cervical ROM:    Active Not formally measured, but observation of movement noted A/PROM (deg) eval  Flexion   Extension Decreased from normal   Right lateral flexion   Left lateral flexion   Right rotation   Left rotation Decreased compared to R  (Blank rows = not tested)  STRENGTH: Not formally assessed  LOWER EXTREMITY MMT:   MMT  Need to be assessed  Right  12/26/2023  Left  12/26/2023  Hip flexion 2- 2-  Hip abduction 4+ 4+  Hip adduction 4+ 4+  Hip internal rotation    Hip external rotation    Knee flexion 4+ 3+  Knee extension 4+ 4  Ankle dorsiflexion 4+ 4+  Ankle plantarflexion 4 4  Ankle inversion    Ankle eversion    (Blank rows = not tested)  *reports pain in R thigh/quad muscle and R anterior hip  Manual Muscle Test  Scale 0/5 = No muscle contraction can be seen or felt 1/5 = Contraction can be felt, but there is no motion 2-/5 = Part moves through incomplete ROM w/ gravity decreased 2/5 = Part moves through complete ROM w/ gravity decreased 2+/5 = Part moves through incomplete ROM (<50%) against gravity or through complete ROM w/ gravity 3-/5 = Part moves through incomplete ROM (>50%) against gravity 3/5 = Part moves through complete ROM against gravity 3+/5 = Part moves through complete ROM against gravity/slight resistance 4-/5=  Holds test position against slight to moderate pressure 4/5 = Part moves through complete ROM against gravity/moderate resistance 4+/5= Holds test position against moderate to strong pressure 5/5 = Part moves through complete ROM against gravity/full resistance  BED MOBILITY:  Sit to supine Modified independence Supine to sit Modified independence *Patient uses UEs to assist with LE management on/off the mat  TRANSFERS: Assistive device utilized: HHA  Sit to stand: CGA Stand to sit: CGA Chair to chair: CGA Floor: not assessed  RAMP: not assessed  CURB: not assessed  GAIT: Gait pattern: pt reports often feeling she veers towards the LEFT, decreased arm swing- Right, decreased arm swing- Left, decreased step length- Right, decreased step length- Left, and wide BOS Distance walked: ~129ft Assistive device utilized: L HHA vs none Level of assistance: CGA Comments:   FUNCTIONAL TESTS:   10 meter walk test: need to assess  Berg Balance Test: need to assess  Functional Gait Assessment: need to assess   PATIENT SURVEYS:   Dizziness Handicap Inventory [facility]): need to assess   VESTIBULAR ASSESSMENT:    SYMPTOM BEHAVIOR:  Subjective history: see above  Non-Vestibular symptoms: changes in hearing a few years ago, tinnitus, nausea but no vomiting, and hx of migraines years ago but never had to take medication for them  Type of dizziness: Spinning/Vertigo,  Lightheadedness/Faint, and spinning when she stands up, states she has significant increased fatigue  Frequency: change positions (lying to sitting, but mostly sit to stand)  Duration: <25minute  Aggravating factors: Induced by position change: supine to sit and sit to stand  Relieving factors: slow movements and trying to stand before starting to walk  Progression of symptoms: slight increase/worsening of symptoms since February  OCULOMOTOR EXAM:  Ocular Alignment: normal  Ocular ROM: No Limitations  Spontaneous Nystagmus: absent  Gaze-Induced Nystagmus: absent  Smooth Pursuits: impaired with some difficulty tracking, especially when attempting to perform more circular smooth pursuits with pt reporting this causes her to feel dizzy  Saccades: hypometric/undershoots, hypermetric/overshoots, and slow  Convergence/Divergence: unable to converge eyes   VESTIBULAR - OCULAR REFLEX:   Slow VOR: Normal  VOR Cancellation: Corrective Saccades in both directions   Head-Impulse Test: HIT Right: positive HIT Left: positive  Dynamic Visual Acuity: Not able to be assessed   POSITIONAL TESTING:  Loaded Right Dix-Hallpike: no nystagmus, but positive for symptoms with delayed onset around ~30seconds and pt reporting this is her dizziness Loaded Left Dix-Hallpike: no nystagmus and negative for symptoms  Performed Canalith Repositioning: 1x on R side   MOTION SENSITIVITY:  Motion Sensitivity Quotient Intensity: 0 = none, 1 = Lightheaded, 2 = Mild, 3 = Moderate, 4 = Severe, 5 = Vomiting  Intensity  1. Sitting to supine   2. Supine to L side   3. Supine to R side   4. Supine to sitting   5. L Hallpike-Dix   6. Up from L    7. R Hallpike-Dix   8. Up from R    9. Sitting, head tipped to L knee   10. Head up from L knee   11. Sitting, head tipped to R knee   12. Head up from R knee   13. Sitting head turns x5   14.Sitting head nods x5   15. In stance, 180 turn to L    16. In stance, 180  turn to R     OTHOSTATICS:  12/23/23:  116/64 68bpm supine (no symptoms)  118/61 67bpm seated (no symptoms)  116/65 71bpm standing (  no symptoms)  114/68 70bpm standing x 60 sec (no symptoms)  (Negative) *also negative with cardiology                                                                                                                            TREATMENT DATE: 12/26/2023  Decided to defer further vestibular testing at this time and pt planning to have her ENT testing results faxed over to therapist next week.  Assessed B LE strength - updated above. Educated pt on results of assessment and areas of noticeable weakness.  Sit<>stands from green chair with arms crossed x10reps  Cuing for increased anterior trunk lean to improve independence with coming upright Noticed biasing wt bearing through R LE Pt successful rising without UE support today!!  Standing at counter exercises as follows:  Marches x20reps Cuing to lift knees with hip flexion rather than just knee flexion Cuing to maintain upright without leaning forward onto counter  Standing balance interventions: NBOS head rotations X10 horizontal - this was more challenging X10 vertical   Updated HEP printout provided to include standing balance interventions.  Gait training interventions as follows:  2 hallway laps (~364ft) forward with head turning to visually locate post-it notes on walls  Progressed to turning to visually locate number of digits held up by therapist to promote increased head rotation with noticeable minor postural instability 1/2 lap (~47ft) backwards gait with head turning to visually locate post-it notes on walls Minor to mild postural sway requiring CGA and intermittent min A to maintain upright  No complaints of dizziness during session.   PATIENT EDUCATION: Education details: Findings of therapy assessment, vestibular testing, vestibular treatment, plan for upcoming sessions Person  educated: Patient Education method: Explanation and Handouts Education comprehension: verbalized understanding and needs further education  HOME EXERCISE PROGRAM:  Access Code: [email or web address] URL: [email or web address] Date: 12/26/2023 Prepared by: Connell Kiss  Exercises - Seated Gaze Stabilization with Head Rotation  - 3 x daily - 3 sets - 30sec hold - Seated Gaze Stabilization with Head Nod  - 3 x daily - 3 sets - 30sec hold - Sit to Stand with Arms Crossed  - 3 x daily - 1 sets - 10 reps - Standing March with Counter Support  - 1 x daily - 7 x weekly - 2 sets - 10-15 reps - Narrow Stance with Head Nods and Counter Support  - 1 x daily - 7 x weekly - 2 sets - 10 reps    GOALS: Goals reviewed with patient? Yes  SHORT TERM GOALS: Target date: 01/21/2024   Patient will be independent in home exercise program to improve gaze stabilization and/or mobility for improved functional independence with ADLs while experiencing decreased dizziness symptoms. Baseline: initiated on 12/23/2023 Goal status: INITIAL   LONG TERM GOALS: Target date: 03/03/2024  Patient will reduce dizziness handicap inventory [facility]) score to <36, for less dizziness with ADLs and increased safety with home and work tasks.  Baseline: 48%  Goal status: INITIAL  2.  Patient will deny any falls over past 4 weeks to demonstrate improved balance and safe independence with functional mobility.  Baseline: reports frequent falls with 1 on day of initial eval Goal status: INITIAL  3.  Patient will increase Berg Balance score to > 51/56 to demonstrate improved balance and decreased fall risk during functional activities and ADLs.  Baseline: need to assess Goal status: INITIAL  4.  Patient will increase 10 meter walk test to >1.33m/s using LRAD as to improve gait speed for better community ambulation and to reduce fall risk. Baseline: 0.15m/s  Goal status: INITIAL  5.  Patient will increase Functional Gait  Assessment (FGA) score to >20/30 as to reduce fall risk and improve dynamic gait safety with community ambulation.  Baseline: 15/30 Goal status: INITIAL  ASSESSMENT:  CLINICAL IMPRESSION: Patient is a 73 y.o. female who was seen today for physical therapy treatment for dizziness and imbalance with frequent falls. Patient reports her symptoms have improved following initial PT visit and she had VNG testing at ENT yesterday with plan to have results faxed or brought to therapist.  Therapy session focused on updating patient's HEP to include standing balance interventions and B LE functional weakness noted during assessment. Patient then participated in dynamic gait training with head turns as noted this was challenging for patient on the FGA. The pt will benefit from further skilled PT to improve positional and movement evoked dizziness and balance in order to return pt to PLOF an increase QOL.  OBJECTIVE IMPAIRMENTS: Abnormal gait, cardiopulmonary status limiting activity, decreased activity tolerance, decreased balance, decreased coordination, decreased endurance, decreased knowledge of condition, decreased knowledge of use of DME, decreased mobility, difficulty walking, decreased strength, and dizziness.   ACTIVITY LIMITATIONS: carrying, lifting, bending, standing, squatting, transfers, bed mobility, bathing, toileting, dressing, reach over head, hygiene/grooming, and locomotion level  PARTICIPATION LIMITATIONS: meal prep, cleaning, laundry, shopping, community activity, and yard work  PERSONAL FACTORS: Age, Time since onset of injury/illness/exacerbation, and 3+ comorbidities: Arthritis, clotting disorder, GERD, IBS, L TKA, spastic bladder are also affecting patient's functional outcome.   REHAB POTENTIAL: Good  CLINICAL DECISION MAKING: Evolving/moderate complexity  EVALUATION COMPLEXITY: Moderate   PLAN:  PT FREQUENCY: 1-2x/week  PT DURATION: 12 weeks  PLANNED INTERVENTIONS: [id number]-  PT Re-evaluation, 97750- Physical Performance Testing, 97110-Therapeutic exercises, 97530- Therapeutic activity, 97112- Neuromuscular re-education, 97535- Self Care, [id number]- Manual therapy, [phone]- Gait training, [phone]- Orthotic Initial, [phone]- Orthotic/Prosthetic subsequent, [phone]- Canalith repositioning, Patient/Family education, Balance training, Stair training, Taping, Joint mobilization, Spinal mobilization, Vestibular training, Visual/preceptual remediation/compensation, DME instructions, Cryotherapy, Moist heat, and Biofeedback  PLAN FOR UPCOMING SESSIONS:  - Berg Balance Test - follow-up on/progress gaze stabilization exercises  - X1 and VOR cancellation  - convergence exercise - educate on sign/symptoms of CVA - assess DTRs if indicated   Don Tiu, PT, DPT, NCS, CSRS Physical Therapist - [place]  [facility]  9:41 AM 12/26/23

## 2023-12-29 ENCOUNTER — Ambulatory Visit: Admitting: Physical Therapy

## 2023-12-29 ENCOUNTER — Other Ambulatory Visit: Payer: Self-pay | Admitting: Unknown Physician Specialty

## 2023-12-29 DIAGNOSIS — R2681 Unsteadiness on feet: Secondary | ICD-10-CM

## 2023-12-29 DIAGNOSIS — M6281 Muscle weakness (generalized): Secondary | ICD-10-CM

## 2023-12-29 DIAGNOSIS — R296 Repeated falls: Secondary | ICD-10-CM | POA: Diagnosis not present

## 2023-12-29 DIAGNOSIS — R42 Dizziness and giddiness: Secondary | ICD-10-CM

## 2023-12-29 DIAGNOSIS — R29898 Other symptoms and signs involving the musculoskeletal system: Secondary | ICD-10-CM | POA: Diagnosis not present

## 2023-12-29 NOTE — Therapy (Signed)
 OUTPATIENT PHYSICAL THERAPY VESTIBULAR TREATMENT     Patient Name: Jennifer Frank MRN: [id number] DOB:1951-01-07, 73 y.o., female Today's Date: 12/29/2023  END OF SESSION:   PT End of Session - 12/29/23 0847     Visit Number 4    Number of Visits 24    Date for PT Re-Evaluation 03/03/24    Authorization Type [employer] Medicare    Progress Note Due on Visit 10    PT Start Time 0847    PT Stop Time 0933    PT Time Calculation (min) 46 min    Equipment Utilized During Treatment Gait belt    Activity Tolerance Patient tolerated treatment well;No increased pain    Behavior During Therapy WFL for tasks assessed/performed           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   Fundic gland polyps of stomach, benign    GERD (gastroesophageal reflux disease)    past hx    Hearing loss    IBS (irritable bowel syndrome)    Personal history of colonic polyps - adenoma 09/22/2013   PONV (postoperative nausea and vomiting)    Reflux    Past Surgical History:  Procedure Laterality Date   HIP ARTHROSCOPY Left 1997   KNEE ARTHROSCOPY Left 1996   KNEE ARTHROSCOPY Right    05-2016 same time as left knee replacement    POLYPECTOMY     TONSILLECTOMY AND ADENOIDECTOMY  1957   TOTAL KNEE ARTHROPLASTY Bilateral 05/27/2016   Procedure: LEFT TOTAL KNEE ARTHROPLASTY WITH RIGHT KNEE ARTHROSCOPY;  Surgeon: Jennifer Sensor, MD;  Location: [facility] OR;  Service: Orthopedics;  Laterality: Bilateral;   UPPER GASTROINTESTINAL ENDOSCOPY     WISDOM TOOTH EXTRACTION     Patient Active Problem List   Diagnosis Date Noted   Syncope 09/14/2023   Other social stressor 07/09/2023   Insomnia 07/09/2023   Back pain 03/12/2023   Skin irritation 03/12/2023   Tremor 09/08/2022   Osteopenia 02/11/2022   SOB (shortness of breath) 05/27/2021   Aortic atherosclerosis (HCC) 03/12/2020   Medicare annual wellness visit, subsequent 01/10/2019    Advance care planning 12/14/2017   Urinary incontinence 12/14/2017   Primary osteoarthritis of left knee 05/26/2016   Health care maintenance 04/22/2016   Hearing loss 04/22/2014   Varicose veins of bilateral lower extremities with other complications 01/25/2013   Edema 01/25/2013   Hyperlipidemia 02/16/2007   GERD 02/16/2007    PCP: Jennifer Arlyss RAMAN, MD REFERRING PROVIDER: [name], MD   REFERRING DIAG: R42 (ICD-10-CM) - Dizziness   THERAPY DIAG:  Dizziness and giddiness  Unsteadiness on feet  Muscle weakness (generalized)  Repeated falls  Weakness of lower extremity, unspecified laterality  ONSET DATE: Around February 2025  Rationale for Evaluation and Treatment: Rehabilitation  SUBJECTIVE:   SUBJECTIVE STATEMENT:  Pt reports she had a little dizzy spell Saturday afternoon/evening after getting out of the car. States she doesn't normally have dizziness after riding in the car, but does report as a child she had to ride up front because she had motion sickness.   States when she went to lay down Saturday night she felt spinning. States she repositioning propped up on the couch to sleep because the symptoms were lasting ~11minutes while lying. States yesterday morning she felt rough due to not sleeping well the night before. Pt states yesterday afternoon she was able to do her exercises.  Pt states she did have some increased swelling in her knees after wearing the compression stockings that go up to her knees, but she was still successful with her exercise.  No reports of falls. Pt planning to follow-up with ENT before next PT appointment to get printout from VNG testing performed on 12/25/2023.   From Initial Eval: Pt states 2 weeks ago her PCP, Dr. Cleatus, referred her to cardiology and they did an EKG showing normal sinus rhythm. States she did wear heart monitor for 7 days with potential abnormal rhythm at night of tachycardia. Pt states cardiologist felt  she had vertigo and referred her to ENT. Pt states a few years ago she was told she had loss of hearing. Pt states she has learned to lip read really well since then while she saves up money to afford hearing aides. States she visited ENT and had debridement of her ears and they confirmed she needs hearing aides. Pt states ENT didn't see vertigo and referred her to vestibular rehab.   Pt states she has frequent falls and fell this morning when getting up from seated position with L LOB. States she often falls to her LEFT, but sometimes face forward. States when she stands up she has to stand for a little while because if she moves too quick everything is revolving/spinning and she gets nauseous.   Pt states last year or beginning of this year she had EMG and MRI, then was referred to PT for strength training. Pt states imaging found lumbar spine has bulging discs and that the physical therapist at the time discontinued care due to concern for exacerbating her lumbar spine symptoms. Pt states she did her exercises for a little while, but then gradually stopped doing them.  Pt states she uses rollator when her husband is not with her for security and to have seat in tow. Pt states she has sudden onset anxious feeling with no known provocation and then she starts pushing AD too far out in front of her causing instability.   Pt says she has tremors in R hand and she was assessed for Parkinson's due to having family hx on her dad's side. Pt states she saw Neurologist last year and they said she didn't have Parkinson's. Pt also reports having quivering in R LE.  Pt reports this morning she had some numbness down her R UE down to her middle finger, but it is gone now.  Pt states if she stands with both of her hands up over her head she will blackout and pass out because she was doing a task overhead years ago and this happened. States she can raise 1 arm overhead and she is OK.  Pt accompanied by: self,  husband in waiting room Jennifer Frank)  Per ENT note on July 21st, 2025: Patient was referred to ENT by Arlyss Jennifer. Pt having vertigo described as dizziness, imbalance, light-headedness, and nausea. Pt has associated nausea and vomiting. Vertigo has been present for 6 months. Typically having episodes daily. Vertigo developed suddenly. The vertigo is positional, occurring while looking up, standing up, lying flat, and upon getting up. Episodes typically last for seconds. The symptoms are made worse by movement. ENT performed bilateral cerumen debridement with microscopy on 12/08/2023. Hall pike was negative bilaterally by ENT. Planning for VNG testing and referred patient to vestibular rehab. Therapy ordered: Vestibular rehabilitation, Habituation exercises, and Substitution exercises. 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 (See chart for full details)  Per cardiology  note on 11/17/2023: Patient has syncopal episodes ongoing over the past 1 to 2 years. She walks with a walker sometimes due to lower extremity weakness. Cardiac monitor placed last month showed occasional nonsustained SVT, no sustained arrhythmias. Echocardiogram 08/2023 showed normal EF. No significant structural abnormalities. Orthostatic vitals today with no evidence for orthostasis.    PERTINENT HISTORY: Arthritis, clotting disorder, GERD, IBS, L TKA, spastic bladder  PAIN:  Are you having pain? No  PRECAUTIONS: Fall  RED FLAGS: None   WEIGHT BEARING RESTRICTIONS: No  FALLS: Has patient fallen in last 6 months? Yes. Number of falls 10+ with pt having a fall this morning  LIVING ENVIRONMENT: Lives with: lives with their family and lives with their spouse Lives in: House/apartment Stairs: need to ask Has following equipment at home: Vannie - 4 wheeled, which pt uses if she doesn't have her husband's support  PLOF: Independent with basic ADLs, Independent with household mobility without device,  Independent with community mobility without device, and Independent with homemaking with ambulation  PATIENT GOALS: improve dizziness, decrease number of falls, improve balance  OBJECTIVE:  Note: Objective measures were completed at Evaluation unless otherwise noted.  DIAGNOSTIC FINDINGS: No brain imaging in chart EXAM: LUMBAR SPINE - COMPLETE 4+ VIEW COMPARISON:  MRI of lumbar spine December 06, 2022 FINDINGS: There is no evidence of lumbar spine fracture. Alignment is normal. Mild narrow intervertebral spaces and mild anterior spurring are identified in the upper lumbar spine. Bilateral hip replacements are noted. Minimal facet joint sclerosis are noted in the mid to lower lumbar spine.   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  Electronically Signed   By: Craig Farr M.D.   On: 09/18/2023 14:52  COGNITION: Overall cognitive status: Within functional limits for tasks assessed   SENSATION: Not tested  EDEMA:  Not formally measured, but has pitting edema in bilateral lower legs (pt reports being on fluid pill) Pt states she usually wears compression stockings 2-3x/week (not wearing today)  MUSCLE TONE:  Not formally assessed  DTRs:  Achilles: need to assess  POSTURE:  rounded shoulders, forward head, and posterior pelvic tilt  Cervical ROM:    Active Not formally measured, but observation of movement noted A/PROM (deg) eval  Flexion   Extension Decreased from normal   Right lateral flexion   Left lateral flexion   Right rotation   Left rotation Decreased compared to R  (Blank rows = not tested)  STRENGTH: Not formally assessed  LOWER EXTREMITY MMT:   MMT  Need to be assessed  Right  12/26/2023  Left  12/26/2023  Hip flexion 2- 2-  Hip abduction 4+ 4+  Hip adduction 4+ 4+  Hip internal rotation    Hip external rotation    Knee flexion 4+ 3+  Knee extension 4+ 4  Ankle dorsiflexion 4+ 4+  Ankle plantarflexion 4 4  Ankle  inversion    Ankle eversion    (Blank rows = not tested)  *reports pain in R thigh/quad muscle and R anterior hip  Manual Muscle Test Scale 0/5 = No muscle contraction can be seen or felt 1/5 = Contraction can be felt, but there is no motion 2-/5 = Part moves through incomplete ROM w/ gravity decreased 2/5 = Part moves through complete ROM w/ gravity decreased 2+/5 = Part moves through incomplete ROM (<50%) against gravity or through complete ROM w/ gravity 3-/5 = Part moves through incomplete ROM (>50%) against gravity 3/5 = Part moves through complete ROM against  gravity 3+/5 = Part moves through complete ROM against gravity/slight resistance 4-/5= Holds test position against slight to moderate pressure 4/5 = Part moves through complete ROM against gravity/moderate resistance 4+/5= Holds test position against moderate to strong pressure 5/5 = Part moves through complete ROM against gravity/full resistance  BED MOBILITY:  Sit to supine Modified independence Supine to sit Modified independence *Patient uses UEs to assist with LE management on/off the mat  TRANSFERS: Assistive device utilized: HHA  Sit to stand: CGA Stand to sit: CGA Chair to chair: CGA Floor: not assessed  RAMP: not assessed  CURB: not assessed  GAIT: Gait pattern: pt reports often feeling she veers towards the LEFT, decreased arm swing- Right, decreased arm swing- Left, decreased step length- Right, decreased step length- Left, and wide BOS Distance walked: ~164ft Assistive device utilized: L HHA vs none Level of assistance: CGA Comments:   FUNCTIONAL TESTS:   10 meter walk test: need to assess  Berg Balance Test: need to assess  Functional Gait Assessment: need to assess   PATIENT SURVEYS:   Dizziness Handicap Inventory [facility]): need to assess   VESTIBULAR ASSESSMENT:    SYMPTOM BEHAVIOR:  Subjective history: see above  Non-Vestibular symptoms: changes in hearing a few years ago, tinnitus,  nausea but no vomiting, and hx of migraines years ago but never had to take medication for them  Type of dizziness: Spinning/Vertigo, Lightheadedness/Faint, and spinning when she stands up, states she has significant increased fatigue  Frequency: change positions (lying to sitting, but mostly sit to stand)  Duration: <57minute  Aggravating factors: Induced by position change: supine to sit and sit to stand  Relieving factors: slow movements and trying to stand before starting to walk  Progression of symptoms: slight increase/worsening of symptoms since February  OCULOMOTOR EXAM:  Ocular Alignment: normal  Ocular ROM: No Limitations  Spontaneous Nystagmus: absent  Gaze-Induced Nystagmus: absent  Smooth Pursuits: impaired with some difficulty tracking, especially when attempting to perform more circular smooth pursuits with pt reporting this causes her to feel dizzy  Saccades: hypometric/undershoots, hypermetric/overshoots, and slow  Convergence/Divergence: unable to converge eyes   VESTIBULAR - OCULAR REFLEX:   Slow VOR: Normal  VOR Cancellation: Corrective Saccades in both directions   Head-Impulse Test: HIT Right: positive HIT Left: positive  Dynamic Visual Acuity: Not able to be assessed   POSITIONAL TESTING:  Loaded Right Dix-Hallpike: no nystagmus, but positive for symptoms with delayed onset around ~30seconds and pt reporting this is her dizziness Loaded Left Dix-Hallpike: no nystagmus and negative for symptoms  Performed Canalith Repositioning: 1x on R side   MOTION SENSITIVITY:  Motion Sensitivity Quotient Intensity: 0 = none, 1 = Lightheaded, 2 = Mild, 3 = Moderate, 4 = Severe, 5 = Vomiting  Intensity  1. Sitting to supine   2. Supine to L side   3. Supine to R side   4. Supine to sitting   5. L Hallpike-Dix   6. Up from L    7. R Hallpike-Dix   8. Up from R    9. Sitting, head tipped to L knee   10. Head up from L knee   11. Sitting, head tipped to R knee   12.  Head up from R knee   13. Sitting head turns x5   14.Sitting head nods x5   15. In stance, 180 turn to L    16. In stance, 180 turn to R     OTHOSTATICS:  12/23/23:  116/64 68bpm supine (  no symptoms)  118/61 67bpm seated (no symptoms)  116/65 71bpm standing (no symptoms)  114/68 70bpm standing x 60 sec (no symptoms)  (Negative) *also negative with cardiology                                                                                                                            TREATMENT DATE: 12/29/2023  Repeated Vestibular Positional Testing based on: Pt reporting dizziness when lying supine that improved upon sitting upright Pt reporting improvement in her dizziness symptoms following CRM at initial eval   Repeat Positional Vestibular Testing:   R Loaded Dix-Hallpike: negative nystagmus & symptoms x2 L Loaded Dix-Hallpike: negative nystagmus & symptoms x2  R Roll Test: negative nystagmus & symptoms L Roll Test: negative nystagmus & symptoms   When she goes to sit up at the end of testing, pt reports rush of nausea but no real spinning.   Pt planning to follow-up with ENT today regarding getting access to her records.  Pt reports NBOS exercise prescribed at last visit is really challenging.  Seated VOR: Horizontal x10 reps + x20reps (not symptomatic on 2nd time) 1st set pt Nauseated with a little bit of spinning and jittery like, anticipation of something unusual  reported symptoms at 3/10 lasting <1 minute Vertical x10reps Symptoms 3/10 lasting <30seconds   Educated pt on proper technique of these exercises as pt has been performing them incorrectly at home. Educated pt to perform them at home with symptoms <5/10 and able to recover back to baseline within 20 minutes following exercises.    PATIENT EDUCATION: Education details: Findings of therapy assessment, vestibular testing, vestibular treatment, plan for upcoming sessions Person educated:  Patient Education method: Explanation and Handouts Education comprehension: verbalized understanding and needs further education  HOME EXERCISE PROGRAM:  Access Code: [email or web address] URL: [email or web address] Date: 12/26/2023 Prepared by: Connell Kiss  Exercises - Seated Gaze Stabilization with Head Rotation  - 3 x daily - 3 sets - 30sec hold - Seated Gaze Stabilization with Head Nod  - 3 x daily - 3 sets - 30sec hold - Sit to Stand with Arms Crossed  - 3 x daily - 1 sets - 10 reps - Standing March with Counter Support  - 1 x daily - 7 x weekly - 2 sets - 10-15 reps - Narrow Stance with Head Nods and Counter Support  - 1 x daily - 7 x weekly - 2 sets - 10 reps    GOALS: Goals reviewed with patient? Yes  SHORT TERM GOALS: Target date: 01/21/2024   Patient will be independent in home exercise program to improve gaze stabilization and/or mobility for improved functional independence with ADLs while experiencing decreased dizziness symptoms. Baseline: initiated on 12/23/2023 Goal status: INITIAL   LONG TERM GOALS: Target date: 03/03/2024  Patient will reduce dizziness handicap inventory [facility]) score to <36, for less dizziness with ADLs and increased safety with home and work tasks.  Baseline: 48% Goal status: INITIAL  2.  Patient will deny any falls over past 4 weeks to demonstrate improved balance and safe independence with functional mobility.  Baseline: reports frequent falls with 1 on day of initial eval Goal status: INITIAL  3.  Patient will increase Berg Balance score to > 51/56 to demonstrate improved balance and decreased fall risk during functional activities and ADLs.  Baseline: need to assess Goal status: INITIAL  4.  Patient will increase 10 meter walk test to >1.44m/s using LRAD as to improve gait speed for better community ambulation and to reduce fall risk. Baseline: 0.64m/s  Goal status: INITIAL  5.  Patient will increase Functional Gait Assessment (FGA)  score to >20/30 as to reduce fall risk and improve dynamic gait safety with community ambulation.  Baseline: 15/30 Goal status: INITIAL  ASSESSMENT:  CLINICAL IMPRESSION:  Patient is a 73 y.o. female who was seen today for physical therapy treatment for dizziness and imbalance with frequent falls. Patient reports an episode of dizziness when transitioning into supine since last session; therefore, started with repeat positional vestibular testing today; however, both R and L Dix-Hallpike and Roll Tests were negative. Therefore, transitioned focus to review seated gaze stabilization exercises as pt noted to be performing them incorrectly at home. Patient does report onset of dizziness symptoms with both horizontal and vertical VOR X1 exercises in sitting. Educated pt to continue performing gaze stabilization and standing balance exercises at home with plan to progress these in upcoming visits as well as trial optokinetic training videos if appropriate. The pt will benefit from further skilled PT to improve positional and movement evoked dizziness and balance in order to return pt to PLOF an increase QOL.  OBJECTIVE IMPAIRMENTS: Abnormal gait, cardiopulmonary status limiting activity, decreased activity tolerance, decreased balance, decreased coordination, decreased endurance, decreased knowledge of condition, decreased knowledge of use of DME, decreased mobility, difficulty walking, decreased strength, and dizziness.   ACTIVITY LIMITATIONS: carrying, lifting, bending, standing, squatting, transfers, bed mobility, bathing, toileting, dressing, reach over head, hygiene/grooming, and locomotion level  PARTICIPATION LIMITATIONS: meal prep, cleaning, laundry, shopping, community activity, and yard work  PERSONAL FACTORS: Age, Time since onset of injury/illness/exacerbation, and 3+ comorbidities: Arthritis, clotting disorder, GERD, IBS, L TKA, spastic bladder are also affecting patient's functional outcome.    REHAB POTENTIAL: Good  CLINICAL DECISION MAKING: Evolving/moderate complexity  EVALUATION COMPLEXITY: Moderate   PLAN:  PT FREQUENCY: 1-2x/week  PT DURATION: 12 weeks  PLANNED INTERVENTIONS: 97164- PT Re-evaluation, 97750- Physical Performance Testing, 97110-Therapeutic exercises, 97530- Therapeutic activity, 97112- Neuromuscular re-education, 97535- Self Care, [id number]- Manual therapy, [phone]- Gait training, [phone]- Orthotic Initial, [phone]- Orthotic/Prosthetic subsequent, [phone]- Canalith repositioning, Patient/Family education, Balance training, Stair training, Taping, Joint mobilization, Spinal mobilization, Vestibular training, Visual/preceptual remediation/compensation, DME instructions, Cryotherapy, Moist heat, and Biofeedback  PLAN FOR UPCOMING SESSIONS:  - Berg Balance Test - follow-up on/progress gaze stabilization exercises  - X1 and VOR cancellation  - convergence exercises - motion sensitivity quotient - optokinetic training videos  - educate on sign/symptoms of CVA - assess DTRs if indicated    Temesha Queener, PT, DPT, NCS, CSRS Physical Therapist - [place]  [facility]  9:37 AM 12/29/23

## 2024-01-01 ENCOUNTER — Ambulatory Visit: Admitting: Physical Therapy

## 2024-01-01 DIAGNOSIS — R42 Dizziness and giddiness: Secondary | ICD-10-CM

## 2024-01-01 DIAGNOSIS — M6281 Muscle weakness (generalized): Secondary | ICD-10-CM | POA: Diagnosis not present

## 2024-01-01 DIAGNOSIS — R29898 Other symptoms and signs involving the musculoskeletal system: Secondary | ICD-10-CM | POA: Diagnosis not present

## 2024-01-01 DIAGNOSIS — R296 Repeated falls: Secondary | ICD-10-CM | POA: Diagnosis not present

## 2024-01-01 DIAGNOSIS — R2681 Unsteadiness on feet: Secondary | ICD-10-CM

## 2024-01-01 NOTE — Therapy (Signed)
 OUTPATIENT PHYSICAL THERAPY VESTIBULAR TREATMENT     Patient Name: Jennifer Frank MRN: [id number] DOB:[birth year], 73 y.o., female Today's Date: 01/01/2024  END OF SESSION:   PT End of Session - 01/01/24 1024     Visit Number 5    Number of Visits 24    Date for PT Re-Evaluation 03/03/24    Authorization Type [employer] Medicare    Progress Note Due on Visit 10    PT Start Time 1024    PT Stop Time 1105    PT Time Calculation (min) 41 min    Equipment Utilized During Treatment Gait belt    Activity Tolerance Patient tolerated treatment well;No increased pain    Behavior During Therapy WFL for tasks assessed/performed            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   Fundic gland polyps of stomach, benign    GERD (gastroesophageal reflux disease)    past hx    Hearing loss    IBS (irritable bowel syndrome)    Personal history of colonic polyps - adenoma 09/22/2013   PONV (postoperative nausea and vomiting)    Reflux    Past Surgical History:  Procedure Laterality Date   HIP ARTHROSCOPY Left 1997   KNEE ARTHROSCOPY Left 1996   KNEE ARTHROSCOPY Right    05-2016 same time as left knee replacement    POLYPECTOMY     TONSILLECTOMY AND ADENOIDECTOMY  1957   TOTAL KNEE ARTHROPLASTY Bilateral 05/27/2016   Procedure: LEFT TOTAL KNEE ARTHROPLASTY WITH RIGHT KNEE ARTHROSCOPY;  Surgeon: Dempsey Sensor, MD;  Location: [facility] OR;  Service: Orthopedics;  Laterality: Bilateral;   UPPER GASTROINTESTINAL ENDOSCOPY     WISDOM TOOTH EXTRACTION     Patient Active Problem List   Diagnosis Date Noted   Syncope 09/14/2023   Other social stressor 07/09/2023   Insomnia 07/09/2023   Back pain 03/12/2023   Skin irritation 03/12/2023   Tremor 09/08/2022   Osteopenia 02/11/2022   SOB (shortness of breath) 05/27/2021   Aortic atherosclerosis (HCC) 03/12/2020   Medicare annual wellness visit, subsequent  01/10/2019   Advance care planning 12/14/2017   Urinary incontinence 12/14/2017   Primary osteoarthritis of left knee 05/26/2016   Health care maintenance 04/22/2016   Hearing loss 04/22/2014   Varicose veins of bilateral lower extremities with other complications 01/25/2013   Edema 01/25/2013   Hyperlipidemia 02/16/2007   GERD 02/16/2007    PCP: [name], MD REFERRING PROVIDER: [name], MD   REFERRING DIAG: R42 (ICD-10-CM) - Dizziness   THERAPY DIAG:  Dizziness and giddiness  Unsteadiness on feet  Muscle weakness (generalized)  Repeated falls  Weakness of lower extremity, unspecified laterality  ONSET DATE: Around February 2025  Rationale for Evaluation and Treatment: Rehabilitation  SUBJECTIVE:   SUBJECTIVE STATEMENT:  Patient brought in official VNG test results for therapist (results updated below).  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 Pt states she was referred to get brain imaging with and without contrast, but she has had difficulty following up with the imaging department to ensure they had the imaging referral from MD.  Pt reports she has not had any falls. However, states on Tuesday she went to use the bathroom and after she stood up and started walking she started spinning and had to hold onto  a chair to stabilize herself. States the spinning symptoms resolved within a few minutes, but then she felt fuzzy; however, after a few minutes the symptoms were completley resolved.   From Initial Eval: Pt states 2 weeks ago her PCP, Dr. Cleatus, referred her to cardiology and they did an EKG showing normal sinus rhythm. States she did wear heart monitor for 7 days with potential abnormal rhythm at night of tachycardia. Pt states cardiologist felt she had vertigo and referred her to ENT. Pt states a few years ago she was told she had loss of hearing. Pt states she has learned to lip read  really well since then while she saves up money to afford hearing aides. States she visited ENT and had debridement of her ears and they confirmed she needs hearing aides. Pt states ENT didn't see vertigo and referred her to vestibular rehab.   Pt states she has frequent falls and fell this morning when getting up from seated position with L LOB. States she often falls to her LEFT, but sometimes face forward. States when she stands up she has to stand for a little while because if she moves too quick everything is revolving/spinning and she gets nauseous.   Pt states last year or beginning of this year she had EMG and MRI, then was referred to PT for strength training. Pt states imaging found lumbar spine has bulging discs and that the physical therapist at the time discontinued care due to concern for exacerbating her lumbar spine symptoms. Pt states she did her exercises for a little while, but then gradually stopped doing them.  Pt states she uses rollator when her husband is not with her for security and to have seat in tow. Pt states she has sudden onset anxious feeling with no known provocation and then she starts pushing AD too far out in front of her causing instability.   Pt says she has tremors in R hand and she was assessed for Parkinson's due to having family hx on her dad's side. Pt states she saw Neurologist last year and they said she didn't have Parkinson's. Pt also reports having quivering in R LE.  Pt reports this morning she had some numbness down her R UE down to her middle finger, but it is gone now.  Pt states if she stands with both of her hands up over her head she will blackout and pass out because she was doing a task overhead years ago and this happened. States she can raise 1 arm overhead and she is OK.  Pt accompanied by: self, husband in waiting room [name])  Per ENT note on July 21st, 2025: Patient was referred to ENT by Arlyss Cleatus. Pt having vertigo described  as dizziness, imbalance, light-headedness, and nausea. Pt has associated nausea and vomiting. Vertigo has been present for 6 months. Typically having episodes daily. Vertigo developed suddenly. The vertigo is positional, occurring while looking up, standing up, lying flat, and upon getting up. Episodes typically last for seconds. The symptoms are made worse by movement. ENT performed bilateral cerumen debridement with microscopy on 12/08/2023. Hall pike was negative bilaterally by ENT. Planning for VNG testing and referred patient to vestibular rehab. Therapy ordered: Vestibular rehabilitation, Habituation exercises, and Substitution exercises. 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 (See chart for full details)  Per cardiology note on 11/17/2023: Patient has syncopal episodes ongoing over the past 1 to 2 years. She walks with a walker sometimes due to lower extremity  weakness. Cardiac monitor placed last month showed occasional nonsustained SVT, no sustained arrhythmias. Echocardiogram 08/2023 showed normal EF. No significant structural abnormalities. Orthostatic vitals today with no evidence for orthostasis.    PERTINENT HISTORY: Arthritis, clotting disorder, GERD, IBS, L TKA, spastic bladder  Updated notes from ENT visit on 12/25/2023: RESULTS OF VNG: No nystagmus or subjective dizziness was observed during [facility]. No spontaneous nystagmus was observed. Smooth pursuit was abnormal during the left cycles only. Saccades were normal. Optokinetic testing was abnormal in the faster speed to the right side in the left eye only. There was post-shake nystagmus, with four beats/second to the left side. Positional testing was abnormal, with eight beats/second of ageotropic, fatiguing, fixating nystagmus in body left and body right recordings. Calorics were abnormal, revealing a 38% left unilateral weakness.   IMPRESSION: Abnormal VNG. A central pathology cannot be ruled out based on  today's abnormal test findings: abnormal smooth pursuit and OPKs. Post-headshake nystagmus is consistent with left weakness. Ageotropic, fatiguing nystagmus that fixates is consistent with peripheral pathology, however, was not present in repeat testing with VNG goggles off and eyes open. There was no indication of BPPV. 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    PAIN:  Are you having pain? No  PRECAUTIONS: Fall  RED FLAGS: None   WEIGHT BEARING RESTRICTIONS: No  FALLS: Has patient fallen in last 6 months? Yes. Number of falls 10+ with pt having a fall this morning  LIVING ENVIRONMENT: Lives with: lives with their family and lives with their spouse Lives in: House/apartment Stairs: need to ask Has following equipment at home: Vannie - 4 wheeled, which pt uses if she doesn't have her husband's support  PLOF: Independent with basic ADLs, Independent with household mobility without device, Independent with community mobility without device, and Independent with homemaking with ambulation  PATIENT GOALS: improve dizziness, decrease number of falls, improve balance  OBJECTIVE:  Note: Objective measures were completed at Evaluation unless otherwise noted.  DIAGNOSTIC FINDINGS: No brain imaging in chart EXAM: LUMBAR SPINE - COMPLETE 4+ VIEW COMPARISON:  MRI of lumbar spine December 06, 2022 FINDINGS: There is no evidence of lumbar spine fracture. Alignment is normal. Mild narrow intervertebral spaces and mild anterior spurring are identified in the upper lumbar spine. Bilateral hip replacements are noted. Minimal facet joint sclerosis are noted in the mid to lower lumbar spine.   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  Electronically Signed   By: Craig Farr M.D.   On: 09/18/2023 14:52  COGNITION: Overall cognitive status: Within functional limits for  tasks assessed   SENSATION: Not tested  EDEMA:  Not formally measured, but has pitting edema in bilateral lower legs (pt reports being on fluid pill) Pt states she usually wears compression stockings 2-3x/week (not wearing today)  MUSCLE TONE:  Not formally assessed  DTRs:  Achilles: need to assess  POSTURE:  rounded shoulders, forward head, and posterior pelvic tilt  Cervical ROM:    Active Not formally measured, but observation of movement noted A/PROM (deg) eval  Flexion   Extension Decreased from normal   Right lateral flexion   Left lateral flexion   Right rotation   Left rotation Decreased compared to R  (Blank rows = not tested)  STRENGTH: Not formally assessed  LOWER EXTREMITY MMT:   MMT  Need to be assessed  Right  12/26/2023  Left  12/26/2023  Hip flexion 2- 2-  Hip abduction 4+ 4+  Hip adduction 4+ 4+  Hip internal rotation    Hip external rotation    Knee flexion 4+ 3+  Knee extension 4+ 4  Ankle dorsiflexion 4+ 4+  Ankle plantarflexion 4 4  Ankle inversion    Ankle eversion    (Blank rows = not tested)  *reports pain in R thigh/quad muscle and R anterior hip  Manual Muscle Test Scale 0/5 = No muscle contraction can be seen or felt 1/5 = Contraction can be felt, but there is no motion 2-/5 = Part moves through incomplete ROM w/ gravity decreased 2/5 = Part moves through complete ROM w/ gravity decreased 2+/5 = Part moves through incomplete ROM (<50%) against gravity or through complete ROM w/ gravity 3-/5 = Part moves through incomplete ROM (>50%) against gravity 3/5 = Part moves through complete ROM against gravity 3+/5 = Part moves through complete ROM against gravity/slight resistance 4-/5= Holds test position against slight to moderate pressure 4/5 = Part moves through complete ROM against gravity/moderate resistance 4+/5= Holds test position against moderate to strong pressure 5/5 = Part moves through complete ROM against  gravity/full resistance  BED MOBILITY:  Sit to supine Modified independence Supine to sit Modified independence *Patient uses UEs to assist with LE management on/off the mat  TRANSFERS: Assistive device utilized: HHA  Sit to stand: CGA Stand to sit: CGA Chair to chair: CGA Floor: not assessed  RAMP: not assessed  CURB: not assessed  GAIT: Gait pattern: pt reports often feeling she veers towards the LEFT, decreased arm swing- Right, decreased arm swing- Left, decreased step length- Right, decreased step length- Left, and wide BOS Distance walked: ~127ft Assistive device utilized: L HHA vs none Level of assistance: CGA Comments:   FUNCTIONAL TESTS:   10 meter walk test: need to assess  Berg Balance Test: need to assess  Functional Gait Assessment: need to assess   PATIENT SURVEYS:   Dizziness Handicap Inventory [facility]): need to assess   VESTIBULAR ASSESSMENT:    SYMPTOM BEHAVIOR:  Subjective history: see above  Non-Vestibular symptoms: changes in hearing a few years ago, tinnitus, nausea but no vomiting, and hx of migraines years ago but never had to take medication for them  Type of dizziness: Spinning/Vertigo, Lightheadedness/Faint, and spinning when she stands up, states she has significant increased fatigue  Frequency: change positions (lying to sitting, but mostly sit to stand)  Duration: <13minute  Aggravating factors: Induced by position change: supine to sit and sit to stand  Relieving factors: slow movements and trying to stand before starting to walk  Progression of symptoms: slight increase/worsening of symptoms since February  OCULOMOTOR EXAM:  Ocular Alignment: normal  Ocular ROM: No Limitations  Spontaneous Nystagmus: absent  Gaze-Induced Nystagmus: absent  Smooth Pursuits: impaired with some difficulty tracking, especially when attempting to perform more circular smooth pursuits with pt reporting this causes her to feel dizzy  Saccades:  hypometric/undershoots, hypermetric/overshoots, and slow  Convergence/Divergence: unable to converge eyes   VESTIBULAR - OCULAR REFLEX:   Slow VOR: Normal  VOR Cancellation: Corrective Saccades in both directions   Head-Impulse Test: HIT Right: positive HIT Left: positive  Dynamic Visual Acuity: Not able to be assessed   POSITIONAL TESTING:  Loaded Right Dix-Hallpike: no nystagmus, but positive for symptoms with delayed onset around ~30seconds and pt reporting this is her dizziness Loaded Left Dix-Hallpike: no nystagmus and negative for symptoms  Performed Canalith Repositioning: 1x on R side   MOTION SENSITIVITY:  Motion Sensitivity  Quotient Intensity: 0 = none, 1 = Lightheaded, 2 = Mild, 3 = Moderate, 4 = Severe, 5 = Vomiting  Intensity  1. Sitting to supine   2. Supine to L side   3. Supine to R side   4. Supine to sitting   5. L Hallpike-Dix   6. Up from L    7. R Hallpike-Dix   8. Up from R    9. Sitting, head tipped to L knee   10. Head up from L knee   11. Sitting, head tipped to R knee   12. Head up from R knee   13. Sitting head turns x5   14.Sitting head nods x5   15. In stance, 180 turn to L    16. In stance, 180 turn to R     OTHOSTATICS:  12/23/23:  116/64 68bpm supine (no symptoms)  118/61 67bpm seated (no symptoms)  116/65 71bpm standing (no symptoms)  114/68 70bpm standing x 60 sec (no symptoms)  (Negative) *also negative with cardiology                                                                                                                            TREATMENT DATE: 01/01/2024  Repeated Vestibular Positional Testing based on: Pt reporting acute, brief episode of spinning on Tuesday this week that occurred during positional change and motion of transitioning from sitting>standing>walking  VOR Testing:  Head-Impulse Test: RIGHT: positive and LEFT: positive for corrective saccades   Repeat Positional Vestibular Testing:   R Roll Test:  negative nystagmus & symptoms x2 L Roll Test: negative nystagmus & symptoms x2  R Dix-Hallpike: negative nystagmus & symptoms x2 L Dix-Hallpike: negative nystagmus & symptoms x2  But following positional testing patient states I don't think I could stand up and walk.   Due to continued negative positional testing, continued to address patient's VNG findings of left peripheral vestibular hypofunction   Reviewed patient's seated VOR X1 exercise: Horizontal x10 rep No symptoms reported today Did notice additional eye shifts during (primarily in L eye) Vertical x10reps no symptoms reported today) Did notice additional eye shifts during (primarily in L eye) *Educated pt on using closer target  *Pt continues to benefit from review of these to ensure she is doing them with proper form as pt initially performing them incorrectly *Educated pt on continuing to perform them in a seated position with a plain/non-busy background, but with the plan to progress them to a busy background and to doing them in standing at upcoming visits  Patient also educated on plan to address the abnormal Optokinetic findings on her VNG testing.     PATIENT EDUCATION: Education details: Findings of therapy assessment, vestibular testing, vestibular treatment, plan for upcoming sessions Person educated: Patient Education method: Explanation and Handouts Education comprehension: verbalized understanding and needs further education  HOME EXERCISE PROGRAM:  Access Code: [email or web address] URL: [email or web address] Date: 12/26/2023 Prepared by: Connell Kiss  Exercises - Seated Gaze  Stabilization with Head Rotation  - 3 x daily - 3 sets - 30sec hold - Seated Gaze Stabilization with Head Nod  - 3 x daily - 3 sets - 30sec hold - Sit to Stand with Arms Crossed  - 3 x daily - 1 sets - 10 reps - Standing March with Counter Support  - 1 x daily - 7 x weekly - 2 sets - 10-15 reps - Narrow Stance with Head Nods  and Counter Support  - 1 x daily - 7 x weekly - 2 sets - 10 reps    GOALS: Goals reviewed with patient? Yes  SHORT TERM GOALS: Target date: 01/21/2024   Patient will be independent in home exercise program to improve gaze stabilization and/or mobility for improved functional independence with ADLs while experiencing decreased dizziness symptoms. Baseline: initiated on 12/23/2023 Goal status: INITIAL   LONG TERM GOALS: Target date: 03/03/2024  Patient will reduce dizziness handicap inventory [facility]) score to <36, for less dizziness with ADLs and increased safety with home and work tasks.  Baseline: 48% Goal status: INITIAL  2.  Patient will deny any falls over past 4 weeks to demonstrate improved balance and safe independence with functional mobility.  Baseline: reports frequent falls with 1 on day of initial eval Goal status: INITIAL  3.  Patient will increase Berg Balance score to > 51/56 to demonstrate improved balance and decreased fall risk during functional activities and ADLs.  Baseline: need to assess Goal status: INITIAL  4.  Patient will increase 10 meter walk test to >1.68m/s using LRAD as to improve gait speed for better community ambulation and to reduce fall risk. Baseline: 0.81m/s  Goal status: INITIAL  5.  Patient will increase Functional Gait Assessment (FGA) score to >20/30 as to reduce fall risk and improve dynamic gait safety with community ambulation.  Baseline: 15/30 Goal status: INITIAL  ASSESSMENT:  CLINICAL IMPRESSION:  Patient is a 74 y.o. female who was seen today for physical therapy treatment for dizziness and imbalance with frequent falls. Patient reports one episode of spinning dizziness lasting only a few minutes when transitioning from sitting>standing>walking; therefore, started with repeat positional vestibular testing today; however, both R and L Dix-Hallpike and Roll Tests were negative. Therefore, transitioned focus to review seated gaze  stabilization exercises as pt noted to be performing them incorrectly at home again, but with min cuing is able to correct her technique. Patient does not report dizziness symptoms during VOR X1 exercises in sitting today. Therapist educated pt on recommendation to perform a closer target VOR as this has been shown to be beneficial in treating unilateral hypofunction. Educated pt to continue performing gaze stabilization and standing balance exercises at home as prescribed with plan to progress these in upcoming visits as well as trial optokinetic training videos now that official VNG test results have been obtained. The pt will benefit from further skilled PT to improve positional and movement evoked dizziness and balance in order to return pt to PLOF an increase QOL.    OBJECTIVE IMPAIRMENTS: Abnormal gait, cardiopulmonary status limiting activity, decreased activity tolerance, decreased balance, decreased coordination, decreased endurance, decreased knowledge of condition, decreased knowledge of use of DME, decreased mobility, difficulty walking, decreased strength, and dizziness.   ACTIVITY LIMITATIONS: carrying, lifting, bending, standing, squatting, transfers, bed mobility, bathing, toileting, dressing, reach over head, hygiene/grooming, and locomotion level  PARTICIPATION LIMITATIONS: meal prep, cleaning, laundry, shopping, community activity, and yard work  PERSONAL FACTORS: Age, Time since onset of injury/illness/exacerbation, and 3+  comorbidities: Arthritis, clotting disorder, GERD, IBS, L TKA, spastic bladder are also affecting patient's functional outcome.   REHAB POTENTIAL: Good  CLINICAL DECISION MAKING: Evolving/moderate complexity  EVALUATION COMPLEXITY: Moderate   PLAN:  PT FREQUENCY: 1-2x/week  PT DURATION: 12 weeks  PLANNED INTERVENTIONS: 97164- PT Re-evaluation, 97750- Physical Performance Testing, 97110-Therapeutic exercises, 97530- Therapeutic activity, 97112-  Neuromuscular re-education, 97535- Self Care, [id number]- Manual therapy, [phone]- Gait training, [phone]- Orthotic Initial, [phone]- Orthotic/Prosthetic subsequent, [phone]- Canalith repositioning, Patient/Family education, Balance training, Stair training, Taping, Joint mobilization, Spinal mobilization, Vestibular training, Visual/preceptual remediation/compensation, DME instructions, Cryotherapy, Moist heat, and Biofeedback  PLAN FOR UPCOMING SESSIONS:  - Berg Balance Test - follow-up on/progress gaze stabilization exercises  - X1 and VOR cancellation    - progress to busy background and/or in standing  - convergence exercises - motion sensitivity quotient - optokinetic training videos  - educate on sign/symptoms of CVA - assess DTRs if indicated    Jahnyla Parrillo, PT, DPT, NCS, CSRS Physical Therapist - [place]  [facility]  8:56 PM 01/01/24

## 2024-01-06 ENCOUNTER — Ambulatory Visit: Admitting: Physical Therapy

## 2024-01-06 ENCOUNTER — Telehealth: Payer: Self-pay | Admitting: Physical Therapy

## 2024-01-06 ENCOUNTER — Ambulatory Visit
Admission: RE | Admit: 2024-01-06 | Discharge: 2024-01-06 | Disposition: A | Discharge status: Home / Self Care | Source: Ambulatory Visit | Attending: Unknown Physician Specialty | Admitting: Unknown Physician Specialty

## 2024-01-06 DIAGNOSIS — R29898 Other symptoms and signs involving the musculoskeletal system: Secondary | ICD-10-CM | POA: Diagnosis not present

## 2024-01-06 DIAGNOSIS — R296 Repeated falls: Secondary | ICD-10-CM | POA: Diagnosis not present

## 2024-01-06 DIAGNOSIS — R2681 Unsteadiness on feet: Secondary | ICD-10-CM

## 2024-01-06 DIAGNOSIS — R42 Dizziness and giddiness: Secondary | ICD-10-CM

## 2024-01-06 DIAGNOSIS — M6281 Muscle weakness (generalized): Secondary | ICD-10-CM | POA: Diagnosis not present

## 2024-01-06 MED ORDER — GADOPICLENOL 0.5 MMOL/ML IV SOLN
10.0000 mL | Freq: Once | INTRAVENOUS | Status: AC | PRN
  Administered 2024-01-06: 10 mL via INTRAVENOUS

## 2024-01-06 NOTE — Therapy (Signed)
 OUTPATIENT PHYSICAL THERAPY VESTIBULAR TREATMENT     Patient Name: Jennifer Frank MRN: [id number] DOB:03-15-1951, 73 y.o., female Today's Date: 01/06/2024  END OF SESSION:   PT End of Session - 01/06/24 1119     Visit Number 6    Number of Visits 24    Date for PT Re-Evaluation 03/03/24    Authorization Type [employer] Medicare    Progress Note Due on Visit 10    PT Start Time 1117    PT Stop Time 1152    PT Time Calculation (min) 35 min    Equipment Utilized During Treatment Gait belt    Activity Tolerance Patient tolerated treatment well;No increased pain    Behavior During Therapy WFL for tasks assessed/performed             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   Fundic gland polyps of stomach, benign    GERD (gastroesophageal reflux disease)    past hx    Hearing loss    IBS (irritable bowel syndrome)    Personal history of colonic polyps - adenoma 09/22/2013   PONV (postoperative nausea and vomiting)    Reflux    Past Surgical History:  Procedure Laterality Date   HIP ARTHROSCOPY Left 1997   KNEE ARTHROSCOPY Left 1996   KNEE ARTHROSCOPY Right    05-2016 same time as left knee replacement    POLYPECTOMY     TONSILLECTOMY AND ADENOIDECTOMY  1957   TOTAL KNEE ARTHROPLASTY Bilateral 05/27/2016   Procedure: LEFT TOTAL KNEE ARTHROPLASTY WITH RIGHT KNEE ARTHROSCOPY;  Surgeon: Dempsey Sensor, MD;  Location: [facility] OR;  Service: Orthopedics;  Laterality: Bilateral;   UPPER GASTROINTESTINAL ENDOSCOPY     WISDOM TOOTH EXTRACTION     Patient Active Problem List   Diagnosis Date Noted   Syncope 09/14/2023   Other social stressor 07/09/2023   Insomnia 07/09/2023   Back pain 03/12/2023   Skin irritation 03/12/2023   Tremor 09/08/2022   Osteopenia 02/11/2022   SOB (shortness of breath) 05/27/2021   Aortic atherosclerosis (HCC) 03/12/2020   Medicare annual wellness visit, subsequent  01/10/2019   Advance care planning 12/14/2017   Urinary incontinence 12/14/2017   Primary osteoarthritis of left knee 05/26/2016   Health care maintenance 04/22/2016   Hearing loss 04/22/2014   Varicose veins of bilateral lower extremities with other complications 01/25/2013   Edema 01/25/2013   Hyperlipidemia 02/16/2007   GERD 02/16/2007    PCP: [name], MD REFERRING PROVIDER: [name], MD   REFERRING DIAG: R42 (ICD-10-CM) - Dizziness   THERAPY DIAG:  Dizziness and giddiness  Unsteadiness on feet  Muscle weakness (generalized)  Repeated falls  Weakness of lower extremity, unspecified laterality  ONSET DATE: Around February 2025  Rationale for Evaluation and Treatment: Rehabilitation  SUBJECTIVE:   SUBJECTIVE STATEMENT:  Patient apologizes for late arrival to session, reporting she thought it was at a later time. Pt agreeable to shortened therapy session today. Pt reports she has her MRI scheduled this afternoon. Pt reports her symptoms have been give and take. Pt states I'm not falling, but I'm still having the dizziness sometimes. Pt states she was lying on the couch on her back and when she rolled towards the R to get up she had onset of dizziness and it felt like she was moving which pt reports has never happened before. Pt states the symptoms only lasted  for < 1 minute.  Pt states she does feel much better stating she is close to 100% better.   Pt reports she also would like to continue focusing on B LE functional strength once vestibular symptoms are improved.   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    From Initial Eval: Pt states 2 weeks ago her PCP, Dr. Cleatus, referred her to cardiology and they did an EKG showing normal sinus rhythm. States she did wear heart monitor for 7 days with potential abnormal rhythm at night of  tachycardia. Pt states cardiologist felt she had vertigo and referred her to ENT. Pt states a few years ago she was told she had loss of hearing. Pt states she has learned to lip read really well since then while she saves up money to afford hearing aides. States she visited ENT and had debridement of her ears and they confirmed she needs hearing aides. Pt states ENT didn't see vertigo and referred her to vestibular rehab.   Pt states she has frequent falls and fell this morning when getting up from seated position with L LOB. States she often falls to her LEFT, but sometimes face forward. States when she stands up she has to stand for a little while because if she moves too quick everything is revolving/spinning and she gets nauseous.   Pt states last year or beginning of this year she had EMG and MRI, then was referred to PT for strength training. Pt states imaging found lumbar spine has bulging discs and that the physical therapist at the time discontinued care due to concern for exacerbating her lumbar spine symptoms. Pt states she did her exercises for a little while, but then gradually stopped doing them.  Pt states she uses rollator when her husband is not with her for security and to have seat in tow. Pt states she has sudden onset anxious feeling with no known provocation and then she starts pushing AD too far out in front of her causing instability.   Pt says she has tremors in R hand and she was assessed for Parkinson's due to having family hx on her dad's side. Pt states she saw Neurologist last year and they said she didn't have Parkinson's. Pt also reports having quivering in R LE.  Pt reports this morning she had some numbness down her R UE down to her middle finger, but it is gone now.  Pt states if she stands with both of her hands up over her head she will blackout and pass out because she was doing a task overhead years ago and this happened. States she can raise 1 arm overhead and  she is OK.  Pt accompanied by: self, husband in waiting room [name])  Per ENT note on July 21st, 2025: Patient was referred to ENT by Arlyss Cleatus. Pt having vertigo described as dizziness, imbalance, light-headedness, and nausea. Pt has associated nausea and vomiting. Vertigo has been present for 6 months. Typically having episodes daily. Vertigo developed suddenly. The vertigo is positional, occurring while looking up, standing up, lying flat, and upon getting up. Episodes typically last for seconds. The symptoms are made worse by movement. ENT performed bilateral cerumen debridement with microscopy on 12/08/2023. Hall pike was negative bilaterally by ENT. Planning for VNG testing and referred patient to vestibular rehab. Therapy ordered: Vestibular rehabilitation, Habituation exercises, and Substitution exercises. ENT also found  patient with bilateral hearing loss, sensorineural. (See chart for full details)  Per cardiology note on 11/17/2023: Patient has syncopal episodes ongoing over the past 1 to 2 years. She walks with a walker sometimes due to lower extremity weakness. Cardiac monitor placed last month showed occasional nonsustained SVT, no sustained arrhythmias. Echocardiogram 08/2023 showed normal EF. No significant structural abnormalities. Orthostatic vitals today with no evidence for orthostasis.    PERTINENT HISTORY: Arthritis, clotting disorder, GERD, IBS, L TKA, spastic bladder  Updated notes from ENT visit on 12/25/2023: RESULTS OF VNG: No nystagmus or subjective dizziness was observed during [facility]. No spontaneous nystagmus was observed. Smooth pursuit was abnormal during the left cycles only. Saccades were normal. Optokinetic testing was abnormal in the faster speed to the right side in the left eye only. There was post-shake nystagmus, with four beats/second to the left side. Positional testing was abnormal, with eight beats/second of ageotropic, fatiguing, fixating nystagmus in  body left and body right recordings. Calorics were abnormal, revealing a 38% left unilateral weakness.   IMPRESSION: Abnormal VNG. A central pathology cannot be ruled out based on today's abnormal test findings: abnormal smooth pursuit and OPKs. Post-headshake nystagmus is consistent with left weakness. Ageotropic, fatiguing nystagmus that fixates is consistent with peripheral pathology, however, was not present in repeat testing with VNG goggles off and eyes open. There was no indication of BPPV. 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    PAIN:  Are you having pain? No  PRECAUTIONS: Fall  RED FLAGS: None   WEIGHT BEARING RESTRICTIONS: No  FALLS: Has patient fallen in last 6 months? Yes. Number of falls 10+ with pt having a fall this morning  LIVING ENVIRONMENT: Lives with: lives with their family and lives with their spouse Lives in: House/apartment Stairs: need to ask Has following equipment at home: Vannie - 4 wheeled, which pt uses if she doesn't have her husband's support  PLOF: Independent with basic ADLs, Independent with household mobility without device, Independent with community mobility without device, and Independent with homemaking with ambulation  PATIENT GOALS: improve dizziness, decrease number of falls, improve balance  OBJECTIVE:  Note: Objective measures were completed at Evaluation unless otherwise noted.  DIAGNOSTIC FINDINGS: No brain imaging in chart EXAM: LUMBAR SPINE - COMPLETE 4+ VIEW COMPARISON:  MRI of lumbar spine December 06, 2022 FINDINGS: There is no evidence of lumbar spine fracture. Alignment is normal. Mild narrow intervertebral spaces and mild anterior spurring are identified in the upper lumbar spine. Bilateral hip replacements are noted. Minimal facet joint sclerosis are noted in the mid to lower lumbar spine.   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  Electronically Signed   By: Craig Farr M.D.   On: 09/18/2023 14:52  COGNITION: Overall cognitive status: Within functional limits for tasks assessed   SENSATION: Not tested  EDEMA:  Not formally measured, but has pitting edema in bilateral lower legs (pt reports being on fluid pill) Pt states she usually wears compression stockings 2-3x/week (not wearing today)  MUSCLE TONE:  Not formally assessed  DTRs:  Achilles: need to assess  POSTURE:  rounded shoulders, forward head, and posterior pelvic tilt  Cervical ROM:    Active Not formally measured, but observation of movement noted A/PROM (deg) eval  Flexion   Extension Decreased from normal   Right lateral flexion   Left lateral flexion   Right rotation   Left rotation Decreased compared  to R  (Blank rows = not tested)  STRENGTH: Not formally assessed  LOWER EXTREMITY MMT:   MMT  Need to be assessed  Right  12/26/2023  Left  12/26/2023  Hip flexion 2- 2-  Hip abduction 4+ 4+  Hip adduction 4+ 4+  Hip internal rotation    Hip external rotation    Knee flexion 4+ 3+  Knee extension 4+ 4  Ankle dorsiflexion 4+ 4+  Ankle plantarflexion 4 4  Ankle inversion    Ankle eversion    (Blank rows = not tested)  *reports pain in R thigh/quad muscle and R anterior hip  Manual Muscle Test Scale 0/5 = No muscle contraction can be seen or felt 1/5 = Contraction can be felt, but there is no motion 2-/5 = Part moves through incomplete ROM w/ gravity decreased 2/5 = Part moves through complete ROM w/ gravity decreased 2+/5 = Part moves through incomplete ROM (<50%) against gravity or through complete ROM w/ gravity 3-/5 = Part moves through incomplete ROM (>50%) against gravity 3/5 = Part moves through complete ROM against gravity 3+/5 = Part moves through complete ROM against gravity/slight resistance 4-/5= Holds test position against slight to moderate pressure 4/5  = Part moves through complete ROM against gravity/moderate resistance 4+/5= Holds test position against moderate to strong pressure 5/5 = Part moves through complete ROM against gravity/full resistance  BED MOBILITY:  Sit to supine Modified independence Supine to sit Modified independence *Patient uses UEs to assist with LE management on/off the mat  TRANSFERS: Assistive device utilized: HHA  Sit to stand: CGA Stand to sit: CGA Chair to chair: CGA Floor: not assessed  RAMP: not assessed  CURB: not assessed  GAIT: Gait pattern: pt reports often feeling she veers towards the LEFT, decreased arm swing- Right, decreased arm swing- Left, decreased step length- Right, decreased step length- Left, and wide BOS Distance walked: ~129ft Assistive device utilized: L HHA vs none Level of assistance: CGA Comments:   FUNCTIONAL TESTS:   10 meter walk test: need to assess  Berg Balance Test: need to assess  Functional Gait Assessment: need to assess   PATIENT SURVEYS:   Dizziness Handicap Inventory [facility]): need to assess   VESTIBULAR ASSESSMENT:    SYMPTOM BEHAVIOR:  Subjective history: see above  Non-Vestibular symptoms: changes in hearing a few years ago, tinnitus, nausea but no vomiting, and hx of migraines years ago but never had to take medication for them  Type of dizziness: Spinning/Vertigo, Lightheadedness/Faint, and spinning when she stands up, states she has significant increased fatigue  Frequency: change positions (lying to sitting, but mostly sit to stand)  Duration: <16minute  Aggravating factors: Induced by position change: supine to sit and sit to stand  Relieving factors: slow movements and trying to stand before starting to walk  Progression of symptoms: slight increase/worsening of symptoms since February  OCULOMOTOR EXAM:  Ocular Alignment: normal  Ocular ROM: No Limitations  Spontaneous Nystagmus: absent  Gaze-Induced Nystagmus: absent  Smooth Pursuits:  impaired with some difficulty tracking, especially when attempting to perform more circular smooth pursuits with pt reporting this causes her to feel dizzy  Saccades: hypometric/undershoots, hypermetric/overshoots, and slow  Convergence/Divergence: unable to converge eyes   VESTIBULAR - OCULAR REFLEX:   Slow VOR: Normal  VOR Cancellation: Corrective Saccades in both directions   Head-Impulse Test: HIT Right: positive HIT Left: positive  Dynamic Visual Acuity: Not able to be assessed   POSITIONAL TESTING:  Loaded Right Dix-Hallpike: no nystagmus, but  positive for symptoms with delayed onset around ~30seconds and pt reporting this is her dizziness Loaded Left Dix-Hallpike: no nystagmus and negative for symptoms  Performed Canalith Repositioning: 1x on R side   MOTION SENSITIVITY:  Motion Sensitivity Quotient Intensity: 0 = none, 1 = Lightheaded, 2 = Mild, 3 = Moderate, 4 = Severe, 5 = Vomiting  Intensity  1. Sitting to supine   2. Supine to L side   3. Supine to R side   4. Supine to sitting   5. L Hallpike-Dix   6. Up from L    7. R Hallpike-Dix   8. Up from R    9. Sitting, head tipped to L knee   10. Head up from L knee   11. Sitting, head tipped to R knee   12. Head up from R knee   13. Sitting head turns x5   14.Sitting head nods x5   15. In stance, 180 turn to L    16. In stance, 180 turn to R     OTHOSTATICS:  12/23/23:  116/64 68bpm supine (no symptoms)  118/61 67bpm seated (no symptoms)  116/65 71bpm standing (no symptoms)  114/68 70bpm standing x 60 sec (no symptoms)  (Negative) *also negative with cardiology                                                                                                                            TREATMENT DATE: 01/06/2024   Reviewed patient's seated VOR X1 exercises: Horizontal 2 x30 seconds Denies symptoms Did not notice additional eye shifts during exercise today Vertical 2x 30 seconds Reports the letter started  getting fussy around 20 seconds the 1st time Did notice slower head nods over increased time Educated pt on trying to maintain consistent speed of head nods throughout the full 30 seconds *Reinforced education to pt on using closer target during VOR X1 exercises Convergence exercise 2x 5 reps Noticed L eye abducts as target gets closer Pt has limited convergence (difficulty measuring convergence distance as this was really challenging for patient today) Seated Horizontal VOR cancellation 2x 10 reps *able to progress all of these to a busy background today  Progressed to standing VOR X1 Horizontal x15reps Vertical x 15reps  * No symptoms and no significant postural sway - only close SBA for safety  Standing VOR Cancellation 2x 10reps Posterior sway/postural instability with L lateral bias requiring CGA for safety/stability  Provided updated HEP printout below.    PATIENT EDUCATION: Education details: Findings of therapy assessment, vestibular testing, vestibular treatment, plan for upcoming sessions Person educated: Patient Education method: Explanation and Handouts Education comprehension: verbalized understanding and needs further education  HOME EXERCISE PROGRAM:  Access Code: [email or web address] URL: [email or web address] Date: 01/06/2024 Prepared by: Connell Kiss  Exercises - Seated Gaze Stabilization with Head Rotation  - 3 x daily - 3 sets - 30sec hold - Seated Gaze Stabilization with Head Nod  - 3 x daily - 3 sets -  30sec hold - Pencil Pushups  - 1 x daily - 7 x weekly - 3 sets - 5 reps - Seated VOR Cancellation  - 1 x daily - 7 x weekly - 3 sets - 10 reps - Sit to Stand with Arms Crossed  - 3 x daily - 1 sets - 10 reps - Standing March with Counter Support  - 1 x daily - 7 x weekly - 2 sets - 10-15 reps - Narrow Stance with Head Nods and Counter Support  - 1 x daily - 7 x weekly - 2 sets - 10 reps    GOALS: Goals reviewed with patient? Yes  SHORT TERM  GOALS: Target date: 01/21/2024   Patient will be independent in home exercise program to improve gaze stabilization and/or mobility for improved functional independence with ADLs while experiencing decreased dizziness symptoms. Baseline: initiated on 12/23/2023 Goal status: INITIAL   LONG TERM GOALS: Target date: 03/03/2024  Patient will reduce dizziness handicap inventory [facility]) score to <36, for less dizziness with ADLs and increased safety with home and work tasks.  Baseline: 48% Goal status: INITIAL  2.  Patient will deny any falls over past 4 weeks to demonstrate improved balance and safe independence with functional mobility.  Baseline: reports frequent falls with 1 on day of initial eval Goal status: INITIAL  3.  Patient will increase Berg Balance score to > 51/56 to demonstrate improved balance and decreased fall risk during functional activities and ADLs.  Baseline: need to assess Goal status: INITIAL  4.  Patient will increase 10 meter walk test to >1.52m/s using LRAD as to improve gait speed for better community ambulation and to reduce fall risk. Baseline: 0.29m/s  Goal status: INITIAL  5.  Patient will increase Functional Gait Assessment (FGA) score to >20/30 as to reduce fall risk and improve dynamic gait safety with community ambulation.  Baseline: 15/30 Goal status: INITIAL  ASSESSMENT:  CLINICAL IMPRESSION:  Patient is a 73 y.o. female who was seen today for physical therapy treatment for dizziness and imbalance with frequent falls. Patient reports only 1 episode of vertigo since last session, lasting only a minute when rolling on her R side to get up off the couch. Due to time constraints with patient's late arrival, did not perform repeat positional vestibular testing today. Therapy session focused on review of gaze stabilization exercises as well as progression to them with a busy background and in standing. Added both convergence and VOR cancellation exercises today  with these both being challenging with pt having limited ability to adduct L eye during convergence as well as pt having impaired standing balance during VOR cancellation with L posterior biased LOB. Updated pt's HEP to include these in sitting for safety.  Educated pt to continue performing seated gaze stabilization and standing balance exercises at home as prescribed with plan to further progress these in upcoming visits as well as trial optokinetic training videos. The pt will benefit from further skilled PT to improve positional and movement evoked dizziness and balance in order to return pt to PLOF an increase QOL.    OBJECTIVE IMPAIRMENTS: Abnormal gait, cardiopulmonary status limiting activity, decreased activity tolerance, decreased balance, decreased coordination, decreased endurance, decreased knowledge of condition, decreased knowledge of use of DME, decreased mobility, difficulty walking, decreased strength, and dizziness.   ACTIVITY LIMITATIONS: carrying, lifting, bending, standing, squatting, transfers, bed mobility, bathing, toileting, dressing, reach over head, hygiene/grooming, and locomotion level  PARTICIPATION LIMITATIONS: meal prep, cleaning, laundry, shopping, community activity, and  yard work  PERSONAL FACTORS: Age, Time since onset of injury/illness/exacerbation, and 3+ comorbidities: Arthritis, clotting disorder, GERD, IBS, L TKA, spastic bladder are also affecting patient's functional outcome.   REHAB POTENTIAL: Good  CLINICAL DECISION MAKING: Evolving/moderate complexity  EVALUATION COMPLEXITY: Moderate   PLAN:  PT FREQUENCY: 1-2x/week  PT DURATION: 12 weeks  PLANNED INTERVENTIONS: 97164- PT Re-evaluation, 97750- Physical Performance Testing, 97110-Therapeutic exercises, 97530- Therapeutic activity, 97112- Neuromuscular re-education, 97535- Self Care, [id number]- Manual therapy, [phone]- Gait training, [phone]- Orthotic Initial, [phone]- Orthotic/Prosthetic subsequent, [phone]-  Canalith repositioning, Patient/Family education, Balance training, Stair training, Taping, Joint mobilization, Spinal mobilization, Vestibular training, Visual/preceptual remediation/compensation, DME instructions, Cryotherapy, Moist heat, and Biofeedback  PLAN FOR UPCOMING SESSIONS:  - perform positional vestibular testing with goggles pending pt's symptoms since last visit - Berg Balance Test - follow-up on progression of gaze stabilization exercises  - X1 and VOR cancellation    - progress standing  - convergence exercises - motion sensitivity quotient - optokinetic training videos  - educate on sign/symptoms of CVA - assess DTRs if indicated    Kalaya Infantino, PT, DPT, NCS, CSRS Physical Therapist - [facility]  [facility]  11:53 AM 01/06/24

## 2024-01-06 NOTE — Telephone Encounter (Signed)
 Pt contacted via telephone and author left voice mail informing of missed appointment and informed pt of future PT appointment date and time.   Casimiro Needle, PT, DPT, NCS, CSRS

## 2024-01-13 ENCOUNTER — Ambulatory Visit: Admitting: Physical Therapy

## 2024-01-13 DIAGNOSIS — M6281 Muscle weakness (generalized): Secondary | ICD-10-CM | POA: Diagnosis not present

## 2024-01-13 DIAGNOSIS — R42 Dizziness and giddiness: Secondary | ICD-10-CM

## 2024-01-13 DIAGNOSIS — R2681 Unsteadiness on feet: Secondary | ICD-10-CM | POA: Diagnosis not present

## 2024-01-13 DIAGNOSIS — R29898 Other symptoms and signs involving the musculoskeletal system: Secondary | ICD-10-CM

## 2024-01-13 DIAGNOSIS — R296 Repeated falls: Secondary | ICD-10-CM | POA: Diagnosis not present

## 2024-01-13 NOTE — Therapy (Signed)
 OUTPATIENT PHYSICAL THERAPY VESTIBULAR TREATMENT     Patient Name: Jennifer Frank MRN: [id number] DOB:November 07, 1950, 73 y.o., female Today's Date: 01/13/2024  END OF SESSION:   PT End of Session - 01/13/24 1157     Visit Number 7    Number of Visits 24    Date for PT Re-Evaluation 03/03/24    Authorization Type BCBS Medicare    Progress Note Due on Visit 10    PT Start Time 1155    PT Stop Time 1237    PT Time Calculation (min) 42 min    Equipment Utilized During Treatment Gait belt    Activity Tolerance Patient tolerated treatment well;No increased pain    Behavior During Therapy WFL for tasks assessed/performed              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   Fundic gland polyps of stomach, benign    GERD (gastroesophageal reflux disease)    past hx    Hearing loss    IBS (irritable bowel syndrome)    Personal history of colonic polyps - adenoma 09/22/2013   PONV (postoperative nausea and vomiting)    Reflux    Past Surgical History:  Procedure Laterality Date   HIP ARTHROSCOPY Left 1997   KNEE ARTHROSCOPY Left 1996   KNEE ARTHROSCOPY Right    05-2016 same time as left knee replacement    POLYPECTOMY     TONSILLECTOMY AND ADENOIDECTOMY  1957   TOTAL KNEE ARTHROPLASTY Bilateral 05/27/2016   Procedure: LEFT TOTAL KNEE ARTHROPLASTY WITH RIGHT KNEE ARTHROSCOPY;  Surgeon: Dempsey Sensor, MD;  Location: [facility] OR;  Service: Orthopedics;  Laterality: Bilateral;   UPPER GASTROINTESTINAL ENDOSCOPY     WISDOM TOOTH EXTRACTION     Patient Active Problem List   Diagnosis Date Noted   Syncope 09/14/2023   Other social stressor 07/09/2023   Insomnia 07/09/2023   Back pain 03/12/2023   Skin irritation 03/12/2023   Tremor 09/08/2022   Osteopenia 02/11/2022   SOB (shortness of breath) 05/27/2021   Aortic atherosclerosis (HCC) 03/12/2020   Medicare annual wellness visit, subsequent  01/10/2019   Advance care planning 12/14/2017   Urinary incontinence 12/14/2017   Primary osteoarthritis of left knee 05/26/2016   Health care maintenance 04/22/2016   Hearing loss 04/22/2014   Varicose veins of bilateral lower extremities with other complications 01/25/2013   Edema 01/25/2013   Hyperlipidemia 02/16/2007   GERD 02/16/2007    PCP: [name], MD REFERRING PROVIDER: [name], MD   REFERRING DIAG: R42 (ICD-10-CM) - Dizziness   THERAPY DIAG:  Muscle weakness (generalized)  Dizziness and giddiness  Unsteadiness on feet  Repeated falls  Weakness of lower extremity, unspecified laterality  ONSET DATE: Around February 2025  Rationale for Evaluation and Treatment: Rehabilitation  SUBJECTIVE:   SUBJECTIVE STATEMENT:  Pt reports she is awaiting her MRI results. Patient states her knees are a little stiff this morning. Pt reports not having any additional episodes of dizziness since last session. States she watches when she stands up to move more slowly, cautiously and stands there for a little bit before she starts walking. Pt reports her HEP is going well. Patient states she is having a learning curve with the pencil push-up exercise. Pt states she does occasionally have R hand tremors with the eye exercises, which has been present for a while and she was seen by  a Neurologist last year (or before) to rule out Parkinson's. Pt states VOR cancellation is also a work in progress.   Pt states she has noticeable decreased strength in R quad compared to L LE. Pt states she has to assist herself with lifting her R LE into the car with some pain in R quad and anterior hip.   Discussed B LE edema and use of compression stockings as recommended by her MD. Pt states she doesn't get dizziness symptoms in the car while driving.  Pt reports she also would like to continue focusing on B LE functional strength once vestibular symptoms are improved.   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    From Initial Eval: Pt states 2 weeks ago her PCP, Dr. Cleatus, referred her to cardiology and they did an EKG showing normal sinus rhythm. States she did wear heart monitor for 7 days with potential abnormal rhythm at night of tachycardia. Pt states cardiologist felt she had vertigo and referred her to ENT. Pt states a few years ago she was told she had loss of hearing. Pt states she has learned to lip read really well since then while she saves up money to afford hearing aides. States she visited ENT and had debridement of her ears and they confirmed she needs hearing aides. Pt states ENT didn't see vertigo and referred her to vestibular rehab.   Pt states she has frequent falls and fell this morning when getting up from seated position with L LOB. States she often falls to her LEFT, but sometimes face forward. States when she stands up she has to stand for a little while because if she moves too quick everything is revolving/spinning and she gets nauseous.   Pt states last year or beginning of this year she had EMG and MRI, then was referred to PT for strength training. Pt states imaging found lumbar spine has bulging discs and that the physical therapist at the time discontinued care due to concern for exacerbating her lumbar spine symptoms. Pt states she did her exercises for a little while, but then gradually stopped doing them.  Pt states she uses rollator when her husband is not with her for security and to have seat in tow. Pt states she has sudden onset anxious feeling with no known provocation and then she starts pushing AD too far out in front of her causing instability.   Pt says she has tremors in R hand and she was assessed for Parkinson's due to having family hx on her dad's side. Pt states she saw Neurologist last year and they said she didn't have  Parkinson's. Pt also reports having quivering in R LE.  Pt reports this morning she had some numbness down her R UE down to her middle finger, but it is gone now.  Pt states if she stands with both of her hands up over her head she will blackout and pass out because she was doing a task overhead years ago and this happened. States she can raise 1 arm overhead and she is OK.  Pt accompanied by: self, husband in waiting room [name])  Per ENT note on July 21st, 2025: Patient was referred to ENT by Arlyss Cleatus. Pt having vertigo described as dizziness, imbalance, light-headedness, and nausea. Pt has associated nausea and vomiting. Vertigo has been present for 6 months. Typically having episodes daily. Vertigo  developed suddenly. The vertigo is positional, occurring while looking up, standing up, lying flat, and upon getting up. Episodes typically last for seconds. The symptoms are made worse by movement. ENT performed bilateral cerumen debridement with microscopy on 12/08/2023. Hall pike was negative bilaterally by ENT. Planning for VNG testing and referred patient to vestibular rehab. Therapy ordered: Vestibular rehabilitation, Habituation exercises, and Substitution exercises. 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 (See chart for full details)  Per cardiology note on 11/17/2023: Patient has syncopal episodes ongoing over the past 1 to 2 years. She walks with a walker sometimes due to lower extremity weakness. Cardiac monitor placed last month showed occasional nonsustained SVT, no sustained arrhythmias. Echocardiogram 08/2023 showed normal EF. No significant structural abnormalities. Orthostatic vitals today with no evidence for orthostasis.    PERTINENT HISTORY: Arthritis, clotting disorder, GERD, IBS, L TKA, spastic bladder  Updated notes from ENT visit on 12/25/2023: RESULTS OF VNG: No nystagmus or subjective dizziness was observed during [facility]. No spontaneous  nystagmus was observed. Smooth pursuit was abnormal during the left cycles only. Saccades were normal. Optokinetic testing was abnormal in the faster speed to the right side in the left eye only. There was post-shake nystagmus, with four beats/second to the left side. Positional testing was abnormal, with eight beats/second of ageotropic, fatiguing, fixating nystagmus in body left and body right recordings. Calorics were abnormal, revealing a 38% left unilateral weakness.   IMPRESSION: Abnormal VNG. A central pathology cannot be ruled out based on today's abnormal test findings: abnormal smooth pursuit and OPKs. Post-headshake nystagmus is consistent with left weakness. Ageotropic, fatiguing nystagmus that fixates is consistent with peripheral pathology, however, was not present in repeat testing with VNG goggles off and eyes open. There was no indication of BPPV. 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    PAIN:  Are you having pain? No  PRECAUTIONS: Fall  RED FLAGS: None   WEIGHT BEARING RESTRICTIONS: No  FALLS: Has patient fallen in last 6 months? Yes. Number of falls 10+ with pt having a fall this morning  LIVING ENVIRONMENT: Lives with: lives with their family and lives with their spouse Lives in: House/apartment Stairs: need to ask Has following equipment at home: Vannie - 4 wheeled, which pt uses if she doesn't have her husband's support  PLOF: Independent with basic ADLs, Independent with household mobility without device, Independent with community mobility without device, and Independent with homemaking with ambulation  PATIENT GOALS: improve dizziness, decrease number of falls, improve balance  OBJECTIVE:  Note: Objective measures were completed at Evaluation unless otherwise noted.  DIAGNOSTIC FINDINGS: No brain imaging in chart EXAM: LUMBAR SPINE - COMPLETE 4+  VIEW COMPARISON:  MRI of lumbar spine December 06, 2022 FINDINGS: There is no evidence of lumbar spine fracture. Alignment is normal. Mild narrow intervertebral spaces and mild anterior spurring are identified in the upper lumbar spine. Bilateral hip replacements are noted. Minimal facet joint sclerosis are noted in the mid to lower lumbar spine.   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  Electronically Signed   By: Craig Farr M.D.   On: 09/18/2023 14:52  COGNITION: Overall cognitive status: Within functional limits for tasks assessed   SENSATION: Not tested  EDEMA:  Not formally measured, but has pitting edema in bilateral lower legs (pt reports being on fluid pill) Pt states she usually wears compression stockings 2-3x/week (not wearing today)  MUSCLE TONE:  Not formally assessed  DTRs:  Achilles: need to assess  POSTURE:  rounded shoulders, forward head, and posterior pelvic tilt  Cervical ROM:    Active Not formally measured, but observation of movement noted A/PROM (deg) eval  Flexion   Extension Decreased from normal   Right lateral flexion   Left lateral flexion   Right rotation   Left rotation Decreased compared to R  (Blank rows = not tested)  STRENGTH: Not formally assessed  LOWER EXTREMITY MMT:   MMT  Need to be assessed  Right  12/26/2023  Left  12/26/2023  Hip flexion 2- 2-  Hip abduction 4+ 4+  Hip adduction 4+ 4+  Hip internal rotation    Hip external rotation    Knee flexion 4+ 3+  Knee extension 4+ 4  Ankle dorsiflexion 4+ 4+  Ankle plantarflexion 4 4  Ankle inversion    Ankle eversion    (Blank rows = not tested)  *reports pain in R thigh/quad muscle and R anterior hip  Manual Muscle Test Scale 0/5 = No muscle contraction can be seen or felt 1/5 = Contraction can be felt, but there is no motion 2-/5 = Part moves through incomplete ROM w/ gravity decreased 2/5 = Part moves through complete ROM w/  gravity decreased 2+/5 = Part moves through incomplete ROM (<50%) against gravity or through complete ROM w/ gravity 3-/5 = Part moves through incomplete ROM (>50%) against gravity 3/5 = Part moves through complete ROM against gravity 3+/5 = Part moves through complete ROM against gravity/slight resistance 4-/5= Holds test position against slight to moderate pressure 4/5 = Part moves through complete ROM against gravity/moderate resistance 4+/5= Holds test position against moderate to strong pressure 5/5 = Part moves through complete ROM against gravity/full resistance  BED MOBILITY:  Sit to supine Modified independence Supine to sit Modified independence *Patient uses UEs to assist with LE management on/off the mat  TRANSFERS: Assistive device utilized: HHA  Sit to stand: CGA Stand to sit: CGA Chair to chair: CGA Floor: not assessed  RAMP: not assessed  CURB: not assessed  GAIT: Gait pattern: pt reports often feeling she veers towards the LEFT, decreased arm swing- Right, decreased arm swing- Left, decreased step length- Right, decreased step length- Left, and wide BOS Distance walked: ~126ft Assistive device utilized: L HHA vs none Level of assistance: CGA Comments:   FUNCTIONAL TESTS:   10 meter walk test: need to assess  Berg Balance Test: need to assess  Functional Gait Assessment: need to assess   PATIENT SURVEYS:   Dizziness Handicap Inventory [facility]): need to assess   VESTIBULAR ASSESSMENT:    SYMPTOM BEHAVIOR:  Subjective history: see above  Non-Vestibular symptoms: changes in hearing a few years ago, tinnitus, nausea but no vomiting, and hx of migraines years ago but never had to take medication for them  Type of dizziness: Spinning/Vertigo, Lightheadedness/Faint, and spinning when she stands up, states she has significant increased fatigue  Frequency: change positions (lying to sitting, but mostly sit to stand)  Duration: <31minute  Aggravating factors:  Induced by position change: supine to sit and sit to stand  Relieving factors: slow movements and trying to stand before starting to walk  Progression of symptoms: slight increase/worsening of symptoms since February  OCULOMOTOR EXAM:  Ocular Alignment: normal  Ocular ROM: No Limitations  Spontaneous Nystagmus: absent  Gaze-Induced Nystagmus: absent  Smooth Pursuits: impaired with some difficulty tracking, especially when attempting to perform more circular smooth pursuits with pt reporting  this causes her to feel dizzy  Saccades: hypometric/undershoots, hypermetric/overshoots, and slow  Convergence/Divergence: unable to converge eyes   VESTIBULAR - OCULAR REFLEX:   Slow VOR: Normal  VOR Cancellation: Corrective Saccades in both directions   Head-Impulse Test: HIT Right: positive HIT Left: positive  Dynamic Visual Acuity: Not able to be assessed   POSITIONAL TESTING:  Loaded Right Dix-Hallpike: no nystagmus, but positive for symptoms with delayed onset around ~30seconds and pt reporting this is her dizziness Loaded Left Dix-Hallpike: no nystagmus and negative for symptoms  Performed Canalith Repositioning: 1x on R side   MOTION SENSITIVITY:  Motion Sensitivity Quotient Intensity: 0 = none, 1 = Lightheaded, 2 = Mild, 3 = Moderate, 4 = Severe, 5 = Vomiting  Intensity  1. Sitting to supine   2. Supine to L side   3. Supine to R side   4. Supine to sitting   5. L Hallpike-Dix   6. Up from L    7. R Hallpike-Dix   8. Up from R    9. Sitting, head tipped to L knee   10. Head up from L knee   11. Sitting, head tipped to R knee   12. Head up from R knee   13. Sitting head turns x5   14.Sitting head nods x5   15. In stance, 180 turn to L    16. In stance, 180 turn to R     OTHOSTATICS:  12/23/23:  116/64 68bpm supine (no symptoms)  118/61 67bpm seated (no symptoms)  116/65 71bpm standing (no symptoms)  114/68 70bpm standing x 60 sec (no symptoms)  (Negative) *also  negative with cardiology                                                                                                                            TREATMENT DATE: 01/13/2024  Therapy session focused on reviewing and progressing patient's HEP targeting gaze stabilization, standing balance, and B LE functional strengthening. This includes the following with therapist providing cuing throughout for proper form/technique:  Convergence exercise (aka pencil push-ups) x10reps  Performs at very slow speed, but no significant abduction noticed on either eye today; however, patient only able to converge eyes up to ~7.5cm - pt has difficulty sustaining/holding her eyes on a target this close making it difficult to measure  Seated horizontal VOR cancellation 2x 10 reps Cuing patient to move at a more consistent, quick pace Educated pt on potential use of metronome to maintain speed throughout the exercise Standing marches at counter with  B UE support 2x10reps per LE Cuing to ensure performing hip flexion rather than just knee flexion Pt reports feeling it a little in R anterior hip Standing NBOS at counter with hands hovering during vertical head nods 2x 10 reps Noticed slight postural sway with this; cuing to move through greater cervical flexion/extension ROM as pt having very minimal head movement initially Supine heel slides X5 reps per LE Requires assist  of strap to perform with R LE and patient having some anterior hip pain with this Educated patient to stop this exercise at home if it causes increased R anterior hip discomfort/pain Supine bridge X15 reps with good form and good hip clearance   Provided updated HEP printout below.    PATIENT EDUCATION: Education details: Findings of therapy assessment, vestibular testing, vestibular treatment, plan for upcoming sessions Person educated: Patient Education method: Explanation and Handouts Education comprehension: verbalized understanding  and needs further education  HOME EXERCISE PROGRAM:  Access Code: [email or web address] URL: [email or web address] Date: 01/13/2024 Prepared by: Connell Kiss  Exercises - Seated Gaze Stabilization with Head Rotation  - 3 x daily - 3 sets - 30sec hold - Seated Gaze Stabilization with Head Nod  - 3 x daily - 3 sets - 30sec hold - Pencil Pushups  - 1 x daily - 7 x weekly - 3 sets - 10 reps - Seated VOR Cancellation  - 1 x daily - 7 x weekly - 3 sets - 10 reps - Sit to Stand with Arms Crossed  - 3 x daily - 1 sets - 10 reps - Standing March with Counter Support  - 1 x daily - 7 x weekly - 2 sets - 10-15 reps - Narrow Stance with Head Nods and Counter Support  - 1 x daily - 7 x weekly - 2 sets - 10 reps - Supine Heel Slide with Strap  - 2 x daily - 7 x weekly - 2 sets - 5 reps - Supine Bridge  - 1 x daily - 7 x weekly - 3 sets - 15 reps    GOALS: Goals reviewed with patient? Yes  SHORT TERM GOALS: Target date: 01/21/2024   Patient will be independent in home exercise program to improve gaze stabilization and/or mobility for improved functional independence with ADLs while experiencing decreased dizziness symptoms. Baseline: initiated on 12/23/2023 Goal status: INITIAL   LONG TERM GOALS: Target date: 03/03/2024  Patient will reduce dizziness handicap inventory [facility]) score to <36, for less dizziness with ADLs and increased safety with home and work tasks.  Baseline: 48% Goal status: INITIAL  2.  Patient will deny any falls over past 4 weeks to demonstrate improved balance and safe independence with functional mobility.  Baseline: reports frequent falls with 1 on day of initial eval Goal status: INITIAL  3.  Patient will increase Berg Balance score to > 51/56 to demonstrate improved balance and decreased fall risk during functional activities and ADLs.  Baseline: need to assess Goal status: INITIAL  4.  Patient will increase 10 meter walk test to >1.68m/s using LRAD as to improve  gait speed for better community ambulation and to reduce fall risk. Baseline: 0.14m/s  Goal status: INITIAL  5.  Patient will increase Functional Gait Assessment (FGA) score to >20/30 as to reduce fall risk and improve dynamic gait safety with community ambulation.  Baseline: 15/30 Goal status: INITIAL  ASSESSMENT:  CLINICAL IMPRESSION:  Patient is a 73 y.o. female who was seen today for physical therapy treatment for dizziness and imbalance with frequent falls as well as B LE functional strength deficits. Patient reports no additional episodes of vertigo/dizziness since last therapy session. Therapy session focused on review of recently added gaze stabilization exercises including: convergence and VOR cancellation. Patient continues to report these as being challenging, but pt demonstrating improving ability to adduct L eye during convergence and increasing speed of movement during VOR cancellation. Therapist also focused on reviewing patient's standing  balance and B LE functional strengthening exercises as patient reports not performing these consistently at home. Patient continues to demonstrate significant R>L hip flexor weakness requiring assistance to lift her LEs onto the mat table when transitioning from sit>supine; therefore, added exercises specifically targeting this deficit. The pt will benefit from further skilled PT to improve positional and movement evoked dizziness and imbalance as well as B LE strength deficits in order to return pt to PLOF and increase QOL.    OBJECTIVE IMPAIRMENTS: Abnormal gait, cardiopulmonary status limiting activity, decreased activity tolerance, decreased balance, decreased coordination, decreased endurance, decreased knowledge of condition, decreased knowledge of use of DME, decreased mobility, difficulty walking, decreased strength, and dizziness.   ACTIVITY LIMITATIONS: carrying, lifting, bending, standing, squatting, transfers, bed mobility, bathing,  toileting, dressing, reach over head, hygiene/grooming, and locomotion level  PARTICIPATION LIMITATIONS: meal prep, cleaning, laundry, shopping, community activity, and yard work  PERSONAL FACTORS: Age, Time since onset of injury/illness/exacerbation, and 3+ comorbidities: Arthritis, clotting disorder, GERD, IBS, L TKA, spastic bladder are also affecting patient's functional outcome.   REHAB POTENTIAL: Good  CLINICAL DECISION MAKING: Evolving/moderate complexity  EVALUATION COMPLEXITY: Moderate   PLAN:  PT FREQUENCY: 1-2x/week  PT DURATION: 12 weeks  PLANNED INTERVENTIONS: 97164- PT Re-evaluation, 97750- Physical Performance Testing, 97110-Therapeutic exercises, 97530- Therapeutic activity, 97112- Neuromuscular re-education, 97535- Self Care, [id number]- Manual therapy, [phone]- Gait training, [phone]- Orthotic Initial, [phone]- Orthotic/Prosthetic subsequent, [phone]- Canalith repositioning, Patient/Family education, Balance training, Stair training, Taping, Joint mobilization, Spinal mobilization, Vestibular training, Visual/preceptual remediation/compensation, DME instructions, Cryotherapy, Moist heat, and Biofeedback  PLAN FOR UPCOMING SESSIONS:  - follow-up on brain MRI results - perform positional vestibular testing with goggles if pt reports positional vertigo symptoms since last visit - Berg Balance Test - follow-up on progression of gaze stabilization exercises  - convergence and VOR cancellation    - progress to standing - B LE functional strengthening:  - gait uphill on treadmill (monitor vitals before and after) to address hip flexor weakness  - stair navigation for strengthening  - standing foot taps to step - add AW when appropriate  - motion sensitivity quotient - optokinetic training videos  - educate on sign/symptoms of CVA    Darrien Belter, PT, DPT, NCS, CSRS Physical Therapist - [facility]  [facility]  12:39 PM 01/13/24

## 2024-01-15 ENCOUNTER — Ambulatory Visit: Admitting: Physical Therapy

## 2024-01-15 DIAGNOSIS — R2681 Unsteadiness on feet: Secondary | ICD-10-CM | POA: Diagnosis not present

## 2024-01-15 DIAGNOSIS — R29898 Other symptoms and signs involving the musculoskeletal system: Secondary | ICD-10-CM | POA: Diagnosis not present

## 2024-01-15 DIAGNOSIS — R42 Dizziness and giddiness: Secondary | ICD-10-CM

## 2024-01-15 DIAGNOSIS — R296 Repeated falls: Secondary | ICD-10-CM | POA: Diagnosis not present

## 2024-01-15 DIAGNOSIS — M6281 Muscle weakness (generalized): Secondary | ICD-10-CM
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
## 2024-01-22 ENCOUNTER — Ambulatory Visit: Attending: Unknown Physician Specialty | Admitting: Physical Therapy

## 2024-01-22 DIAGNOSIS — R42 Dizziness and giddiness: Secondary | ICD-10-CM | POA: Diagnosis not present

## 2024-01-22 DIAGNOSIS — M6281 Muscle weakness (generalized): Secondary | ICD-10-CM | POA: Diagnosis not present

## 2024-01-22 DIAGNOSIS — R29898 Other symptoms and signs involving the musculoskeletal system: Secondary | ICD-10-CM | POA: Diagnosis not present

## 2024-01-22 DIAGNOSIS — R2681 Unsteadiness on feet: Secondary | ICD-10-CM | POA: Insufficient documentation

## 2024-01-22 DIAGNOSIS — R296 Repeated falls: Secondary | ICD-10-CM | POA: Diagnosis not present

## 2024-01-22 NOTE — Therapy (Signed)
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 decreased endurance, decreased knowledge of condition, decreased knowledge of use of DME, decreased mobility, difficulty walking, decreased strength, and dizziness.   ACTIVITY LIMITATIONS: carrying, lifting, bending, standing, squatting, transfers, bed mobility, bathing, toileting, dressing, reach over head, hygiene/grooming, and locomotion level  PARTICIPATION LIMITATIONS: meal prep, cleaning, laundry, shopping, community activity, and yard work  PERSONAL FACTORS: Age, Time since onset of injury/illness/exacerbation, and 3+ comorbidities: Arthritis, clotting disorder, GERD, IBS, L TKA, spastic bladder are also affecting patient's functional outcome.    REHAB POTENTIAL: Good  CLINICAL DECISION MAKING: Evolving/moderate complexity  EVALUATION COMPLEXITY: Moderate   PLAN:  PT FREQUENCY: 1-2x/week  PT DURATION: 12 weeks  PLANNED INTERVENTIONS: 97164- PT Re-evaluation, 97750- Physical Performance Testing, 97110-Therapeutic exercises, 97530- Therapeutic activity, 97112- Neuromuscular re-education, 97535- Self Care, [id number]- Manual therapy, [phone]- Gait training, [phone]- Orthotic Initial, [phone]- Orthotic/Prosthetic subsequent, [phone]- Canalith repositioning, Patient/Family education, Balance training, Stair training, Taping, Joint mobilization, Spinal mobilization, Vestibular training, Visual/preceptual remediation/compensation, DME instructions, Cryotherapy, Moist heat, and Biofeedback  PLAN FOR UPCOMING SESSIONS: ***  *Progress Note*  - B LE functional strengthening:  - gait on treadmill, progress to uphill (monitor vitals before and after) to address hip flexor weakness  - sidelying clamshells - add to HEP if appropriate  - reciprocal stair navigation for strengthening  - standing foot taps to step - continue 3lb+ AW resistance as appropriate - follow-up on progression of gaze stabilization exercises  - convergence and VOR cancellation    - progress to standing - perform positional vestibular testing with goggles if pt reports onset of positional vertigo symptoms again    Connell Kiss, PT, DPT, NCS, CSRS Physical Therapist - [facility]  [facility]  11:50 AM 01/22/24

## 2024-01-26 ENCOUNTER — Ambulatory Visit

## 2024-01-28 ENCOUNTER — Ambulatory Visit: Admitting: Physical Therapy

## 2024-01-29 ENCOUNTER — Ambulatory Visit: Admitting: Physical Therapy

## 2024-02-03 ENCOUNTER — Ambulatory Visit

## 2024-02-05 ENCOUNTER — Ambulatory Visit: Admitting: Physical Therapy

## 2024-02-05 DIAGNOSIS — R296 Repeated falls: Secondary | ICD-10-CM

## 2024-02-05 DIAGNOSIS — R42 Dizziness and giddiness: Secondary | ICD-10-CM

## 2024-02-05 DIAGNOSIS — R2681 Unsteadiness on feet: Secondary | ICD-10-CM | POA: Diagnosis not present

## 2024-02-05 DIAGNOSIS — M6281 Muscle weakness (generalized): Secondary | ICD-10-CM

## 2024-02-05 DIAGNOSIS — R29898 Other symptoms and signs involving the musculoskeletal system: Secondary | ICD-10-CM | POA: Diagnosis not present

## 2024-02-05 NOTE — Therapy (Signed)
 OUTPATIENT PHYSICAL THERAPY VESTIBULAR TREATMENT Physical Therapy Progress Note   Dates of reporting period  12/10/2023   to   02/05/2024     Patient Name: Jennifer Frank MRN: [id number] DOB:1950-09-27, 73 y.o., female Today's Date: 02/05/2024  END OF SESSION:   PT End of Session - 02/05/24 1318     Visit Number 10    Number of Visits 24    Date for Recertification  03/03/24    Authorization Type [employer] Medicare    Progress Note Due on Visit 20    PT Start Time 1318    PT Stop Time 1404    PT Time Calculation (min) 46 min    Equipment Utilized During Treatment Gait belt    Activity Tolerance Patient tolerated treatment well;No increased pain    Behavior During Therapy WFL for tasks assessed/performed           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   Fundic gland polyps of stomach, benign    GERD (gastroesophageal reflux disease)    past hx    Hearing loss    IBS (irritable bowel syndrome)    Personal history of colonic polyps - adenoma 09/22/2013   PONV (postoperative nausea and vomiting)    Reflux    Past Surgical History:  Procedure Laterality Date   HIP ARTHROSCOPY Left 1997   KNEE ARTHROSCOPY Left 1996   KNEE ARTHROSCOPY Right    05-2016 same time as left knee replacement    POLYPECTOMY     TONSILLECTOMY AND ADENOIDECTOMY  1957   TOTAL KNEE ARTHROPLASTY Bilateral 05/27/2016   Procedure: LEFT TOTAL KNEE ARTHROPLASTY WITH RIGHT KNEE ARTHROSCOPY;  Surgeon: Dempsey Sensor, MD;  Location: [facility] OR;  Service: Orthopedics;  Laterality: Bilateral;   UPPER GASTROINTESTINAL ENDOSCOPY     WISDOM TOOTH EXTRACTION     Patient Active Problem List   Diagnosis Date Noted   Syncope 09/14/2023   Other social stressor 07/09/2023   Insomnia 07/09/2023   Back pain 03/12/2023   Skin irritation 03/12/2023   Tremor 09/08/2022   Osteopenia 02/11/2022   SOB (shortness of breath) 05/27/2021    Aortic atherosclerosis (HCC) 03/12/2020   Medicare annual wellness visit, subsequent 01/10/2019   Advance care planning 12/14/2017   Urinary incontinence 12/14/2017   Primary osteoarthritis of left knee 05/26/2016   Health care maintenance 04/22/2016   Hearing loss 04/22/2014   Varicose veins of bilateral lower extremities with other complications 01/25/2013   Edema 01/25/2013   Hyperlipidemia 02/16/2007   GERD 02/16/2007    PCP: [name], MD REFERRING PROVIDER: [name], MD   REFERRING DIAG: R42 (ICD-10-CM) - Dizziness   THERAPY DIAG:  Muscle weakness (generalized)  Dizziness and giddiness  Unsteadiness on feet  Repeated falls  Weakness of lower extremity, unspecified laterality  ONSET DATE: Around February 2025  Rationale for Evaluation and Treatment: Rehabilitation  SUBJECTIVE:   SUBJECTIVE STATEMENT:  Pt states she hasn't had any dizziness/room spinning sensation, but she has had several instances of lightheadedness where she felt like she was going to fall. Pt states I don't know if my blood pressure is dropping, or my sugar is dropping, or what. Pt states she has stumbled trying to walk up the steps at home. Pt states her R LE is now tremoring like her R hand. Pt states she is still concerned she might have beginning stages of Parkinson's because  it is coming on like it did in her Uncle. Pt states she is stumbling with the associated lightheadedness, but denies falls. Pt states she is conciencious of moving slow during supine>sit and sit>stand transitions. Pt states last episode of lightheadedness was night before, last (Tuesday night). Pt states she was a little sore after last session.  Pt reports she would like to continue focusing on B LE functional strength now that her vestibular symptoms are improved.    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    From Initial Eval: Pt states 2 weeks ago her PCP, Dr. Cleatus, referred her to cardiology and they did an EKG showing normal sinus rhythm. States she did wear heart monitor for 7 days with potential abnormal rhythm at night of tachycardia. Pt states cardiologist felt she had vertigo and referred her to ENT. Pt states a few years ago she was told she had loss of hearing. Pt states she has learned to lip read really well since then while she saves up money to afford hearing aides. States she visited ENT and had debridement of her ears and they confirmed she needs hearing aides. Pt states ENT didn't see vertigo and referred her to vestibular rehab.   Pt states she has frequent falls and fell this morning when getting up from seated position with L LOB. States she often falls to her LEFT, but sometimes face forward. States when she stands up she has to stand for a little while because if she moves too quick everything is revolving/spinning and she gets nauseous.   Pt states last year or beginning of this year she had EMG and MRI, then was referred to PT for strength training. Pt states imaging found lumbar spine has bulging discs and that the physical therapist at the time discontinued care due to concern for exacerbating her lumbar spine symptoms. Pt states she did her exercises for a little while, but then gradually stopped doing them.  Pt states she uses rollator when her husband is not with her for security and to have seat in tow. Pt states she has sudden onset anxious feeling with no known provocation and then she starts pushing AD too far out in front of her causing instability.   Pt says she has tremors in R hand and she was assessed for Parkinson's due to having family hx on her dad's side. Pt states she saw Neurologist last year and they said she didn't have Parkinson's. Pt also reports having quivering in R LE.  Pt reports this morning she had some numbness down her R UE down to  her middle finger, but it is gone now.  Pt states if she stands with both of her hands up over her head she will blackout and pass out because she was doing a task overhead years ago and this happened. States she can raise 1 arm overhead and she is OK.  Pt accompanied by: self, husband in waiting room [name])  Per ENT note on July 21st, 2025: Patient was referred to ENT by Arlyss Cleatus. Pt having vertigo described as dizziness, imbalance, light-headedness, and nausea. Pt has associated nausea and vomiting. Vertigo has been present for 6 months. Typically having episodes daily. Vertigo developed suddenly. The vertigo is positional, occurring while looking up, standing up, lying flat, and upon getting up. Episodes typically last for seconds. The symptoms are made worse by  movement. ENT performed bilateral cerumen debridement with microscopy on 12/08/2023. Hall pike was negative bilaterally by ENT. Planning for VNG testing and referred patient to vestibular rehab. Therapy ordered: Vestibular rehabilitation, Habituation exercises, and Substitution exercises. 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 (See chart for full details)  Per cardiology note on 11/17/2023: Patient has syncopal episodes ongoing over the past 1 to 2 years. She walks with a walker sometimes due to lower extremity weakness. Cardiac monitor placed last month showed occasional nonsustained SVT, no sustained arrhythmias. Echocardiogram 08/2023 showed normal EF. No significant structural abnormalities. Orthostatic vitals today with no evidence for orthostasis.    PERTINENT HISTORY: Arthritis, clotting disorder, GERD, IBS, L TKA, spastic bladder  Updated notes from ENT visit on 12/25/2023: RESULTS OF VNG: No nystagmus or subjective dizziness was observed during [facility]. No spontaneous nystagmus was observed. Smooth pursuit was abnormal during the left cycles only. Saccades were normal. Optokinetic testing was  abnormal in the faster speed to the right side in the left eye only. There was post-shake nystagmus, with four beats/second to the left side. Positional testing was abnormal, with eight beats/second of ageotropic, fatiguing, fixating nystagmus in body left and body right recordings. Calorics were abnormal, revealing a 38% left unilateral weakness.   IMPRESSION: Abnormal VNG. A central pathology cannot be ruled out based on today's abnormal test findings: abnormal smooth pursuit and OPKs. Post-headshake nystagmus is consistent with left weakness. Ageotropic, fatiguing nystagmus that fixates is consistent with peripheral pathology, however, was not present in repeat testing with VNG goggles off and eyes open. There was no indication of BPPV. 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  PAIN:  Are you having pain? No  PRECAUTIONS: Fall  RED FLAGS: None   WEIGHT BEARING RESTRICTIONS: No  FALLS: Has patient fallen in last 6 months? Yes. Number of falls 10+ with pt having a fall this morning  LIVING ENVIRONMENT: Lives with: lives with their family and lives with their spouse Lives in: House/apartment Stairs: need to ask Has following equipment at home: Vannie - 4 wheeled, which pt uses if she doesn't have her husband's support  PLOF: Independent with basic ADLs, Independent with household mobility without device, Independent with community mobility without device, and Independent with homemaking with ambulation  PATIENT GOALS: improve dizziness, decrease number of falls, improve balance  OBJECTIVE:  Note: Objective measures were completed at Evaluation unless otherwise noted.  DIAGNOSTIC FINDINGS:   EXAM: MRI HEAD WITHOUT AND WITH CONTRAST TECHNIQUE: Multiplanar, multiecho pulse sequences of the brain and surrounding structures were obtained without and with intravenous  contrast. CONTRAST:  10 mL of Vueway  IV COMPARISON:  CT head 05/30/2000 FINDINGS: Brain: No acute infarction, hemorrhage, hydrocephalus, extra-axial collection or mass lesion. No abnormal enhancement. Dedicated imaging of the IAC's was performed. The visualized seventh and eighth cranial nerves are unremarkable without abnormal enhancement or mass. Normal CSF signal within the labyrinth bilaterally. Vascular: Normal flow voids. Skull and upper cervical spine: Normal marrow signal. Sinuses/Orbits: Clear sinuses.  No acute orbital findings. IMPRESSION: No evidence of acute abnormality or IAC mass. Electronically Signed   By: Gilmore GORMAN Molt M.D.   On: 01/17/2024 11:21  EXAM: LUMBAR SPINE - COMPLETE 4+ VIEW COMPARISON:  MRI of lumbar spine December 06, 2022 FINDINGS: There is no evidence of lumbar spine fracture. Alignment is normal. Mild narrow intervertebral spaces and mild anterior spurring are identified in the upper lumbar spine. Bilateral hip replacements  are noted. Minimal facet joint sclerosis are noted in the mid to lower lumbar spine.   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  Electronically Signed   By: Craig Farr M.D.   On: 09/18/2023 14:52  COGNITION: Overall cognitive status: Within functional limits for tasks assessed   SENSATION: Not tested  EDEMA:  Not formally measured, but has pitting edema in bilateral lower legs (pt reports being on fluid pill) Pt states she usually wears compression stockings 2-3x/week (not wearing today)  MUSCLE TONE:  Not formally assessed  DTRs:  Achilles: need to assess  POSTURE:  rounded shoulders, forward head, and posterior pelvic tilt  Cervical ROM:    Active Not formally measured, but observation of movement noted A/PROM (deg) eval  Flexion   Extension Decreased from normal   Right lateral flexion   Left lateral flexion   Right rotation   Left rotation Decreased compared to R  (Blank  rows = not tested)  STRENGTH: Not formally assessed  LOWER EXTREMITY MMT:   MMT  Need to be assessed  Right  12/26/2023  Left  12/26/2023  Hip flexion 2- 2-  Hip abduction 4+ 4+  Hip adduction 4+ 4+  Hip internal rotation    Hip external rotation    Knee flexion 4+ 3+  Knee extension 4+ 4  Ankle dorsiflexion 4+ 4+  Ankle plantarflexion 4 4  Ankle inversion    Ankle eversion    (Blank rows = not tested)  *reports pain in R thigh/quad muscle and R anterior hip  Manual Muscle Test Scale 0/5 = No muscle contraction can be seen or felt 1/5 = Contraction can be felt, but there is no motion 2-/5 = Part moves through incomplete ROM w/ gravity decreased 2/5 = Part moves through complete ROM w/ gravity decreased 2+/5 = Part moves through incomplete ROM (<50%) against gravity or through complete ROM w/ gravity 3-/5 = Part moves through incomplete ROM (>50%) against gravity 3/5 = Part moves through complete ROM against gravity 3+/5 = Part moves through complete ROM against gravity/slight resistance 4-/5= Holds test position against slight to moderate pressure 4/5 = Part moves through complete ROM against gravity/moderate resistance 4+/5= Holds test position against moderate to strong pressure 5/5 = Part moves through complete ROM against gravity/full resistance  BED MOBILITY:  Sit to supine Modified independence Supine to sit Modified independence *Patient uses UEs to assist with LE management on/off the mat  TRANSFERS: Assistive device utilized: HHA  Sit to stand: CGA Stand to sit: CGA Chair to chair: CGA Floor: not assessed  RAMP: not assessed  CURB: not assessed  GAIT: Gait pattern: pt reports often feeling she veers towards the LEFT, decreased arm swing- Right, decreased arm swing- Left, decreased step length- Right, decreased step length- Left, and wide BOS Distance walked: ~162ft Assistive device utilized: L HHA vs none Level of assistance: CGA Comments:    FUNCTIONAL TESTS:   10 meter walk test: need to assess  Berg Balance Test: need to assess  Functional Gait Assessment: need to assess   PATIENT SURVEYS:   Dizziness Handicap Inventory [facility]): need to assess   VESTIBULAR ASSESSMENT:    SYMPTOM BEHAVIOR:  Subjective history: see above  Non-Vestibular symptoms: changes in hearing a few years ago, tinnitus, nausea but no vomiting, and hx of migraines years ago but never had to take medication for them  Type of dizziness: Spinning/Vertigo, Lightheadedness/Faint, and spinning when she stands up, states she has significant increased fatigue  Frequency: change positions (lying to sitting, but mostly sit to stand)  Duration: <63minute  Aggravating factors: Induced by position change: supine to sit and sit to stand  Relieving factors: slow movements and trying to stand before starting to walk  Progression of symptoms: slight increase/worsening of symptoms since February  OCULOMOTOR EXAM:  Ocular Alignment: normal  Ocular ROM: No Limitations  Spontaneous Nystagmus: absent  Gaze-Induced Nystagmus: absent  Smooth Pursuits: impaired with some difficulty tracking, especially when attempting to perform more circular smooth pursuits with pt reporting this causes her to feel dizzy  Saccades: hypometric/undershoots, hypermetric/overshoots, and slow  Convergence/Divergence: unable to converge eyes   VESTIBULAR - OCULAR REFLEX:   Slow VOR: Normal  VOR Cancellation: Corrective Saccades in both directions   Head-Impulse Test: HIT Right: positive HIT Left: positive  Dynamic Visual Acuity: Not able to be assessed   POSITIONAL TESTING:  Loaded Right Dix-Hallpike: no nystagmus, but positive for symptoms with delayed onset around ~30seconds and pt reporting this is her dizziness Loaded Left Dix-Hallpike: no nystagmus and negative for symptoms  Performed Canalith Repositioning: 1x on R side   MOTION SENSITIVITY:  Motion Sensitivity  Quotient Intensity: 0 = none, 1 = Lightheaded, 2 = Mild, 3 = Moderate, 4 = Severe, 5 = Vomiting  Intensity  1. Sitting to supine   2. Supine to L side   3. Supine to R side   4. Supine to sitting   5. L Hallpike-Dix   6. Up from L    7. R Hallpike-Dix   8. Up from R    9. Sitting, head tipped to L knee   10. Head up from L knee   11. Sitting, head tipped to R knee   12. Head up from R knee   13. Sitting head turns x5   14.Sitting head nods x5   15. In stance, 180 turn to L    16. In stance, 180 turn to R     OTHOSTATICS:  12/23/23:  116/64 68bpm supine (no symptoms)  118/61 67bpm seated (no symptoms)  116/65 71bpm standing (no symptoms)  114/68 70bpm standing x 60 sec (no symptoms)  (Negative) *also negative with cardiology                                                                                                                            TREATMENT DATE: 02/05/2024  Therapy session focused on re-assessment of standardized outcome measures and subjective questionnaire to determine pt's progress with therapy thus far.  Vitals  Sitting to start session: BP 118/68 (MAP 82), HR 66bpm  Standing: BP 119/68, HR 76bpm  *denies symptoms - pt states overall she has not felt lightheaded today  Therapist educated pt on recommendation to ensure she is having adequate water intake daily as well as eating whole foods to ensure more stable blood sugar.  Pt reports 0 falls in last month!  Unless otherwise stated, CGA/SBA was provided and gait belt donned  in order to ensure pt safety throughout session.  10 Meter Walk Test: Patient instructed to walk 10 meters (32.8 ft) as quickly and as safely as possible at their normal speed x2 and at a fast speed x2. Time measured from 2 meter mark to 8 meter mark to accommodate ramp-up and ramp-down.  Normal speed 1: 0.78 m/s (12.74 seconds) Normal speed 2: 0.848 m/s (11.79 seconds) Average Normal speed: 0.814 m/s, no AD, independently Fast  speed 1: 1.12 m/s (8.88 seconds) Fast speed 2: 1.01 m/s (9.82 seconds) Average Fast speed: 1.065 m/s, no AD, independently Cut off scores: <0.4 m/s = household Ambulator, 0.4-0.8 m/s = limited community Ambulator, >0.8 m/s = community Ambulator, >1.2 m/s = crossing a street, <1.0 = increased fall risk MCID 0.05 m/s (small), 0.13 m/s (moderate), 0.06 m/s (significant)  (ANPTA Core Set of Outcome Measures for Adults with Neurologic Conditions, 2018)   Patient participated in Danielson Balance Test and demonstrates increased fall risk as noted by score of  51/56.  (<36= high risk for falls, close to 100%; 37-45 significant >80%; 46-51 moderate >50%; 52-55 lower >25%).  Pt participated in Functional Gait Assessment (FGA) with score of 16/30 demonstrating high fall risk (low fall risk 25-28, medium fall risk 19-24, and high fall risk <19).   [facility] PT Assessment - 02/05/24 0001       Berg Balance Test   Sit to Stand Able to stand  independently using hands    Standing Unsupported Able to stand safely 2 minutes    Sitting with Back Unsupported but Feet Supported on Floor or Stool Able to sit safely and securely 2 minutes    Stand to Sit Sits safely with minimal use of hands    Transfers Able to transfer safely, minor use of hands    Standing Unsupported with Eyes Closed Able to stand 10 seconds safely    Standing Unsupported with Feet Together Able to place feet together independently and stand 1 minute safely    From Standing, Reach Forward with Outstretched Arm Can reach confidently >25 cm (10)    From Standing Position, Pick up Object from Floor Able to pick up shoe safely and easily    From Standing Position, Turn to Look Behind Over each Shoulder Looks behind from both sides and weight shifts well    Turn 360 Degrees Able to turn 360 degrees safely in 4 seconds or less    Standing Unsupported, Alternately Place Feet on Step/Stool Able to stand independently and safely and complete 8 steps in 20  seconds    Standing Unsupported, One Foot in Front Able to plae foot ahead of the other independently and hold 30 seconds    Standing on One Leg Tries to lift leg/unable to hold 3 seconds but remains standing independently    Total Score 51      Functional Gait  Assessment   Gait assessed  Yes    Gait Level Surface Walks 20 ft in less than 7 sec but greater than 5.5 sec, uses assistive device, slower speed, mild gait deviations, or deviates 6-10 in outside of the 12 in walkway width.   requires greater than 5.5 seconds   Change in Gait Speed Able to change speed, demonstrates mild gait deviations, deviates 6-10 in outside of the 12 in walkway width, or no gait deviations, unable to achieve a major change in velocity, or uses a change in velocity, or uses an assistive device.    Gait with Horizontal Head Turns  Performs head turns smoothly with slight change in gait velocity (eg, minor disruption to smooth gait path), deviates 6-10 in outside 12 in walkway width, or uses an assistive device.    Gait with Vertical Head Turns Performs task with slight change in gait velocity (eg, minor disruption to smooth gait path), deviates 6 - 10 in outside 12 in walkway width or uses assistive device   slight increased imbalance with upward head nods compared to downward or horizontal   Gait and Pivot Turn Pivot turns safely in greater than 3 sec and stops with no loss of balance, or pivot turns safely within 3 sec and stops with mild imbalance, requires small steps to catch balance.    Step Over Obstacle Is able to step over one shoe box (4.5 in total height) but must slow down and adjust steps to clear box safely. May require verbal cueing.   hip flexor weakness impacts pt's foot clearance over obstacle   Gait with Narrow Base of Support Ambulates less than 4 steps heel to toe or cannot perform without assistance.    Gait with Eyes Closed Walks 20 ft, uses assistive device, slower speed, mild gait deviations,  deviates 6-10 in outside 12 in walkway width. Ambulates 20 ft in less than 9 sec but greater than 7 sec.   deviates towards R   Ambulating Backwards Walks 20 ft, slow speed, abnormal gait pattern, evidence for imbalance, deviates 10-15 in outside 12 in walkway width.   slight posterior lean throughout   Steps Alternating feet, must use rail.   but only uses 1 HR   Total Score 16          THE DIZZINESS HANDICAP INVENTORY (DHI)  P1. Does looking up increase your problem? 2 = Sometimes  E2. Because of your problem, do you feel frustrated? 0 = No  F3. Because of your problem, do you restrict your travel for business or recreation?  0 = No  P4. Does walking down the aisle of a supermarket increase your problems?  0 = No  F5. Because of your problem, do you have difficulty getting into or out of bed?  0 = No  F6. Does your problem significantly restrict your participation in social activities, such as going out to dinner, going to the movies, dancing, or going to parties? 0 = No  F7. Because of your problem, do you have difficulty reading?  0 = No  P8. Does performing more ambitious activities such as sports, dancing, household chores (sweeping or putting dishes away) increase your problems?  0 = No  E9. Because of your problem, are you afraid to leave your home without having without having someone accompany you?  0 = No  E10. Because of your problem have you been embarrassed in front of others?  0 = No  P11. Do quick movements of your head increase your problem?  4 = Yes  F12. Because of your problem, do you avoid heights?  0 = No  P13. Does turning over in bed increase your problem?  0 = No  F14. Because of your problem, is it difficult for you to do strenuous homework or yard work? 4 = Yes - LBP with this requiring seated rest break  E15. Because of your problem, are you afraid people may think you are intoxicated? 0 = No  F16. Because of your problem, is it difficult for you to go for a walk  by yourself?  4 = Yes  P17.  Does walking down a sidewalk increase your problem?  0 = No  E18.Because of your problem, is it difficult for you to concentrate 0 = No  F19. Because of your problem, is it difficult for you to walk around your house in the dark? 0 = No  E20. Because of your problem, are you afraid to stay home alone?  0 = No  E21. Because of your problem, do you feel handicapped? 0 = No  E22. Has the problem placed stress on your relationships with members of your family or friends? 0 = No  E23. Because of your problem, are you depressed?  0 = No  F24. Does your problem interfere with your job or household responsibilities?  0 = No  P25. Does bending over increase your problem?  0 = No  TOTAL 14    DHI Scoring Instructions  The patient is asked to answer each question as it pertains to dizziness or unsteadiness problems, specifically  considering their condition during the last month. Questions are designed to incorporate functional (F), physical  (P), and emotional (E) impacts on disability.   Scores greater than 10 points should be referred to balance specialists for further evaluation.   16-34 Points (mild handicap)  36-52 Points (moderate handicap)  54+ Points (severe handicap)  Minimally Detectable Change: 17 points ([address] [place], 1990)  [place], G. SHAUNNA. and [place], C. W. (1990). The development of the Dizziness Handicap Inventory. Archives of Otolaryngology - Head and Neck Surgery 116(4): [id number].    Educated pt on results of assessment and her progress thus far and the primary impairments still noted.   PATIENT EDUCATION: Education details: Findings of therapy assessment, vestibular testing, vestibular treatment, plan for upcoming sessions Person educated: Patient Education method: Explanation and Handouts Education comprehension: verbalized understanding and needs further education  HOME EXERCISE PROGRAM:  Access Code: [email or web address] URL:  [email or web address] Date: 01/13/2024 Prepared by: Connell Kiss  Exercises - Seated Gaze Stabilization with Head Rotation  - 3 x daily - 3 sets - 30sec hold - Seated Gaze Stabilization with Head Nod  - 3 x daily - 3 sets - 30sec hold - Pencil Pushups  - 1 x daily - 7 x weekly - 3 sets - 10 reps - Seated VOR Cancellation  - 1 x daily - 7 x weekly - 3 sets - 10 reps - Sit to Stand with Arms Crossed  - 3 x daily - 1 sets - 10 reps - Standing March with Counter Support  - 1 x daily - 7 x weekly - 2 sets - 10-15 reps - Narrow Stance with Head Nods and Counter Support  - 1 x daily - 7 x weekly - 2 sets - 10 reps - Supine Heel Slide with Strap  - 2 x daily - 7 x weekly - 2 sets - 5 reps - Supine Bridge  - 1 x daily - 7 x weekly - 3 sets - 15 reps    GOALS: Goals reviewed with patient? Yes  SHORT TERM GOALS: Target date: 01/21/2024   Patient will be independent in home exercise program to improve gaze stabilization and/or mobility for improved functional independence with ADLs while experiencing decreased dizziness symptoms. Baseline: initiated on 12/23/2023 02/05/2024: last updated on 01/13/2024 Goal status: IN PROGRESS   LONG TERM GOALS: Target date: 03/03/2024  Patient will reduce dizziness handicap inventory [facility]) score to <36, for less dizziness with ADLs and increased safety with home and work tasks.  Baseline: 48% 02/05/2024: 14%  Goal status: IN PROGRESS  2.  Patient will deny any falls over past 4 weeks to demonstrate improved balance and safe independence with functional mobility.  Baseline: reports frequent falls with 1 on day of initial eval 02/05/2024: 0 falls in past 4 weeks Goal status: MET and CONTINUE  3.  Patient will increase Berg Balance score to > 51/56 to demonstrate improved balance and decreased fall risk during functional activities and ADLs.  Baseline: 01/15/2024: 49/56 02/05/2024: 51/56 Goal status: IN PROGRESS  4.  Patient will increase 10 meter  walk test to >1.58m/s using LRAD as to improve gait speed for better community ambulation and to reduce fall risk. Baseline: 0.45m/s  02/05/2024: Average Normal speed: 0.814 m/s & Average Fast speed: 1.065 m/s, no AD, independently  Goal status: IN PROGRESS  5.  Patient will increase Functional Gait Assessment (FGA) score to >20/30 as to reduce fall risk and improve dynamic gait safety with community ambulation.  Baseline: 15/30 02/05/2024: 16/30 Goal status: IN PROGRESS  ASSESSMENT:  CLINICAL IMPRESSION:   Patient is a 73 y.o. female who was seen today for physical therapy treatment focused on B LE functional strength deficits. Therapy session focused on re-assessment of standardized outcome measures and subjective questionnaire to determine pt's progress with therapy thus far. Patient reports significant improvement on DHI with pt reporting no longer experiencing dizziness/spinning sensation; however, pt reports she has recently started having lightheadedness episodes. Vitals checked and WNL during session today with no decrease in BP during transition from sitting to standing. Despite these episodes, pt reports no falls in the past month, which is a significant improvement for patient. Patient slight improvement on Berg, , and FGA all indicating improving standing balance, gait speed, and dynamic gait. Patient continues to have greatest impairments with tandem, SLS, obstacle navigation, backwards gait, and eyes closed. The pt will benefit from further skilled PT to improve positional and movement evoked dizziness and imbalance as well as B LE strength deficits in order to return pt to PLOF and increase QOL. Patient's condition has the potential to improve in response to therapy. Maximum improvement is yet to be obtained. The anticipated improvement is attainable and reasonable in a generally predictable time.      OBJECTIVE IMPAIRMENTS: Abnormal gait, cardiopulmonary status limiting activity,  decreased activity tolerance, decreased balance, decreased coordination, decreased endurance, decreased knowledge of condition, decreased knowledge of use of DME, decreased mobility, difficulty walking, decreased strength, and dizziness.   ACTIVITY LIMITATIONS: carrying, lifting, bending, standing, squatting, transfers, bed mobility, bathing, toileting, dressing, reach over head, hygiene/grooming, and locomotion level  PARTICIPATION LIMITATIONS: meal prep, cleaning, laundry, shopping, community activity, and yard work  PERSONAL FACTORS: Age, Time since onset of injury/illness/exacerbation, and 3+ comorbidities: Arthritis, clotting disorder, GERD, IBS, L TKA, spastic bladder are also affecting patient's functional outcome.   REHAB POTENTIAL: Good  CLINICAL DECISION MAKING: Evolving/moderate complexity  EVALUATION COMPLEXITY: Moderate   PLAN:  PT FREQUENCY: 1-2x/week  PT DURATION: 12 weeks  PLANNED INTERVENTIONS: 97164- PT Re-evaluation, 97750- Physical Performance Testing, 97110-Therapeutic exercises, 97530- Therapeutic activity, [id number]- Neuromuscular re-education, 97535- Self Care, [id number]- Manual therapy, [id number]- Gait training, [phone]- Orthotic Initial, [phone]- Orthotic/Prosthetic subsequent, [phone]- Canalith repositioning, Patient/Family education, Balance training, Stair training, Taping, Joint mobilization, Spinal mobilization, Vestibular training, Visual/preceptual remediation/compensation, DME instructions, Cryotherapy, Moist heat, and Biofeedback  PLAN FOR UPCOMING SESSIONS:  - B LE functional strengthening:  - gait on treadmill, progress to uphill (monitor vitals before and after) to address hip flexor weakness  - sidelying clamshells -  add to HEP if appropriate  - reciprocal stair navigation for strengthening  - standing foot taps to step - continue 3lb+ AW resistance as appropriate - dynamic balance & gait  - vertical head turns  - backwards gait   - obstacle navigation  - tandem  > SLS - perform positional vestibular testing with goggles if pt reports onset of positional vertigo symptoms again    Connell Kiss, PT, DPT, NCS, CSRS Physical Therapist - [facility]  [facility]  5:31 PM 02/05/24

## 2024-02-10 ENCOUNTER — Ambulatory Visit (INDEPENDENT_AMBULATORY_CARE_PROVIDER_SITE_OTHER)

## 2024-02-10 VITALS — BP 122/74 | Ht 70.0 in | Wt 198.0 lb

## 2024-02-10 DIAGNOSIS — Z0000 Encounter for general adult medical examination without abnormal findings: Secondary | ICD-10-CM

## 2024-02-10 NOTE — Progress Notes (Signed)
 Because this visit was a virtual/telehealth visit,  certain criteria was not obtained, such a blood pressure, CBG if applicable, and timed get up and go. Any medications not marked as taking were not mentioned during the medication reconciliation part of the visit. Any vitals not documented were not able to be obtained due to this being a telehealth visit or patient was unable to self-report a recent blood pressure reading due to a lack of equipment at home via telehealth. Vitals that have been documented are verbally provided by the patient.   This visit was performed by a medical professional under my direct supervision. I was immediately available for consultation/collaboration. I have reviewed and agree with the Annual Wellness Visit documentation.  Subjective:   Jennifer Frank is a 73 y.o. who presents for a Medicare Wellness preventive visit.  As a reminder, Annual Wellness Visits don't include a physical exam, and some assessments may be limited, especially if this visit is performed virtually. We may recommend an in-person follow-up visit with your provider if needed.  Visit Complete: Virtual I connected with  Jennifer Frank on 02/10/24 by a audio enabled telemedicine application and verified that I am speaking with the correct person using two identifiers.  Patient Location: Home  Provider Location: Home Office  I discussed the limitations of evaluation and management by telemedicine. The patient expressed understanding and agreed to proceed.  Vital Signs: Because this visit was a virtual/telehealth visit, some criteria may be missing or patient reported. Any vitals not documented were not able to be obtained and vitals that have been documented are patient reported.  VideoDeclined- This patient declined Librarian, academic. Therefore the visit was completed with audio only.  Persons Participating in Visit: Patient.  AWV Questionnaire: No: Patient  Medicare AWV questionnaire was not completed prior to this visit.  Cardiac Risk Factors include: advanced age (>36men, >28 women);dyslipidemia     Objective:    Today's Vitals   02/10/24 0853 02/10/24 0855  BP: 122/74   Weight: 198 lb (89.8 kg)   Height: 5' 10 (1.778 m)   PainSc:  5    Body mass index is 28.41 kg/m.     02/10/2024    8:54 AM 12/10/2023   11:59 AM 02/05/2023   11:46 AM 11/11/2022    8:56 AM 04/07/2022   11:14 AM 02/23/2020    2:52 PM 12/09/2017    2:01 PM  Advanced Directives  Does Patient Have a Medical Advance Directive? Yes Yes Yes Yes No Yes Yes   Type of Estate agent of [place];Living will Healthcare Power of [place];Living will Healthcare Power of [place];Living will Living will  Healthcare Power of [place];Living will Healthcare Power of [place];Living will  Does patient want to make changes to medical advance directive? No - Patient declined No - Patient declined       Copy of Healthcare Power of Attorney in Chart? No - copy requested No - copy requested    No - copy requested No - copy requested   Would patient like information on creating a medical advance directive?     No - Patient declined       Data saved with a previous flowsheet row definition    Current Medications (verified) Outpatient Encounter Medications as of 02/10/2024  Medication Sig   betamethasone  valerate (VALISONE ) 0.1 % cream Apply topically daily as needed.   Cholecalciferol (VITAMIN D3) 50 MCG (2000 UT) capsule Take 1 capsule (2,000 Units total) by mouth  daily.   furosemide  (LASIX ) 20 MG tablet TAKE 1 TABLET (20 MG TOTAL) BY MOUTH DAILY AS NEEDED FOR FLUID.   metoprolol  tartrate (LOPRESSOR ) 25 MG tablet TAKE 0.5-1 TABLETS (12.5-25 MG TOTAL) BY MOUTH 2 (TWO) TIMES DAILY AS NEEDED (FOR HEART RACING).   mirabegron  ER (MYRBETRIQ ) 25 MG TB24 tablet Take 1 tablet (25 mg total) by mouth daily.   pantoprazole  (PROTONIX ) 20 MG tablet Take 1 tablet (20 mg total) by  mouth daily.   predniSONE  (DELTASONE ) 20 MG tablet Take 2 a day for 5 days, then 1 a day for 5 days, with food. Don't take with aleve/ibuprofen.   traZODone  (DESYREL ) 50 MG tablet TAKE 0.5-1 TABLETS BY MOUTH AT BEDTIME AS NEEDED FOR SLEEP.   No facility-administered encounter medications on file as of 02/10/2024.    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   Fundic gland polyps of stomach, benign    GERD (gastroesophageal reflux disease)    past hx    Hearing loss    IBS (irritable bowel syndrome)    Personal history of colonic polyps - adenoma 09/22/2013   PONV (postoperative nausea and vomiting)    Reflux    Past Surgical History:  Procedure Laterality Date   HIP ARTHROSCOPY Left 1997   KNEE ARTHROSCOPY Left 1996   KNEE ARTHROSCOPY Right    05-2016 same time as left knee replacement    POLYPECTOMY     TONSILLECTOMY AND ADENOIDECTOMY  1957   TOTAL KNEE ARTHROPLASTY Bilateral 05/27/2016   Procedure: LEFT TOTAL KNEE ARTHROPLASTY WITH RIGHT KNEE ARTHROSCOPY;  Surgeon: Dempsey Sensor, MD;  Location: [facility] OR;  Service: Orthopedics;  Laterality: Bilateral;   UPPER GASTROINTESTINAL ENDOSCOPY     WISDOM TOOTH EXTRACTION     Family History  Problem Relation Age of Onset   Deep vein thrombosis Mother    Heart disease Mother        before age 5   Hyperlipidemia Mother    Hypertension Mother    Other Mother        varicose veins   Heart attack Mother    Peripheral vascular disease Mother    Bleeding Disorder Mother    Colon polyps Mother    Deep vein thrombosis Father    Heart disease Father        before age 66   Hyperlipidemia Father    Hypertension Father    Heart attack Father    Bleeding Disorder Father    AAA (abdominal aortic aneurysm) Father    Colon polyps  Father    Cancer Brother    Hyperlipidemia Brother    Colon cancer Brother 103       died 03/02/2008   Colon polyps Brother    Colon polyps Son    Crohn's disease Son    Breast cancer Paternal Aunt    Parkinson's disease Paternal Uncle    Esophageal cancer Neg Hx    Stomach cancer Neg Hx    Rectal cancer Neg Hx    Social History   Socioeconomic History   Marital status: Married    Spouse name: Not on file   Number of children: 2   Years of education: Not on file   Highest education level: Not on file  Occupational History   Occupation: Retired  Employer: RETIRED    Comment: [employer] insurance  Tobacco Use   Smoking status: Never   Smokeless tobacco: Never  Vaping Use   Vaping status: Never Used  Substance and Sexual Activity   Alcohol use: Not Currently    Alcohol/week: 1.0 standard drink of alcohol    Types: 1 Glasses of wine per week    Comment: rarely   Drug use: No   Sexual activity: Not Currently  Other Topics Concern   Not on file  Social History Narrative   Married since 1975   2 kids   Right handed   Social Drivers of Health   Financial Resource Strain: Low Risk  (02/10/2024)   Overall Financial Resource Strain (CARDIA)    Difficulty of Paying Living Expenses: Not hard at all  Food Insecurity: No Food Insecurity (02/10/2024)   Hunger Vital Sign    Worried About Running Out of Food in the Last Year: Never true    Ran Out of Food in the Last Year: Never true  Transportation Needs: No Transportation Needs (02/10/2024)   PRAPARE - Administrator, Civil Service (Medical): No    Lack of Transportation (Non-Medical): No  Physical Activity: Inactive (02/10/2024)   Exercise Vital Sign    Days of Exercise per Week: 0 days    Minutes of Exercise per Session: 0 min  Stress: No Stress Concern Present (02/10/2024)   [employer] of Occupational Health - Occupational Stress Questionnaire    Feeling of Stress: Not at all  Social Connections: Socially  Integrated (02/10/2024)   Social Connection and Isolation Panel    Frequency of Communication with Friends and Family: More than three times a week    Frequency of Social Gatherings with Friends and Family: More than three times a week    Attends Religious Services: More than 4 times per year    Active Member of [employer] or Organizations: Yes    Attends Engineer, structural: More than 4 times per year    Marital Status: Married    Tobacco Counseling Counseling given: Not Answered    Clinical Intake:  Pre-visit preparation completed: Yes  Pain : 0-10 Pain Score: 5  Pain Type: Chronic pain Pain Location: Knee Pain Orientation: Right Pain Descriptors / Indicators: Aching Pain Onset: Today Pain Frequency: Several days a week     BMI - recorded: 28.41 Nutritional Status: BMI 25 -29 Overweight Nutritional Risks: None Diabetes: No  Lab Results  Component Value Date   HGBA1C 5.7 11/11/2022   HGBA1C 5.9 04/22/2014     How often do you need to have someone help you when you read instructions, pamphlets, or other written materials from your doctor or pharmacy?: 1 - Never  Interpreter Needed?: No  Information entered by :: Darris Carachure,CMA   Activities of Daily Living     02/10/2024    8:58 AM  In your present state of health, do you have any difficulty performing the following activities:  Hearing? 0  Vision? 0  Difficulty concentrating or making decisions? 0  Walking or climbing stairs? 1  Dressing or bathing? 0  Doing errands, shopping? 0  Preparing Food and eating ? N  Using the Toilet? N  In the past six months, have you accidently leaked urine? Y  Do you have problems with loss of bowel control? N  Managing your Medications? N  Managing your Finances? N  Housekeeping or managing your Housekeeping? N    Patient Care Team: Cleatus Molly  S, MD as PCP - General (Family Medicine) Curlene Agent, MD as Consulting Physician (Obstetrics and  Gynecology)  I have updated your Care Teams any recent Medical Services you may have received from other providers in the past year.     Assessment:   This is a routine wellness examination for Nohemi.  Hearing/Vision screen Hearing Screening - Comments:: Patient has some difficulties , but no hearing aids  Vision Screening - Comments:: Patient wears glasses    Goals Addressed             This Visit's Progress    Patient Stated       To walk better        Depression Screen     02/10/2024    9:00 AM 09/11/2023   12:40 PM 07/08/2023    8:33 AM 03/11/2023   10:35 AM 02/05/2023   11:41 AM 09/05/2022   10:39 AM 05/25/2021    9:41 AM  PHQ 2/9 Scores  PHQ - 2 Score 0 0 4 0 0 0 0  PHQ- 9 Score 3 5 19 4  1      Fall Risk     02/10/2024    8:57 AM 09/11/2023   12:40 PM 07/08/2023    8:33 AM 03/11/2023   10:35 AM 02/05/2023   11:27 AM  Fall Risk   Falls in the past year? 1 1 1 1 1   Number falls in past yr: 1 1 1 1 1   Injury with Fall? 1 1 0 0 0  Risk for fall due to : History of fall(s);Impaired balance/gait;Orthopedic patient History of fall(s) History of fall(s) No Fall Risks;History of fall(s) History of fall(s);Impaired balance/gait  Follow up Falls evaluation completed;Education provided Falls evaluation completed Falls evaluation completed Falls evaluation completed Education provided;Falls prevention discussed    MEDICARE RISK AT HOME:  Medicare Risk at Home Any stairs in or around the home?: Yes If so, are there any without handrails?: No Home free of loose throw rugs in walkways, pet beds, electrical cords, etc?: Yes Adequate lighting in your home to reduce risk of falls?: Yes Life alert?: No Use of a cane, walker or w/c?: Yes Grab bars in the bathroom?: Yes Shower chair or bench in shower?: Yes Elevated toilet seat or a handicapped toilet?: Yes  TIMED UP AND GO:  Was the test performed?  No  Cognitive Function: 6CIT completed    02/23/2020    3:00 PM  12/09/2017    2:00 PM  MMSE - Mini Mental State Exam  Orientation to time 5 5  Orientation to Place 5 5  Registration 3 3  Attention/ Calculation 5 0  Recall 3 0  Recall-comments  unable to recall 3 of 3 words  Language- name 2 objects  0  Language- repeat 1 1  Language- follow 3 step command  3  Language- read & follow direction  0  Write a sentence  0  Copy design  0  Total score  17        02/10/2024    9:01 AM 02/05/2023   11:49 AM  6CIT Screen  What Year? 0 points 0 points  What month? 0 points 0 points  What time? 0 points 0 points  Count back from 20 0 points 0 points  Months in reverse 0 points 0 points  Repeat phrase 0 points 0 points  Total Score 0 points 0 points    Immunizations Immunization History  Administered Date(s) Administered   Fluad Quad(high  Dose 65+) 03/06/2020, 02/05/2021   Fluad Trivalent(High Dose 65+) 03/11/2023   Influenza Whole 02/21/2010, 02/21/2014   PFIZER(Purple Top)SARS-COV-2 Vaccination 06/08/2019, 06/27/2019, 03/23/2020   PPD Test 05/30/2016   Pneumococcal Conjugate-13 06/08/2019   Pneumococcal Polysaccharide-23 03/06/2020   Td 01/16/2009   Tdap 01/16/2009, 03/17/2014   Zoster Recombinant(Shingrix) 06/18/2023    Screening Tests Health Maintenance  Topic Date Due   Zoster Vaccines- Shingrix (2 of 2) 08/13/2023   Influenza Vaccine  12/19/2023   Medicare Annual Wellness (AWV)  02/05/2024   Mammogram  01/25/2024   Colonoscopy  04/08/2024   DTaP/Tdap/Td (4 - Td or Tdap) 03/17/2024   Pneumococcal Vaccine: 50+ Years  Completed   DEXA SCAN  Completed   Hepatitis C Screening  Completed   HPV VACCINES  Aged Out   Meningococcal B Vaccine  Aged Out   COVID-19 Vaccine  Discontinued    Health Maintenance Items Addressed:patient declined vaccination. Will get mammogram 09/29    Additional Screening:  Vision Screening: Recommended annual ophthalmology exams for early detection of glaucoma and other disorders of the eye. Is the  patient up to date with their annual eye exam?  No  Who is the provider or what is the name of the office in which the patient attends annual eye exams? 2  Dental Screening: Recommended annual dental exams for proper oral hygiene  Community Resource Referral / Chronic Care Management: CRR required this visit?  No   CCM required this visit?  No   Plan:    I have personally reviewed and noted the following in the patient's chart:   Medical and social history Use of alcohol, tobacco or illicit drugs  Current medications and supplements including opioid prescriptions. Patient is not currently taking opioid prescriptions. Functional ability and status Nutritional status Physical activity Advanced directives List of other physicians Hospitalizations, surgeries, and ER visits in previous 12 months Vitals Screenings to include cognitive, depression, and falls Referrals and appointments  In addition, I have reviewed and discussed with patient certain preventive protocols, quality metrics, and best practice recommendations. A written personalized care plan for preventive services as well as general preventive health recommendations were provided to patient.   Lyle MARLA Right, [place]   02/10/2024   After Visit Summary: (MyChart) Due to this being a telephonic visit, the after visit summary with patients personalized plan was offered to patient via MyChart   Notes: Nothing significant to report at this time.

## 2024-02-10 NOTE — Patient Instructions (Signed)
 [name],  Thank you for taking the time for your Medicare Wellness Visit. I appreciate your continued commitment to your health goals. Please review the care plan we discussed, and feel free to reach out if I can assist you further.  Medicare recommends these wellness visits once per year to help you and your care team stay ahead of potential health issues. These visits are designed to focus on prevention, allowing your provider to concentrate on managing your acute and chronic conditions during your regular appointments.  Please note that Annual Wellness Visits do not include a physical exam. Some assessments may be limited, especially if the visit was conducted virtually. If needed, we may recommend a separate in-person follow-up with your provider.  Ongoing Care Seeing your primary care provider every 3 to 6 months helps us  monitor your health and provide consistent, personalized care.   Referrals If a referral was made during today's visit and you haven't received any updates within two weeks, please contact the referred provider directly to check on the status.  Recommended Screenings:  Health Maintenance  Topic Date Due   Zoster (Shingles) Vaccine (2 of 2) 08/13/2023   Flu Shot  12/19/2023   Medicare Annual Wellness Visit  02/05/2024   Breast Cancer Screening  01/25/2024   Colon Cancer Screening  04/08/2024   DTaP/Tdap/Td vaccine (4 - Td or Tdap) 03/17/2024   Pneumococcal Vaccine for age over 39  Completed   DEXA scan (bone density measurement)  Completed   Hepatitis C Screening  Completed   HPV Vaccine  Aged Out   Meningitis B Vaccine  Aged Out   COVID-19 Vaccine  Discontinued       02/10/2024    8:54 AM  Advanced Directives  Does Patient Have a Medical Advance Directive? Yes  Type of Estate agent of [place];Living will  Does patient want to make changes to medical advance directive? No - Patient declined  Copy of Healthcare Power of Attorney in  Chart? No - copy requested   Advance Care Planning is important because it: Ensures you receive medical care that aligns with your values, goals, and preferences. Provides guidance to your family and loved ones, reducing the emotional burden of decision-making during critical moments.  Vision: Annual vision screenings are recommended for early detection of glaucoma, cataracts, and diabetic retinopathy. These exams can also reveal signs of chronic conditions such as diabetes and high blood pressure.  Dental: Annual dental screenings help detect early signs of oral cancer, gum disease, and other conditions linked to overall health, including heart disease and diabetes.  Please see the attached documents for additional preventive care recommendations.

## 2024-02-12 ENCOUNTER — Ambulatory Visit: Admitting: Physical Therapy

## 2024-02-12 DIAGNOSIS — M6281 Muscle weakness (generalized): Secondary | ICD-10-CM | POA: Diagnosis not present

## 2024-02-12 DIAGNOSIS — R2681 Unsteadiness on feet: Secondary | ICD-10-CM | POA: Diagnosis not present

## 2024-02-12 DIAGNOSIS — R29898 Other symptoms and signs involving the musculoskeletal system: Secondary | ICD-10-CM

## 2024-02-12 DIAGNOSIS — R296 Repeated falls: Secondary | ICD-10-CM | POA: Diagnosis not present

## 2024-02-12 DIAGNOSIS — R42 Dizziness and giddiness: Secondary | ICD-10-CM | POA: Diagnosis not present

## 2024-02-12 NOTE — Therapy (Signed)
 OUTPATIENT PHYSICAL THERAPY VESTIBULAR TREATMENT    Patient Name: Jennifer Frank MRN: [id number] DOB:1950/12/28, 73 y.o., female Today's Date: 02/12/2024  END OF SESSION:    PT End of Session - 02/12/24 1131     Visit Number 11    Number of Visits 24    Date for Recertification  03/03/24    Authorization Type [employer] Medicare    Progress Note Due on Visit 20    PT Start Time 1145    PT Stop Time 1230    PT Time Calculation (min) 45 min    Equipment Utilized During Treatment Gait belt    Activity Tolerance Patient tolerated treatment well;No increased pain    Behavior During Therapy WFL for tasks assessed/performed            Past Medical History:  Diagnosis Date   Arthritis    Clotting disorder    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   Fundic gland polyps of stomach, benign    GERD (gastroesophageal reflux disease)    past hx    Hearing loss    IBS (irritable bowel syndrome)    Personal history of colonic polyps - adenoma 09/22/2013   PONV (postoperative nausea and vomiting)    Reflux    Past Surgical History:  Procedure Laterality Date   HIP ARTHROSCOPY Left 1997   KNEE ARTHROSCOPY Left 1996   KNEE ARTHROSCOPY Right    05-2016 same time as left knee replacement    POLYPECTOMY     TONSILLECTOMY AND ADENOIDECTOMY  1957   TOTAL KNEE ARTHROPLASTY Bilateral 05/27/2016   Procedure: LEFT TOTAL KNEE ARTHROPLASTY WITH RIGHT KNEE ARTHROSCOPY;  Surgeon: Dempsey Sensor, MD;  Location: [facility] OR;  Service: Orthopedics;  Laterality: Bilateral;   UPPER GASTROINTESTINAL ENDOSCOPY     WISDOM TOOTH EXTRACTION     Patient Active Problem List   Diagnosis Date Noted   Syncope 09/14/2023   Other social stressor 07/09/2023   Insomnia 07/09/2023   Back pain 03/12/2023   Skin irritation 03/12/2023   Tremor 09/08/2022   Osteopenia 02/11/2022   SOB (shortness of breath) 05/27/2021   Aortic atherosclerosis 03/12/2020   Medicare annual wellness visit, subsequent 01/10/2019    Advance care planning 12/14/2017   Urinary incontinence 12/14/2017   Primary osteoarthritis of left knee 05/26/2016   Health care maintenance 04/22/2016   Hearing loss 04/22/2014   Varicose veins of bilateral lower extremities with other complications 01/25/2013   Edema 01/25/2013   Hyperlipidemia 02/16/2007   GERD 02/16/2007    PCP: [name], MD REFERRING PROVIDER: [name], MD   REFERRING DIAG: R42 (ICD-10-CM) - Dizziness   THERAPY DIAG:  Muscle weakness (generalized)  Dizziness and giddiness  Unsteadiness on feet  Repeated falls  Weakness of lower extremity, unspecified laterality  ONSET DATE: Around February 2025  Rationale for Evaluation and Treatment: Rehabilitation  SUBJECTIVE:   SUBJECTIVE STATEMENT:  Pt states her and her husband pulled some of their exercise equipment out to be more intentional with consistently exercising.  Pt states she tried their stationary bike; however, it is not very secure as far as balance goes and she was having a hard time keeping her feet on the pedals so she isn't doing it. Pt states she did do their rowing machine and did well, achieving ~20 repetitions.  Pt is wearing her thigh high support stockings today. Pt reports she feels like she has more control of her legs when wearing her stockings.   Pt states she  has also been trying to do her steps at home for exercise and it has been going well with reciprocal pattern on ascent.   Denies lightheadedness episodes since last therapy session.    Pt reports she would like to continue focusing on B LE functional strength now that her vestibular symptoms are improved.   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    From Initial Eval: Pt states 2 weeks ago her PCP, Dr. Cleatus, referred her to cardiology and they did an EKG showing normal sinus rhythm. States she did wear  heart monitor for 7 days with potential abnormal rhythm at night of tachycardia. Pt states cardiologist felt she had vertigo and referred her to ENT. Pt states a few years ago she was told she had loss of hearing. Pt states she has learned to lip read really well since then while she saves up money to afford hearing aides. States she visited ENT and had debridement of her ears and they confirmed she needs hearing aides. Pt states ENT didn't see vertigo and referred her to vestibular rehab.   Pt states she has frequent falls and fell this morning when getting up from seated position with L LOB. States she often falls to her LEFT, but sometimes face forward. States when she stands up she has to stand for a little while because if she moves too quick everything is revolving/spinning and she gets nauseous.   Pt states last year or beginning of this year she had EMG and MRI, then was referred to PT for strength training. Pt states imaging found lumbar spine has bulging discs and that the physical therapist at the time discontinued care due to concern for exacerbating her lumbar spine symptoms. Pt states she did her exercises for a little while, but then gradually stopped doing them.  Pt states she uses rollator when her husband is not with her for security and to have seat in tow. Pt states she has sudden onset anxious feeling with no known provocation and then she starts pushing AD too far out in front of her causing instability.   Pt says she has tremors in R hand and she was assessed for Parkinson's due to having family hx on her dad's side. Pt states she saw Neurologist last year and they said she didn't have Parkinson's. Pt also reports having quivering in R LE.  Pt reports this morning she had some numbness down her R UE down to her middle finger, but it is gone now.  Pt states if she stands with both of her hands up over her head she will blackout and pass out because she was doing a task overhead  years ago and this happened. States she can raise 1 arm overhead and she is OK.  Pt accompanied by: self, husband in waiting room [name])  Per ENT note on July 21st, 2025: Patient was referred to ENT by Arlyss Cleatus. Pt having vertigo described as dizziness, imbalance, light-headedness, and nausea. Pt has associated nausea and vomiting. Vertigo has been present for 6 months. Typically having episodes daily. Vertigo developed suddenly. The vertigo is positional, occurring while looking up, standing up, lying flat, and upon getting up. Episodes typically last for seconds. The symptoms are made worse by movement. ENT performed bilateral cerumen debridement with microscopy on 12/08/2023. Hall pike was negative bilaterally by ENT. Planning for VNG testing and referred patient  to vestibular rehab. Therapy ordered: Vestibular rehabilitation, Habituation exercises, and Substitution exercises. 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 (See chart for full details)  Per cardiology note on 11/17/2023: Patient has syncopal episodes ongoing over the past 1 to 2 years. She walks with a walker sometimes due to lower extremity weakness. Cardiac monitor placed last month showed occasional nonsustained SVT, no sustained arrhythmias. Echocardiogram 08/2023 showed normal EF. No significant structural abnormalities. Orthostatic vitals today with no evidence for orthostasis.    PERTINENT HISTORY: Arthritis, clotting disorder, GERD, IBS, L TKA, spastic bladder  Updated notes from ENT visit on 12/25/2023: RESULTS OF VNG: No nystagmus or subjective dizziness was observed during [facility]. No spontaneous nystagmus was observed. Smooth pursuit was abnormal during the left cycles only. Saccades were normal. Optokinetic testing was abnormal in the faster speed to the right side in the left eye only. There was post-shake nystagmus, with four beats/second to the left side. Positional testing was abnormal,  with eight beats/second of ageotropic, fatiguing, fixating nystagmus in body left and body right recordings. Calorics were abnormal, revealing a 38% left unilateral weakness.   IMPRESSION: Abnormal VNG. A central pathology cannot be ruled out based on today's abnormal test findings: abnormal smooth pursuit and OPKs. Post-headshake nystagmus is consistent with left weakness. Ageotropic, fatiguing nystagmus that fixates is consistent with peripheral pathology, however, was not present in repeat testing with VNG goggles off and eyes open. There was no indication of BPPV. 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  PAIN:  Are you having pain? No  PRECAUTIONS: Fall  RED FLAGS: None   WEIGHT BEARING RESTRICTIONS: No  FALLS: Has patient fallen in last 6 months? Yes. Number of falls 10+ with pt having a fall this morning  LIVING ENVIRONMENT: Lives with: lives with their family and lives with their spouse Lives in: House/apartment Stairs: need to ask Has following equipment at home: Vannie - 4 wheeled, which pt uses if she doesn't have her husband's support  PLOF: Independent with basic ADLs, Independent with household mobility without device, Independent with community mobility without device, and Independent with homemaking with ambulation  PATIENT GOALS: improve dizziness, decrease number of falls, improve balance  OBJECTIVE:  Note: Objective measures were completed at Evaluation unless otherwise noted.  DIAGNOSTIC FINDINGS:   EXAM: MRI HEAD WITHOUT AND WITH CONTRAST TECHNIQUE: Multiplanar, multiecho pulse sequences of the brain and surrounding structures were obtained without and with intravenous contrast. CONTRAST:  10 mL of Vueway  IV COMPARISON:  CT head 05/30/2000 FINDINGS: Brain: No acute infarction, hemorrhage, hydrocephalus, extra-axial collection or mass lesion. No abnormal  enhancement. Dedicated imaging of the IAC's was performed. The visualized seventh and eighth cranial nerves are unremarkable without abnormal enhancement or mass. Normal CSF signal within the labyrinth bilaterally. Vascular: Normal flow voids. Skull and upper cervical spine: Normal marrow signal. Sinuses/Orbits: Clear sinuses.  No acute orbital findings. IMPRESSION: No evidence of acute abnormality or IAC mass. Electronically Signed   By: Gilmore GORMAN Molt M.D.   On: 01/17/2024 11:21  EXAM: LUMBAR SPINE - COMPLETE 4+ VIEW COMPARISON:  MRI of lumbar spine December 06, 2022 FINDINGS: There is no evidence of lumbar spine fracture. Alignment is normal. Mild narrow intervertebral spaces and mild anterior spurring are identified in the upper lumbar spine. Bilateral hip replacements are noted. Minimal facet joint sclerosis are noted in the mid to lower lumbar spine.   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  Electronically Signed   By: Craig Farr M.D.   On: 09/18/2023 14:52  COGNITION: Overall cognitive status: Within functional limits for tasks assessed   SENSATION: Not tested  EDEMA:  Not formally measured, but has pitting edema in bilateral lower legs (pt reports being on fluid pill) Pt states she usually wears compression stockings 2-3x/week (not wearing today)  MUSCLE TONE:  Not formally assessed  DTRs:  Achilles: need to assess  POSTURE:  rounded shoulders, forward head, and posterior pelvic tilt  Cervical ROM:    Active Not formally measured, but observation of movement noted A/PROM (deg) eval  Flexion   Extension Decreased from normal   Right lateral flexion   Left lateral flexion   Right rotation   Left rotation Decreased compared to R  (Blank rows = not tested)  STRENGTH: Not formally assessed  LOWER EXTREMITY MMT:   MMT  Need to be assessed  Right  12/26/2023  Left  12/26/2023  Hip flexion 2- 2-  Hip abduction 4+ 4+   Hip adduction 4+ 4+  Hip internal rotation    Hip external rotation    Knee flexion 4+ 3+  Knee extension 4+ 4  Ankle dorsiflexion 4+ 4+  Ankle plantarflexion 4 4  Ankle inversion    Ankle eversion    (Blank rows = not tested)  *reports pain in R thigh/quad muscle and R anterior hip  Manual Muscle Test Scale 0/5 = No muscle contraction can be seen or felt 1/5 = Contraction can be felt, but there is no motion 2-/5 = Part moves through incomplete ROM w/ gravity decreased 2/5 = Part moves through complete ROM w/ gravity decreased 2+/5 = Part moves through incomplete ROM (<50%) against gravity or through complete ROM w/ gravity 3-/5 = Part moves through incomplete ROM (>50%) against gravity 3/5 = Part moves through complete ROM against gravity 3+/5 = Part moves through complete ROM against gravity/slight resistance 4-/5= Holds test position against slight to moderate pressure 4/5 = Part moves through complete ROM against gravity/moderate resistance 4+/5= Holds test position against moderate to strong pressure 5/5 = Part moves through complete ROM against gravity/full resistance  BED MOBILITY:  Sit to supine Modified independence Supine to sit Modified independence *Patient uses UEs to assist with LE management on/off the mat  TRANSFERS: Assistive device utilized: HHA  Sit to stand: CGA Stand to sit: CGA Chair to chair: CGA Floor: not assessed  RAMP: not assessed  CURB: not assessed  GAIT: Gait pattern: pt reports often feeling she veers towards the LEFT, decreased arm swing- Right, decreased arm swing- Left, decreased step length- Right, decreased step length- Left, and wide BOS Distance walked: ~160ft Assistive device utilized: L HHA vs none Level of assistance: CGA Comments:   FUNCTIONAL TESTS:   10 meter walk test: need to assess  Berg Balance Test: need to assess  Functional Gait Assessment: need to assess   PATIENT SURVEYS:   Dizziness Handicap Inventory  [facility]): need to assess   VESTIBULAR ASSESSMENT:    SYMPTOM BEHAVIOR:  Subjective history: see above  Non-Vestibular symptoms: changes in hearing a few years ago, tinnitus, nausea but no vomiting, and hx of migraines years ago but never had to take medication for them  Type of dizziness: Spinning/Vertigo, Lightheadedness/Faint, and spinning when she stands up, states she has significant increased fatigue  Frequency: change positions (lying to sitting, but mostly sit to stand)  Duration: <47minute  Aggravating factors: Induced by position change: supine to sit  and sit to stand  Relieving factors: slow movements and trying to stand before starting to walk  Progression of symptoms: slight increase/worsening of symptoms since February  OCULOMOTOR EXAM:  Ocular Alignment: normal  Ocular ROM: No Limitations  Spontaneous Nystagmus: absent  Gaze-Induced Nystagmus: absent  Smooth Pursuits: impaired with some difficulty tracking, especially when attempting to perform more circular smooth pursuits with pt reporting this causes her to feel dizzy  Saccades: hypometric/undershoots, hypermetric/overshoots, and slow  Convergence/Divergence: unable to converge eyes   VESTIBULAR - OCULAR REFLEX:   Slow VOR: Normal  VOR Cancellation: Corrective Saccades in both directions   Head-Impulse Test: HIT Right: positive HIT Left: positive  Dynamic Visual Acuity: Not able to be assessed   POSITIONAL TESTING:  Loaded Right Dix-Hallpike: no nystagmus, but positive for symptoms with delayed onset around ~30seconds and pt reporting this is her dizziness Loaded Left Dix-Hallpike: no nystagmus and negative for symptoms  Performed Canalith Repositioning: 1x on R side   MOTION SENSITIVITY:    OTHOSTATICS:  12/23/23:  116/64 68bpm supine (no symptoms)  118/61 67bpm seated (no symptoms)  116/65 71bpm standing (no symptoms)  114/68 70bpm standing x 60 sec (no symptoms)  (Negative) *also negative with cardiology                                                                                                                             TREATMENT DATE: 02/12/2024  Unless otherwise stated, CGA/SBA was provided and gait belt donned in order to ensure pt safety throughout session.  Vitals to start session: BP 101/89 (MAP 94), HR 66bpm, SpO2 97%  Gait training on treadmill the following trials with B UE support as follows (standing break between each):  5 min, working up to 0.67mph within <1 minute, progressed to 2% incline at ~3 minutes (had to briefly decrease speed to 0. with incline, to regain balance), totaling 236ft Borg RPE: 14/20; HR 100bmp Therapist facilitating wt shifting onto stance limbs throughout Verbal/visual cuing for increasing step lengths (L more impaired) Pt tendency to have step-to pattern leading with R LE Poor foot clearance bilaterally (L more impaired) Landing on forefoot at initial contact, but with cuing, improved towards mid-foot 32min02 sec building up to 0.76mph within 1 minute, totaling 154ft - increased to 2% incline after & pt able to maintain 0.78mph gait speed HR 140bpm, Borg RPE 12/20 Continued to have poor L LE foot clearance with shuffled pattern; however, improving to more consistent slight reciprocal pattern  More consistent midfoot landing on initial contact with a few instances of partial heel strike  Stair navigation training ascending/descending 12 steps (6 height) using R UE support on each HR with SBA  Consistent reciprocal pattern on ascent Descending step-to pattern leading with L LE progressed to reciprocal pattern, did use B UE support on HRs for safety with reciprocal pattern Improved LE alignment overall indicating improving hip strength; however, L LE continues to be excessively internally rotated  B LE functional strengthening to improve gait, foot clearance on unlevel surfaces, and stair navigation as follows:  Attempted L sidelying  clamshells as R hip appears to be stronger than L; however, pt unsuccessful and unable to perform the movement despite attempts at cuing and repositioning - indicating significant weakness Transitioned to supine hooklying hip abduction RTB x10reps Increased to GTB x10reps - provided pt with GTB to use at home STS from mat table X10reps 2x 10reps with 5lb dumbbell at chest Cuing for increased anterior hip hinge rising and lowering, cuing for improved eccentric control Leg press machine 25lbs x12 40lbs x12 Requires max A to place feet up on the machine due to hip weakness to lift her legs Pt able to keep her feet from slipping off the foot plate during the exercise today! Pt continues to report enjoying this exercise  Educated pt to add dumbbell resistance to sit<>stands and added hooklying clamshells to HEP - printout of those pages provided to patient.   Pt states she is no longer performing her gaze stabilization exercsises. Will discuss further at upcoming visit on whether this is necessary to continue.    PATIENT EDUCATION: Education details: Findings of therapy assessment, vestibular testing, vestibular treatment, plan for upcoming sessions Person educated: Patient Education method: Explanation and Handouts Education comprehension: verbalized understanding and needs further education  HOME EXERCISE PROGRAM:  Access Code: [email or web address] URL: [email or web address] Date: 02/12/2024 Prepared by: Connell Kiss  Exercises - Seated Gaze Stabilization with Head Rotation  - 3 x daily - 3 sets - 30sec hold - Seated Gaze Stabilization with Head Nod  - 3 x daily - 3 sets - 30sec hold - Pencil Pushups  - 1 x daily - 7 x weekly - 3 sets - 10 reps - Seated VOR Cancellation  - 1 x daily - 7 x weekly - 3 sets - 10 reps - Standing March with Counter Support  - 1 x daily - 7 x weekly - 2 sets - 10-15 reps - Narrow Stance with Head Nods and Counter Support  - 1 x daily - 7 x weekly - 2  sets - 10 reps - Supine Heel Slide with Strap  - 2 x daily - 7 x weekly - 2 sets - 5 reps - Supine Bridge  - 1 x daily - 7 x weekly - 3 sets - 15 reps - Resisted Sit-to-Stand With Dumbbell at Chest  - 1 x daily - 3-5 x weekly - 2-3 sets - 10 reps - Hooklying Isometric Clamshell  - 1 x daily - 3-5 x weekly - 2-3 sets - 10 reps   GOALS: Goals reviewed with patient? Yes  SHORT TERM GOALS: Target date: 01/21/2024   Patient will be independent in home exercise program to improve gaze stabilization and/or mobility for improved functional independence with ADLs while experiencing decreased dizziness symptoms. Baseline: initiated on 12/23/2023 02/05/2024: last updated on 01/13/2024 Goal status: IN PROGRESS   LONG TERM GOALS: Target date: 03/03/2024  Patient will reduce dizziness handicap inventory [facility]) score to <36, for less dizziness with ADLs and increased safety with home and work tasks.  Baseline: 48% 02/05/2024: 14% Goal status: IN PROGRESS  2.  Patient will deny any falls over past 4 weeks to demonstrate improved balance and safe independence with functional mobility.  Baseline: reports frequent falls with 1 on day of initial eval 02/05/2024: 0 falls in past 4 weeks Goal status: MET and CONTINUE  3.  Patient will increase Berg Balance score to >  51/56 to demonstrate improved balance and decreased fall risk during functional activities and ADLs.  Baseline: 01/15/2024: 49/56 02/05/2024: 51/56 Goal status: IN PROGRESS  4.  Patient will increase 10 meter walk test to >1.17m/s using LRAD as to improve gait speed for better community ambulation and to reduce fall risk. Baseline: 0.40m/s  02/05/2024: Average Normal speed: 0.814 m/s & Average Fast speed: 1.065 m/s, no AD, independently  Goal status: IN PROGRESS  5.  Patient will increase Functional Gait Assessment (FGA) score to >20/30 as to reduce fall risk and improve dynamic gait safety with community ambulation.  Baseline: 15/30 02/05/2024:  16/30 Goal status: IN PROGRESS  ASSESSMENT:  CLINICAL IMPRESSION:   Patient is a 73 y.o. female who was seen today for physical therapy treatment focused on B LE functional strength deficits. Patient participated in gait training on treadmill again today to focus on increasing intensity of gait and progressed in gait speed as well as to 2% incline. Pt continues to demo decreased L LE swing phase advancement and decreased foot clearance bilaterally (L>R) but improved with increasing exposure to gait on treadmill and with increased verbal/visual cuing. Patient also demonstrates improving stair navigation technique achieving consistent reciprocal pattern on ascent with increased hip flexor strength allowing her to advance limbs with less internal rotation compensation; however, still more impaired on L LE. Patient also demonstrating increasing functional strength in B LEs with ability to progress to resisted sit<>stand training as well as increased resistance and improved form with leg press machine. Patient will benefit from continuation of B LE strengthening exercises targeting hip flexors, hip ERs, hip abductors, knee/hip extensors to improve functional transfers, gait, and stair navigation. Patient also continues to demonstrate dynamic gait impairments with tandem, SLS, obstacle navigation, backwards gait, and eyes closed. The pt will benefit from further skilled PT to improve positional and movement evoked dizziness and imbalance as well as B LE strength deficits in order to return pt to PLOF and increase QOL.   OBJECTIVE IMPAIRMENTS: Abnormal gait, cardiopulmonary status limiting activity, decreased activity tolerance, decreased balance, decreased coordination, decreased endurance, decreased knowledge of condition, decreased knowledge of use of DME, decreased mobility, difficulty walking, decreased strength, and dizziness.   ACTIVITY LIMITATIONS: carrying, lifting, bending, standing, squatting,  transfers, bed mobility, bathing, toileting, dressing, reach over head, hygiene/grooming, and locomotion level  PARTICIPATION LIMITATIONS: meal prep, cleaning, laundry, shopping, community activity, and yard work  PERSONAL FACTORS: Age, Time since onset of injury/illness/exacerbation, and 3+ comorbidities: Arthritis, clotting disorder, GERD, IBS, L TKA, spastic bladder are also affecting patient's functional outcome.   REHAB POTENTIAL: Good  CLINICAL DECISION MAKING: Evolving/moderate complexity  EVALUATION COMPLEXITY: Moderate   PLAN:  PT FREQUENCY: 1-2x/week  PT DURATION: 12 weeks  PLANNED INTERVENTIONS: [id number]- PT Re-evaluation, 97750- Physical Performance Testing, 97110-Therapeutic exercises, 97530- Therapeutic activity, [id number]- Neuromuscular re-education, 97535- Self Care, [id number]- Manual therapy, [id number]- Gait training, [phone]- Orthotic Initial, [id number]- Orthotic/Prosthetic subsequent, [phone]- Canalith repositioning, Patient/Family education, Balance training, Stair training, Taping, Joint mobilization, Spinal mobilization, Vestibular training, Visual/preceptual remediation/compensation, DME instructions, Cryotherapy, Moist heat, and Biofeedback  PLAN FOR UPCOMING SESSIONS:  - follow-up on HEP additions  - resisted sit<>stands & hooklying clamshells (GTB) - B LE functional strengthening:  - Nustep   - gait on treadmill, progress to uphill (monitor vitals before and after) to address hip flexor weakness  - reciprocal stair navigation for strengthening  - standing foot taps to step - continue 3lb+ AW resistance as appropriate  - leg press machine - dynamic balance &  gait  - vertical head turns  - backwards gait   - obstacle navigation  - tandem > SLS - perform positional vestibular testing with goggles if pt reports onset of positional vertigo symptoms again    Connell Kiss, PT, DPT, NCS, CSRS Physical Therapist - [facility]  [facility]  12:40 PM 02/12/24

## 2024-02-16 DIAGNOSIS — Z6831 Body mass index (BMI) 31.0-31.9, adult: Secondary | ICD-10-CM | POA: Diagnosis not present

## 2024-02-16 DIAGNOSIS — Z01419 Encounter for gynecological examination (general) (routine) without abnormal findings: Secondary | ICD-10-CM | POA: Diagnosis not present

## 2024-02-16 DIAGNOSIS — M8588 Other specified disorders of bone density and structure, other site: Secondary | ICD-10-CM | POA: Diagnosis not present

## 2024-02-16 DIAGNOSIS — Z1231 Encounter for screening mammogram for malignant neoplasm of breast: Secondary | ICD-10-CM | POA: Diagnosis not present

## 2024-02-17 ENCOUNTER — Ambulatory Visit: Admitting: Physical Therapy

## 2024-02-17 DIAGNOSIS — R296 Repeated falls: Secondary | ICD-10-CM

## 2024-02-17 DIAGNOSIS — R42 Dizziness and giddiness: Secondary | ICD-10-CM | POA: Diagnosis not present

## 2024-02-17 DIAGNOSIS — R29898 Other symptoms and signs involving the musculoskeletal system: Secondary | ICD-10-CM

## 2024-02-17 DIAGNOSIS — M6281 Muscle weakness (generalized): Secondary | ICD-10-CM

## 2024-02-17 DIAGNOSIS — R2681 Unsteadiness on feet: Secondary | ICD-10-CM

## 2024-02-17 NOTE — Therapy (Signed)
 OUTPATIENT PHYSICAL THERAPY VESTIBULAR TREATMENT    Patient Name: Jennifer Frank MRN: [id number] DOB:1951-03-19, 73 y.o., female Today's Date: 02/17/2024  END OF SESSION:    PT End of Session - 02/17/24 1151     Visit Number 12    Number of Visits 24    Date for Recertification  03/03/24    Authorization Type [employer] Medicare    Progress Note Due on Visit 20    PT Start Time 1150    PT Stop Time 1231    PT Time Calculation (min) 41 min    Equipment Utilized During Treatment Gait belt    Activity Tolerance Patient tolerated treatment well;No increased pain    Behavior During Therapy WFL for tasks assessed/performed             Past Medical History:  Diagnosis Date   Arthritis    Clotting disorder    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   Fundic gland polyps of stomach, benign    GERD (gastroesophageal reflux disease)    past hx    Hearing loss    IBS (irritable bowel syndrome)    Personal history of colonic polyps - adenoma 09/22/2013   PONV (postoperative nausea and vomiting)    Reflux    Past Surgical History:  Procedure Laterality Date   HIP ARTHROSCOPY Left 1997   KNEE ARTHROSCOPY Left 1996   KNEE ARTHROSCOPY Right    05-2016 same time as left knee replacement    POLYPECTOMY     TONSILLECTOMY AND ADENOIDECTOMY  1957   TOTAL KNEE ARTHROPLASTY Bilateral 05/27/2016   Procedure: LEFT TOTAL KNEE ARTHROPLASTY WITH RIGHT KNEE ARTHROSCOPY;  Surgeon: Dempsey Sensor, MD;  Location: [facility] OR;  Service: Orthopedics;  Laterality: Bilateral;   UPPER GASTROINTESTINAL ENDOSCOPY     WISDOM TOOTH EXTRACTION     Patient Active Problem List   Diagnosis Date Noted   Syncope 09/14/2023   Other social stressor 07/09/2023   Insomnia 07/09/2023   Back pain 03/12/2023   Skin irritation 03/12/2023   Tremor 09/08/2022   Osteopenia 02/11/2022   SOB (shortness of breath) 05/27/2021   Aortic atherosclerosis 03/12/2020   Medicare annual wellness visit, subsequent 01/10/2019    Advance care planning 12/14/2017   Urinary incontinence 12/14/2017   Primary osteoarthritis of left knee 05/26/2016   Health care maintenance 04/22/2016   Hearing loss 04/22/2014   Varicose veins of bilateral lower extremities with other complications 01/25/2013   Edema 01/25/2013   Hyperlipidemia 02/16/2007   GERD 02/16/2007    PCP: [name], MD REFERRING PROVIDER: [name], MD   REFERRING DIAG: R42 (ICD-10-CM) - Dizziness   THERAPY DIAG:  Muscle weakness (generalized)  Dizziness and giddiness  Unsteadiness on feet  Weakness of lower extremity, unspecified laterality  Repeated falls  ONSET DATE: Around February 2025  Rationale for Evaluation and Treatment: Rehabilitation  SUBJECTIVE:   SUBJECTIVE STATEMENT:  Pt reports her legs feel stiff this morning. Pt states her knees and ankles were real swollen over the weekend. Pt states she has still been wearing her thigh high stockings, but she didn't wear them today. (Pt continues to have noticeable edema in B LEs - educated pt to don compression stockings again and continue wear as recommended by her MD). Pt states she felt good and real positive after last session even though she was sore. Pt states the soreness was not as much as she was anticipating the next morning. Pt states she has not yet tried to  add resistance to STSs, but she did do the hooklying clamshells and liked it and felt muscle activation in quads and lateral hips. Pt states she has still been using their personal rowing machine 2x/day for ~25 repetitions.  Denies dizziness or lightheadedness since last therapy session. Pt states she hasn't had diet doctor pepper to drink in ~1.5 weeks. Discussed options of sparkling water as alternative to include flavor.  Pt conitnues to report some difficulty coming to stand from chair without using UE support  Pt reports she would like to continue focusing on B LE functional strength now that her  vestibular symptoms are improved.   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    From Initial Eval: Pt states 2 weeks ago her PCP, Dr. Cleatus, referred her to cardiology and they did an EKG showing normal sinus rhythm. States she did wear heart monitor for 7 days with potential abnormal rhythm at night of tachycardia. Pt states cardiologist felt she had vertigo and referred her to ENT. Pt states a few years ago she was told she had loss of hearing. Pt states she has learned to lip read really well since then while she saves up money to afford hearing aides. States she visited ENT and had debridement of her ears and they confirmed she needs hearing aides. Pt states ENT didn't see vertigo and referred her to vestibular rehab.   Pt states she has frequent falls and fell this morning when getting up from seated position with L LOB. States she often falls to her LEFT, but sometimes face forward. States when she stands up she has to stand for a little while because if she moves too quick everything is revolving/spinning and she gets nauseous.   Pt states last year or beginning of this year she had EMG and MRI, then was referred to PT for strength training. Pt states imaging found lumbar spine has bulging discs and that the physical therapist at the time discontinued care due to concern for exacerbating her lumbar spine symptoms. Pt states she did her exercises for a little while, but then gradually stopped doing them.  Pt states she uses rollator when her husband is not with her for security and to have seat in tow. Pt states she has sudden onset anxious feeling with no known provocation and then she starts pushing AD too far out in front of her causing instability.   Pt says she has tremors in R hand and she was assessed for Parkinson's due to having family hx on her dad's side. Pt states she saw  Neurologist last year and they said she didn't have Parkinson's. Pt also reports having quivering in R LE.  Pt reports this morning she had some numbness down her R UE down to her middle finger, but it is gone now.  Pt states if she stands with both of her hands up over her head she will blackout and pass out because she was doing a task overhead years ago and this happened. States she can raise 1 arm overhead and she is OK.  Pt accompanied by: self, husband in waiting room [name])  Per ENT note on July 21st, 2025: Patient was referred to ENT by Arlyss Cleatus. Pt having vertigo described as dizziness, imbalance, light-headedness, and nausea. Pt has associated nausea and vomiting. Vertigo has been present for 6 months. Typically having episodes daily. Vertigo developed  suddenly. The vertigo is positional, occurring while looking up, standing up, lying flat, and upon getting up. Episodes typically last for seconds. The symptoms are made worse by movement. ENT performed bilateral cerumen debridement with microscopy on 12/08/2023. Hall pike was negative bilaterally by ENT. Planning for VNG testing and referred patient to vestibular rehab. Therapy ordered: Vestibular rehabilitation, Habituation exercises, and Substitution exercises. 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 (See chart for full details)  Per cardiology note on 11/17/2023: Patient has syncopal episodes ongoing over the past 1 to 2 years. She walks with a walker sometimes due to lower extremity weakness. Cardiac monitor placed last month showed occasional nonsustained SVT, no sustained arrhythmias. Echocardiogram 08/2023 showed normal EF. No significant structural abnormalities. Orthostatic vitals today with no evidence for orthostasis.    PERTINENT HISTORY: Arthritis, clotting disorder, GERD, IBS, L TKA, spastic bladder  Updated notes from ENT visit on 12/25/2023: RESULTS OF VNG: No nystagmus or subjective  dizziness was observed during [facility]. No spontaneous nystagmus was observed. Smooth pursuit was abnormal during the left cycles only. Saccades were normal. Optokinetic testing was abnormal in the faster speed to the right side in the left eye only. There was post-shake nystagmus, with four beats/second to the left side. Positional testing was abnormal, with eight beats/second of ageotropic, fatiguing, fixating nystagmus in body left and body right recordings. Calorics were abnormal, revealing a 38% left unilateral weakness.   IMPRESSION: Abnormal VNG. A central pathology cannot be ruled out based on today's abnormal test findings: abnormal smooth pursuit and OPKs. Post-headshake nystagmus is consistent with left weakness. Ageotropic, fatiguing nystagmus that fixates is consistent with peripheral pathology, however, was not present in repeat testing with VNG goggles off and eyes open. There was no indication of BPPV. 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  PAIN:  Are you having pain? No  PRECAUTIONS: Fall  RED FLAGS: None   WEIGHT BEARING RESTRICTIONS: No  FALLS: Has patient fallen in last 6 months? Yes. Number of falls 10+ with pt having a fall this morning  LIVING ENVIRONMENT: Lives with: lives with their family and lives with their spouse Lives in: House/apartment Stairs: need to ask Has following equipment at home: Vannie - 4 wheeled, which pt uses if she doesn't have her husband's support  PLOF: Independent with basic ADLs, Independent with household mobility without device, Independent with community mobility without device, and Independent with homemaking with ambulation  PATIENT GOALS: improve dizziness, decrease number of falls, improve balance  OBJECTIVE:  Note: Objective measures were completed at Evaluation unless otherwise noted.  DIAGNOSTIC FINDINGS:   EXAM: MRI  HEAD WITHOUT AND WITH CONTRAST TECHNIQUE: Multiplanar, multiecho pulse sequences of the brain and surrounding structures were obtained without and with intravenous contrast. CONTRAST:  10 mL of Vueway  IV COMPARISON:  CT head 05/30/2000 FINDINGS: Brain: No acute infarction, hemorrhage, hydrocephalus, extra-axial collection or mass lesion. No abnormal enhancement. Dedicated imaging of the IAC's was performed. The visualized seventh and eighth cranial nerves are unremarkable without abnormal enhancement or mass. Normal CSF signal within the labyrinth bilaterally. Vascular: Normal flow voids. Skull and upper cervical spine: Normal marrow signal. Sinuses/Orbits: Clear sinuses.  No acute orbital findings. IMPRESSION: No evidence of acute abnormality or IAC mass. Electronically Signed   By: Gilmore GORMAN Molt M.D.   On: 01/17/2024 11:21  EXAM: LUMBAR SPINE - COMPLETE 4+ VIEW COMPARISON:  MRI of lumbar spine December 06, 2022 FINDINGS: There  is no evidence of lumbar spine fracture. Alignment is normal. Mild narrow intervertebral spaces and mild anterior spurring are identified in the upper lumbar spine. Bilateral hip replacements are noted. Minimal facet joint sclerosis are noted in the mid to lower lumbar spine.   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  Electronically Signed   By: Craig Farr M.D.   On: 09/18/2023 14:52  COGNITION: Overall cognitive status: Within functional limits for tasks assessed   SENSATION: Not tested  EDEMA:  Not formally measured, but has pitting edema in bilateral lower legs (pt reports being on fluid pill) Pt states she usually wears compression stockings 2-3x/week (not wearing today)  MUSCLE TONE:  Not formally assessed  DTRs:  Achilles: need to assess  POSTURE:  rounded shoulders, forward head, and posterior pelvic tilt  Cervical ROM:    Active Not formally measured, but observation of movement noted A/PROM  (deg) eval  Flexion   Extension Decreased from normal   Right lateral flexion   Left lateral flexion   Right rotation   Left rotation Decreased compared to R  (Blank rows = not tested)  STRENGTH: Not formally assessed  LOWER EXTREMITY MMT:   MMT  Need to be assessed  Right  12/26/2023  Left  12/26/2023  Hip flexion 2- 2-  Hip abduction 4+ 4+  Hip adduction 4+ 4+  Hip internal rotation    Hip external rotation    Knee flexion 4+ 3+  Knee extension 4+ 4  Ankle dorsiflexion 4+ 4+  Ankle plantarflexion 4 4  Ankle inversion    Ankle eversion    (Blank rows = not tested)  *reports pain in R thigh/quad muscle and R anterior hip  Manual Muscle Test Scale 0/5 = No muscle contraction can be seen or felt 1/5 = Contraction can be felt, but there is no motion 2-/5 = Part moves through incomplete ROM w/ gravity decreased 2/5 = Part moves through complete ROM w/ gravity decreased 2+/5 = Part moves through incomplete ROM (<50%) against gravity or through complete ROM w/ gravity 3-/5 = Part moves through incomplete ROM (>50%) against gravity 3/5 = Part moves through complete ROM against gravity 3+/5 = Part moves through complete ROM against gravity/slight resistance 4-/5= Holds test position against slight to moderate pressure 4/5 = Part moves through complete ROM against gravity/moderate resistance 4+/5= Holds test position against moderate to strong pressure 5/5 = Part moves through complete ROM against gravity/full resistance  BED MOBILITY:  Sit to supine Modified independence Supine to sit Modified independence *Patient uses UEs to assist with LE management on/off the mat  TRANSFERS: Assistive device utilized: HHA  Sit to stand: CGA Stand to sit: CGA Chair to chair: CGA Floor: not assessed  RAMP: not assessed  CURB: not assessed  GAIT: Gait pattern: pt reports often feeling she veers towards the LEFT, decreased arm swing- Right, decreased arm swing- Left, decreased  step length- Right, decreased step length- Left, and wide BOS Distance walked: ~176ft Assistive device utilized: L HHA vs none Level of assistance: CGA Comments:   FUNCTIONAL TESTS:   10 meter walk test: need to assess  Berg Balance Test: need to assess  Functional Gait Assessment: need to assess   PATIENT SURVEYS:   Dizziness Handicap Inventory [facility]): need to assess   VESTIBULAR ASSESSMENT:    SYMPTOM BEHAVIOR:  Subjective history: see above  Non-Vestibular symptoms: changes in hearing a few years ago, tinnitus, nausea but no vomiting, and hx of migraines years  ago but never had to take medication for them  Type of dizziness: Spinning/Vertigo, Lightheadedness/Faint, and spinning when she stands up, states she has significant increased fatigue  Frequency: change positions (lying to sitting, but mostly sit to stand)  Duration: <31minute  Aggravating factors: Induced by position change: supine to sit and sit to stand  Relieving factors: slow movements and trying to stand before starting to walk  Progression of symptoms: slight increase/worsening of symptoms since February  OCULOMOTOR EXAM:  Ocular Alignment: normal  Ocular ROM: No Limitations  Spontaneous Nystagmus: absent  Gaze-Induced Nystagmus: absent  Smooth Pursuits: impaired with some difficulty tracking, especially when attempting to perform more circular smooth pursuits with pt reporting this causes her to feel dizzy  Saccades: hypometric/undershoots, hypermetric/overshoots, and slow  Convergence/Divergence: unable to converge eyes   VESTIBULAR - OCULAR REFLEX:   Slow VOR: Normal  VOR Cancellation: Corrective Saccades in both directions   Head-Impulse Test: HIT Right: positive HIT Left: positive  Dynamic Visual Acuity: Not able to be assessed   POSITIONAL TESTING:  Loaded Right Dix-Hallpike: no nystagmus, but positive for symptoms with delayed onset around ~30seconds and pt reporting this is her dizziness Loaded  Left Dix-Hallpike: no nystagmus and negative for symptoms  Performed Canalith Repositioning: 1x on R side   MOTION SENSITIVITY:    OTHOSTATICS:  12/23/23:  116/64 68bpm supine (no symptoms)  118/61 67bpm seated (no symptoms)  116/65 71bpm standing (no symptoms)  114/68 70bpm standing x 60 sec (no symptoms)  (Negative) *also negative with cardiology                                                                                                                            TREATMENT DATE: 02/17/2024  B UE and B LE reciprocal movement pattern on Nustep for cardiovascular training and B LE functional strengthening against the following levels:  Level 1 resistance for 1 minute Level 2 for 2 minutes Level 3 for 2 minutes Totaling 5 minutes and 290 steps Goal steps per minute (SPM): >60  *Cuing to maintain SPM goal and cuing to focus on use of B LEs to perform the movements rather than B UEs to target strengthening  Pt reports B LEs are feeling less stiff following Nustep.   Unless otherwise stated, CGA/SBA was provided and gait belt donned in order to ensure pt safety throughout session.   Stair navigation training ascending/descending 16 steps (6 height) using R UE support on each HR with SBA  Donned 3lb AWs to B LEs Consistent reciprocal pattern on ascent & descent (occasionally pt will revert back to step-to pattern on descent due to it being a habit per patient) Improved LE alignment overall indicating improving hip strength; however, L LE continues to be excessively internally rotated  Cuing to correct this as much as possible   B LE functional strengthening to improve gait, foot clearance on unlevel surfaces, and stair navigation as follows:  Standing hip abduction at balance bar  for B UE support 3lb AWs on each LE 2x10reps each LE Max cuing for proper form/technique and not to compensate with knee flexion STS from min elevated mat table 2x 10reps with 7lb dumbbell at  chest Cuing for increased anterior hip hinge rising and lowering, cuing for improved eccentric control Noticed slight R wt shift bias - cuing to bring L foot back underneath BOS more Standing foot taps to brown step next to balance bar for UE support as needed 3lb AWs on each LE 2x ~8reps per LE Therapist providing external target to keep pt from excessively internally rotating at hips when she lifts LEs - improvement in form noted with external target, especially on 2nd set Educated pt she can do this next to a counter at home and use the side of the counter as the external target to keep her knee against as she lifts her LEs Pt reports this as really challenging  Leg press machine 40lbs 2x15reps Requires max A to place feet up on the machine due to hip weakness to lift her legs Pt able to keep her feet from slipping off the foot plate during the exercise today, except for 1x Pt continues to report enjoying this exercise  Educated pt to add weight resistance to sit<>stand exercise at home as she tolerates.  Due to pt denying any dizziness episodes over several session, pt will no longer need to perform her gaze stabilization exercises (pt has already discontinued them). Will ensure pt has understanding of this at next visit.     PATIENT EDUCATION: Education details: Findings of therapy assessment, vestibular testing, vestibular treatment, plan for upcoming sessions Person educated: Patient Education method: Explanation and Handouts Education comprehension: verbalized understanding and needs further education  HOME EXERCISE PROGRAM:  Access Code: [email or web address] URL: [email or web address] Date: 02/12/2024 Prepared by: Connell Kiss  Exercises - Seated Gaze Stabilization with Head Rotation  - 3 x daily - 3 sets - 30sec hold - Seated Gaze Stabilization with Head Nod  - 3 x daily - 3 sets - 30sec hold - Pencil Pushups  - 1 x daily - 7 x weekly - 3 sets - 10 reps - Seated VOR  Cancellation  - 1 x daily - 7 x weekly - 3 sets - 10 reps - Standing March with Counter Support  - 1 x daily - 7 x weekly - 2 sets - 10-15 reps - Narrow Stance with Head Nods and Counter Support  - 1 x daily - 7 x weekly - 2 sets - 10 reps - Supine Heel Slide with Strap  - 2 x daily - 7 x weekly - 2 sets - 5 reps - Supine Bridge  - 1 x daily - 7 x weekly - 3 sets - 15 reps - Resisted Sit-to-Stand With Dumbbell at Chest  - 1 x daily - 3-5 x weekly - 2-3 sets - 10 reps - Hooklying Isometric Clamshell  - 1 x daily - 3-5 x weekly - 2-3 sets - 10 reps   GOALS: Goals reviewed with patient? Yes  SHORT TERM GOALS: Target date: 01/21/2024   Patient will be independent in home exercise program to improve gaze stabilization and/or mobility for improved functional independence with ADLs while experiencing decreased dizziness symptoms. Baseline: initiated on 12/23/2023 02/05/2024: last updated on 01/13/2024 Goal status: IN PROGRESS   LONG TERM GOALS: Target date: 03/03/2024  Patient will reduce dizziness handicap inventory [facility]) score to <36, for less dizziness with ADLs and increased safety with home and  work tasks.  Baseline: 48% 02/05/2024: 14% Goal status: IN PROGRESS  2.  Patient will deny any falls over past 4 weeks to demonstrate improved balance and safe independence with functional mobility.  Baseline: reports frequent falls with 1 on day of initial eval 02/05/2024: 0 falls in past 4 weeks Goal status: MET and CONTINUE  3.  Patient will increase Berg Balance score to > 51/56 to demonstrate improved balance and decreased fall risk during functional activities and ADLs.  Baseline: 01/15/2024: 49/56 02/05/2024: 51/56 Goal status: IN PROGRESS  4.  Patient will increase 10 meter walk test to >1.44m/s using LRAD as to improve gait speed for better community ambulation and to reduce fall risk. Baseline: 0.56m/s  02/05/2024: Average Normal speed: 0.814 m/s & Average Fast speed: 1.065 m/s, no AD,  independently  Goal status: IN PROGRESS  5.  Patient will increase Functional Gait Assessment (FGA) score to >20/30 as to reduce fall risk and improve dynamic gait safety with community ambulation.  Baseline: 15/30 02/05/2024: 16/30 Goal status: IN PROGRESS  ASSESSMENT:  CLINICAL IMPRESSION:   Patient is a 73 y.o. female who was seen today for physical therapy treatment focused on B LE functional strength deficits. Patient participated in B LE functional strengthening on Nustep to improve hip flexor strength to maintain foot placement on pedals as well as hip/knee extensor strength when pushing against resistance.Patient also demonstrates improving stair navigation technique achieving consistent reciprocal pattern on ascent with increased hip flexor strength allowing her to advance limbs with less internal rotation compensation; however, still more impaired on L LE. Patient also demonstrating increasing functional strength in B LEs with ability to progress to increased resistance sit<>stand training as well as increased resistance and improved form with leg press machine. Patient will benefit from continuation of B LE strengthening exercises targeting hip flexors, hip ERs, hip abductors, knee/hip extensors to improve functional transfers, gait, and stair navigation. Patient also continues to demonstrate dynamic gait impairments with tandem, SLS, obstacle navigation, backwards gait, and eyes closed. The pt will benefit from further skilled PT to improve positional and movement evoked dizziness and imbalance as well as B LE strength deficits in order to return pt to PLOF and increase QOL.    OBJECTIVE IMPAIRMENTS: Abnormal gait, cardiopulmonary status limiting activity, decreased activity tolerance, decreased balance, decreased coordination, decreased endurance, decreased knowledge of condition, decreased knowledge of use of DME, decreased mobility, difficulty walking, decreased strength, and dizziness.    ACTIVITY LIMITATIONS: carrying, lifting, bending, standing, squatting, transfers, bed mobility, bathing, toileting, dressing, reach over head, hygiene/grooming, and locomotion level  PARTICIPATION LIMITATIONS: meal prep, cleaning, laundry, shopping, community activity, and yard work  PERSONAL FACTORS: Age, Time since onset of injury/illness/exacerbation, and 3+ comorbidities: Arthritis, clotting disorder, GERD, IBS, L TKA, spastic bladder are also affecting patient's functional outcome.   REHAB POTENTIAL: Good  CLINICAL DECISION MAKING: Evolving/moderate complexity  EVALUATION COMPLEXITY: Moderate   PLAN:  PT FREQUENCY: 1-2x/week  PT DURATION: 12 weeks  PLANNED INTERVENTIONS: [id number]- PT Re-evaluation, 97750- Physical Performance Testing, 97110-Therapeutic exercises, 97530- Therapeutic activity, [id number]- Neuromuscular re-education, 97535- Self Care, [id number]- Manual therapy, [id number]- Gait training, [phone]- Orthotic Initial, [phone]- Orthotic/Prosthetic subsequent, [phone] Canalith repositioning, Patient/Family education, Balance training, Stair training, Taping, Joint mobilization, Spinal mobilization, Vestibular training, Visual/preceptual remediation/compensation, DME instructions, Cryotherapy, Moist heat, and Biofeedback  PLAN FOR UPCOMING SESSIONS:  - follow-up on HEP additions  - resisted sit<>stands & hooklying clamshells (GTB)  - follow-up on discontinuing gaze stabilization exercises - B LE functional strengthening:  -  Nustep   - gait on treadmill, progress to uphill (monitor vitals before and after) to address hip flexor weakness  - reciprocal stair navigation for strengthening  - standing foot taps to step - continue 3lb+ AW resistance as appropriate  - leg press machine - dynamic balance & gait  - vertical head turns  - backwards gait   - obstacle navigation  - tandem > SLS - perform positional vestibular testing with goggles if pt reports onset of positional vertigo symptoms  again    Connell Kiss, PT, DPT, NCS, CSRS Physical Therapist - [facility]  [facility] [place]  12:35 PM 02/17/24

## 2024-02-19 ENCOUNTER — Ambulatory Visit: Attending: Unknown Physician Specialty | Admitting: Physical Therapy

## 2024-02-19 DIAGNOSIS — R42 Dizziness and giddiness: Secondary | ICD-10-CM | POA: Insufficient documentation

## 2024-02-19 DIAGNOSIS — R296 Repeated falls: Secondary | ICD-10-CM | POA: Insufficient documentation

## 2024-02-19 DIAGNOSIS — M6281 Muscle weakness (generalized): Secondary | ICD-10-CM | POA: Diagnosis not present

## 2024-02-19 DIAGNOSIS — R2681 Unsteadiness on feet: Secondary | ICD-10-CM | POA: Diagnosis not present

## 2024-02-19 DIAGNOSIS — R29898 Other symptoms and signs involving the musculoskeletal system: Secondary | ICD-10-CM | POA: Diagnosis not present

## 2024-02-19 NOTE — Therapy (Signed)
 OUTPATIENT PHYSICAL THERAPY VESTIBULAR TREATMENT    Patient Name: Jennifer Frank MRN: [id number] DOB:10/30/1950, 73 y.o., female Today's Date: 02/19/2024  END OF SESSION:    PT End of Session - 02/19/24 1020     Visit Number 13    Number of Visits 24    Date for Recertification  03/03/24    Authorization Type [employer] Medicare    Progress Note Due on Visit 20    PT Start Time 1015    PT Stop Time 1100    PT Time Calculation (min) 45 min    Equipment Utilized During Treatment Gait belt    Activity Tolerance Patient tolerated treatment well;No increased pain    Behavior During Therapy WFL for tasks assessed/performed              Past Medical History:  Diagnosis Date   Arthritis    Clotting disorder    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   Fundic gland polyps of stomach, benign    GERD (gastroesophageal reflux disease)    past hx    Hearing loss    IBS (irritable bowel syndrome)    Personal history of colonic polyps - adenoma 09/22/2013   PONV (postoperative nausea and vomiting)    Reflux    Past Surgical History:  Procedure Laterality Date   HIP ARTHROSCOPY Left 1997   KNEE ARTHROSCOPY Left 1996   KNEE ARTHROSCOPY Right    05-2016 same time as left knee replacement    POLYPECTOMY     TONSILLECTOMY AND ADENOIDECTOMY  1957   TOTAL KNEE ARTHROPLASTY Bilateral 05/27/2016   Procedure: LEFT TOTAL KNEE ARTHROPLASTY WITH RIGHT KNEE ARTHROSCOPY;  Surgeon: Dempsey Sensor, MD;  Location: [facility] OR;  Service: Orthopedics;  Laterality: Bilateral;   UPPER GASTROINTESTINAL ENDOSCOPY     WISDOM TOOTH EXTRACTION     Patient Active Problem List   Diagnosis Date Noted   Syncope 09/14/2023   Other social stressor 07/09/2023   Insomnia 07/09/2023   Back pain 03/12/2023   Skin irritation 03/12/2023   Tremor 09/08/2022   Osteopenia 02/11/2022   SOB (shortness of breath) 05/27/2021   Aortic atherosclerosis 03/12/2020   Medicare annual wellness visit, subsequent 01/10/2019    Advance care planning 12/14/2017   Urinary incontinence 12/14/2017   Primary osteoarthritis of left knee 05/26/2016   Health care maintenance 04/22/2016   Hearing loss 04/22/2014   Varicose veins of bilateral lower extremities with other complications 01/25/2013   Edema 01/25/2013   Hyperlipidemia 02/16/2007   GERD 02/16/2007    PCP: [name], MD REFERRING PROVIDER: [name], MD   REFERRING DIAG: R42 (ICD-10-CM) - Dizziness   THERAPY DIAG:   Muscle weakness (generalized)  Dizziness and giddiness  Unsteadiness on feet  Weakness of lower extremity, unspecified laterality  Repeated falls  ONSET DATE: Around February 2025  Rationale for Evaluation and Treatment: Rehabilitation  SUBJECTIVE:   SUBJECTIVE STATEMENT:  Pt states she is feeling a little sore and a little stiff today. Pt states she had a little soreness along R upper trap going down towards her pectoralis muscle region and pt states she thinks it is from using her home rowing machine.   Pt confirms she has not been performing the gaze stabilization exercises for a while. Therapist educated pt that she no longer needs to perform them and educated that if she has onset of dizziness again then she will need to see a vestibular physical therapist for treatment. Therapist removed those exercises from pt's  HEP.   Denies stumbles/falls.   Pt conitnues to report some difficulty coming to stand from chair without using UE support.  Pt reports she would like to continue focusing on B LE functional strength now that her vestibular symptoms are improved.   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    From Initial Eval: Pt states 2 weeks ago her PCP, Dr. Cleatus, referred her to cardiology and they did an EKG showing normal sinus rhythm. States she did wear heart monitor for 7 days with potential abnormal  rhythm at night of tachycardia. Pt states cardiologist felt she had vertigo and referred her to ENT. Pt states a few years ago she was told she had loss of hearing. Pt states she has learned to lip read really well since then while she saves up money to afford hearing aides. States she visited ENT and had debridement of her ears and they confirmed she needs hearing aides. Pt states ENT didn't see vertigo and referred her to vestibular rehab.   Pt states she has frequent falls and fell this morning when getting up from seated position with L LOB. States she often falls to her LEFT, but sometimes face forward. States when she stands up she has to stand for a little while because if she moves too quick everything is revolving/spinning and she gets nauseous.   Pt states last year or beginning of this year she had EMG and MRI, then was referred to PT for strength training. Pt states imaging found lumbar spine has bulging discs and that the physical therapist at the time discontinued care due to concern for exacerbating her lumbar spine symptoms. Pt states she did her exercises for a little while, but then gradually stopped doing them.  Pt states she uses rollator when her husband is not with her for security and to have seat in tow. Pt states she has sudden onset anxious feeling with no known provocation and then she starts pushing AD too far out in front of her causing instability.   Pt says she has tremors in R hand and she was assessed for Parkinson's due to having family hx on her dad's side. Pt states she saw Neurologist last year and they said she didn't have Parkinson's. Pt also reports having quivering in R LE.  Pt reports this morning she had some numbness down her R UE down to her middle finger, but it is gone now.  Pt states if she stands with both of her hands up over her head she will blackout and pass out because she was doing a task overhead years ago and this happened. States she can raise  1 arm overhead and she is OK.  Pt accompanied by: self, husband in waiting room [name])  Per ENT note on July 21st, 2025: Patient was referred to ENT by Arlyss Cleatus. Pt having vertigo described as dizziness, imbalance, light-headedness, and nausea. Pt has associated nausea and vomiting. Vertigo has been present for 6 months. Typically having episodes daily. Vertigo developed suddenly. The vertigo is positional, occurring while looking up, standing up, lying flat, and upon getting up. Episodes typically last for seconds. The symptoms are made worse by movement. ENT performed bilateral cerumen debridement with microscopy on 12/08/2023. Hall pike was negative bilaterally by ENT. Planning for VNG testing and referred patient to vestibular rehab. Therapy ordered: Vestibular rehabilitation, Habituation exercises, and Substitution exercises.  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 (See chart for full details)  Per cardiology note on 11/17/2023: Patient has syncopal episodes ongoing over the past 1 to 2 years. She walks with a walker sometimes due to lower extremity weakness. Cardiac monitor placed last month showed occasional nonsustained SVT, no sustained arrhythmias. Echocardiogram 08/2023 showed normal EF. No significant structural abnormalities. Orthostatic vitals today with no evidence for orthostasis.    PERTINENT HISTORY: Arthritis, clotting disorder, GERD, IBS, L TKA, spastic bladder  Updated notes from ENT visit on 12/25/2023: RESULTS OF VNG: No nystagmus or subjective dizziness was observed during [facility]. No spontaneous nystagmus was observed. Smooth pursuit was abnormal during the left cycles only. Saccades were normal. Optokinetic testing was abnormal in the faster speed to the right side in the left eye only. There was post-shake nystagmus, with four beats/second to the left side. Positional testing was abnormal, with eight beats/second of ageotropic, fatiguing,  fixating nystagmus in body left and body right recordings. Calorics were abnormal, revealing a 38% left unilateral weakness.   IMPRESSION: Abnormal VNG. A central pathology cannot be ruled out based on today's abnormal test findings: abnormal smooth pursuit and OPKs. Post-headshake nystagmus is consistent with left weakness. Ageotropic, fatiguing nystagmus that fixates is consistent with peripheral pathology, however, was not present in repeat testing with VNG goggles off and eyes open. There was no indication of BPPV. 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  PAIN:  Are you having pain? No  PRECAUTIONS: Fall  RED FLAGS: None   WEIGHT BEARING RESTRICTIONS: No  FALLS: Has patient fallen in last 6 months? Yes. Number of falls 10+ with pt having a fall this morning  LIVING ENVIRONMENT: Lives with: lives with their family and lives with their spouse Lives in: House/apartment Stairs: need to ask Has following equipment at home: Vannie - 4 wheeled, which pt uses if she doesn't have her husband's support  PLOF: Independent with basic ADLs, Independent with household mobility without device, Independent with community mobility without device, and Independent with homemaking with ambulation  PATIENT GOALS: improve dizziness, decrease number of falls, improve balance  OBJECTIVE:  Note: Objective measures were completed at Evaluation unless otherwise noted.  DIAGNOSTIC FINDINGS:   EXAM: MRI HEAD WITHOUT AND WITH CONTRAST TECHNIQUE: Multiplanar, multiecho pulse sequences of the brain and surrounding structures were obtained without and with intravenous contrast. CONTRAST:  10 mL of Vueway  IV COMPARISON:  CT head 05/30/2000 FINDINGS: Brain: No acute infarction, hemorrhage, hydrocephalus, extra-axial collection or mass lesion. No abnormal enhancement. Dedicated imaging of the IAC's was  performed. The visualized seventh and eighth cranial nerves are unremarkable without abnormal enhancement or mass. Normal CSF signal within the labyrinth bilaterally. Vascular: Normal flow voids. Skull and upper cervical spine: Normal marrow signal. Sinuses/Orbits: Clear sinuses.  No acute orbital findings. IMPRESSION: No evidence of acute abnormality or IAC mass. Electronically Signed   By: Gilmore GORMAN Molt M.D.   On: 01/17/2024 11:21  EXAM: LUMBAR SPINE - COMPLETE 4+ VIEW COMPARISON:  MRI of lumbar spine December 06, 2022 FINDINGS: There is no evidence of lumbar spine fracture. Alignment is normal. Mild narrow intervertebral spaces and mild anterior spurring are identified in the upper lumbar spine. Bilateral hip replacements are noted. Minimal facet joint sclerosis are noted in the mid to lower lumbar spine.   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  Electronically Signed   By: Craig Melia HERO.D.  On: 09/18/2023 14:52  COGNITION: Overall cognitive status: Within functional limits for tasks assessed   SENSATION: Not tested  EDEMA:  Not formally measured, but has pitting edema in bilateral lower legs (pt reports being on fluid pill) Pt states she usually wears compression stockings 2-3x/week (not wearing today)  MUSCLE TONE:  Not formally assessed  DTRs:  Achilles: need to assess  POSTURE:  rounded shoulders, forward head, and posterior pelvic tilt  Cervical ROM:    Active Not formally measured, but observation of movement noted A/PROM (deg) eval  Flexion   Extension Decreased from normal   Right lateral flexion   Left lateral flexion   Right rotation   Left rotation Decreased compared to R  (Blank rows = not tested)  STRENGTH: Not formally assessed  LOWER EXTREMITY MMT:   MMT  Need to be assessed  Right  12/26/2023  Left  12/26/2023  Hip flexion 2- 2-  Hip abduction 4+ 4+  Hip adduction 4+ 4+  Hip internal rotation     Hip external rotation    Knee flexion 4+ 3+  Knee extension 4+ 4  Ankle dorsiflexion 4+ 4+  Ankle plantarflexion 4 4  Ankle inversion    Ankle eversion    (Blank rows = not tested)  *reports pain in R thigh/quad muscle and R anterior hip  Manual Muscle Test Scale 0/5 = No muscle contraction can be seen or felt 1/5 = Contraction can be felt, but there is no motion 2-/5 = Part moves through incomplete ROM w/ gravity decreased 2/5 = Part moves through complete ROM w/ gravity decreased 2+/5 = Part moves through incomplete ROM (<50%) against gravity or through complete ROM w/ gravity 3-/5 = Part moves through incomplete ROM (>50%) against gravity 3/5 = Part moves through complete ROM against gravity 3+/5 = Part moves through complete ROM against gravity/slight resistance 4-/5= Holds test position against slight to moderate pressure 4/5 = Part moves through complete ROM against gravity/moderate resistance 4+/5= Holds test position against moderate to strong pressure 5/5 = Part moves through complete ROM against gravity/full resistance  BED MOBILITY:  Sit to supine Modified independence Supine to sit Modified independence *Patient uses UEs to assist with LE management on/off the mat  TRANSFERS: Assistive device utilized: HHA  Sit to stand: CGA Stand to sit: CGA Chair to chair: CGA Floor: not assessed  RAMP: not assessed  CURB: not assessed  GAIT: Gait pattern: pt reports often feeling she veers towards the LEFT, decreased arm swing- Right, decreased arm swing- Left, decreased step length- Right, decreased step length- Left, and wide BOS Distance walked: ~17ft Assistive device utilized: L HHA vs none Level of assistance: CGA Comments:   FUNCTIONAL TESTS:   10 meter walk test: need to assess  Berg Balance Test: need to assess  Functional Gait Assessment: need to assess   PATIENT SURVEYS:   Dizziness Handicap Inventory [facility]): need to assess   VESTIBULAR  ASSESSMENT:    SYMPTOM BEHAVIOR:  Subjective history: see above  Non-Vestibular symptoms: changes in hearing a few years ago, tinnitus, nausea but no vomiting, and hx of migraines years ago but never had to take medication for them  Type of dizziness: Spinning/Vertigo, Lightheadedness/Faint, and spinning when she stands up, states she has significant increased fatigue  Frequency: change positions (lying to sitting, but mostly sit to stand)  Duration: <57minute  Aggravating factors: Induced by position change: supine to sit and sit to stand  Relieving factors: slow movements and trying to stand  before starting to walk  Progression of symptoms: slight increase/worsening of symptoms since February  OCULOMOTOR EXAM:  Ocular Alignment: normal  Ocular ROM: No Limitations  Spontaneous Nystagmus: absent  Gaze-Induced Nystagmus: absent  Smooth Pursuits: impaired with some difficulty tracking, especially when attempting to perform more circular smooth pursuits with pt reporting this causes her to feel dizzy  Saccades: hypometric/undershoots, hypermetric/overshoots, and slow  Convergence/Divergence: unable to converge eyes   VESTIBULAR - OCULAR REFLEX:   Slow VOR: Normal  VOR Cancellation: Corrective Saccades in both directions   Head-Impulse Test: HIT Right: positive HIT Left: positive  Dynamic Visual Acuity: Not able to be assessed   POSITIONAL TESTING:  Loaded Right Dix-Hallpike: no nystagmus, but positive for symptoms with delayed onset around ~30seconds and pt reporting this is her dizziness Loaded Left Dix-Hallpike: no nystagmus and negative for symptoms  Performed Canalith Repositioning: 1x on R side   MOTION SENSITIVITY:    OTHOSTATICS:  12/23/23:  116/64 68bpm supine (no symptoms)  118/61 67bpm seated (no symptoms)  116/65 71bpm standing (no symptoms)  114/68 70bpm standing x 60 sec (no symptoms)  (Negative) *also negative with cardiology                                                                                                                             TREATMENT DATE: 02/19/2024  B UE and B LE reciprocal movement pattern on Nustep for cardiovascular training and B LE functional strengthening against the following levels:  Level 2 resistance for 2 minutes Level 3 for 2 minutes Level 4 for 1 minute Totaling 5 minutes 20 seconds and 343 steps Goal steps per minute (SPM): >60  *Cuing to maintain SPM goal and cuing to focus on use of B LEs to perform the movements rather than B UEs, to target strengthening *Cuing to avoid excessive knee adduction throughout, due to hip abductor weakness  Unless otherwise stated, CGA/SBA was provided and gait belt donned in order to ensure pt safety throughout session.  Gait training ~135ft, no AD, with goal of focusing on increased gait speed.   Stair navigation training ascending/descending 16 steps (6 height) using R UE support on each HR with SBA  Donned 3lb AWs to B LEs Consistent reciprocal pattern on ascent & descent (pt did not revert back to step-to pattern on descent today!) Improved LE alignment overall indicating improving hip strength; however, L LE continues to be excessively internally rotated  Cuing to correct this as much as possible   B LE functional strengthening to improve gait, foot clearance on unlevel surfaces, and stair navigation as follows:  Standing hip abduction (posterior diagonal direction) at balance bar for B UE support 3lb AWs on each LE 2x10reps each LE Max cuing for proper form/technique and not to compensate with knee flexion (improved today) Cuing to avoid excessive lateral trunk lean when lifting LE Cuing for improved eccentric control of movement and not using momentum (this was challenging for patient) Attempted  x10reps of true lateral hip abduction, but pt states she feels this more in quads (likely using compensatory movement patterns) STS from min elevated mat table 2x 12reps  with 7lb dumbbell at chest Cuing for increased anterior hip hinge rising and lowering, cuing for improved eccentric control Continued to notice min R wt shift bias - cuing to bring L foot back underneath BOS for more symmetrical wt bearing Standing foot taps to brown step next to balance bar for UE support as needed 3lb AWs on each LE 2x ~8-10reps per LE Therapist providing external target to keep pt from excessively internally rotating at hips when she lifts LEs - improvement in form noted with external target, especially on 2nd set Used YTB resistance around knees when lifting L LE and pt with improved form; however, when attempted with R LE, it caused increased pulling in pt's R groin, likely due to hip flexor weakness and this added resistance causing compensation; therefore removed band for R side Pt recalls prior education on doing this next to a counter at home and using the side of the counter as the external target to keep her knee against as she lifts her LEs Pt reports this as really challenging  Supine, hooklying hip abduction isometrics into therapist's resistance 2x10reps with 5 sec hold  Pt reports really feeling this in her L>R glutes, especially afterwards when going to stand and walk, indicating good muscle recruitment Therapist adjusting resistance to keeping R hip in more towards slight adduction/neutral to avoid pt compensating with R groin muscle compensation Therapist reinforced education to pt on importance of performing this exercise at home with control     PATIENT EDUCATION: Education details: Findings of therapy assessment, vestibular testing, vestibular treatment, plan for upcoming sessions Person educated: Patient Education method: Explanation and Handouts Education comprehension: verbalized understanding and needs further education  HOME EXERCISE PROGRAM:  Access Code: [email or web address] URL: [email or web address] Date: 02/19/2024 Prepared by: Connell Kiss  Exercises - Standing March with Counter Support  - 1 x daily - 7 x weekly - 2 sets - 10-15 reps - Narrow Stance with Head Nods and Counter Support  - 1 x daily - 7 x weekly - 2 sets - 10 reps - Supine Heel Slide with Strap  - 2 x daily - 7 x weekly - 2 sets - 5 reps - Supine Bridge  - 1 x daily - 7 x weekly - 3 sets - 15 reps - Resisted Sit-to-Stand With Dumbbell at Chest  - 1 x daily - 3-5 x weekly - 2-3 sets - 10 reps - Hooklying Isometric Clamshell  - 1 x daily - 3-5 x weekly - 2-3 sets - 10 reps  Prior Vestibular Exercises (removed from HEP on 02/19/24) - Seated Gaze Stabilization with Head Rotation  - 3 x daily - 3 sets - 30sec hold - Seated Gaze Stabilization with Head Nod  - 3 x daily - 3 sets - 30sec hold - Pencil Pushups  - 1 x daily - 7 x weekly - 3 sets - 10 reps - Seated VOR Cancellation  - 1 x daily - 7 x weekly - 3 sets - 10 reps   GOALS: Goals reviewed with patient? Yes  SHORT TERM GOALS: Target date: 01/21/2024   Patient will be independent in home exercise program to improve gaze stabilization and/or mobility for improved functional independence with ADLs while experiencing decreased dizziness symptoms. Baseline: initiated on 12/23/2023 02/05/2024: last updated on 01/13/2024 Goal status: IN PROGRESS  LONG TERM GOALS: Target date: 03/03/2024  Patient will reduce dizziness handicap inventory [facility]) score to <36, for less dizziness with ADLs and increased safety with home and work tasks.  Baseline: 48% 02/05/2024: 14% Goal status: IN PROGRESS  2.  Patient will deny any falls over past 4 weeks to demonstrate improved balance and safe independence with functional mobility.  Baseline: reports frequent falls with 1 on day of initial eval 02/05/2024: 0 falls in past 4 weeks Goal status: MET and CONTINUE  3.  Patient will increase Berg Balance score to > 51/56 to demonstrate improved balance and decreased fall risk during functional activities and ADLs.  Baseline:  01/15/2024: 49/56 02/05/2024: 51/56 Goal status: IN PROGRESS  4.  Patient will increase 10 meter walk test to >1.75m/s using LRAD as to improve gait speed for better community ambulation and to reduce fall risk. Baseline: 0.55m/s  02/05/2024: Average Normal speed: 0.814 m/s & Average Fast speed: 1.065 m/s, no AD, independently  Goal status: IN PROGRESS  5.  Patient will increase Functional Gait Assessment (FGA) score to >20/30 as to reduce fall risk and improve dynamic gait safety with community ambulation.  Baseline: 15/30 02/05/2024: 16/30 Goal status: IN PROGRESS  ASSESSMENT:  CLINICAL IMPRESSION:   Patient is a 73 y.o. female who was seen today for physical therapy treatment focused on B LE functional strength deficits. Patient participated in B LE functional strengthening on Nustep to improve hip flexor strength to maintain foot placement on pedals as well as hip/knee extensor strength when pushing against resistance. Pt able to tolerate increased resistance today, while achieving increased number of steps! Patient also demonstrates improving stair navigation technique achieving consistent reciprocal pattern on ascent with increased hip flexor strength allowing her to advance limbs with less internal rotation compensation; however, still more impaired on L LE. Patient also demonstrating increasing functional strength in B LEs with ability to maintain increased resistance during sit<>stand training although continues to demo slight R wt shift bias. Patient will benefit from continuation of B LE strengthening exercises targeting hip flexors, hip ERs, hip abductors, knee/hip extensors to improve functional transfers, gait, and stair navigation. Patient also continues to demonstrate dynamic gait impairments with tandem, SLS, obstacle navigation, backwards gait, and eyes closed. The pt will benefit from further skilled PT to improve positional and movement evoked dizziness and imbalance as well as B LE  strength deficits in order to return pt to PLOF and increase QOL.    OBJECTIVE IMPAIRMENTS: Abnormal gait, cardiopulmonary status limiting activity, decreased activity tolerance, decreased balance, decreased coordination, decreased endurance, decreased knowledge of condition, decreased knowledge of use of DME, decreased mobility, difficulty walking, decreased strength, and dizziness.   ACTIVITY LIMITATIONS: carrying, lifting, bending, standing, squatting, transfers, bed mobility, bathing, toileting, dressing, reach over head, hygiene/grooming, and locomotion level  PARTICIPATION LIMITATIONS: meal prep, cleaning, laundry, shopping, community activity, and yard work  PERSONAL FACTORS: Age, Time since onset of injury/illness/exacerbation, and 3+ comorbidities: Arthritis, clotting disorder, GERD, IBS, L TKA, spastic bladder are also affecting patient's functional outcome.   REHAB POTENTIAL: Good  CLINICAL DECISION MAKING: Evolving/moderate complexity  EVALUATION COMPLEXITY: Moderate   PLAN:  PT FREQUENCY: 1-2x/week  PT DURATION: 12 weeks  PLANNED INTERVENTIONS: 97164- PT Re-evaluation, 97750- Physical Performance Testing, 97110-Therapeutic exercises, 97530- Therapeutic activity, [id number]- Neuromuscular re-education, 97535- Self Care, [id number]- Manual therapy, [id number]- Gait training, [id number]- Orthotic Initial, [phone]- Orthotic/Prosthetic subsequent, [phone]- Canalith repositioning, Patient/Family education, Balance training, Stair training, Taping, Joint mobilization, Spinal mobilization, Vestibular training, Visual/preceptual remediation/compensation, DME instructions,  Cryotherapy, Moist heat, and Biofeedback  PLAN FOR UPCOMING SESSIONS:  - B LE functional strengthening:  - Nustep   - gait on treadmill, progress to uphill (monitor vitals before and after) to address hip flexor weakness  - reciprocal stair navigation for strengthening  - standing foot taps to step - continue 3lb+ AW resistance as  appropriate  - leg press machine - dynamic balance & gait  - vertical head turns  - backwards gait   - obstacle navigation  - tandem > SLS - perform positional vestibular testing with goggles if pt reports onset of positional vertigo symptoms again    Connell Kiss, PT, DPT, NCS, CSRS Physical Therapist - [facility]  11:05 AM 02/19/24

## 2024-02-24 ENCOUNTER — Ambulatory Visit: Admitting: Physical Therapy

## 2024-02-24 DIAGNOSIS — R42 Dizziness and giddiness: Secondary | ICD-10-CM

## 2024-02-24 DIAGNOSIS — R296 Repeated falls: Secondary | ICD-10-CM

## 2024-02-24 DIAGNOSIS — M6281 Muscle weakness (generalized): Secondary | ICD-10-CM

## 2024-02-24 DIAGNOSIS — R2681 Unsteadiness on feet: Secondary | ICD-10-CM | POA: Diagnosis not present

## 2024-02-24 DIAGNOSIS — R29898 Other symptoms and signs involving the musculoskeletal system: Secondary | ICD-10-CM | POA: Diagnosis not present

## 2024-02-24 NOTE — Therapy (Signed)
 OUTPATIENT PHYSICAL THERAPY VESTIBULAR TREATMENT    Patient Name: Jennifer Frank MRN: [id number] DOB:[birth year], 73 y.o., female Today's Date: 02/24/2024  END OF SESSION:    PT End of Session - 02/24/24 0758     Visit Number 14    Number of Visits 24    Date for Recertification  03/03/24    Authorization Type [employer] Medicare    Progress Note Due on Visit 20    PT Start Time 0801    PT Stop Time 0845    PT Time Calculation (min) 44 min    Equipment Utilized During Treatment Gait belt    Activity Tolerance Patient tolerated treatment well;No increased pain    Behavior During Therapy WFL for tasks assessed/performed               Past Medical History:  Diagnosis Date   Arthritis    Clotting disorder    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   Fundic gland polyps of stomach, benign    GERD (gastroesophageal reflux disease)    past hx    Hearing loss    IBS (irritable bowel syndrome)    Personal history of colonic polyps - adenoma 09/22/2013   PONV (postoperative nausea and vomiting)    Reflux    Past Surgical History:  Procedure Laterality Date   HIP ARTHROSCOPY Left 1997   KNEE ARTHROSCOPY Left 1996   KNEE ARTHROSCOPY Right    05-2016 same time as left knee replacement    POLYPECTOMY     TONSILLECTOMY AND ADENOIDECTOMY  1957   TOTAL KNEE ARTHROPLASTY Bilateral 05/27/2016   Procedure: LEFT TOTAL KNEE ARTHROPLASTY WITH RIGHT KNEE ARTHROSCOPY;  Surgeon: Dempsey Sensor, MD;  Location: [facility] OR;  Service: Orthopedics;  Laterality: Bilateral;   UPPER GASTROINTESTINAL ENDOSCOPY     WISDOM TOOTH EXTRACTION     Patient Active Problem List   Diagnosis Date Noted   Syncope 09/14/2023   Other social stressor 07/09/2023   Insomnia 07/09/2023   Back pain 03/12/2023   Skin irritation 03/12/2023   Tremor 09/08/2022   Osteopenia 02/11/2022   SOB (shortness of breath) 05/27/2021   Aortic atherosclerosis 03/12/2020   Medicare annual wellness visit, subsequent 01/10/2019    Advance care planning 12/14/2017   Urinary incontinence 12/14/2017   Primary osteoarthritis of left knee 05/26/2016   Health care maintenance 04/22/2016   Hearing loss 04/22/2014   Varicose veins of bilateral lower extremities with other complications 01/25/2013   Edema 01/25/2013   Hyperlipidemia 02/16/2007   GERD 02/16/2007    PCP: [name], MD REFERRING PROVIDER: [name], MD   REFERRING DIAG: R42 (ICD-10-CM) - Dizziness   THERAPY DIAG:  Muscle weakness (generalized)  Dizziness and giddiness  Unsteadiness on feet  Weakness of lower extremity, unspecified laterality  Repeated falls  ONSET DATE: Around February 2025  Rationale for Evaluation and Treatment: Rehabilitation  SUBJECTIVE:   SUBJECTIVE STATEMENT:  Pt states this past weekend she went on a short trip with her husband and son to [facility]. States she took her rollator to walk around the long place they were touring and had to sit a couple of times, but did well.  Pt states years ago her and her husband purchased a vibrating pad that she can stand on with handle bars. Pt states she was told that if she uses it for a day then it is equal to walking over a mile. Therapist educated pt on recommendation to participate in exercise over use  of this machine only.   Pt states she is still using her rowing machine daily at home for 20reps x2 sets now.   Pt states she was a little sore after last session, but states it wasn't as bad as the time before.  Denies stumbles/falls. Denies pain.   Pt reports she would like to continue focusing on B LE functional strength now that her vestibular symptoms are improved.   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    From Initial Eval: Pt states 2 weeks ago her PCP, Dr. Cleatus, referred her to cardiology and they did an EKG showing normal sinus rhythm.  States she did wear heart monitor for 7 days with potential abnormal rhythm at night of tachycardia. Pt states cardiologist felt she had vertigo and referred her to ENT. Pt states a few years ago she was told she had loss of hearing. Pt states she has learned to lip read really well since then while she saves up money to afford hearing aides. States she visited ENT and had debridement of her ears and they confirmed she needs hearing aides. Pt states ENT didn't see vertigo and referred her to vestibular rehab.   Pt states she has frequent falls and fell this morning when getting up from seated position with L LOB. States she often falls to her LEFT, but sometimes face forward. States when she stands up she has to stand for a little while because if she moves too quick everything is revolving/spinning and she gets nauseous.   Pt states last year or beginning of this year she had EMG and MRI, then was referred to PT for strength training. Pt states imaging found lumbar spine has bulging discs and that the physical therapist at the time discontinued care due to concern for exacerbating her lumbar spine symptoms. Pt states she did her exercises for a little while, but then gradually stopped doing them.  Pt states she uses rollator when her husband is not with her for security and to have seat in tow. Pt states she has sudden onset anxious feeling with no known provocation and then she starts pushing AD too far out in front of her causing instability.   Pt says she has tremors in R hand and she was assessed for Parkinson's due to having family hx on her dad's side. Pt states she saw Neurologist last year and they said she didn't have Parkinson's. Pt also reports having quivering in R LE.  Pt reports this morning she had some numbness down her R UE down to her middle finger, but it is gone now.  Pt states if she stands with both of her hands up over her head she will blackout and pass out because she was  doing a task overhead years ago and this happened. States she can raise 1 arm overhead and she is OK.  Pt accompanied by: self, husband in waiting room [name])  Per ENT note on July 21st, 2025: Patient was referred to ENT by Arlyss Cleatus. Pt having vertigo described as dizziness, imbalance, light-headedness, and nausea. Pt has associated nausea and vomiting. Vertigo has been present for 6 months. Typically having episodes daily. Vertigo developed suddenly. The vertigo is positional, occurring while looking up, standing up, lying flat, and upon getting up. Episodes typically last for seconds. The symptoms are made worse by movement. ENT performed bilateral cerumen debridement with  microscopy on 12/08/2023. Hall pike was negative bilaterally by ENT. Planning for VNG testing and referred patient to vestibular rehab. Therapy ordered: Vestibular rehabilitation, Habituation exercises, and Substitution exercises. 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 (See chart for full details)  Per cardiology note on 11/17/2023: Patient has syncopal episodes ongoing over the past 1 to 2 years. She walks with a walker sometimes due to lower extremity weakness. Cardiac monitor placed last month showed occasional nonsustained SVT, no sustained arrhythmias. Echocardiogram 08/2023 showed normal EF. No significant structural abnormalities. Orthostatic vitals today with no evidence for orthostasis.    PERTINENT HISTORY: Arthritis, clotting disorder, GERD, IBS, L TKA, spastic bladder  Updated notes from ENT visit on 12/25/2023: RESULTS OF VNG: No nystagmus or subjective dizziness was observed during [facility]. No spontaneous nystagmus was observed. Smooth pursuit was abnormal during the left cycles only. Saccades were normal. Optokinetic testing was abnormal in the faster speed to the right side in the left eye only. There was post-shake nystagmus, with four beats/second to the left side. Positional  testing was abnormal, with eight beats/second of ageotropic, fatiguing, fixating nystagmus in body left and body right recordings. Calorics were abnormal, revealing a 38% left unilateral weakness.   IMPRESSION: Abnormal VNG. A central pathology cannot be ruled out based on today's abnormal test findings: abnormal smooth pursuit and OPKs. Post-headshake nystagmus is consistent with left weakness. Ageotropic, fatiguing nystagmus that fixates is consistent with peripheral pathology, however, was not present in repeat testing with VNG goggles off and eyes open. There was no indication of BPPV. 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  PAIN:  Are you having pain? No  PRECAUTIONS: Fall  RED FLAGS: None   WEIGHT BEARING RESTRICTIONS: No  FALLS: Has patient fallen in last 6 months? Yes. Number of falls 10+ with pt having a fall this morning  LIVING ENVIRONMENT: Lives with: lives with their family and lives with their spouse Lives in: House/apartment Stairs: need to ask Has following equipment at home: Vannie - 4 wheeled, which pt uses if she doesn't have her husband's support  PLOF: Independent with basic ADLs, Independent with household mobility without device, Independent with community mobility without device, and Independent with homemaking with ambulation  PATIENT GOALS: improve dizziness, decrease number of falls, improve balance  OBJECTIVE:  Note: Objective measures were completed at Evaluation unless otherwise noted.  DIAGNOSTIC FINDINGS:   EXAM: MRI HEAD WITHOUT AND WITH CONTRAST TECHNIQUE: Multiplanar, multiecho pulse sequences of the brain and surrounding structures were obtained without and with intravenous contrast. CONTRAST:  10 mL of Vueway  IV COMPARISON:  CT head 05/30/2000 FINDINGS: Brain: No acute infarction, hemorrhage, hydrocephalus, extra-axial collection or mass  lesion. No abnormal enhancement. Dedicated imaging of the IAC's was performed. The visualized seventh and eighth cranial nerves are unremarkable without abnormal enhancement or mass. Normal CSF signal within the labyrinth bilaterally. Vascular: Normal flow voids. Skull and upper cervical spine: Normal marrow signal. Sinuses/Orbits: Clear sinuses.  No acute orbital findings. IMPRESSION: No evidence of acute abnormality or IAC mass. Electronically Signed   By: Gilmore GORMAN Molt M.D.   On: 01/17/2024 11:21  EXAM: LUMBAR SPINE - COMPLETE 4+ VIEW COMPARISON:  MRI of lumbar spine December 06, 2022 FINDINGS: There is no evidence of lumbar spine fracture. Alignment is normal. Mild narrow intervertebral spaces and mild anterior spurring are identified in the upper lumbar spine. Bilateral hip replacements are noted. Minimal facet joint sclerosis are  noted in the mid to lower lumbar spine.   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  Electronically Signed   By: Craig Farr M.D.   On: 09/18/2023 14:52  COGNITION: Overall cognitive status: Within functional limits for tasks assessed   SENSATION: Not tested  EDEMA:  Not formally measured, but has pitting edema in bilateral lower legs (pt reports being on fluid pill) Pt states she usually wears compression stockings 2-3x/week (not wearing today)  MUSCLE TONE:  Not formally assessed  DTRs:  Achilles: need to assess  POSTURE:  rounded shoulders, forward head, and posterior pelvic tilt  Cervical ROM:    Active Not formally measured, but observation of movement noted A/PROM (deg) eval  Flexion   Extension Decreased from normal   Right lateral flexion   Left lateral flexion   Right rotation   Left rotation Decreased compared to R  (Blank rows = not tested)  STRENGTH: Not formally assessed  LOWER EXTREMITY MMT:   MMT  Need to be assessed  Right  12/26/2023  Left  12/26/2023  Hip flexion 2- 2-  Hip  abduction 4+ 4+  Hip adduction 4+ 4+  Hip internal rotation    Hip external rotation    Knee flexion 4+ 3+  Knee extension 4+ 4  Ankle dorsiflexion 4+ 4+  Ankle plantarflexion 4 4  Ankle inversion    Ankle eversion    (Blank rows = not tested)  *reports pain in R thigh/quad muscle and R anterior hip  Manual Muscle Test Scale 0/5 = No muscle contraction can be seen or felt 1/5 = Contraction can be felt, but there is no motion 2-/5 = Part moves through incomplete ROM w/ gravity decreased 2/5 = Part moves through complete ROM w/ gravity decreased 2+/5 = Part moves through incomplete ROM (<50%) against gravity or through complete ROM w/ gravity 3-/5 = Part moves through incomplete ROM (>50%) against gravity 3/5 = Part moves through complete ROM against gravity 3+/5 = Part moves through complete ROM against gravity/slight resistance 4-/5= Holds test position against slight to moderate pressure 4/5 = Part moves through complete ROM against gravity/moderate resistance 4+/5= Holds test position against moderate to strong pressure 5/5 = Part moves through complete ROM against gravity/full resistance  BED MOBILITY:  Sit to supine Modified independence Supine to sit Modified independence *Patient uses UEs to assist with LE management on/off the mat  TRANSFERS: Assistive device utilized: HHA  Sit to stand: CGA Stand to sit: CGA Chair to chair: CGA Floor: not assessed  RAMP: not assessed  CURB: not assessed  GAIT: Gait pattern: pt reports often feeling she veers towards the LEFT, decreased arm swing- Right, decreased arm swing- Left, decreased step length- Right, decreased step length- Left, and wide BOS Distance walked: ~171ft Assistive device utilized: L HHA vs none Level of assistance: CGA Comments:   FUNCTIONAL TESTS:   10 meter walk test: need to assess  Berg Balance Test: need to assess  Functional Gait Assessment: need to assess   PATIENT SURVEYS:   Dizziness  Handicap Inventory [facility]): need to assess   VESTIBULAR ASSESSMENT:    SYMPTOM BEHAVIOR:  Subjective history: see above  Non-Vestibular symptoms: changes in hearing a few years ago, tinnitus, nausea but no vomiting, and hx of migraines years ago but never had to take medication for them  Type of dizziness: Spinning/Vertigo, Lightheadedness/Faint, and spinning when she stands up, states she has significant increased fatigue  Frequency: change positions (lying to sitting, but  mostly sit to stand)  Duration: <62minute  Aggravating factors: Induced by position change: supine to sit and sit to stand  Relieving factors: slow movements and trying to stand before starting to walk  Progression of symptoms: slight increase/worsening of symptoms since February  OCULOMOTOR EXAM:  Ocular Alignment: normal  Ocular ROM: No Limitations  Spontaneous Nystagmus: absent  Gaze-Induced Nystagmus: absent  Smooth Pursuits: impaired with some difficulty tracking, especially when attempting to perform more circular smooth pursuits with pt reporting this causes her to feel dizzy  Saccades: hypometric/undershoots, hypermetric/overshoots, and slow  Convergence/Divergence: unable to converge eyes   VESTIBULAR - OCULAR REFLEX:   Slow VOR: Normal  VOR Cancellation: Corrective Saccades in both directions   Head-Impulse Test: HIT Right: positive HIT Left: positive  Dynamic Visual Acuity: Not able to be assessed   POSITIONAL TESTING:  Loaded Right Dix-Hallpike: no nystagmus, but positive for symptoms with delayed onset around ~30seconds and pt reporting this is her dizziness Loaded Left Dix-Hallpike: no nystagmus and negative for symptoms  Performed Canalith Repositioning: 1x on R side   MOTION SENSITIVITY:    OTHOSTATICS:  12/23/23:  116/64 68bpm supine (no symptoms)  118/61 67bpm seated (no symptoms)  116/65 71bpm standing (no symptoms)  114/68 70bpm standing x 60 sec (no symptoms)  (Negative) *also  negative with cardiology                                                                                                                            TREATMENT DATE: 02/24/2024  B UE and B LE reciprocal movement pattern on Nustep for cardiovascular training and B LE functional strengthening against the following levels:  Level 2 resistance for 2 minutes Level 3 for 2 minutes Level 4 for 2 minute Totaling 6 minutes and 307 steps Goal steps per minute (SPM): >60, which pt had more difficulty maintaining today due to different machine - therapist providing visual and auditory cuing to increase speed *Cuing to avoid excessive knee adduction throughout, due to hip abductor weakness  Unless otherwise stated, CGA/SBA was provided and gait belt donned in order to ensure pt safety throughout session.   Stair navigation training ascending/descending 16 steps (6 height) using R UE support on each HR with SBA  Donned 4lb AWs to B LEs Consistent reciprocal pattern on ascent & descent (pt occasionally reverts back to step-to pattern on descent today with the heavier weights) Improved LE alignment overall indicating improving hip strength; however, L LE continues to be excessively internally rotated  Cuing to correct this as much as possible with tactile and verbal cues   B LE functional strengthening to improve gait, foot clearance on unlevel surfaces, and stair navigation as follows:  Standing hip abduction (posterior diagonal direction) at balance bar for B UE support 4lb AWs on each LE 2x10reps each LE Max cuing for proper form/technique and not to compensate with knee flexion (slightly more challenging today with heavier weights) Cuing to avoid excessive lateral  trunk lean when lifting LE Cuing for improved eccentric control of movement and not using momentum (this was challenging for patient) Standing modified fire hydrant with focus on hip external rotation and hip abduction 2lb AWs on B  LEs Therapist providing external target of boxing glove to tap with her knees to achieve appropriate movement 2x ~6 reps per LE  Reports she can feel it on her L side more than her R Cuing throughout to maintain upright posture and hip extension through stance leg Pt reports this really causes strong muscle burn Standing foot taps to brown step next to balance bar for UE support as needed 2lb AWs on each LE 2x ~8reps per LE Pt reporting continued strong muscle burn and hip flexors are fatiguing Therapist providing external target to keep pt from excessively internally rotating at hips when she lifts LEs - improvement in form noted with external target, especially on 2nd set Pt continues to state this is really challenging  Transitioned to hip/knee extensor muscle exercises due to hip flexor fatigue STS from green chair without UE support 10reps + 5reps (seated break between) without weight since from lower surface today Requires light min A for initiating coming to stand final 2 reps of each set Cuing for increased anterior hip hinge rising and lowering, cuing for improved eccentric control Continued to notice min R wt shift bias with L foot slightly forward compared to R Leg press machine 40lbs x20reps 55lb x15reps Requires max A to place feet up on the machine due to hip weakness to lift her legs Pt able to keep her feet from slipping off the foot plate during the exercise today Pt continues to report enjoying this exercise   Pt reports she is really feeling the exercises in her muscles, especially in L glute near sacrum at end of session, but improves during gait back out to reception area.     PATIENT EDUCATION: Education details: Findings of therapy assessment, vestibular testing, vestibular treatment, plan for upcoming sessions Person educated: Patient Education method: Explanation and Handouts Education comprehension: verbalized understanding and needs further  education  HOME EXERCISE PROGRAM:  Access Code: [email or web address] URL: [email or web address] Date: 02/19/2024 Prepared by: Connell Kiss  Exercises - Standing March with Counter Support  - 1 x daily - 7 x weekly - 2 sets - 10-15 reps - Narrow Stance with Head Nods and Counter Support  - 1 x daily - 7 x weekly - 2 sets - 10 reps - Supine Heel Slide with Strap  - 2 x daily - 7 x weekly - 2 sets - 5 reps - Supine Bridge  - 1 x daily - 7 x weekly - 3 sets - 15 reps - Resisted Sit-to-Stand With Dumbbell at Chest  - 1 x daily - 3-5 x weekly - 2-3 sets - 10 reps - Hooklying Isometric Clamshell  - 1 x daily - 3-5 x weekly - 2-3 sets - 10 reps  Prior Vestibular Exercises (removed from HEP on 02/19/24) - Seated Gaze Stabilization with Head Rotation  - 3 x daily - 3 sets - 30sec hold - Seated Gaze Stabilization with Head Nod  - 3 x daily - 3 sets - 30sec hold - Pencil Pushups  - 1 x daily - 7 x weekly - 3 sets - 10 reps - Seated VOR Cancellation  - 1 x daily - 7 x weekly - 3 sets - 10 reps   GOALS: Goals reviewed with patient? Yes  SHORT TERM GOALS: Target date:  01/21/2024   Patient will be independent in home exercise program to improve gaze stabilization and/or mobility for improved functional independence with ADLs while experiencing decreased dizziness symptoms. Baseline: initiated on 12/23/2023 02/05/2024: last updated on 01/13/2024 Goal status: IN PROGRESS   LONG TERM GOALS: Target date: 03/03/2024  Patient will reduce dizziness handicap inventory [facility]) score to <36, for less dizziness with ADLs and increased safety with home and work tasks.  Baseline: 48% 02/05/2024: 14% Goal status: IN PROGRESS  2.  Patient will deny any falls over past 4 weeks to demonstrate improved balance and safe independence with functional mobility.  Baseline: reports frequent falls with 1 on day of initial eval 02/05/2024: 0 falls in past 4 weeks Goal status: MET and CONTINUE  3.  Patient will  increase Berg Balance score to > 51/56 to demonstrate improved balance and decreased fall risk during functional activities and ADLs.  Baseline: 01/15/2024: 49/56 02/05/2024: 51/56 Goal status: IN PROGRESS  4.  Patient will increase 10 meter walk test to >1.53m/s using LRAD as to improve gait speed for better community ambulation and to reduce fall risk. Baseline: 0.24m/s  02/05/2024: Average Normal speed: 0.814 m/s & Average Fast speed: 1.065 m/s, no AD, independently  Goal status: IN PROGRESS  5.  Patient will increase Functional Gait Assessment (FGA) score to >20/30 as to reduce fall risk and improve dynamic gait safety with community ambulation.  Baseline: 15/30 02/05/2024: 16/30 Goal status: IN PROGRESS  ASSESSMENT:  CLINICAL IMPRESSION:   Patient is a 73 y.o. female who was seen today for physical therapy treatment focused on B LE functional strength deficits. Patient participated in B LE functional strengthening on Nustep to improve hip flexor strength to maintain foot placement on pedals as well as hip/knee extensor strength when pushing against resistance. Pt able to tolerate increased resistance today; however, overall demos decreased speed of movements on this older machine. Patient able to progress in AW resistance during stair navigation training today with primarily reciprocal pattern and increased hip flexor strength allowing her to advance limbs with minor internal rotation compensation; and, still more impaired on L LE. Therapy session focused on exercises promoting increased hip external rotation and hip abduction activation during hip flexion movements with external targets for improved form/technique. Patient also demonstrating increasing functional strength in B LEs with transition to performing sit<>stands from standard chair height, but removed resistance to increase pt's success. Patient also able to progress in resistance during leg press machine. Patient will benefit from  continuation of B LE strengthening exercises targeting hip flexors, hip ERs, hip abductors, knee/hip extensors to improve functional transfers, gait, and stair navigation. Patient also continues to demonstrate dynamic gait impairments with tandem, SLS, obstacle navigation, backwards gait, and eyes closed. The pt will benefit from further skilled PT to improve positional and movement evoked dizziness and imbalance as well as B LE strength deficits in order to return pt to PLOF and increase QOL.    OBJECTIVE IMPAIRMENTS: Abnormal gait, cardiopulmonary status limiting activity, decreased activity tolerance, decreased balance, decreased coordination, decreased endurance, decreased knowledge of condition, decreased knowledge of use of DME, decreased mobility, difficulty walking, decreased strength, and dizziness.   ACTIVITY LIMITATIONS: carrying, lifting, bending, standing, squatting, transfers, bed mobility, bathing, toileting, dressing, reach over head, hygiene/grooming, and locomotion level  PARTICIPATION LIMITATIONS: meal prep, cleaning, laundry, shopping, community activity, and yard work  PERSONAL FACTORS: Age, Time since onset of injury/illness/exacerbation, and 3+ comorbidities: Arthritis, clotting disorder, GERD, IBS, L TKA, spastic bladder are also  affecting patient's functional outcome.   REHAB POTENTIAL: Good  CLINICAL DECISION MAKING: Evolving/moderate complexity  EVALUATION COMPLEXITY: Moderate   PLAN:  PT FREQUENCY: 1-2x/week  PT DURATION: 12 weeks  PLANNED INTERVENTIONS: 97164- PT Re-evaluation, 97750- Physical Performance Testing, 97110-Therapeutic exercises, 97530- Therapeutic activity, [id number]- Neuromuscular re-education, 97535- Self Care, [id number]- Manual therapy, [phone]- Gait training, [phone]- Orthotic Initial, [phone]- Orthotic/Prosthetic subsequent, [phone]- Canalith repositioning, Patient/Family education, Balance training, Stair training, Taping, Joint mobilization, Spinal  mobilization, Vestibular training, Visual/preceptual remediation/compensation, DME instructions, Cryotherapy, Moist heat, and Biofeedback  PLAN FOR UPCOMING SESSIONS:  - B LE functional strengthening:  - Nustep   - gait on treadmill, progress to uphill (monitor vitals before and after) to address hip flexor weakness  - reciprocal stair navigation for strengthening  - standing foot taps to step - continue 3lb+ AW resistance as appropriate  - leg press machine  *continue focus on hip external rotation and hip abductor strengthening - dynamic balance & gait  - vertical head turns  - backwards gait   - obstacle navigation  - tandem > SLS - perform positional vestibular testing with goggles if pt reports onset of positional vertigo symptoms again    Connell Kiss, PT, DPT, NCS, CSRS Physical Therapist - [facility]  [facility]  8:52 AM 02/24/24

## 2024-02-26 ENCOUNTER — Ambulatory Visit: Admitting: Physical Therapy

## 2024-02-26 DIAGNOSIS — M6281 Muscle weakness (generalized): Secondary | ICD-10-CM

## 2024-02-26 DIAGNOSIS — R29898 Other symptoms and signs involving the musculoskeletal system: Secondary | ICD-10-CM | POA: Diagnosis not present

## 2024-02-26 DIAGNOSIS — R42 Dizziness and giddiness: Secondary | ICD-10-CM

## 2024-02-26 DIAGNOSIS — R296 Repeated falls: Secondary | ICD-10-CM

## 2024-02-26 DIAGNOSIS — R2681 Unsteadiness on feet: Secondary | ICD-10-CM | POA: Diagnosis not present

## 2024-02-26 NOTE — Therapy (Signed)
 OUTPATIENT PHYSICAL THERAPY VESTIBULAR TREATMENT    Patient Name: Jennifer Frank MRN: [id number] DOB:06-07-1950, 73 y.o., female Today's Date: 02/26/2024  END OF SESSION:    PT End of Session - 02/26/24 1154     Visit Number 15    Number of Visits 24    Date for Recertification  03/03/24    Authorization Type [employer] Medicare    Progress Note Due on Visit 20    PT Start Time 1153    PT Stop Time 1233    PT Time Calculation (min) 40 min    Equipment Utilized During Treatment Gait belt    Activity Tolerance Patient tolerated treatment well;No increased pain    Behavior During Therapy WFL for tasks assessed/performed                Past Medical History:  Diagnosis Date   Arthritis    Clotting disorder    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   Fundic gland polyps of stomach, benign    GERD (gastroesophageal reflux disease)    past hx    Hearing loss    IBS (irritable bowel syndrome)    Personal history of colonic polyps - adenoma 09/22/2013   PONV (postoperative nausea and vomiting)    Reflux    Past Surgical History:  Procedure Laterality Date   HIP ARTHROSCOPY Left 1997   KNEE ARTHROSCOPY Left 1996   KNEE ARTHROSCOPY Right    05-2016 same time as left knee replacement    POLYPECTOMY     TONSILLECTOMY AND ADENOIDECTOMY  1957   TOTAL KNEE ARTHROPLASTY Bilateral 05/27/2016   Procedure: LEFT TOTAL KNEE ARTHROPLASTY WITH RIGHT KNEE ARTHROSCOPY;  Surgeon: Dempsey Sensor, MD;  Location: [facility] OR;  Service: Orthopedics;  Laterality: Bilateral;   UPPER GASTROINTESTINAL ENDOSCOPY     WISDOM TOOTH EXTRACTION     Patient Active Problem List   Diagnosis Date Noted   Syncope 09/14/2023   Other social stressor 07/09/2023   Insomnia 07/09/2023   Back pain 03/12/2023   Skin irritation 03/12/2023   Tremor 09/08/2022   Osteopenia 02/11/2022   SOB (shortness of breath) 05/27/2021   Aortic atherosclerosis 03/12/2020   Medicare annual wellness visit, subsequent 01/10/2019    Advance care planning 12/14/2017   Urinary incontinence 12/14/2017   Primary osteoarthritis of left knee 05/26/2016   Health care maintenance 04/22/2016   Hearing loss 04/22/2014   Varicose veins of bilateral lower extremities with other complications 01/25/2013   Edema 01/25/2013   Hyperlipidemia 02/16/2007   GERD 02/16/2007    PCP: [name], MD REFERRING PROVIDER: [name], MD   REFERRING DIAG: R42 (ICD-10-CM) - Dizziness   THERAPY DIAG:  Muscle weakness (generalized)  Dizziness and giddiness  Unsteadiness on feet  Weakness of lower extremity, unspecified laterality  Repeated falls  ONSET DATE: Around February 2025  Rationale for Evaluation and Treatment: Rehabilitation  SUBJECTIVE:   SUBJECTIVE STATEMENT:  Pt states she can still feel the muscles in her proximal hamstrings are sore today following therapy session on Tuesday. Pt states otherwise she is doing OK. Denies stumbles/falls. Denies pain, just states she has soreness.   Pt states she is still using her rowing machine daily at home for at least 20reps 1-2x/daily.   Pt reports she would like to continue focusing on B LE functional strength now that her vestibular symptoms are improved.   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    From Initial Eval: Pt states 2 weeks ago her PCP, Dr. Cleatus, referred her to cardiology and they did an EKG showing normal sinus rhythm. States she did wear heart monitor for 7 days with potential abnormal rhythm at night of tachycardia. Pt states cardiologist felt she had vertigo and referred her to ENT. Pt states a few years ago she was told she had loss of hearing. Pt states she has learned to lip read really well since then while she saves up money to afford hearing aides. States she visited ENT and had debridement of her ears and they confirmed she needs  hearing aides. Pt states ENT didn't see vertigo and referred her to vestibular rehab.   Pt states she has frequent falls and fell this morning when getting up from seated position with L LOB. States she often falls to her LEFT, but sometimes face forward. States when she stands up she has to stand for a little while because if she moves too quick everything is revolving/spinning and she gets nauseous.   Pt states last year or beginning of this year she had EMG and MRI, then was referred to PT for strength training. Pt states imaging found lumbar spine has bulging discs and that the physical therapist at the time discontinued care due to concern for exacerbating her lumbar spine symptoms. Pt states she did her exercises for a little while, but then gradually stopped doing them.  Pt states she uses rollator when her husband is not with her for security and to have seat in tow. Pt states she has sudden onset anxious feeling with no known provocation and then she starts pushing AD too far out in front of her causing instability.   Pt says she has tremors in R hand and she was assessed for Parkinson's due to having family hx on her dad's side. Pt states she saw Neurologist last year and they said she didn't have Parkinson's. Pt also reports having quivering in R LE.  Pt reports this morning she had some numbness down her R UE down to her middle finger, but it is gone now.  Pt states if she stands with both of her hands up over her head she will blackout and pass out because she was doing a task overhead years ago and this happened. States she can raise 1 arm overhead and she is OK.  Pt accompanied by: self, husband in waiting room [name])  Per ENT note on July 21st, 2025: Patient was referred to ENT by Arlyss Cleatus. Pt having vertigo described as dizziness, imbalance, light-headedness, and nausea. Pt has associated nausea and vomiting. Vertigo has been present for 6 months. Typically having episodes  daily. Vertigo developed suddenly. The vertigo is positional, occurring while looking up, standing up, lying flat, and upon getting up. Episodes typically last for seconds. The symptoms are made worse by movement. ENT performed bilateral cerumen debridement with microscopy on 12/08/2023. Hall pike was negative bilaterally by ENT. Planning for VNG testing and referred patient to vestibular rehab. Therapy ordered: Vestibular rehabilitation, Habituation exercises, and Substitution exercises. 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 (See chart for full details)  Per cardiology note on 11/17/2023: Patient has syncopal episodes ongoing over the past 1 to 2 years. She walks with a walker sometimes due to lower extremity weakness. Cardiac monitor placed last month showed occasional nonsustained SVT, no sustained arrhythmias. Echocardiogram 08/2023 showed normal EF. No significant structural abnormalities.  Orthostatic vitals today with no evidence for orthostasis.    PERTINENT HISTORY: Arthritis, clotting disorder, GERD, IBS, L TKA, spastic bladder  Updated notes from ENT visit on 12/25/2023: RESULTS OF VNG: No nystagmus or subjective dizziness was observed during [facility]. No spontaneous nystagmus was observed. Smooth pursuit was abnormal during the left cycles only. Saccades were normal. Optokinetic testing was abnormal in the faster speed to the right side in the left eye only. There was post-shake nystagmus, with four beats/second to the left side. Positional testing was abnormal, with eight beats/second of ageotropic, fatiguing, fixating nystagmus in body left and body right recordings. Calorics were abnormal, revealing a 38% left unilateral weakness.   IMPRESSION: Abnormal VNG. A central pathology cannot be ruled out based on today's abnormal test findings: abnormal smooth pursuit and OPKs. Post-headshake nystagmus is consistent with left weakness. Ageotropic, fatiguing nystagmus  that fixates is consistent with peripheral pathology, however, was not present in repeat testing with VNG goggles off and eyes open. There was no indication of BPPV. 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  PAIN:  Are you having pain? No  PRECAUTIONS: Fall  RED FLAGS: None   WEIGHT BEARING RESTRICTIONS: No  FALLS: Has patient fallen in last 6 months? Yes. Number of falls 10+ with pt having a fall this morning  LIVING ENVIRONMENT: Lives with: lives with their family and lives with their spouse Lives in: House/apartment Stairs: need to ask Has following equipment at home: Vannie - 4 wheeled, which pt uses if she doesn't have her husband's support  PLOF: Independent with basic ADLs, Independent with household mobility without device, Independent with community mobility without device, and Independent with homemaking with ambulation  PATIENT GOALS: improve dizziness, decrease number of falls, improve balance  OBJECTIVE:  Note: Objective measures were completed at Evaluation unless otherwise noted.  DIAGNOSTIC FINDINGS:   EXAM: MRI HEAD WITHOUT AND WITH CONTRAST TECHNIQUE: Multiplanar, multiecho pulse sequences of the brain and surrounding structures were obtained without and with intravenous contrast. CONTRAST:  10 mL of Vueway  IV COMPARISON:  CT head 05/30/2000 FINDINGS: Brain: No acute infarction, hemorrhage, hydrocephalus, extra-axial collection or mass lesion. No abnormal enhancement. Dedicated imaging of the IAC's was performed. The visualized seventh and eighth cranial nerves are unremarkable without abnormal enhancement or mass. Normal CSF signal within the labyrinth bilaterally. Vascular: Normal flow voids. Skull and upper cervical spine: Normal marrow signal. Sinuses/Orbits: Clear sinuses.  No acute orbital findings. IMPRESSION: No evidence of acute abnormality or  IAC mass. Electronically Signed   By: Gilmore GORMAN Molt M.D.   On: 01/17/2024 11:21  EXAM: LUMBAR SPINE - COMPLETE 4+ VIEW COMPARISON:  MRI of lumbar spine December 06, 2022 FINDINGS: There is no evidence of lumbar spine fracture. Alignment is normal. Mild narrow intervertebral spaces and mild anterior spurring are identified in the upper lumbar spine. Bilateral hip replacements are noted. Minimal facet joint sclerosis are noted in the mid to lower lumbar spine.   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  Electronically Signed   By: Craig Farr M.D.   On: 09/18/2023 14:52  COGNITION: Overall cognitive status: Within functional limits for tasks assessed   SENSATION: Not tested  EDEMA:  Not formally measured, but has pitting edema in bilateral lower legs (pt reports being on fluid pill) Pt states she usually wears compression stockings 2-3x/week (not wearing today)  MUSCLE TONE:  Not formally assessed  DTRs:  Achilles: need to  assess  POSTURE:  rounded shoulders, forward head, and posterior pelvic tilt  Cervical ROM:    Active Not formally measured, but observation of movement noted A/PROM (deg) eval  Flexion   Extension Decreased from normal   Right lateral flexion   Left lateral flexion   Right rotation   Left rotation Decreased compared to R  (Blank rows = not tested)  STRENGTH: Not formally assessed  LOWER EXTREMITY MMT:   MMT  Need to be assessed  Right  12/26/2023  Left  12/26/2023  Hip flexion 2- 2-  Hip abduction 4+ 4+  Hip adduction 4+ 4+  Hip internal rotation    Hip external rotation    Knee flexion 4+ 3+  Knee extension 4+ 4  Ankle dorsiflexion 4+ 4+  Ankle plantarflexion 4 4  Ankle inversion    Ankle eversion    (Blank rows = not tested)  *reports pain in R thigh/quad muscle and R anterior hip  Manual Muscle Test Scale 0/5 = No muscle contraction can be seen or felt 1/5 = Contraction can be felt, but there  is no motion 2-/5 = Part moves through incomplete ROM w/ gravity decreased 2/5 = Part moves through complete ROM w/ gravity decreased 2+/5 = Part moves through incomplete ROM (<50%) against gravity or through complete ROM w/ gravity 3-/5 = Part moves through incomplete ROM (>50%) against gravity 3/5 = Part moves through complete ROM against gravity 3+/5 = Part moves through complete ROM against gravity/slight resistance 4-/5= Holds test position against slight to moderate pressure 4/5 = Part moves through complete ROM against gravity/moderate resistance 4+/5= Holds test position against moderate to strong pressure 5/5 = Part moves through complete ROM against gravity/full resistance  BED MOBILITY:  Sit to supine Modified independence Supine to sit Modified independence *Patient uses UEs to assist with LE management on/off the mat  TRANSFERS: Assistive device utilized: HHA  Sit to stand: CGA Stand to sit: CGA Chair to chair: CGA Floor: not assessed  RAMP: not assessed  CURB: not assessed  GAIT: Gait pattern: pt reports often feeling she veers towards the LEFT, decreased arm swing- Right, decreased arm swing- Left, decreased step length- Right, decreased step length- Left, and wide BOS Distance walked: ~147ft Assistive device utilized: L HHA vs none Level of assistance: CGA Comments:   FUNCTIONAL TESTS:   10 meter walk test: need to assess  Berg Balance Test: need to assess  Functional Gait Assessment: need to assess   PATIENT SURVEYS:   Dizziness Handicap Inventory [facility]): need to assess   VESTIBULAR ASSESSMENT:    SYMPTOM BEHAVIOR:  Subjective history: see above  Non-Vestibular symptoms: changes in hearing a few years ago, tinnitus, nausea but no vomiting, and hx of migraines years ago but never had to take medication for them  Type of dizziness: Spinning/Vertigo, Lightheadedness/Faint, and spinning when she stands up, states she has significant increased  fatigue  Frequency: change positions (lying to sitting, but mostly sit to stand)  Duration: <26minute  Aggravating factors: Induced by position change: supine to sit and sit to stand  Relieving factors: slow movements and trying to stand before starting to walk  Progression of symptoms: slight increase/worsening of symptoms since February  OCULOMOTOR EXAM:  Ocular Alignment: normal  Ocular ROM: No Limitations  Spontaneous Nystagmus: absent  Gaze-Induced Nystagmus: absent  Smooth Pursuits: impaired with some difficulty tracking, especially when attempting to perform more circular smooth pursuits with pt reporting this causes her to feel dizzy  Saccades: hypometric/undershoots,  hypermetric/overshoots, and slow  Convergence/Divergence: unable to converge eyes   VESTIBULAR - OCULAR REFLEX:   Slow VOR: Normal  VOR Cancellation: Corrective Saccades in both directions   Head-Impulse Test: HIT Right: positive HIT Left: positive  Dynamic Visual Acuity: Not able to be assessed   POSITIONAL TESTING:  Loaded Right Dix-Hallpike: no nystagmus, but positive for symptoms with delayed onset around ~30seconds and pt reporting this is her dizziness Loaded Left Dix-Hallpike: no nystagmus and negative for symptoms  Performed Canalith Repositioning: 1x on R side   MOTION SENSITIVITY:    OTHOSTATICS:  12/23/23:  116/64 68bpm supine (no symptoms)  118/61 67bpm seated (no symptoms)  116/65 71bpm standing (no symptoms)  114/68 70bpm standing x 60 sec (no symptoms)  (Negative) *also negative with cardiology                                                                                                                            TREATMENT DATE: 02/26/2024  B UE and B LE reciprocal movement pattern on Nustep for cardiovascular training and B LE functional strengthening against the following levels:  Level 2 resistance for 2 minutes Level 3 for 1 minutes Level 4 for 3 minute Totaling 6 minutes and 440  steps Goal steps per minute (SPM): >80 on the new machine, but with increased resistance had to lower the goal to >70, therapist providing visual and auditory cuing to increase speed *Cuing to avoid excessive knee adduction throughout, due to hip abductor weakness  Unless otherwise stated, CGA/SBA was provided and gait belt donned in order to ensure pt safety throughout session.  Stair navigation training ascending/descending 16 steps (6 height) using R UE support on each HR with SBA progressed to no UE support with min A for balance Donned 3lb AWs to B LEs Consistent reciprocal pattern on ascent & descent with UE support, but when progressed to no UE support pt progressed up to reciprocal pattern ascending, but maintained step-to during descent Pt demos excellent ability to power up through stance limbs for reciprocal pattern on ascent! Improved LE alignment overall indicating improving hip strength; however, continues to have excessive hip internal rotation bilaterally Cuing to correct this as much as possible with tactile and verbal cues   B LE functional strengthening to improve gait, foot clearance on unlevel surfaces, and stair navigation as follows:  STS from 22in height EOM to progress towards green chair x10reps  On 1st set had 2x posterior LOB due to lack of adequate anterior hip hinge/weight shift Added 7lb dumbbell weight at chest during 2nd set - x10reps Continued cuing for increased anterior hip hinge when rising and lowering, cuing for improved eccentric control Continued to notice min R wt shift bias with L foot slightly forward compared to R - cuing to bring L foot back underneath BOS At stairs performed reciprocal step-through on steps 1 & 2 then tapped top step with contralateral LE 3lb AWs on each LE X5 reps per  each side B UE support on HRs for balance Demos increased difficulty lifting R LE to top step compared to L LE and overall had excessive hip internal rotation  bilaterally Standing foot taps to brown step next to balance bar for UE support as needed 3lb AWs on each LE 2x ~8reps per LE Therapist providing external target to keep pt from excessively internally rotating at hips when she lifts LEs - improvement in form noted with external target, especially over multiple sessions Mirror feedback utilized for biofeedback Pt continues to state this is really challenging  Standing modified fire hydrant with focus on hip external rotation and hip abduction 3lb AWs on B LEs Therapist providing external target of boxing glove to tap with her knees to achieve appropriate movement and to step over kettlebell weight to improve hip flexion and foot clearance while performing hip abduction 2x ~8 reps per LE  Reports she can feel it on her R side more than her L today Cuing throughout to maintain upright posture and hip extension through stance leg Seated hip abduction against RTB resistance to see if pt can activate hip abductors while in a hip flexed position X15 reps Pt reports unable to feel it in her lateral hip muscles Therapist cuing to ensure pt not compensating with ankle movements to achieve hip abduction     PATIENT EDUCATION: Education details: Findings of therapy assessment, vestibular testing, vestibular treatment, plan for upcoming sessions Person educated: Patient Education method: Explanation and Handouts Education comprehension: verbalized understanding and needs further education  HOME EXERCISE PROGRAM:  Access Code: [email or web address] URL: [email or web address] Date: 02/19/2024 Prepared by: Connell Kiss  Exercises - Standing March with Counter Support  - 1 x daily - 7 x weekly - 2 sets - 10-15 reps - Narrow Stance with Head Nods and Counter Support  - 1 x daily - 7 x weekly - 2 sets - 10 reps - Supine Heel Slide with Strap  - 2 x daily - 7 x weekly - 2 sets - 5 reps - Supine Bridge  - 1 x daily - 7 x weekly - 3 sets - 15  reps - Resisted Sit-to-Stand With Dumbbell at Chest  - 1 x daily - 3-5 x weekly - 2-3 sets - 10 reps - Hooklying Isometric Clamshell  - 1 x daily - 3-5 x weekly - 2-3 sets - 10 reps  Prior Vestibular Exercises (removed from HEP on 02/19/24) - Seated Gaze Stabilization with Head Rotation  - 3 x daily - 3 sets - 30sec hold - Seated Gaze Stabilization with Head Nod  - 3 x daily - 3 sets - 30sec hold - Pencil Pushups  - 1 x daily - 7 x weekly - 3 sets - 10 reps - Seated VOR Cancellation  - 1 x daily - 7 x weekly - 3 sets - 10 reps   GOALS: Goals reviewed with patient? Yes  SHORT TERM GOALS: Target date: 01/21/2024   Patient will be independent in home exercise program to improve gaze stabilization and/or mobility for improved functional independence with ADLs while experiencing decreased dizziness symptoms. Baseline: initiated on 12/23/2023 02/05/2024: last updated on 01/13/2024 Goal status: IN PROGRESS   LONG TERM GOALS: Target date: 03/03/2024  Patient will reduce dizziness handicap inventory [facility]) score to <36, for less dizziness with ADLs and increased safety with home and work tasks.  Baseline: 48% 02/05/2024: 14% Goal status: IN PROGRESS  2.  Patient will deny any falls over past 4 weeks to demonstrate  improved balance and safe independence with functional mobility.  Baseline: reports frequent falls with 1 on day of initial eval 02/05/2024: 0 falls in past 4 weeks Goal status: MET and CONTINUE  3.  Patient will increase Berg Balance score to > 51/56 to demonstrate improved balance and decreased fall risk during functional activities and ADLs.  Baseline: 01/15/2024: 49/56 02/05/2024: 51/56 Goal status: IN PROGRESS  4.  Patient will increase 10 meter walk test to >1.73m/s using LRAD as to improve gait speed for better community ambulation and to reduce fall risk. Baseline: 0.4m/s  02/05/2024: Average Normal speed: 0.814 m/s & Average Fast speed: 1.065 m/s, no AD, independently  Goal  status: IN PROGRESS  5.  Patient will increase Functional Gait Assessment (FGA) score to >20/30 as to reduce fall risk and improve dynamic gait safety with community ambulation.  Baseline: 15/30 02/05/2024: 16/30 Goal status: IN PROGRESS  ASSESSMENT:  CLINICAL IMPRESSION:   Patient is a 73 y.o. female who was seen today for physical therapy treatment focused on B LE functional strength deficits. Patient participated in B LE functional strengthening on Nustep to improve hip flexor strength to maintain foot placement on pedals as well as hip/knee extensor strength when pushing against resistance. Pt able to tolerate increased resistance today and on the newer machine was able to maintain average SPM of 67, achieving a total of 440 steps!. Patient able to progress to decreased B UE support during stair navigation training today with primarily reciprocal pattern on ascent demonstrating increased hip flexor strength allowing her to advance limbs with minor internal rotation compensation and maintain her balance with only min A. Therapy session focused on exercises promoting increased hip external rotation and hip abduction activation during hip flexion movements with external targets for improved form/technique. Patient will benefit from continuation of B LE strengthening exercises targeting hip flexors, hip ERs, hip abductors, knee/hip extensors to improve functional transfers, gait, and stair navigation. Patient also continues to demonstrate dynamic gait impairments with tandem, SLS, obstacle navigation, backwards gait, and eyes closed. The pt will benefit from further skilled PT to improve positional and movement evoked dizziness and imbalance as well as B LE strength deficits in order to return pt to PLOF and increase QOL.    OBJECTIVE IMPAIRMENTS: Abnormal gait, cardiopulmonary status limiting activity, decreased activity tolerance, decreased balance, decreased coordination, decreased endurance,  decreased knowledge of condition, decreased knowledge of use of DME, decreased mobility, difficulty walking, decreased strength, and dizziness.   ACTIVITY LIMITATIONS: carrying, lifting, bending, standing, squatting, transfers, bed mobility, bathing, toileting, dressing, reach over head, hygiene/grooming, and locomotion level  PARTICIPATION LIMITATIONS: meal prep, cleaning, laundry, shopping, community activity, and yard work  PERSONAL FACTORS: Age, Time since onset of injury/illness/exacerbation, and 3+ comorbidities: Arthritis, clotting disorder, GERD, IBS, L TKA, spastic bladder are also affecting patient's functional outcome.   REHAB POTENTIAL: Good  CLINICAL DECISION MAKING: Evolving/moderate complexity  EVALUATION COMPLEXITY: Moderate   PLAN:  PT FREQUENCY: 1-2x/week  PT DURATION: 12 weeks  PLANNED INTERVENTIONS: 97164- PT Re-evaluation, 97750- Physical Performance Testing, 97110-Therapeutic exercises, 97530- Therapeutic activity, [id number]- Neuromuscular re-education, 97535- Self Care, [id number]- Manual therapy, [id number]- Gait training, [phone]- Orthotic Initial, [phone]- Orthotic/Prosthetic subsequent, [phone]- Canalith repositioning, Patient/Family education, Balance training, Stair training, Taping, Joint mobilization, Spinal mobilization, Vestibular training, Visual/preceptual remediation/compensation, DME instructions, Cryotherapy, Moist heat, and Biofeedback  PLAN FOR UPCOMING SESSIONS:  - B LE functional strengthening:  - Nustep   - gait on treadmill, progress to uphill (monitor vitals before and after) to address  hip flexor weakness  - reciprocal stair navigation for strengthening  - standing foot taps to step - continue 3lb+ AW resistance as appropriate  - leg press machine  *continue focus on hip external rotation and hip abductor strengthening - dynamic balance & gait  - vertical head turns  - backwards gait   - obstacle navigation  - tandem > SLS - perform positional vestibular  testing with goggles if pt reports onset of positional vertigo symptoms again    Connell Kiss, PT, DPT, NCS, CSRS Physical Therapist - [facility]  [facility]  12:34 PM 02/26/24

## 2024-03-02 ENCOUNTER — Ambulatory Visit: Admitting: Physical Therapy

## 2024-03-04 ENCOUNTER — Ambulatory Visit: Admitting: Physical Therapy

## 2024-03-04 DIAGNOSIS — M6281 Muscle weakness (generalized): Secondary | ICD-10-CM | POA: Diagnosis not present

## 2024-03-04 DIAGNOSIS — R296 Repeated falls: Secondary | ICD-10-CM | POA: Diagnosis not present

## 2024-03-04 DIAGNOSIS — R2681 Unsteadiness on feet: Secondary | ICD-10-CM | POA: Diagnosis not present

## 2024-03-04 DIAGNOSIS — R42 Dizziness and giddiness: Secondary | ICD-10-CM

## 2024-03-04 DIAGNOSIS — R29898 Other symptoms and signs involving the musculoskeletal system: Secondary | ICD-10-CM

## 2024-03-04 NOTE — Therapy (Signed)
 OUTPATIENT PHYSICAL THERAPY VESTIBULAR TREATMENT / RECERT    Patient Name: Jennifer Frank MRN: [id number] DOB:1950-07-03, 73 y.o., female Today's Date: 03/05/2024  END OF SESSION:    PT End of Session - 03/04/24 1024     Visit Number 16    Number of Visits 40    Date for Recertification  04/30/24    Authorization Type [employer] Medicare    Progress Note Due on Visit 20    PT Start Time 1022    PT Stop Time 1105    PT Time Calculation (min) 43 min    Equipment Utilized During Treatment Gait belt    Activity Tolerance Patient tolerated treatment well;No increased pain    Behavior During Therapy WFL for tasks assessed/performed           Past Medical History:  Diagnosis Date   Arthritis    Clotting disorder    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   Fundic gland polyps of stomach, benign    GERD (gastroesophageal reflux disease)    past hx    Hearing loss    IBS (irritable bowel syndrome)    Personal history of colonic polyps - adenoma 09/22/2013   PONV (postoperative nausea and vomiting)    Reflux    Past Surgical History:  Procedure Laterality Date   HIP ARTHROSCOPY Left 1997   KNEE ARTHROSCOPY Left 1996   KNEE ARTHROSCOPY Right    05-2016 same time as left knee replacement    POLYPECTOMY     TONSILLECTOMY AND ADENOIDECTOMY  1957   TOTAL KNEE ARTHROPLASTY Bilateral 05/27/2016   Procedure: LEFT TOTAL KNEE ARTHROPLASTY WITH RIGHT KNEE ARTHROSCOPY;  Surgeon: Dempsey Sensor, MD;  Location: [facility] OR;  Service: Orthopedics;  Laterality: Bilateral;   UPPER GASTROINTESTINAL ENDOSCOPY     WISDOM TOOTH EXTRACTION     Patient Active Problem List   Diagnosis Date Noted   Syncope 09/14/2023   Other social stressor 07/09/2023   Insomnia 07/09/2023   Back pain 03/12/2023   Skin irritation 03/12/2023   Tremor 09/08/2022   Osteopenia 02/11/2022   SOB (shortness of breath) 05/27/2021   Aortic atherosclerosis 03/12/2020   Medicare annual wellness visit, subsequent 01/10/2019    Advance care planning 12/14/2017   Urinary incontinence 12/14/2017   Primary osteoarthritis of left knee 05/26/2016   Health care maintenance 04/22/2016   Hearing loss 04/22/2014   Varicose veins of bilateral lower extremities with other complications 01/25/2013   Edema 01/25/2013   Hyperlipidemia 02/16/2007   GERD 02/16/2007    PCP: [name], MD REFERRING PROVIDER: [name], MD   REFERRING DIAG: R42 (ICD-10-CM) - Dizziness   THERAPY DIAG:  Muscle weakness (generalized)  Dizziness and giddiness  Unsteadiness on feet  Weakness of lower extremity, unspecified laterality  Repeated falls  ONSET DATE: Around February 2025  Rationale for Evaluation and Treatment: Rehabilitation  SUBJECTIVE:   SUBJECTIVE STATEMENT:  Pt states she felt lousy Tuesday morning and is concerned she had a 24hr virus or bug of some sort, but is feeling better today. Pt states she is really discouraged because she doesn't feel like she is getting better and her balance is still really off. Patient states she was at her grandkids game, standing on bleachers and when she went to turn to get her seat cushion and then turned back, she had LOB when she went to step down off the bleachers requiring someone to assist in balance recovery.  Also, pt states she was rolling over on  the couch and got too close to EOB and couldn't grasp to catch herself and she started sliding off of the edge so she had to slide into the floor. Pt states her and her husband had a hard time problem solving how to get her up off the floor, but used a foot stool to get up off the floor and it worked well. States she used to could get up off the floor and is discouraged that she is now no longer able to do that.  Pt states she did have a spell of lightheadedness last Thursday and it was following a long walk when she felt warn out like someone had sucked all of the air out of me.  Pt states after all of the long  walking she felt really lightheaded, but was able to rest for symptoms to improve.  No reports of pain at this time.   Pt reports she would like to continue focusing on B LE functional strength now that her vestibular symptoms are improved.   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    From Initial Eval: Pt states 2 weeks ago her PCP, Dr. Cleatus, referred her to cardiology and they did an EKG showing normal sinus rhythm. States she did wear heart monitor for 7 days with potential abnormal rhythm at night of tachycardia. Pt states cardiologist felt she had vertigo and referred her to ENT. Pt states a few years ago she was told she had loss of hearing. Pt states she has learned to lip read really well since then while she saves up money to afford hearing aides. States she visited ENT and had debridement of her ears and they confirmed she needs hearing aides. Pt states ENT didn't see vertigo and referred her to vestibular rehab.   Pt states she has frequent falls and fell this morning when getting up from seated position with L LOB. States she often falls to her LEFT, but sometimes face forward. States when she stands up she has to stand for a little while because if she moves too quick everything is revolving/spinning and she gets nauseous.   Pt states last year or beginning of this year she had EMG and MRI, then was referred to PT for strength training. Pt states imaging found lumbar spine has bulging discs and that the physical therapist at the time discontinued care due to concern for exacerbating her lumbar spine symptoms. Pt states she did her exercises for a little while, but then gradually stopped doing them.  Pt states she uses rollator when her husband is not with her for security and to have seat in tow. Pt states she has sudden onset anxious feeling with no known provocation and then she  starts pushing AD too far out in front of her causing instability.   Pt says she has tremors in R hand and she was assessed for Parkinson's due to having family hx on her dad's side. Pt states she saw Neurologist last year and they said she didn't have Parkinson's. Pt also reports having quivering in R LE.  Pt reports this morning she had some numbness down her R UE down to her middle finger, but it is gone now.  Pt states if she stands with both of her hands up over her head she will blackout and pass out because she was doing a task overhead years ago and  this happened. States she can raise 1 arm overhead and she is OK.  Pt accompanied by: self, husband in waiting room [name])  Per ENT note on July 21st, 2025: Patient was referred to ENT by Arlyss Solian. Pt having vertigo described as dizziness, imbalance, light-headedness, and nausea. Pt has associated nausea and vomiting. Vertigo has been present for 6 months. Typically having episodes daily. Vertigo developed suddenly. The vertigo is positional, occurring while looking up, standing up, lying flat, and upon getting up. Episodes typically last for seconds. The symptoms are made worse by movement. ENT performed bilateral cerumen debridement with microscopy on 12/08/2023. Hall pike was negative bilaterally by ENT. Planning for VNG testing and referred patient to vestibular rehab. Therapy ordered: Vestibular rehabilitation, Habituation exercises, and Substitution exercises. 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 (See chart for full details)  Per cardiology note on 11/17/2023: Patient has syncopal episodes ongoing over the past 1 to 2 years. She walks with a walker sometimes due to lower extremity weakness. Cardiac monitor placed last month showed occasional nonsustained SVT, no sustained arrhythmias. Echocardiogram 08/2023 showed normal EF. No significant structural abnormalities. Orthostatic vitals today with no evidence  for orthostasis.    PERTINENT HISTORY: Arthritis, clotting disorder, GERD, IBS, L TKA, spastic bladder  Updated notes from ENT visit on 12/25/2023: RESULTS OF VNG: No nystagmus or subjective dizziness was observed during [facility]. No spontaneous nystagmus was observed. Smooth pursuit was abnormal during the left cycles only. Saccades were normal. Optokinetic testing was abnormal in the faster speed to the right side in the left eye only. There was post-shake nystagmus, with four beats/second to the left side. Positional testing was abnormal, with eight beats/second of ageotropic, fatiguing, fixating nystagmus in body left and body right recordings. Calorics were abnormal, revealing a 38% left unilateral weakness.   IMPRESSION: Abnormal VNG. A central pathology cannot be ruled out based on today's abnormal test findings: abnormal smooth pursuit and OPKs. Post-headshake nystagmus is consistent with left weakness. Ageotropic, fatiguing nystagmus that fixates is consistent with peripheral pathology, however, was not present in repeat testing with VNG goggles off and eyes open. There was no indication of BPPV. 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  PAIN:  Are you having pain? No  PRECAUTIONS: Fall  RED FLAGS: None   WEIGHT BEARING RESTRICTIONS: No  FALLS: Has patient fallen in last 6 months? Yes. Number of falls 10+ with pt having a fall this morning  LIVING ENVIRONMENT: Lives with: lives with their family and lives with their spouse Lives in: House/apartment Stairs: need to ask Has following equipment at home: Vannie - 4 wheeled, which pt uses if she doesn't have her husband's support  PLOF: Independent with basic ADLs, Independent with household mobility without device, Independent with community mobility without device, and Independent with homemaking with ambulation  PATIENT  GOALS: improve dizziness, decrease number of falls, improve balance  OBJECTIVE:  Note: Objective measures were completed at Evaluation unless otherwise noted.  DIAGNOSTIC FINDINGS:   EXAM: MRI HEAD WITHOUT AND WITH CONTRAST TECHNIQUE: Multiplanar, multiecho pulse sequences of the brain and surrounding structures were obtained without and with intravenous contrast. CONTRAST:  10 mL of Vueway  IV COMPARISON:  CT head 05/30/2000 FINDINGS: Brain: No acute infarction, hemorrhage, hydrocephalus, extra-axial collection or mass lesion. No abnormal enhancement. Dedicated imaging of the IAC's was performed. The visualized seventh and eighth cranial nerves are unremarkable without abnormal enhancement or mass. Normal  CSF signal within the labyrinth bilaterally. Vascular: Normal flow voids. Skull and upper cervical spine: Normal marrow signal. Sinuses/Orbits: Clear sinuses.  No acute orbital findings. IMPRESSION: No evidence of acute abnormality or IAC mass. Electronically Signed   By: Gilmore GORMAN Molt M.D.   On: 01/17/2024 11:21  EXAM: LUMBAR SPINE - COMPLETE 4+ VIEW COMPARISON:  MRI of lumbar spine December 06, 2022 FINDINGS: There is no evidence of lumbar spine fracture. Alignment is normal. Mild narrow intervertebral spaces and mild anterior spurring are identified in the upper lumbar spine. Bilateral hip replacements are noted. Minimal facet joint sclerosis are noted in the mid to lower lumbar spine.   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  Electronically Signed   By: Craig Farr M.D.   On: 09/18/2023 14:52  COGNITION: Overall cognitive status: Within functional limits for tasks assessed   SENSATION: Not tested  EDEMA:  Not formally measured, but has pitting edema in bilateral lower legs (pt reports being on fluid pill) Pt states she usually wears compression stockings 2-3x/week (not wearing today)  MUSCLE TONE:  Not formally assessed  DTRs:   Achilles: need to assess  POSTURE:  rounded shoulders, forward head, and posterior pelvic tilt  Cervical ROM:    Active Not formally measured, but observation of movement noted A/PROM (deg) eval  Flexion   Extension Decreased from normal   Right lateral flexion   Left lateral flexion   Right rotation   Left rotation Decreased compared to R  (Blank rows = not tested)  STRENGTH: Not formally assessed  LOWER EXTREMITY MMT:   MMT  Need to be assessed  Right  12/26/2023  Left  12/26/2023  Hip flexion 2- 2-  Hip abduction 4+ 4+  Hip adduction 4+ 4+  Hip internal rotation    Hip external rotation    Knee flexion 4+ 3+  Knee extension 4+ 4  Ankle dorsiflexion 4+ 4+  Ankle plantarflexion 4 4  Ankle inversion    Ankle eversion    (Blank rows = not tested)  *reports pain in R thigh/quad muscle and R anterior hip  Manual Muscle Test Scale 0/5 = No muscle contraction can be seen or felt 1/5 = Contraction can be felt, but there is no motion 2-/5 = Part moves through incomplete ROM w/ gravity decreased 2/5 = Part moves through complete ROM w/ gravity decreased 2+/5 = Part moves through incomplete ROM (<50%) against gravity or through complete ROM w/ gravity 3-/5 = Part moves through incomplete ROM (>50%) against gravity 3/5 = Part moves through complete ROM against gravity 3+/5 = Part moves through complete ROM against gravity/slight resistance 4-/5= Holds test position against slight to moderate pressure 4/5 = Part moves through complete ROM against gravity/moderate resistance 4+/5= Holds test position against moderate to strong pressure 5/5 = Part moves through complete ROM against gravity/full resistance  BED MOBILITY:  Sit to supine Modified independence Supine to sit Modified independence *Patient uses UEs to assist with LE management on/off the mat  TRANSFERS: Assistive device utilized: HHA  Sit to stand: CGA Stand to sit: CGA Chair to chair: CGA Floor: not  assessed  RAMP: not assessed  CURB: not assessed  GAIT: Gait pattern: pt reports often feeling she veers towards the LEFT, decreased arm swing- Right, decreased arm swing- Left, decreased step length- Right, decreased step length- Left, and wide BOS Distance walked: ~115ft Assistive device utilized: L HHA vs none Level of assistance: CGA Comments:   FUNCTIONAL TESTS:  10 meter walk test: need to assess  Berg Balance Test: need to assess  Functional Gait Assessment: need to assess   PATIENT SURVEYS:   Dizziness Handicap Inventory [facility]): need to assess   VESTIBULAR ASSESSMENT:    SYMPTOM BEHAVIOR:  Subjective history: see above  Non-Vestibular symptoms: changes in hearing a few years ago, tinnitus, nausea but no vomiting, and hx of migraines years ago but never had to take medication for them  Type of dizziness: Spinning/Vertigo, Lightheadedness/Faint, and spinning when she stands up, states she has significant increased fatigue  Frequency: change positions (lying to sitting, but mostly sit to stand)  Duration: <49minute  Aggravating factors: Induced by position change: supine to sit and sit to stand  Relieving factors: slow movements and trying to stand before starting to walk  Progression of symptoms: slight increase/worsening of symptoms since February  OCULOMOTOR EXAM:  Ocular Alignment: normal  Ocular ROM: No Limitations  Spontaneous Nystagmus: absent  Gaze-Induced Nystagmus: absent  Smooth Pursuits: impaired with some difficulty tracking, especially when attempting to perform more circular smooth pursuits with pt reporting this causes her to feel dizzy  Saccades: hypometric/undershoots, hypermetric/overshoots, and slow  Convergence/Divergence: unable to converge eyes   VESTIBULAR - OCULAR REFLEX:   Slow VOR: Normal  VOR Cancellation: Corrective Saccades in both directions   Head-Impulse Test: HIT Right: positive HIT Left: positive  Dynamic Visual Acuity: Not  able to be assessed   POSITIONAL TESTING:  Loaded Right Dix-Hallpike: no nystagmus, but positive for symptoms with delayed onset around ~30seconds and pt reporting this is her dizziness Loaded Left Dix-Hallpike: no nystagmus and negative for symptoms  Performed Canalith Repositioning: 1x on R side   MOTION SENSITIVITY:    OTHOSTATICS:  12/23/23:  116/64 68bpm supine (no symptoms)  118/61 67bpm seated (no symptoms)  116/65 71bpm standing (no symptoms)  114/68 70bpm standing x 60 sec (no symptoms)  (Negative) *also negative with cardiology                                                                                                                            TREATMENT DATE: 03/05/2024  Unless otherwise stated, CGA/SBA was provided and gait belt donned in order to ensure pt safety throughout session.  Therapy session transitioned focus from functional B LE strength training today to focus more on dynamic balance with some strength components.  Dynamic gait training warm-up for 3 minutes achieving ~647ft  Dual-task challenge throughout of head turns on verbal command to identify targets in environment - only slight imbalance noted and pt able to utilize appropriate strategies to regain balance  Pt reports the backs of her knees fatigue with longer distance gait Cuing throughout for increased gait speed  B LE functional strengthening of STS x10reps no UE support from EOM  Dynamic gait training including:  Forward reciprocal stepping over 3x 1/2 foam rolls - 5 laps Noticed improved hip flexion with decreased compensation to clear feet via hip IR and  knee flexion Added 3lb AWs to each LE performed additional 3 laps Side stepping over 3x 1/2 foam rolls Wearing 3lb AWs  4 laps Had to cue for increased lateral step length to improve ability to successfully step 1x with each LE to clear obstacle  Cuing and facilitation to avoid compensation by turning at her hips Backwards step-to  pattern down/back 4 laps Noticed pt with increased difficulty recovering balance when stepping back leading with L LE; therefore, transitioned to block practice forward/backwards stepping leading with L LE with goal to stick the landing and only step back 1x with each foot without requiring extra steps to catch balance   Therapist spent time at end of session reviewing patient's progress with therapy thus far and emphasizing her improvements noted because pt continues to report feeling really discouraged that in the past 2 years she has gone from being able to get on/off ground in her flower garden, weed eat, and mow with a self-propelling lawn mower to no longer being able to do those activities. Therapist reinforced education on importance of participating in HEP, increasing overall activity level, and continued participation in physical therapy with focus on consistency and gradual improvements.    PATIENT EDUCATION: Education details: Findings of therapy assessment, vestibular testing, vestibular treatment, plan for upcoming sessions Person educated: Patient Education method: Explanation and Handouts Education comprehension: verbalized understanding and needs further education  HOME EXERCISE PROGRAM:  Access Code: [email or web address] URL: [email or web address] Date: 02/19/2024 Prepared by: Connell Kiss  Exercises - Standing March with Counter Support  - 1 x daily - 7 x weekly - 2 sets - 10-15 reps - Narrow Stance with Head Nods and Counter Support  - 1 x daily - 7 x weekly - 2 sets - 10 reps - Supine Heel Slide with Strap  - 2 x daily - 7 x weekly - 2 sets - 5 reps - Supine Bridge  - 1 x daily - 7 x weekly - 3 sets - 15 reps - Resisted Sit-to-Stand With Dumbbell at Chest  - 1 x daily - 3-5 x weekly - 2-3 sets - 10 reps - Hooklying Isometric Clamshell  - 1 x daily - 3-5 x weekly - 2-3 sets - 10 reps  Prior Vestibular Exercises (removed from HEP on 02/19/24) - Seated Gaze  Stabilization with Head Rotation  - 3 x daily - 3 sets - 30sec hold - Seated Gaze Stabilization with Head Nod  - 3 x daily - 3 sets - 30sec hold - Pencil Pushups  - 1 x daily - 7 x weekly - 3 sets - 10 reps - Seated VOR Cancellation  - 1 x daily - 7 x weekly - 3 sets - 10 reps   GOALS: Goals reviewed with patient? Yes  SHORT TERM GOALS: Target date: 04/02/2024   Patient will be independent in home exercise program to improve gaze stabilization and/or mobility for improved functional independence with ADLs while experiencing decreased dizziness symptoms. Baseline: initiated on 12/23/2023 02/05/2024: last updated on 01/13/2024 Goal status: IN PROGRESS   LONG TERM GOALS: Target date: 04/30/2024  Patient will reduce dizziness handicap inventory [facility]) score to <36, for less dizziness with ADLs and increased safety with home and work tasks.  Baseline: 48% 02/05/2024: 14% Goal status: IN PROGRESS  2.  Patient will deny any falls over past 4 weeks to demonstrate improved balance and safe independence with functional mobility.  Baseline: reports frequent falls with 1 on day of initial eval 02/05/2024: 0 falls in past  4 weeks Goal status: MET and CONTINUE  3.  Patient will increase Berg Balance score to > 51/56 to demonstrate improved balance and decreased fall risk during functional activities and ADLs.  Baseline: 01/15/2024: 49/56 02/05/2024: 51/56 Goal status: IN PROGRESS  4.  Patient will increase 10 meter walk test to >1.59m/s using LRAD as to improve gait speed for better community ambulation and to reduce fall risk. Baseline: 0.32m/s  02/05/2024: Average Normal speed: 0.814 m/s & Average Fast speed: 1.065 m/s, no AD, independently  Goal status: IN PROGRESS  5.  Patient will increase Functional Gait Assessment (FGA) score to >20/30 as to reduce fall risk and improve dynamic gait safety with community ambulation.  Baseline: 15/30 02/05/2024: 16/30 Goal status: IN  PROGRESS  ASSESSMENT:  CLINICAL IMPRESSION:   Patient is a 73 y.o. female who was seen today for physical therapy treatment focused on impaired balance as well as B LE functional strength deficits. Patient reports feeling discouraged due to her overall decline in her functional mobility level the past 2 years, which is impacting her participation in meaningful tasks in her yard. Patient reports having a few episodes of imbalance during higher level community tasks such as navigating bleachers at her grandson's sporting event. Therapy session transitioned focus today on higher level dynamic balance and dynamic gait tasks to further challenger her balance and demonstrate her strength improvements. During obstacle navigation, patient demonstrates improving hip flexion strength to clear targets with decreased compensatory movement patterns of hip IR and knee flexion. Patient will benefit from continued focus on dynamic balance with addition of functional strength training as appropriate. The pt will benefit from further skilled PT to improve positional and movement evoked dizziness and imbalance as well as B LE strength deficits in order to return pt to PLOF and increase QOL.    OBJECTIVE IMPAIRMENTS: Abnormal gait, cardiopulmonary status limiting activity, decreased activity tolerance, decreased balance, decreased coordination, decreased endurance, decreased knowledge of condition, decreased knowledge of use of DME, decreased mobility, difficulty walking, decreased strength, and dizziness.   ACTIVITY LIMITATIONS: carrying, lifting, bending, standing, squatting, transfers, bed mobility, bathing, toileting, dressing, reach over head, hygiene/grooming, and locomotion level  PARTICIPATION LIMITATIONS: meal prep, cleaning, laundry, shopping, community activity, and yard work  PERSONAL FACTORS: Age, Time since onset of injury/illness/exacerbation, and 3+ comorbidities: Arthritis, clotting disorder, GERD, IBS, L  TKA, spastic bladder are also affecting patient's functional outcome.   REHAB POTENTIAL: Good  CLINICAL DECISION MAKING: Evolving/moderate complexity  EVALUATION COMPLEXITY: Moderate   PLAN:  PT FREQUENCY: 1-2x/week  PT DURATION: 8 weeks   PLANNED INTERVENTIONS: [id number]- PT Re-evaluation, 97750- Physical Performance Testing, 97110-Therapeutic exercises, 97530- Therapeutic activity, [id number]- Neuromuscular re-education, 97535- Self Care, [id number]- Manual therapy, [id number]- Gait training, [phone]- Orthotic Initial, [phone]- Orthotic/Prosthetic subsequent, [phone]- Canalith repositioning, Patient/Family education, Balance training, Stair training, Taping, Joint mobilization, Spinal mobilization, Vestibular training, Visual/preceptual remediation/compensation, DME instructions, Cryotherapy, Moist heat, and Biofeedback  PLAN FOR UPCOMING SESSIONS:  - B LE functional strengthening:  - Nustep   - gait on treadmill, progress to uphill (monitor vitals before and after) to address hip flexor weakness  - reciprocal stair navigation for strengthening  - standing foot taps to step - continue 3lb+ AW resistance as appropriate  - leg press machine  *continue focus on hip external rotation and hip abductor strengthening  - progress to floor transfer training - dynamic balance & gait  - vertical head turns  - backwards gait   - obstacle navigation  - tandem > SLS -  perform positional vestibular testing with goggles if pt reports onset of positional vertigo symptoms again    Terricka Onofrio, PT, DPT, NCS, CSRS Physical Therapist - [facility]  [facility]  1:36 PM 03/05/24

## 2024-03-09 ENCOUNTER — Ambulatory Visit

## 2024-03-09 DIAGNOSIS — R42 Dizziness and giddiness: Secondary | ICD-10-CM | POA: Diagnosis not present

## 2024-03-09 DIAGNOSIS — R29898 Other symptoms and signs involving the musculoskeletal system: Secondary | ICD-10-CM | POA: Diagnosis not present

## 2024-03-09 DIAGNOSIS — M6281 Muscle weakness (generalized): Secondary | ICD-10-CM

## 2024-03-09 DIAGNOSIS — R2681 Unsteadiness on feet: Secondary | ICD-10-CM

## 2024-03-09 DIAGNOSIS — R296 Repeated falls: Secondary | ICD-10-CM | POA: Diagnosis not present

## 2024-03-09 NOTE — Therapy (Signed)
 OUTPATIENT PHYSICAL THERAPY TREATMENT  Patient Name: Jennifer Frank MRN: [id number] DOB:1950/05/25, 73 y.o., female Today's Date: 03/09/2024  END OF SESSION:    PT End of Session - 03/09/24 1452     Visit Number 17    Number of Visits 40    Date for Recertification  04/30/24    Authorization Type [employer]BS Medicare    Progress Note Due on Visit 20    PT Start Time 1017    PT Stop Time 1057    PT Time Calculation (min) 40 min    Equipment Utilized During Treatment Gait belt    Activity Tolerance Patient tolerated treatment well;No increased pain    Behavior During Therapy WFL for tasks assessed/performed           Past Medical History:  Diagnosis Date   Arthritis    Clotting disorder    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   Fundic gland polyps of stomach, benign    GERD (gastroesophageal reflux disease)    past hx    Hearing loss    IBS (irritable bowel syndrome)    Personal history of colonic polyps - adenoma 09/22/2013   PONV (postoperative nausea and vomiting)    Reflux    Past Surgical History:  Procedure Laterality Date   HIP ARTHROSCOPY Left 1997   KNEE ARTHROSCOPY Left 1996   KNEE ARTHROSCOPY Right    05-2016 same time as left knee replacement    POLYPECTOMY     TONSILLECTOMY AND ADENOIDECTOMY  1957   TOTAL KNEE ARTHROPLASTY Bilateral 05/27/2016   Procedure: LEFT TOTAL KNEE ARTHROPLASTY WITH RIGHT KNEE ARTHROSCOPY;  Surgeon: Dempsey Sensor, MD;  Location: [facility] OR;  Service: Orthopedics;  Laterality: Bilateral;   UPPER GASTROINTESTINAL ENDOSCOPY     WISDOM TOOTH EXTRACTION     Patient Active Problem List   Diagnosis Date Noted   Syncope 09/14/2023   Other social stressor 07/09/2023   Insomnia 07/09/2023   Back pain 03/12/2023   Skin irritation 03/12/2023   Tremor 09/08/2022   Osteopenia 02/11/2022   SOB (shortness of breath) 05/27/2021   Aortic atherosclerosis 03/12/2020   Medicare annual wellness visit, subsequent 01/10/2019   Advance care planning  12/14/2017   Urinary incontinence 12/14/2017   Primary osteoarthritis of left knee 05/26/2016   Health care maintenance 04/22/2016   Hearing loss 04/22/2014   Varicose veins of bilateral lower extremities with other complications 01/25/2013   Edema 01/25/2013   Hyperlipidemia 02/16/2007   GERD 02/16/2007    PCP: [name], MD REFERRING PROVIDER: [name], MD   REFERRING DIAG: R42 (ICD-10-CM) - Dizziness   THERAPY DIAG:  Muscle weakness (generalized)  Dizziness and giddiness  Unsteadiness on feet  ONSET DATE: Around February 2025  Rationale for Evaluation and Treatment: Rehabilitation  SUBJECTIVE:   SUBJECTIVE STATEMENT: Pt still having periodic dizziness during tail end of her longer walking.    PERTINENT HISTORY: Arthritis, clotting disorder, GERD, IBS, L TKA, spastic bladder.  From Initial Eval: Pt states 2 weeks ago her PCP, Dr. Cleatus, referred her to cardiology and they did an EKG showing normal sinus rhythm. States she did wear heart monitor for 7 days with potential abnormal rhythm at night of tachycardia. Pt states cardiologist felt she had vertigo and referred her to ENT. Pt states a few years ago she was told she had loss of hearing. Pt states she has learned to lip read really well since then while she saves up money to afford hearing aides. States  she visited ENT and had debridement of her ears and they confirmed she needs hearing aides. Pt states ENT didn't see vertigo and referred her to vestibular rehab. Pt states she has frequent falls and fell this morning when getting up from seated position with L LOB. States she often falls to her LEFT, but sometimes face forward. States when she stands up she has to stand for a little while because if she moves too quick everything is revolving/spinning and she gets nauseous. Pt states last year or beginning of this year she had EMG and MRI, then was referred to PT for strength training. Pt states imaging found  lumbar spine has bulging discs and that the physical therapist at the time discontinued care due to concern for exacerbating her lumbar spine symptoms. Pt states she did her exercises for a little while, but then gradually stopped doing them. Pt states she uses rollator when her husband is not with her for security and to have seat in tow. Pt states she has sudden onset anxious feeling with no known provocation and then she starts pushing AD too far out in front of her causing instability.  Pt says she has tremors in R hand and she was assessed for Parkinson's due to having family hx on her dad's side. Pt states she saw Neurologist last year and they said she didn't have Parkinson's. Pt also reports having quivering in R LE. Pt reports this morning she had some numbness down her R UE down to her middle finger, but it is gone now. Pt states if she stands with both of her hands up over her head she will blackout and pass out because she was doing a task overhead years ago and this happened. States she can raise 1 arm overhead and she is OK.  PAIN:  Are you having pain? No  PRECAUTIONS: Fall  WEIGHT BEARING RESTRICTIONS: No  FALLS: Has patient fallen in last 6 months? Yes. Number of falls 10+ with pt having a fall this morning  LIVING ENVIRONMENT: Lives with: lives with their family and lives with their spouse Lives in: House/apartment Stairs: need to ask Has following equipment at home: Vannie - 4 wheeled, which pt uses if she doesn't have her husband's support  PLOF: Independent with basic ADLs, Independent with household mobility without device, Independent with community mobility without device, and Independent with homemaking with ambulation  PATIENT GOALS: improve dizziness, decrease number of falls, improve balance  OBJECTIVE:  Note: Objective measures were completed at Evaluation unless otherwise noted.  DIAGNOSTIC FINDINGS:   EXAM: MRI HEAD WITHOUT AND WITH  CONTRAST TECHNIQUE: Multiplanar, multiecho pulse sequences of the brain and surrounding structures were obtained without and with intravenous contrast. CONTRAST:  10 mL of Vueway  IV COMPARISON:  CT head 05/30/2000 FINDINGS: Brain: No acute infarction, hemorrhage, hydrocephalus, extra-axial collection or mass lesion. No abnormal enhancement. Dedicated imaging of the IAC's was performed. The visualized seventh and eighth cranial nerves are unremarkable without abnormal enhancement or mass. Normal CSF signal within the labyrinth bilaterally. Vascular: Normal flow voids. Skull and upper cervical spine: Normal marrow signal. Sinuses/Orbits: Clear sinuses.  No acute orbital findings. IMPRESSION: No evidence of acute abnormality or IAC mass. Electronically Signed   By: Gilmore GORMAN Molt M.D.   On: 01/17/2024 11:21  EXAM: LUMBAR SPINE - COMPLETE 4+ VIEW COMPARISON:  MRI of lumbar spine December 06, 2022 FINDINGS: There is no evidence of lumbar spine fracture. Alignment is normal. Mild narrow intervertebral spaces and mild anterior spurring  are identified in the upper lumbar spine. Bilateral hip replacements are noted. Minimal facet joint sclerosis are noted in the mid to lower lumbar spine.   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  Electronically Signed   By: Craig Farr M.D.   On: 09/18/2023 14:52  COGNITION: Overall cognitive status: Within functional limits for tasks assessed   SENSATION: Not tested  EDEMA:  Not formally measured, but has pitting edema in bilateral lower legs (pt reports being on fluid pill) Pt states she usually wears compression stockings 2-3x/week (not wearing today)  MUSCLE TONE:  Not formally assessed  DTRs:  Achilles: need to assess  POSTURE:  rounded shoulders, forward head, and posterior pelvic tilt  Cervical ROM:    Active Not formally measured, but observation of movement noted A/PROM (deg) eval  Flexion    Extension Decreased from normal   Right lateral flexion   Left lateral flexion   Right rotation   Left rotation Decreased compared to R  (Blank rows = not tested)  STRENGTH: Not formally assessed  LOWER EXTREMITY MMT:   MMT  Need to be assessed  Right  12/26/2023  Right  03/09/24 Left  12/26/2023 Left   03/09/24  Hip flexion 2- 4/5^ 2- 4+/5^  Hip extension       Hip abduction 4+ 4-/5 4+ 3+/5  Hip adduction 4+ N/a 4+ N/a  Hip internal rotation  5/5  4+/5  Hip external rotation  3+/5  4-/5  Knee flexion 4+ 5/5 3+ 5/5  Knee extension 4+ 5/5 4 5/5  Ankle dorsiflexion 4+ 5/5 4+ 5/5  Ankle plantarflexion 4  4   (Blank rows = not tested)  *reports pain in R thigh/quad muscle and R anterior hip  OTHOSTATICS:  12/23/23:  116/64 68bpm supine (no symptoms)  118/61 67bpm seated (no symptoms)  116/65 71bpm standing (no symptoms)  114/68 70bpm standing x 60 sec (no symptoms)  (Negative) *also negative with cardiology   03/09/24: 125/60mmHg 73bpm (supine)  121/39mmHg 72bpm (sitting)  110/45mmHg 81bpm (standing)  119/69 mmHg 81bpm Standing at 1 minutes                                                                                                                            TREATMENT DATE: 03/09/2024 -assessment of orthostatic vitals again, repeat vitals after 962ft AMB testing: 16m2s no device, no LOB *no symptoms today  -MMT assessment repeat -education on taking orthostatic vitals at home on symptomatic days   PATIENT EDUCATION: Education details: discussed orthostatic BP again.  Person educated: Patient Education method: Explanation and Handouts Education comprehension: verbalized understanding and needs further education  HOME EXERCISE PROGRAM:  Access Code: [email or web address] URL: [email or web address] Date: 02/19/2024 Prepared by: Connell Kiss  Exercises - Standing March with Counter Support  - 1 x daily - 7 x weekly - 2 sets - 10-15 reps - Narrow  Stance with Head Nods and Counter Support  - 1 x daily - 7  x weekly - 2 sets - 10 reps - Supine Heel Slide with Strap  - 2 x daily - 7 x weekly - 2 sets - 5 reps - Supine Bridge  - 1 x daily - 7 x weekly - 3 sets - 15 reps - Resisted Sit-to-Stand With Dumbbell at Chest  - 1 x daily - 3-5 x weekly - 2-3 sets - 10 reps - Hooklying Isometric Clamshell  - 1 x daily - 3-5 x weekly - 2-3 sets - 10 reps *add in hoolkying marching next visit [facility]   Prior Vestibular Exercises (removed from [facility] on 02/19/24)  GOALS: Goals reviewed with patient? Yes  SHORT TERM GOALS: Target date: 04/02/2024  Patient will be independent in home exercise program to improve gaze stabilization and/or mobility for improved functional independence with ADLs while experiencing decreased dizziness symptoms. Baseline: initiated on 12/23/2023 02/05/2024: last updated on 01/13/2024 Goal status: IN PROGRESS  LONG TERM GOALS: Target date: 04/30/2024  Patient will reduce dizziness handicap inventory [facility]) score to <36, for less dizziness with ADLs and increased safety with home and work tasks.  Baseline: 48% 02/05/2024: 14% Goal status: IN PROGRESS  2.  Patient will deny any falls over past 4 weeks to demonstrate improved balance and safe independence with functional mobility.  Baseline: reports frequent falls with 1 on day of initial eval 02/05/2024: 0 falls in past 4 weeks Goal status: MET and CONTINUE  3.  Patient will increase Berg Balance score to > 51/56 to demonstrate improved balance and decreased fall risk during functional activities and ADLs.  Baseline: 01/15/2024: 49/56 02/05/2024: 51/56 Goal status: IN PROGRESS  4.  Patient will increase 10 meter walk test to >1.74m/s using LRAD as to improve gait speed for better community ambulation and to reduce fall risk. Baseline: 0.40m/s  02/05/2024: Average Normal speed: 0.814 m/s & Average Fast speed: 1.065 m/s, no AD, independently  Goal status: IN PROGRESS  5.  Patient  will increase Functional Gait Assessment (FGA) score to >20/30 as to reduce fall risk and improve dynamic gait safety with community ambulation.  Baseline: 15/30 02/05/2024: 16/30 Goal status: IN PROGRESS  ASSESSMENT:  CLINICAL IMPRESSION:   Pt stil focusing largely on strength on balance. Took time to more closely assess orthostatic vitals given her reports of lightheadedness and balance with longer distances,- cannot r/o diastolic HF issue given know LEE issue, will review chart/cardiology notes. No orthotatic symptoms today or changes with MAB. MMT repeated, rvealing of continued significant weakness sportaically about bilat hips, however, other testingimproved since testing 10weeks prior. Patient will benefit from continued focus on dynamic balance with addition of functional strength training as appropriate. The pt will benefit from further skilled PT to improve positional and movement evoked dizziness and imbalance as well as B LE strength deficits in order to return pt to PLOF and increase QOL.    OBJECTIVE IMPAIRMENTS: Abnormal gait, cardiopulmonary status limiting activity, decreased activity tolerance, decreased balance, decreased coordination, decreased endurance, decreased knowledge of condition, decreased knowledge of use of DME, decreased mobility, difficulty walking, decreased strength, and dizziness.   ACTIVITY LIMITATIONS: carrying, lifting, bending, standing, squatting, transfers, bed mobility, bathing, toileting, dressing, reach over head, hygiene/grooming, and locomotion level  PARTICIPATION LIMITATIONS: meal prep, cleaning, laundry, shopping, community activity, and yard work  PERSONAL FACTORS: Age, Time since onset of injury/illness/exacerbation, and 3+ comorbidities: Arthritis, clotting disorder, GERD, IBS, L TKA, spastic bladder are also affecting patient's functional outcome.   REHAB POTENTIAL: Good  CLINICAL DECISION MAKING: Evolving/moderate complexity  EVALUATION  COMPLEXITY: Moderate   PLAN:  PT FREQUENCY: 1-2x/week PT DURATION: 8 weeks  PLANNED INTERVENTIONS: 97164- PT Re-evaluation, 97750- Physical Performance Testing, 97110-Therapeutic exercises, 97530- Therapeutic activity, [id number]- Neuromuscular re-education, 97535- Self Care, [id number]- Manual therapy, [phone]- Gait training, [phone]- Orthotic Initial, [phone]- Orthotic/Prosthetic subsequent, [phone]- Canalith repositioning, Patient/Family education, Balance training, Stair training, Taping, Joint mobilization, Spinal mobilization, Vestibular training, Visual/preceptual remediation/compensation, DME instructions, Cryotherapy, Moist heat, and Biofeedback  PLAN FOR UPCOMING SESSIONS:  Functional strength and balance, history of orthostatic BP issues that fluctuate, history of vertigo in remission.     2:53 PM, 03/09/24 Peggye JAYSON Linear, PT, DPT Physical Therapist - [place] [facility]  Outpatient Physical Therapy- [facility] [phone]

## 2024-03-11 ENCOUNTER — Encounter: Admitting: Family Medicine

## 2024-03-11 ENCOUNTER — Ambulatory Visit: Admitting: Physical Therapy

## 2024-03-11 DIAGNOSIS — R2681 Unsteadiness on feet: Secondary | ICD-10-CM

## 2024-03-11 DIAGNOSIS — M6281 Muscle weakness (generalized): Secondary | ICD-10-CM

## 2024-03-11 DIAGNOSIS — R29898 Other symptoms and signs involving the musculoskeletal system: Secondary | ICD-10-CM | POA: Diagnosis not present

## 2024-03-11 DIAGNOSIS — R42 Dizziness and giddiness: Secondary | ICD-10-CM

## 2024-03-11 DIAGNOSIS — R296 Repeated falls: Secondary | ICD-10-CM

## 2024-03-11 NOTE — Therapy (Signed)
 OUTPATIENT PHYSICAL THERAPY TREATMENT  Patient Name: Jennifer Frank MRN: [id number] DOB:04-20-1951, 73 y.o., female Today's Date: 03/11/2024  END OF SESSION:     PT End of Session - 03/11/24 1026     Visit Number 18    Number of Visits 40    Date for Recertification  04/30/24    Authorization Type [employer] Medicare    Progress Note Due on Visit 20    PT Start Time 1025    PT Stop Time 1108    PT Time Calculation (min) 43 min    Equipment Utilized During Treatment Gait belt    Activity Tolerance Patient tolerated treatment well;No increased pain    Behavior During Therapy WFL for tasks assessed/performed            Past Medical History:  Diagnosis Date   Arthritis    Clotting disorder    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   Fundic gland polyps of stomach, benign    GERD (gastroesophageal reflux disease)    past hx    Hearing loss    IBS (irritable bowel syndrome)    Personal history of colonic polyps - adenoma 09/22/2013   PONV (postoperative nausea and vomiting)    Reflux    Past Surgical History:  Procedure Laterality Date   HIP ARTHROSCOPY Left 1997   KNEE ARTHROSCOPY Left 1996   KNEE ARTHROSCOPY Right    05-2016 same time as left knee replacement    POLYPECTOMY     TONSILLECTOMY AND ADENOIDECTOMY  1957   TOTAL KNEE ARTHROPLASTY Bilateral 05/27/2016   Procedure: LEFT TOTAL KNEE ARTHROPLASTY WITH RIGHT KNEE ARTHROSCOPY;  Surgeon: Dempsey Sensor, MD;  Location: [facility] OR;  Service: Orthopedics;  Laterality: Bilateral;   UPPER GASTROINTESTINAL ENDOSCOPY     WISDOM TOOTH EXTRACTION     Patient Active Problem List   Diagnosis Date Noted   Syncope 09/14/2023   Other social stressor 07/09/2023   Insomnia 07/09/2023   Back pain 03/12/2023   Skin irritation 03/12/2023   Tremor 09/08/2022   Osteopenia 02/11/2022   SOB (shortness of breath) 05/27/2021   Aortic atherosclerosis 03/12/2020   Medicare annual wellness visit, subsequent 01/10/2019   Advance care  planning 12/14/2017   Urinary incontinence 12/14/2017   Primary osteoarthritis of left knee 05/26/2016   Health care maintenance 04/22/2016   Hearing loss 04/22/2014   Varicose veins of bilateral lower extremities with other complications 01/25/2013   Edema 01/25/2013   Hyperlipidemia 02/16/2007   GERD 02/16/2007    PCP: [name], MD REFERRING PROVIDER: [name], MD   REFERRING DIAG: R42 (ICD-10-CM) - Dizziness   THERAPY DIAG:  Muscle weakness (generalized)  Dizziness and giddiness  Unsteadiness on feet  Weakness of lower extremity, unspecified laterality  Repeated falls  ONSET DATE: Around February 2025  Rationale for Evaluation and Treatment: Rehabilitation  SUBJECTIVE:   SUBJECTIVE STATEMENT:  Patient states her therapy went well last session. Pt states she is considering adding liquid IV to her water to get more sodium intake due to concerns for orthostatic hypotension. Patient states she was told the swelling in her legs is associated with poor circulation; however, she does have a family history of many cardiac diagnoses. Patient states she has lasix  prescribed as needed when she has significant LE swelling. Patient reports having a follow-up with her PCP on Monday, October 27th, and will discuss some of this with him.  Patient states after leaving therapy session last Thursday her and her husband went  to the mall to walk. Patient states after walking into the mall she had to take a seated rest break because she was feeling short of breath. Pt using her rollator for this activity.  Patient states she lost her HEP paperwork and would appreciate reviewing her exercises today to ensure she is doing them correctly.     PERTINENT HISTORY: Arthritis, clotting disorder, GERD, IBS, L TKA, spastic bladder.  From Initial Eval: Pt states 2 weeks ago her PCP, Dr. Cleatus, referred her to cardiology and they did an EKG showing normal sinus rhythm. States she did  wear heart monitor for 7 days with potential abnormal rhythm at night of tachycardia. Pt states cardiologist felt she had vertigo and referred her to ENT. Pt states a few years ago she was told she had loss of hearing. Pt states she has learned to lip read really well since then while she saves up money to afford hearing aides. States she visited ENT and had debridement of her ears and they confirmed she needs hearing aides. Pt states ENT didn't see vertigo and referred her to vestibular rehab. Pt states she has frequent falls and fell this morning when getting up from seated position with L LOB. States she often falls to her LEFT, but sometimes face forward. States when she stands up she has to stand for a little while because if she moves too quick everything is revolving/spinning and she gets nauseous. Pt states last year or beginning of this year she had EMG and MRI, then was referred to PT for strength training. Pt states imaging found lumbar spine has bulging discs and that the physical therapist at the time discontinued care due to concern for exacerbating her lumbar spine symptoms. Pt states she did her exercises for a little while, but then gradually stopped doing them. Pt states she uses rollator when her husband is not with her for security and to have seat in tow. Pt states she has sudden onset anxious feeling with no known provocation and then she starts pushing AD too far out in front of her causing instability.  Pt says she has tremors in R hand and she was assessed for Parkinson's due to having family hx on her dad's side. Pt states she saw Neurologist last year and they said she didn't have Parkinson's. Pt also reports having quivering in R LE. Pt reports this morning she had some numbness down her R UE down to her middle finger, but it is gone now. Pt states if she stands with both of her hands up over her head she will blackout and pass out because she was doing a task overhead years ago and  this happened. States she can raise 1 arm overhead and she is OK.  PAIN:  Are you having pain? No  PRECAUTIONS: Fall  WEIGHT BEARING RESTRICTIONS: No  FALLS: Has patient fallen in last 6 months? Yes. Number of falls 10+ with pt having a fall this morning  LIVING ENVIRONMENT: Lives with: lives with their family and lives with their spouse Lives in: House/apartment Stairs: need to ask Has following equipment at home: Vannie - 4 wheeled, which pt uses if she doesn't have her husband's support  PLOF: Independent with basic ADLs, Independent with household mobility without device, Independent with community mobility without device, and Independent with homemaking with ambulation  PATIENT GOALS: improve dizziness, decrease number of falls, improve balance  OBJECTIVE:  Note: Objective measures were completed at Evaluation unless otherwise noted.  DIAGNOSTIC FINDINGS:  EXAM: MRI HEAD WITHOUT AND WITH CONTRAST TECHNIQUE: Multiplanar, multiecho pulse sequences of the brain and surrounding structures were obtained without and with intravenous contrast. CONTRAST:  10 mL of Vueway  IV COMPARISON:  CT head 05/30/2000 FINDINGS: Brain: No acute infarction, hemorrhage, hydrocephalus, extra-axial collection or mass lesion. No abnormal enhancement. Dedicated imaging of the IAC's was performed. The visualized seventh and eighth cranial nerves are unremarkable without abnormal enhancement or mass. Normal CSF signal within the labyrinth bilaterally. Vascular: Normal flow voids. Skull and upper cervical spine: Normal marrow signal. Sinuses/Orbits: Clear sinuses.  No acute orbital findings. IMPRESSION: No evidence of acute abnormality or IAC mass. Electronically Signed   By: Gilmore GORMAN Molt M.D.   On: 01/17/2024 11:21  EXAM: LUMBAR SPINE - COMPLETE 4+ VIEW COMPARISON:  MRI of lumbar spine December 06, 2022 FINDINGS: There is no evidence of lumbar spine fracture. Alignment is normal. Mild  narrow intervertebral spaces and mild anterior spurring are identified in the upper lumbar spine. Bilateral hip replacements are noted. Minimal facet joint sclerosis are noted in the mid to lower lumbar spine.   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  Electronically Signed   By: Craig Farr M.D.   On: 09/18/2023 14:52  COGNITION: Overall cognitive status: Within functional limits for tasks assessed   SENSATION: Not tested  EDEMA:  Not formally measured, but has pitting edema in bilateral lower legs (pt reports being on fluid pill) Pt states she usually wears compression stockings 2-3x/week (not wearing today)  MUSCLE TONE:  Not formally assessed  DTRs:  Achilles: need to assess  POSTURE:  rounded shoulders, forward head, and posterior pelvic tilt  Cervical ROM:    Active Not formally measured, but observation of movement noted A/PROM (deg) eval  Flexion   Extension Decreased from normal   Right lateral flexion   Left lateral flexion   Right rotation   Left rotation Decreased compared to R  (Blank rows = not tested)  STRENGTH: Not formally assessed  LOWER EXTREMITY MMT:   MMT  Need to be assessed  Right  12/26/2023  Right  03/09/24 Left  12/26/2023 Left   03/09/24  Hip flexion 2- 4/5^ 2- 4+/5^  Hip extension       Hip abduction 4+ 4-/5 4+ 3+/5  Hip adduction 4+ N/a 4+ N/a  Hip internal rotation  5/5  4+/5  Hip external rotation  3+/5  4-/5  Knee flexion 4+ 5/5 3+ 5/5  Knee extension 4+ 5/5 4 5/5  Ankle dorsiflexion 4+ 5/5 4+ 5/5  Ankle plantarflexion 4  4   (Blank rows = not tested)  *reports pain in R thigh/quad muscle and R anterior hip  OTHOSTATICS:  12/23/23:  116/64 68bpm supine (no symptoms)  118/61 67bpm seated (no symptoms)  116/65 71bpm standing (no symptoms)  114/68 70bpm standing x 60 sec (no symptoms)  (Negative) *also negative with cardiology   03/09/24: 125/57mmHg 73bpm (supine)  121/59mmHg 72bpm  (sitting)  110/26mmHg 81bpm (standing)  119/69 mmHg 81bpm Standing at 1 minutes  TREATMENT DATE: 03/11/2024  Unless otherwise stated, CGA/SBA was provided and gait belt donned in order to ensure pt safety throughout session.  Therapy session focused on reviewing pt's HEP to assess her form/technique with the exercises and to assess if progression/upgrading is appropriate:  Supine heel slides with strap assist X8reps per LE Pt continues to report fairly significant pulling pain in R quad/groin area  Pt demonstrates significant improvement in her strength to perform this exercise today compared to when initially prescribed it on 02/19/24. Kept exercise on her HEP for pt to progress to Supine unilateral hip/knee flexion with her foot on swiss ball  X15reps per LE Pt reports still feeling activation of her hip flexors, but without pain compared to when using strap to assist Added to HEP Supine bridge X15reps Cuing to avoid excessive hip adduction Progressed to supine bridge with GTB resistance around knees to increase hip abductor activation 2x15 reps Pt has GTB at home Updated HEP to this exercise Supine hooklying hip abduction against GTB resistance 2x15reps Cuing for controlled movement Sit to stands, no UE support X12reps Educated pt to place a dense, folded up blanket in her seat at home to raise the chair height Pt not ready to progress to resisted sit to stands at this time based on her chair height at home; therefore, updated HEP appropriately Cuing for increased anterior trunk lean/weight shift Standing marches with unilateral UE support Performed facing sideways at counter so she can rub her knee along the counter to avoid compensation with excessive hip IR X8reps per LE Standing NBOS head nods X10reps This was challenging with pt having minor R  posterior LOB Educated to ensure she is placing her hands down in the sink for balance safety with this exercise at home   Provided updated HEP printout. Educated patient to ensure she reads the exercise instructions as therapist placed specific information to ensure she performs them safely and with proper form at home. Educated pt on option to download Medbridge Go App on her phone to follow the exercises on there.   PATIENT EDUCATION: Education details: discussed orthostatic BP again.  Person educated: Patient Education method: Explanation and Handouts Education comprehension: verbalized understanding and needs further education  HOME EXERCISE PROGRAM:  Access Code: [email or web address] URL: [email or web address] Date: 03/11/2024 Prepared by: Connell Kiss  Exercises - Supine Single Leg Hip and Knee Flexion ROM with Swiss Ball  - 2 x daily - 5 x weekly - 2 sets - 15 reps - Supine Heel Slide with Strap  - 1 x daily - 5 x weekly - 2 sets - 5 reps - Supine Bridge with Resistance Band  - 1 x daily - 5 x weekly - 3 sets - 15 reps - Hooklying Isometric Clamshell  - 1 x daily - 5 x weekly - 3 sets - 15 reps - Sit to Stand with Arms Crossed  - 1 x daily - 5 x weekly - 2 sets - 10 reps - Standing March with Counter Support  - 1 x daily - 7 x weekly - 2 sets - 10-15 reps - Narrow Stance with Head Nods and Counter Support  - 1 x daily - 7 x weekly - 2 sets - 10 reps  Prior Vestibular Exercises (removed from HEP on 02/19/24)  GOALS: Goals reviewed with patient? Yes  SHORT TERM GOALS: Target date: 04/02/2024  Patient will be independent in home exercise program to improve gaze stabilization and/or mobility for improved functional independence with ADLs while experiencing decreased  dizziness symptoms. Baseline: initiated on 12/23/2023 02/05/2024: last updated on 01/13/2024 Goal status: IN PROGRESS  LONG TERM GOALS: Target date: 04/30/2024  Patient will reduce dizziness handicap inventory  [facility]) score to <36, for less dizziness with ADLs and increased safety with home and work tasks.  Baseline: 48% 02/05/2024: 14% Goal status: MET  2.  Patient will deny any falls over past 4 weeks to demonstrate improved balance and safe independence with functional mobility.  Baseline: reports frequent falls with 1 on day of initial eval 02/05/2024: 0 falls in past 4 weeks Goal status: MET and CONTINUE  3.  Patient will increase Berg Balance score to > 51/56 to demonstrate improved balance and decreased fall risk during functional activities and ADLs.  Baseline: 01/15/2024: 49/56 02/05/2024: 51/56 Goal status: IN PROGRESS  4.  Patient will increase 10 meter walk test to >1.81m/s using LRAD as to improve gait speed for better community ambulation and to reduce fall risk. Baseline: 0.29m/s  02/05/2024: Average Normal speed: 0.814 m/s & Average Fast speed: 1.065 m/s, no AD, independently  Goal status: IN PROGRESS  5.  Patient will increase Functional Gait Assessment (FGA) score to >20/30 as to reduce fall risk and improve dynamic gait safety with community ambulation.  Baseline: 15/30 02/05/2024: 16/30 Goal status: IN PROGRESS  ASSESSMENT:  CLINICAL IMPRESSION:   Therapy session focused on review of HEP to ensure proper form/technique and to modify exercises as appropriate. Patient demonstrates significant improvement in hip flexor stretch during supine heel slide with strap; however, continues to report significant pulling pain in R quad. Therefore, educated pt on option to use Swiss ball (pt has at home) to perform hip/knee flexion and increase repetition for strengthening until pt can progress back to use of strap. Patient continues to demo challenge rising to stand from a standard chair height without use of UEs; therefore, educated on use of folded up blanket to elevate seat height until she can successful perform 2sets of 10reps without UE support consistently. Patient also continues to demo  balance deficits with minor postural sway during NBOS head nods with therapist reinforcing importance of balance training daily via this exercise. Patient will benefit from continued focus on dynamic balance with addition of functional strength training as appropriate. The pt will benefit from further skilled PT to improve positional and movement evoked dizziness and imbalance as well as B LE strength deficits in order to return pt to PLOF and increase QOL.    OBJECTIVE IMPAIRMENTS: Abnormal gait, cardiopulmonary status limiting activity, decreased activity tolerance, decreased balance, decreased coordination, decreased endurance, decreased knowledge of condition, decreased knowledge of use of DME, decreased mobility, difficulty walking, decreased strength, and dizziness.   ACTIVITY LIMITATIONS: carrying, lifting, bending, standing, squatting, transfers, bed mobility, bathing, toileting, dressing, reach over head, hygiene/grooming, and locomotion level  PARTICIPATION LIMITATIONS: meal prep, cleaning, laundry, shopping, community activity, and yard work  PERSONAL FACTORS: Age, Time since onset of injury/illness/exacerbation, and 3+ comorbidities: Arthritis, clotting disorder, GERD, IBS, L TKA, spastic bladder are also affecting patient's functional outcome.   REHAB POTENTIAL: Good  CLINICAL DECISION MAKING: Evolving/moderate complexity  EVALUATION COMPLEXITY: Moderate   PLAN:  PT FREQUENCY: 1-2x/week PT DURATION: 8 weeks  PLANNED INTERVENTIONS: 97164- PT Re-evaluation, 97750- Physical Performance Testing, 97110-Therapeutic exercises, 97530- Therapeutic activity, [id number]- Neuromuscular re-education, 97535- Self Care, [id number]- Manual therapy, [id number]- Gait training, [id number]- Orthotic Initial, [phone]- Orthotic/Prosthetic subsequent, [phone]- Canalith repositioning, Patient/Family education, Balance training, Stair training, Taping, Joint mobilization, Spinal mobilization, Vestibular training,  Visual/preceptual remediation/compensation, DME instructions,  Cryotherapy, Moist heat, and Biofeedback  PLAN FOR UPCOMING SESSIONS:  Follow-up on new HEP printout - App access? Reinforce importance of balance exercise Circuit training with dynamic balance and B LE functional strengthening Functional strength and balance, history of orthostatic BP issues that fluctuate, history of vertigo in remission.      Connell Kiss, PT, DPT, NCS, CSRS Physical Therapist - [place]  [facility]  11:10 AM 03/11/24

## 2024-03-15 ENCOUNTER — Encounter: Payer: Self-pay | Admitting: Family Medicine

## 2024-03-15 ENCOUNTER — Ambulatory Visit: Admitting: Family Medicine

## 2024-03-15 VITALS — BP 124/76 | HR 65 | Temp 98.3°F | Ht 68.5 in | Wt 207.1 lb

## 2024-03-15 DIAGNOSIS — E785 Hyperlipidemia, unspecified: Secondary | ICD-10-CM | POA: Diagnosis not present

## 2024-03-15 DIAGNOSIS — G4700 Insomnia, unspecified: Secondary | ICD-10-CM

## 2024-03-15 DIAGNOSIS — R32 Unspecified urinary incontinence: Secondary | ICD-10-CM | POA: Diagnosis not present

## 2024-03-15 DIAGNOSIS — Z0000 Encounter for general adult medical examination without abnormal findings: Secondary | ICD-10-CM

## 2024-03-15 DIAGNOSIS — K219 Gastro-esophageal reflux disease without esophagitis: Secondary | ICD-10-CM | POA: Diagnosis not present

## 2024-03-15 DIAGNOSIS — Z23 Encounter for immunization: Secondary | ICD-10-CM

## 2024-03-15 DIAGNOSIS — R55 Syncope and collapse: Secondary | ICD-10-CM

## 2024-03-15 DIAGNOSIS — E559 Vitamin D deficiency, unspecified: Secondary | ICD-10-CM

## 2024-03-15 DIAGNOSIS — Z7189 Other specified counseling: Secondary | ICD-10-CM

## 2024-03-15 LAB — CBC WITH DIFFERENTIAL/PLATELET
Basophils Absolute: 0.1 K/uL (ref 0.0–0.1)
Basophils Relative: 0.7 % (ref 0.0–3.0)
Eosinophils Absolute: 0.2 K/uL (ref 0.0–0.7)
Eosinophils Relative: 2.2 % (ref 0.0–5.0)
HCT: 41 % (ref 36.0–46.0)
Hemoglobin: 13.4 g/dL (ref 12.0–15.0)
Lymphocytes Relative: 22.2 % (ref 12.0–46.0)
Lymphs Abs: 1.8 K/uL (ref 0.7–4.0)
MCHC: 32.7 g/dL (ref 30.0–36.0)
MCV: 85.6 fl (ref 78.0–100.0)
Monocytes Absolute: 0.6 K/uL (ref 0.1–1.0)
Monocytes Relative: 6.9 % (ref 3.0–12.0)
Neutro Abs: 5.6 K/uL (ref 1.4–7.7)
Neutrophils Relative %: 68 % (ref 43.0–77.0)
Platelets: 283 K/uL (ref 150.0–400.0)
RBC: 4.79 Mil/uL (ref 3.87–5.11)
RDW: 13.9 % (ref 11.5–15.5)
WBC: 8.2 K/uL (ref 4.0–10.5)

## 2024-03-15 LAB — LIPID PANEL
Cholesterol: 243 mg/dL — ABNORMAL HIGH (ref 0–200)
HDL: 60.2 mg/dL (ref >39.00)
LDL Cholesterol: 151 mg/dL — ABNORMAL HIGH (ref 0–99)
NonHDL: 182.44
Total CHOL/HDL Ratio: 4
Triglycerides: 159 mg/dL — ABNORMAL HIGH (ref 0.0–149.0)
VLDL: 31.8 mg/dL (ref 0.0–40.0)

## 2024-03-15 LAB — COMPREHENSIVE METABOLIC PANEL WITH GFR
ALT: 11 U/L (ref 0–35)
AST: 13 U/L (ref 0–37)
Albumin: 4.2 g/dL (ref 3.5–5.2)
Alkaline Phosphatase: 76 U/L (ref 39–117)
BUN: 10 mg/dL (ref 6–23)
CO2: 28 meq/L (ref 19–32)
Calcium: 9.1 mg/dL (ref 8.4–10.5)
Chloride: 104 meq/L (ref 96–112)
Creatinine, Ser: 0.84 mg/dL (ref 0.40–1.20)
GFR: 69.02 mL/min (ref >60.00)
Glucose, Bld: 80 mg/dL (ref 70–99)
Potassium: 4.3 meq/L (ref 3.5–5.1)
Sodium: 139 meq/L (ref 135–145)
Total Bilirubin: 0.3 mg/dL (ref 0.2–1.2)
Total Protein: 7.2 g/dL (ref 6.0–8.3)

## 2024-03-15 LAB — VITAMIN D 25 HYDROXY (VIT D DEFICIENCY, FRACTURES): VITD: 22.12 ng/mL — ABNORMAL LOW (ref 30.00–100.00)

## 2024-03-15 MED ORDER — PANTOPRAZOLE SODIUM 20 MG PO TBEC: 20.0000 mg | DELAYED_RELEASE_TABLET | Freq: Every day | ORAL | 5 refills | Status: AC

## 2024-03-15 MED ORDER — BETAMETHASONE VALERATE 0.1 % EX CREA: TOPICAL_CREAM | Freq: Every day | CUTANEOUS | 3 refills | Status: AC | PRN

## 2024-03-15 NOTE — Patient Instructions (Addendum)
 Please check with the front about getting your records from gynecology for the last year, especially your bone density and mammogram report.   Please call GI at [phone] if they don't call you first.   Go to the lab on the way out.   If you have mychart we'll likely use that to update you.    You could try stopping myrbetriq  and see how you feel.   Try restarting protonix  and see if the voice changes get better.   Keep drinking plenty of fluid and add some extra salt to food.  Update me as needed.    Take care.  Glad to see you.
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
## 2024-03-16 ENCOUNTER — Telehealth: Payer: Self-pay | Admitting: Physical Therapy

## 2024-03-16 ENCOUNTER — Ambulatory Visit: Admitting: Physical Therapy

## 2024-03-16 NOTE — Therapy (Incomplete)
 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   Fundic gland polyps of stomach, benign    GERD (gastroesophageal reflux disease)    past hx    Hearing loss    IBS (irritable bowel syndrome)    Personal history of colonic polyps - adenoma 09/22/2013   PONV (postoperative nausea and vomiting)    Reflux    Past Surgical History:  Procedure Laterality Date   HIP ARTHROSCOPY Left 1997   KNEE ARTHROSCOPY Left 1996   KNEE ARTHROSCOPY Right    05-2016 same time as left knee replacement    POLYPECTOMY     TONSILLECTOMY AND ADENOIDECTOMY  1957   TOTAL KNEE ARTHROPLASTY Bilateral 05/27/2016   Procedure: LEFT TOTAL KNEE ARTHROPLASTY WITH RIGHT KNEE ARTHROSCOPY;  Surgeon: Dempsey Sensor, MD;  Location: [facility] OR;  Service: Orthopedics;  Laterality: Bilateral;   UPPER GASTROINTESTINAL ENDOSCOPY     WISDOM TOOTH EXTRACTION     Patient Active Problem List   Diagnosis Date Noted   Syncope 09/14/2023   Other social stressor 07/09/2023   Insomnia 07/09/2023   Back pain 03/12/2023   Skin irritation 03/12/2023   Tremor 09/08/2022   Osteopenia 02/11/2022   SOB (shortness of breath) 05/27/2021   Aortic atherosclerosis 03/12/2020   Medicare annual wellness visit, subsequent 01/10/2019   Advance care planning 12/14/2017   Urinary incontinence 12/14/2017   Primary osteoarthritis of left knee 05/26/2016   Health care maintenance 04/22/2016   Hearing loss 04/22/2014   Varicose veins of bilateral lower extremities with other complications 01/25/2013   Edema 01/25/2013   Hyperlipidemia 02/16/2007   GERD 02/16/2007    PCP: [name], MD REFERRING PROVIDER: [name], MD   REFERRING DIAG: R42 (ICD-10-CM) - Dizziness   THERAPY DIAG:  *** No diagnosis found.  ONSET DATE: Around February 2025  Rationale for Evaluation and Treatment: Rehabilitation  SUBJECTIVE:   SUBJECTIVE STATEMENT: *** Patient states her therapy went well last session. Pt states she is considering adding liquid IV to her water to get more sodium intake due to concerns for orthostatic hypotension. Patient states she was told the swelling in her legs is associated with poor circulation; however, she does have a family history of many cardiac diagnoses. Patient states she has lasix  prescribed as needed when she has significant LE swelling. Patient reports having a follow-up with her PCP on Monday, October 27th, and will discuss some of this with him.  Patient states after leaving therapy session last Thursday her and her husband went to the mall to walk. Patient states after walking into the mall she had to take a seated rest break because she was feeling short of breath. Pt using her rollator for this activity.  Patient states she lost her HEP paperwork and would appreciate reviewing her exercises today to ensure she is doing them correctly.     PERTINENT HISTORY: Arthritis, clotting disorder, GERD, IBS, L TKA, spastic bladder.  From Initial Eval: Pt states 2 weeks ago her PCP, Dr. Cleatus, referred her to cardiology and they did an EKG showing normal sinus rhythm. States she did wear heart monitor for 7 days with potential abnormal rhythm at night of tachycardia. Pt states cardiologist felt she had vertigo and referred  her to ENT. Pt states a few years ago she was told she had loss of hearing. Pt states she has learned to lip read really well since then while she saves up money to afford hearing aides. States she visited ENT and had debridement of her ears and they confirmed she needs hearing aides. Pt states ENT didn't see vertigo and referred her to vestibular rehab. Pt states she has frequent falls and fell this morning when getting up from seated  position with L LOB. States she often falls to her LEFT, but sometimes face forward. States when she stands up she has to stand for a little while because if she moves too quick everything is revolving/spinning and she gets nauseous. Pt states last year or beginning of this year she had EMG and MRI, then was referred to PT for strength training. Pt states imaging found lumbar spine has bulging discs and that the physical therapist at the time discontinued care due to concern for exacerbating her lumbar spine symptoms. Pt states she did her exercises for a little while, but then gradually stopped doing them. Pt states she uses rollator when her husband is not with her for security and to have seat in tow. Pt states she has sudden onset anxious feeling with no known provocation and then she starts pushing AD too far out in front of her causing instability.  Pt says she has tremors in R hand and she was assessed for Parkinson's due to having family hx on her dad's side. Pt states she saw Neurologist last year and they said she didn't have Parkinson's. Pt also reports having quivering in R LE. Pt reports this morning she had some numbness down her R UE down to her middle finger, but it is gone now. Pt states if she stands with both of her hands up over her head she will blackout and pass out because she was doing a task overhead years ago and this happened. States she can raise 1 arm overhead and she is OK.  PAIN:  Are you having pain? No  PRECAUTIONS: Fall  WEIGHT BEARING RESTRICTIONS: No  FALLS: Has patient fallen in last 6 months? Yes. Number of falls 10+ with pt having a fall this morning  LIVING ENVIRONMENT: Lives with: lives with their family and lives with their spouse Lives in: House/apartment Stairs: need to ask Has following equipment at home: Vannie - 4 wheeled, which pt uses if she doesn't have her husband's support  PLOF: Independent with basic ADLs, Independent with household mobility  without device, Independent with community mobility without device, and Independent with homemaking with ambulation  PATIENT GOALS: improve dizziness, decrease number of falls, improve balance  OBJECTIVE:  Note: Objective measures were completed at Evaluation unless otherwise noted.  DIAGNOSTIC FINDINGS:   EXAM: MRI HEAD WITHOUT AND WITH CONTRAST TECHNIQUE: Multiplanar, multiecho pulse sequences of the brain and surrounding structures were obtained without and with intravenous contrast. CONTRAST:  10 mL of Vueway  IV COMPARISON:  CT head 05/30/2000 FINDINGS: Brain: No acute infarction, hemorrhage, hydrocephalus, extra-axial collection or mass lesion. No abnormal enhancement. Dedicated imaging of the IAC's was performed. The visualized seventh and eighth cranial nerves are unremarkable without abnormal enhancement or mass. Normal CSF signal within the labyrinth bilaterally. Vascular: Normal flow voids. Skull and upper cervical spine: Normal marrow signal. Sinuses/Orbits: Clear sinuses.  No acute orbital findings. IMPRESSION: No evidence of acute abnormality or IAC mass. Electronically Signed   By: Gilmore GORMAN Molt M.D.   On:  01/17/2024 11:21  EXAM: LUMBAR SPINE - COMPLETE 4+ VIEW COMPARISON:  MRI of lumbar spine December 06, 2022 FINDINGS: There is no evidence of lumbar spine fracture. Alignment is normal. Mild narrow intervertebral spaces and mild anterior spurring are identified in the upper lumbar spine. Bilateral hip replacements are noted. Minimal facet joint sclerosis are noted in the mid to lower lumbar spine.   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  Electronically Signed   By: Craig Farr M.D.   On: 09/18/2023 14:52  COGNITION: Overall cognitive status: Within functional limits for tasks assessed   SENSATION: Not tested  EDEMA:  Not formally measured, but has pitting edema in bilateral lower legs (pt reports being on fluid pill) Pt  states she usually wears compression stockings 2-3x/week (not wearing today)  MUSCLE TONE:  Not formally assessed  DTRs:  Achilles: need to assess  POSTURE:  rounded shoulders, forward head, and posterior pelvic tilt  Cervical ROM:    Active Not formally measured, but observation of movement noted A/PROM (deg) eval  Flexion   Extension Decreased from normal   Right lateral flexion   Left lateral flexion   Right rotation   Left rotation Decreased compared to R  (Blank rows = not tested)  STRENGTH: Not formally assessed  LOWER EXTREMITY MMT:   MMT  Need to be assessed  Right  12/26/2023  Right  03/09/24 Left  12/26/2023 Left   03/09/24  Hip flexion 2- 4/5^ 2- 4+/5^  Hip extension       Hip abduction 4+ 4-/5 4+ 3+/5  Hip adduction 4+ N/a 4+ N/a  Hip internal rotation  5/5  4+/5  Hip external rotation  3+/5  4-/5  Knee flexion 4+ 5/5 3+ 5/5  Knee extension 4+ 5/5 4 5/5  Ankle dorsiflexion 4+ 5/5 4+ 5/5  Ankle plantarflexion 4  4   (Blank rows = not tested)  *reports pain in R thigh/quad muscle and R anterior hip  OTHOSTATICS:  12/23/23:  116/64 68bpm supine (no symptoms)  118/61 67bpm seated (no symptoms)  116/65 71bpm standing (no symptoms)  114/68 70bpm standing x 60 sec (no symptoms)  (Negative) *also negative with cardiology   03/09/24: 125/59mmHg 73bpm (supine)  121/60mmHg 72bpm (sitting)  110/58mmHg 81bpm (standing)  119/69 mmHg 81bpm Standing at 1 minutes                                                                                                                            TREATMENT DATE: 03/16/2024 *** Unless otherwise stated, CGA/SBA was provided and gait belt donned in order to ensure pt safety throughout session.  Therapy session focused on reviewing pt's HEP to assess her form/technique with the exercises and to assess if progression/upgrading is appropriate:  Supine heel slides with strap assist X8reps per LE Pt continues to report  fairly significant pulling pain in R quad/groin area  Pt demonstrates significant improvement in her strength to perform this exercise today  compared to when initially prescribed it on 02/19/24. Kept exercise on her HEP for pt to progress to Supine unilateral hip/knee flexion with her foot on swiss ball  X15reps per LE Pt reports still feeling activation of her hip flexors, but without pain compared to when using strap to assist Added to HEP Supine bridge X15reps Cuing to avoid excessive hip adduction Progressed to supine bridge with GTB resistance around knees to increase hip abductor activation 2x15 reps Pt has GTB at home Updated HEP to this exercise Supine hooklying hip abduction against GTB resistance 2x15reps Cuing for controlled movement Sit to stands, no UE support X12reps Educated pt to place a dense, folded up blanket in her seat at home to raise the chair height Pt not ready to progress to resisted sit to stands at this time based on her chair height at home; therefore, updated HEP appropriately Cuing for increased anterior trunk lean/weight shift Standing marches with unilateral UE support Performed facing sideways at counter so she can rub her knee along the counter to avoid compensation with excessive hip IR X8reps per LE Standing NBOS head nods X10reps This was challenging with pt having minor R posterior LOB Educated to ensure she is placing her hands down in the sink for balance safety with this exercise at home   Provided updated HEP printout. Educated patient to ensure she reads the exercise instructions as therapist placed specific information to ensure she performs them safely and with proper form at home. Educated pt on option to download Medbridge Go App on her phone to follow the exercises on there.   PATIENT EDUCATION: Education details: discussed orthostatic BP again.  Person educated: Patient Education method: Explanation and Handouts Education  comprehension: verbalized understanding and needs further education  HOME EXERCISE PROGRAM:  Access Code: [email or web address] URL: [email or web address] Date: 03/11/2024 Prepared by: Connell Kiss  Exercises - Supine Single Leg Hip and Knee Flexion ROM with Swiss Ball  - 2 x daily - 5 x weekly - 2 sets - 15 reps - Supine Heel Slide with Strap  - 1 x daily - 5 x weekly - 2 sets - 5 reps - Supine Bridge with Resistance Band  - 1 x daily - 5 x weekly - 3 sets - 15 reps - Hooklying Isometric Clamshell  - 1 x daily - 5 x weekly - 3 sets - 15 reps - Sit to Stand with Arms Crossed  - 1 x daily - 5 x weekly - 2 sets - 10 reps - Standing March with Counter Support  - 1 x daily - 7 x weekly - 2 sets - 10-15 reps - Narrow Stance with Head Nods and Counter Support  - 1 x daily - 7 x weekly - 2 sets - 10 reps  Prior Vestibular Exercises (removed from HEP on 02/19/24)  GOALS: Goals reviewed with patient? Yes  SHORT TERM GOALS: Target date: 04/02/2024  Patient will be independent in home exercise program to improve gaze stabilization and/or mobility for improved functional independence with ADLs while experiencing decreased dizziness symptoms. Baseline: initiated on 12/23/2023 02/05/2024: last updated on 01/13/2024 Goal status: IN PROGRESS  LONG TERM GOALS: Target date: 04/30/2024  Patient will reduce dizziness handicap inventory [facility]) score to <36, for less dizziness with ADLs and increased safety with home and work tasks.  Baseline: 48% 02/05/2024: 14% Goal status: MET  2.  Patient will deny any falls over past 4 weeks to demonstrate improved balance and safe independence with functional mobility.  Baseline: reports  frequent falls with 1 on day of initial eval 02/05/2024: 0 falls in past 4 weeks Goal status: MET and CONTINUE  3.  Patient will increase Berg Balance score to > 51/56 to demonstrate improved balance and decreased fall risk during functional activities and ADLs.  Baseline:  01/15/2024: 49/56 02/05/2024: 51/56 Goal status: IN PROGRESS  4.  Patient will increase 10 meter walk test to >1.59m/s using LRAD as to improve gait speed for better community ambulation and to reduce fall risk. Baseline: 0.4m/s  02/05/2024: Average Normal speed: 0.814 m/s & Average Fast speed: 1.065 m/s, no AD, independently  Goal status: IN PROGRESS  5.  Patient will increase Functional Gait Assessment (FGA) score to >20/30 as to reduce fall risk and improve dynamic gait safety with community ambulation.  Baseline: 15/30 02/05/2024: 16/30 Goal status: IN PROGRESS  ASSESSMENT:  CLINICAL IMPRESSION:  *** Therapy session focused on review of HEP to ensure proper form/technique and to modify exercises as appropriate. Patient demonstrates significant improvement in hip flexor stretch during supine heel slide with strap; however, continues to report significant pulling pain in R quad. Therefore, educated pt on option to use Swiss ball (pt has at home) to perform hip/knee flexion and increase repetition for strengthening until pt can progress back to use of strap. Patient continues to demo challenge rising to stand from a standard chair height without use of UEs; therefore, educated on use of folded up blanket to elevate seat height until she can successful perform 2sets of 10reps without UE support consistently. Patient also continues to demo balance deficits with minor postural sway during NBOS head nods with therapist reinforcing importance of balance training daily via this exercise. Patient will benefit from continued focus on dynamic balance with addition of functional strength training as appropriate. The pt will benefit from further skilled PT to improve positional and movement evoked dizziness and imbalance as well as B LE strength deficits in order to return pt to PLOF and increase QOL.    OBJECTIVE IMPAIRMENTS: Abnormal gait, cardiopulmonary status limiting activity, decreased activity  tolerance, decreased balance, decreased coordination, decreased endurance, decreased knowledge of condition, decreased knowledge of use of DME, decreased mobility, difficulty walking, decreased strength, and dizziness.   ACTIVITY LIMITATIONS: carrying, lifting, bending, standing, squatting, transfers, bed mobility, bathing, toileting, dressing, reach over head, hygiene/grooming, and locomotion level  PARTICIPATION LIMITATIONS: meal prep, cleaning, laundry, shopping, community activity, and yard work  PERSONAL FACTORS: Age, Time since onset of injury/illness/exacerbation, and 3+ comorbidities: Arthritis, clotting disorder, GERD, IBS, L TKA, spastic bladder are also affecting patient's functional outcome.   REHAB POTENTIAL: Good  CLINICAL DECISION MAKING: Evolving/moderate complexity  EVALUATION COMPLEXITY: Moderate   PLAN:  PT FREQUENCY: 1-2x/week PT DURATION: 8 weeks  PLANNED INTERVENTIONS: [id number]- PT Re-evaluation, 97750- Physical Performance Testing, 97110-Therapeutic exercises, 97530- Therapeutic activity, [id number]- Neuromuscular re-education, 97535- Self Care, [id number]- Manual therapy, [id number]- Gait training, [phone]- Orthotic Initial, [phone]- Orthotic/Prosthetic subsequent, [phone]- Canalith repositioning, Patient/Family education, Balance training, Stair training, Taping, Joint mobilization, Spinal mobilization, Vestibular training, Visual/preceptual remediation/compensation, DME instructions, Cryotherapy, Moist heat, and Biofeedback  PLAN FOR UPCOMING SESSIONS: *** Progress note* Follow-up on new HEP printout - App access? Reinforce importance of balance exercise Circuit training with dynamic balance and B LE functional strengthening Functional strength and balance, history of orthostatic BP issues that fluctuate, history of vertigo in remission.      Chiquita Silvan, SPT Physical Therapy Student - [facility]'  7:51 AM 03/16/24

## 2024-03-16 NOTE — Telephone Encounter (Signed)
 Called patient regarding missed appointment this morning. Pt reporting that she had thought today's appointment was at 11:15. Patient informed of no additional openings available this date. Pt unable to make up appointment this upcoming Thursday 03/18/24. Pt reminded of next appointment at 10:15 AM on 03/23/24.  Jennifer Frank, SPT Physical Therapy Student - [place]  [facility]

## 2024-03-17 NOTE — Assessment & Plan Note (Signed)
 She is still having intermittent urinary leakage.  She had seen urology.  D/w pt about trial off myrbetriq  since that did not appear to be helping and she could potentially get on 1 less medication.

## 2024-03-17 NOTE — Assessment & Plan Note (Signed)
Continue as needed trazodone. 

## 2024-03-17 NOTE — Assessment & Plan Note (Signed)
 Discussed restarting PPI.  She can update me as needed.

## 2024-03-17 NOTE — Assessment & Plan Note (Signed)
 Tetanus 2015 Flu 2025 PNA 2021 Shingles d/w pt.   covid vaccine 2021 Colonoscopy 2020, info given to patient 2025.  Pap not due.  Mammogram pending per gyn.   DXA prev done per gyn.  Husband and sons designated if patient were incapacitated.

## 2024-03-17 NOTE — Assessment & Plan Note (Signed)
 She inc'd salt intake in the meantime and that helped.  No recent lasix  use.  No CP.  Would continue with liberal fluid and salt intake.

## 2024-03-18 ENCOUNTER — Ambulatory Visit: Admitting: Physical Therapy

## 2024-03-20 ENCOUNTER — Ambulatory Visit: Payer: Self-pay | Admitting: Family Medicine

## 2024-03-20 DIAGNOSIS — E559 Vitamin D deficiency, unspecified: Secondary | ICD-10-CM

## 2024-03-20 MED ORDER — VITAMIN D3 50 MCG (2000 UT) PO CAPS: 4000.0000 [IU] | ORAL_CAPSULE | Freq: Every day | ORAL | Status: AC

## 2024-03-23 ENCOUNTER — Ambulatory Visit: Attending: Unknown Physician Specialty | Admitting: Physical Therapy

## 2024-03-23 DIAGNOSIS — R296 Repeated falls: Secondary | ICD-10-CM | POA: Insufficient documentation

## 2024-03-23 DIAGNOSIS — R42 Dizziness and giddiness: Secondary | ICD-10-CM | POA: Diagnosis not present

## 2024-03-23 DIAGNOSIS — R2681 Unsteadiness on feet: Secondary | ICD-10-CM | POA: Insufficient documentation

## 2024-03-23 DIAGNOSIS — R29898 Other symptoms and signs involving the musculoskeletal system: Secondary | ICD-10-CM | POA: Insufficient documentation

## 2024-03-23 DIAGNOSIS — M6281 Muscle weakness (generalized): Secondary | ICD-10-CM | POA: Insufficient documentation

## 2024-03-23 NOTE — Therapy (Signed)
 OUTPATIENT PHYSICAL THERAPY TREATMENT  Patient Name: VERDELL KINCANNON MRN: [id number] DOB:1950/08/14, 73 y.o., female Today's Date: 03/23/2024  END OF SESSION:     PT End of Session - 03/23/24 1019     Visit Number 19    Number of Visits 40    Date for Recertification  04/30/24    Authorization Type [employer] Medicare    Progress Note Due on Visit 20    PT Start Time 1018    PT Stop Time 1103    PT Time Calculation (min) 45 min    Equipment Utilized During Treatment --    Activity Tolerance Patient tolerated treatment well;No increased pain    Behavior During Therapy WFL for tasks assessed/performed             Past Medical History:  Diagnosis Date   Arthritis    Clotting disorder    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   Fundic gland polyps of stomach, benign    GERD (gastroesophageal reflux disease)    past hx    Hearing loss    IBS (irritable bowel syndrome)    Personal history of colonic polyps - adenoma 09/22/2013   PONV (postoperative nausea and vomiting)    Reflux    Past Surgical History:  Procedure Laterality Date   HIP ARTHROSCOPY Left 1997   KNEE ARTHROSCOPY Left 1996   KNEE ARTHROSCOPY Right    05-2016 same time as left knee replacement    POLYPECTOMY     TONSILLECTOMY AND ADENOIDECTOMY  1957   TOTAL KNEE ARTHROPLASTY Bilateral 05/27/2016   Procedure: LEFT TOTAL KNEE ARTHROPLASTY WITH RIGHT KNEE ARTHROSCOPY;  Surgeon: Dempsey Sensor, MD;  Location: [facility] OR;  Service: Orthopedics;  Laterality: Bilateral;   UPPER GASTROINTESTINAL ENDOSCOPY     WISDOM TOOTH EXTRACTION     Patient Active Problem List   Diagnosis Date Noted   Syncope 09/14/2023   Other social stressor 07/09/2023   Insomnia 07/09/2023   Back pain 03/12/2023   Skin irritation 03/12/2023   Tremor 09/08/2022   Osteopenia 02/11/2022   SOB (shortness of breath) 05/27/2021   Aortic atherosclerosis 03/12/2020   Medicare annual wellness visit, subsequent 01/10/2019   Advance care planning  12/14/2017   Urinary incontinence 12/14/2017   Primary osteoarthritis of left knee 05/26/2016   Health care maintenance 04/22/2016   Hearing loss 04/22/2014   Varicose veins of bilateral lower extremities with other complications 01/25/2013   Edema 01/25/2013   Hyperlipidemia 02/16/2007   GERD 02/16/2007    PCP: [name], MD REFERRING PROVIDER: [name], MD   REFERRING DIAG: R42 (ICD-10-CM) - Dizziness   THERAPY DIAG:  Muscle weakness (generalized)  Weakness of lower extremity, unspecified laterality  Dizziness and giddiness  Repeated falls  Unsteadiness on feet  ONSET DATE: Around February 2025  Rationale for Evaluation and Treatment: Rehabilitation  SUBJECTIVE:   SUBJECTIVE STATEMENT:  Pt states that she is doing good today.   Regarding follow up with MD, per pt report she was not given medication to raise BP, as it would require very close monitoring, told to continue with increased salt intake.   Pt reporting that she has had decreased LE swelling.   Recommendation for pelvic floor PT if desired d/t pt report of urinary leakage secondary to spastic bladder.    Pt reporting that she is still experiencing groin pain with some parts of HEP, including SLR & hip abduction.   Pt reporting some SOB recently, discussed it with MD at  appointment. Per pt report, was told that O2 looked good at appointment.   Pt states that she was able to do some walking at the mall without use of walker.    PERTINENT HISTORY: Arthritis, clotting disorder, GERD, IBS, L TKA, spastic bladder.  From Initial Eval: Pt states 2 weeks ago her PCP, Dr. Cleatus, referred her to cardiology and they did an EKG showing normal sinus rhythm. States she did wear heart monitor for 7 days with potential abnormal rhythm at night of tachycardia. Pt states cardiologist felt she had vertigo and referred her to ENT. Pt states a few years ago she was told she had loss of hearing. Pt states she  has learned to lip read really well since then while she saves up money to afford hearing aides. States she visited ENT and had debridement of her ears and they confirmed she needs hearing aides. Pt states ENT didn't see vertigo and referred her to vestibular rehab. Pt states she has frequent falls and fell this morning when getting up from seated position with L LOB. States she often falls to her LEFT, but sometimes face forward. States when she stands up she has to stand for a little while because if she moves too quick everything is revolving/spinning and she gets nauseous. Pt states last year or beginning of this year she had EMG and MRI, then was referred to PT for strength training. Pt states imaging found lumbar spine has bulging discs and that the physical therapist at the time discontinued care due to concern for exacerbating her lumbar spine symptoms. Pt states she did her exercises for a little while, but then gradually stopped doing them. Pt states she uses rollator when her husband is not with her for security and to have seat in tow. Pt states she has sudden onset anxious feeling with no known provocation and then she starts pushing AD too far out in front of her causing instability.  Pt says she has tremors in R hand and she was assessed for Parkinson's due to having family hx on her dad's side. Pt states she saw Neurologist last year and they said she didn't have Parkinson's. Pt also reports having quivering in R LE. Pt reports this morning she had some numbness down her R UE down to her middle finger, but it is gone now. Pt states if she stands with both of her hands up over her head she will blackout and pass out because she was doing a task overhead years ago and this happened. States she can raise 1 arm overhead and she is OK.  PAIN:  Are you having pain? No  PRECAUTIONS: Fall  WEIGHT BEARING RESTRICTIONS: No  FALLS: Has patient fallen in last 6 months? Yes. Number of falls 10+ with  pt having a fall this morning  LIVING ENVIRONMENT: Lives with: lives with their family and lives with their spouse Lives in: House/apartment Stairs: need to ask Has following equipment at home: Vannie - 4 wheeled, which pt uses if she doesn't have her husband's support  PLOF: Independent with basic ADLs, Independent with household mobility without device, Independent with community mobility without device, and Independent with homemaking with ambulation  PATIENT GOALS: improve dizziness, decrease number of falls, improve balance  OBJECTIVE:  Note: Objective measures were completed at Evaluation unless otherwise noted.  DIAGNOSTIC FINDINGS:   EXAM: MRI HEAD WITHOUT AND WITH CONTRAST TECHNIQUE: Multiplanar, multiecho pulse sequences of the brain and surrounding structures were obtained without and with intravenous  contrast. CONTRAST:  10 mL of Vueway  IV COMPARISON:  CT head 05/30/2000 FINDINGS: Brain: No acute infarction, hemorrhage, hydrocephalus, extra-axial collection or mass lesion. No abnormal enhancement. Dedicated imaging of the IAC's was performed. The visualized seventh and eighth cranial nerves are unremarkable without abnormal enhancement or mass. Normal CSF signal within the labyrinth bilaterally. Vascular: Normal flow voids. Skull and upper cervical spine: Normal marrow signal. Sinuses/Orbits: Clear sinuses.  No acute orbital findings. IMPRESSION: No evidence of acute abnormality or IAC mass. Electronically Signed   By: Gilmore GORMAN Molt M.D.   On: 01/17/2024 11:21  EXAM: LUMBAR SPINE - COMPLETE 4+ VIEW COMPARISON:  MRI of lumbar spine December 06, 2022 FINDINGS: There is no evidence of lumbar spine fracture. Alignment is normal. Mild narrow intervertebral spaces and mild anterior spurring are identified in the upper lumbar spine. Bilateral hip replacements are noted. Minimal facet joint sclerosis are noted in the mid to lower lumbar spine.   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  Electronically Signed   By: Craig Farr M.D.   On: 09/18/2023 14:52  COGNITION: Overall cognitive status: Within functional limits for tasks assessed   SENSATION: Not tested  EDEMA:  Not formally measured, but has pitting edema in bilateral lower legs (pt reports being on fluid pill) Pt states she usually wears compression stockings 2-3x/week (not wearing today)  MUSCLE TONE:  Not formally assessed  DTRs:  Achilles: need to assess  POSTURE:  rounded shoulders, forward head, and posterior pelvic tilt  Cervical ROM:    Active Not formally measured, but observation of movement noted A/PROM (deg) eval  Flexion   Extension Decreased from normal   Right lateral flexion   Left lateral flexion   Right rotation   Left rotation Decreased compared to R  (Blank rows = not tested)  STRENGTH: Not formally assessed  LOWER EXTREMITY MMT:   MMT  Need to be assessed  Right  12/26/2023  Right  03/09/24 Left  12/26/2023 Left   03/09/24  Hip flexion 2- 4/5^ 2- 4+/5^  Hip extension       Hip abduction 4+ 4-/5 4+ 3+/5  Hip adduction 4+ N/a 4+ N/a  Hip internal rotation  5/5  4+/5  Hip external rotation  3+/5  4-/5  Knee flexion 4+ 5/5 3+ 5/5  Knee extension 4+ 5/5 4 5/5  Ankle dorsiflexion 4+ 5/5 4+ 5/5  Ankle plantarflexion 4  4   (Blank rows = not tested)  *reports pain in R thigh/quad muscle and R anterior hip  OTHOSTATICS:  12/23/23:  116/64 68bpm supine (no symptoms)  118/61 67bpm seated (no symptoms)  116/65 71bpm standing (no symptoms)  114/68 70bpm standing x 60 sec (no symptoms)  (Negative) *also negative with cardiology   03/09/24: 125/25mmHg 73bpm (supine)  121/32mmHg 72bpm (sitting)  110/33mmHg 81bpm (standing)  119/69 mmHg 81bpm Standing at 1 minutes  TREATMENT DATE:  03/23/2024  Unless otherwise stated, CGA/SBA was provided in order to ensure pt safety throughout session.  Pt attempting seated lateral step overs, x10 each LE Regressed from yoga block to rolled AW Pt with compensatory strategies of knee extension & hip IR/ER to complete movements Attempted verbal & tactile cueing for increased flexion, then adduction without positive response  STS from green chair without use of UE support Pt noted to have some increased difficulty this date, noting several unsuccessful attempts Added airex pad in chair for increased seat height; pt able to complete 2x10 with improved success  Leg press x12 @ 40# Pt requiring modA for LE positioning onto foot plate, intermittent assist for maintaining positioning at foot plate Pt with improved concentric portion of exercise; noted decreased eccentric control compared to previous sessions Pt stating that she was experiencing increased difficulty of coordination needed for eccentric portion  Marches with UE support for balance at support bar, x15 each LE Noted improved hip flexion with activity this date  Fire hydrants in standing with UE support at bar Pt requiring verbal, tactile, & external cueing for technique Pt unsuccessful with attempts despite max cueing Pt continuing to report increased difficulty with coordination of movement  Pt educated to continue HEP as noted below, monitor groin pain.   Educated on plan: continue with HEP, next session to be progress note.    PATIENT EDUCATION: Education details: discussed orthostatic BP again.  Person educated: Patient Education method: Explanation and Handouts Education comprehension: verbalized understanding and needs further education  HOME EXERCISE PROGRAM:  Access Code: [email or web address] URL: [email or web address] Date: 03/11/2024 Prepared by: Connell Kiss  Exercises - Supine Single Leg Hip and Knee Flexion ROM with Swiss Ball  - 2 x daily - 5  x weekly - 2 sets - 15 reps - Supine Heel Slide with Strap  - 1 x daily - 5 x weekly - 2 sets - 5 reps - Supine Bridge with Resistance Band  - 1 x daily - 5 x weekly - 3 sets - 15 reps - Hooklying Isometric Clamshell  - 1 x daily - 5 x weekly - 3 sets - 15 reps - Sit to Stand with Arms Crossed  - 1 x daily - 5 x weekly - 2 sets - 10 reps - Standing March with Counter Support  - 1 x daily - 7 x weekly - 2 sets - 10-15 reps - Narrow Stance with Head Nods and Counter Support  - 1 x daily - 7 x weekly - 2 sets - 10 reps  Prior Vestibular Exercises (removed from HEP on 02/19/24)  GOALS: Goals reviewed with patient? Yes  SHORT TERM GOALS: Target date: 04/02/2024  Patient will be independent in home exercise program to improve gaze stabilization and/or mobility for improved functional independence with ADLs while experiencing decreased dizziness symptoms. Baseline: initiated on 12/23/2023 02/05/2024: last updated on 01/13/2024 Goal status: IN PROGRESS  LONG TERM GOALS: Target date: 04/30/2024  Patient will reduce dizziness handicap inventory [facility]) score to <36, for less dizziness with ADLs and increased safety with home and work tasks.  Baseline: 48% 02/05/2024: 14% Goal status: MET  2.  Patient will deny any falls over past 4 weeks to demonstrate improved balance and safe independence with functional mobility.  Baseline: reports frequent falls with 1 on day of initial eval 02/05/2024: 0 falls in past 4 weeks Goal status: MET and CONTINUE  3.  Patient will increase Berg Balance score to > 51/56 to demonstrate  improved balance and decreased fall risk during functional activities and ADLs.  Baseline: 01/15/2024: 49/56 02/05/2024: 51/56 Goal status: IN PROGRESS  4.  Patient will increase 10 meter walk test to >1.68m/s using LRAD as to improve gait speed for better community ambulation and to reduce fall risk. Baseline: 0.65m/s  02/05/2024: Average Normal speed: 0.814 m/s & Average Fast speed:  1.065 m/s, no AD, independently  Goal status: IN PROGRESS  5.  Patient will increase Functional Gait Assessment (FGA) score to >20/30 as to reduce fall risk and improve dynamic gait safety with community ambulation.  Baseline: 15/30 02/05/2024: 16/30 Goal status: IN PROGRESS  ASSESSMENT:  CLINICAL IMPRESSION:   Therapy session attempting to progress hip flexion abduction. Pt reporting increased difficulty with coordination of exercises today compared to previous sessions. Pt able to increase hip flexion with standing marches with UE support. Plan to continue to progress hip strengthening at next visit as appropriate. Patient will benefit from continued focus on dynamic balance with addition of functional strength training as appropriate. The pt will benefit from further skilled PT to improve positional and movement evoked dizziness and imbalance as well as B LE strength deficits in order to return pt to PLOF and increase QOL.    OBJECTIVE IMPAIRMENTS: Abnormal gait, cardiopulmonary status limiting activity, decreased activity tolerance, decreased balance, decreased coordination, decreased endurance, decreased knowledge of condition, decreased knowledge of use of DME, decreased mobility, difficulty walking, decreased strength, and dizziness.   ACTIVITY LIMITATIONS: carrying, lifting, bending, standing, squatting, transfers, bed mobility, bathing, toileting, dressing, reach over head, hygiene/grooming, and locomotion level  PARTICIPATION LIMITATIONS: meal prep, cleaning, laundry, shopping, community activity, and yard work  PERSONAL FACTORS: Age, Time since onset of injury/illness/exacerbation, and 3+ comorbidities: Arthritis, clotting disorder, GERD, IBS, L TKA, spastic bladder are also affecting patient's functional outcome.   REHAB POTENTIAL: Good  CLINICAL DECISION MAKING: Evolving/moderate complexity  EVALUATION COMPLEXITY: Moderate   PLAN:  PT FREQUENCY: 1-2x/week PT DURATION: 8  weeks  PLANNED INTERVENTIONS: [id number]- PT Re-evaluation, 97750- Physical Performance Testing, 97110-Therapeutic exercises, 97530- Therapeutic activity, [id number]- Neuromuscular re-education, 97535- Self Care, [id number]- Manual therapy, [id number]- Gait training, [phone]- Orthotic Initial, [phone]- Orthotic/Prosthetic subsequent, [phone]- Canalith repositioning, Patient/Family education, Balance training, Stair training, Taping, Joint mobilization, Spinal mobilization, Vestibular training, Visual/preceptual remediation/compensation, DME instructions, Cryotherapy, Moist heat, and Biofeedback  PLAN FOR UPCOMING SESSIONS:  Progress note* Follow-up on new HEP printout - App access? Reinforce importance of balance exercise Circuit training with dynamic balance and B LE functional strengthening Functional strength and balance, history of orthostatic BP issues that fluctuate, history of vertigo in remission.      Chiquita Silvan, SPT Physical Therapy Student - [facility]'  11:35 AM 03/23/24

## 2024-03-24 ENCOUNTER — Other Ambulatory Visit: Payer: Self-pay | Admitting: Family Medicine

## 2024-03-24 DIAGNOSIS — E785 Hyperlipidemia, unspecified: Secondary | ICD-10-CM

## 2024-03-24 MED ORDER — ROSUVASTATIN CALCIUM 5 MG PO TABS: 5.0000 mg | ORAL_TABLET | Freq: Every day | ORAL | 3 refills | Status: AC

## 2024-03-25 ENCOUNTER — Ambulatory Visit: Admitting: Physical Therapy

## 2024-03-25 DIAGNOSIS — R296 Repeated falls: Secondary | ICD-10-CM

## 2024-03-25 DIAGNOSIS — R29898 Other symptoms and signs involving the musculoskeletal system: Secondary | ICD-10-CM

## 2024-03-25 DIAGNOSIS — R42 Dizziness and giddiness: Secondary | ICD-10-CM

## 2024-03-25 DIAGNOSIS — R2681 Unsteadiness on feet: Secondary | ICD-10-CM | POA: Diagnosis not present

## 2024-03-25 DIAGNOSIS — M6281 Muscle weakness (generalized): Secondary | ICD-10-CM

## 2024-03-25 NOTE — Therapy (Signed)
 OUTPATIENT PHYSICAL THERAPY TREATMENT/Physical Therapy Progress Note   Dates of reporting period  02/05/24   to   03/25/24   Patient Name: Jennifer Frank MRN: [id number] DOB:01/13/1951, 73 y.o., female Today's Date: 03/25/2024  END OF SESSION:     PT End of Session - 03/25/24 1410     Visit Number 20    Number of Visits 40    Date for Recertification  04/30/24    Authorization Type [employer] Medicare    Progress Note Due on Visit 20    PT Start Time 1408    PT Stop Time 1451    PT Time Calculation (min) 43 min    Activity Tolerance Patient tolerated treatment well;No increased pain    Behavior During Therapy WFL for tasks assessed/performed              Past Medical History:  Diagnosis Date   Arthritis    Clotting disorder    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   Fundic gland polyps of stomach, benign    GERD (gastroesophageal reflux disease)    past hx    Hearing loss    IBS (irritable bowel syndrome)    Personal history of colonic polyps - adenoma 09/22/2013   PONV (postoperative nausea and vomiting)    Reflux    Past Surgical History:  Procedure Laterality Date   HIP ARTHROSCOPY Left 1997   KNEE ARTHROSCOPY Left 1996   KNEE ARTHROSCOPY Right    05-2016 same time as left knee replacement    POLYPECTOMY     TONSILLECTOMY AND ADENOIDECTOMY  1957   TOTAL KNEE ARTHROPLASTY Bilateral 05/27/2016   Procedure: LEFT TOTAL KNEE ARTHROPLASTY WITH RIGHT KNEE ARTHROSCOPY;  Surgeon: Dempsey Sensor, MD;  Location: [facility] OR;  Service: Orthopedics;  Laterality: Bilateral;   UPPER GASTROINTESTINAL ENDOSCOPY     WISDOM TOOTH EXTRACTION     Patient Active Problem List   Diagnosis Date Noted   Syncope 09/14/2023   Other social stressor 07/09/2023   Insomnia 07/09/2023   Back pain 03/12/2023   Skin irritation 03/12/2023   Tremor 09/08/2022   Osteopenia 02/11/2022   SOB (shortness of breath) 05/27/2021   Aortic atherosclerosis 03/12/2020   Medicare annual wellness visit,  subsequent 01/10/2019   Advance care planning 12/14/2017   Urinary incontinence 12/14/2017   Primary osteoarthritis of left knee 05/26/2016   Health care maintenance 04/22/2016   Hearing loss 04/22/2014   Varicose veins of bilateral lower extremities with other complications 01/25/2013   Edema 01/25/2013   Hyperlipidemia 02/16/2007   GERD 02/16/2007    PCP: [name], MD REFERRING PROVIDER: [name], MD   REFERRING DIAG: R42 (ICD-10-CM) - Dizziness   THERAPY DIAG:  Muscle weakness (generalized)  Weakness of lower extremity, unspecified laterality  Dizziness and giddiness  Repeated falls  Unsteadiness on feet  ONSET DATE: Around February 2025  Rationale for Evaluation and Treatment: Rehabilitation  SUBJECTIVE:   SUBJECTIVE STATEMENT:  Pt states that she is doing okay today. Pt stating that she almost cancelled her appointment this afternoon, as she feels like she is not making any progress, isn't sure about continuing with PT as she is tired of coming.   Pt stating I feel like I can walk without stumbling or getting dizzy. & I have gotten back to where I don't need help.   Regarding recent follow up with family medicine MD, pt to begin taking generic of Crestor & taking vitamin D .  PERTINENT HISTORY: Arthritis, clotting disorder, GERD, IBS, L TKA, spastic bladder.  From Initial Eval: Pt states 2 weeks ago her PCP, Dr. Cleatus, referred her to cardiology and they did an EKG showing normal sinus rhythm. States she did wear heart monitor for 7 days with potential abnormal rhythm at night of tachycardia. Pt states cardiologist felt she had vertigo and referred her to ENT. Pt states a few years ago she was told she had loss of hearing. Pt states she has learned to lip read really well since then while she saves up money to afford hearing aides. States she visited ENT and had debridement of her ears and they confirmed she needs hearing aides. Pt  states ENT didn't see vertigo and referred her to vestibular rehab. Pt states she has frequent falls and fell this morning when getting up from seated position with L LOB. States she often falls to her LEFT, but sometimes face forward. States when she stands up she has to stand for a little while because if she moves too quick everything is revolving/spinning and she gets nauseous. Pt states last year or beginning of this year she had EMG and MRI, then was referred to PT for strength training. Pt states imaging found lumbar spine has bulging discs and that the physical therapist at the time discontinued care due to concern for exacerbating her lumbar spine symptoms. Pt states she did her exercises for a little while, but then gradually stopped doing them. Pt states she uses rollator when her husband is not with her for security and to have seat in tow. Pt states she has sudden onset anxious feeling with no known provocation and then she starts pushing AD too far out in front of her causing instability.  Pt says she has tremors in R hand and she was assessed for Parkinson's due to having family hx on her dad's side. Pt states she saw Neurologist last year and they said she didn't have Parkinson's. Pt also reports having quivering in R LE. Pt reports this morning she had some numbness down her R UE down to her middle finger, but it is gone now. Pt states if she stands with both of her hands up over her head she will blackout and pass out because she was doing a task overhead years ago and this happened. States she can raise 1 arm overhead and she is OK.  PAIN:  Are you having pain? No  PRECAUTIONS: Fall  WEIGHT BEARING RESTRICTIONS: No  FALLS: Has patient fallen in last 6 months? Yes. Number of falls 10+ with pt having a fall this morning  LIVING ENVIRONMENT: Lives with: lives with their family and lives with their spouse Lives in: House/apartment Stairs: need to ask Has following equipment at home:  Vannie - 4 wheeled, which pt uses if she doesn't have her husband's support  PLOF: Independent with basic ADLs, Independent with household mobility without device, Independent with community mobility without device, and Independent with homemaking with ambulation  PATIENT GOALS: improve dizziness, decrease number of falls, improve balance  OBJECTIVE:  Note: Objective measures were completed at Evaluation unless otherwise noted.  DIAGNOSTIC FINDINGS:   EXAM: MRI HEAD WITHOUT AND WITH CONTRAST TECHNIQUE: Multiplanar, multiecho pulse sequences of the brain and surrounding structures were obtained without and with intravenous contrast. CONTRAST:  10 mL of Vueway  IV COMPARISON:  CT head 05/30/2000 FINDINGS: Brain: No acute infarction, hemorrhage, hydrocephalus, extra-axial collection or mass lesion. No abnormal enhancement. Dedicated imaging of the IAC's was  performed. The visualized seventh and eighth cranial nerves are unremarkable without abnormal enhancement or mass. Normal CSF signal within the labyrinth bilaterally. Vascular: Normal flow voids. Skull and upper cervical spine: Normal marrow signal. Sinuses/Orbits: Clear sinuses.  No acute orbital findings. IMPRESSION: No evidence of acute abnormality or IAC mass. Electronically Signed   By: Gilmore GORMAN Molt M.D.   On: 01/17/2024 11:21  EXAM: LUMBAR SPINE - COMPLETE 4+ VIEW COMPARISON:  MRI of lumbar spine December 06, 2022 FINDINGS: There is no evidence of lumbar spine fracture. Alignment is normal. Mild narrow intervertebral spaces and mild anterior spurring are identified in the upper lumbar spine. Bilateral hip replacements are noted. Minimal facet joint sclerosis are noted in the mid to lower lumbar spine.   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  Electronically Signed   By: Craig Farr M.D.   On: 09/18/2023 14:52  COGNITION: Overall cognitive status: Within functional limits for tasks  assessed   SENSATION: Not tested  EDEMA:  Not formally measured, but has pitting edema in bilateral lower legs (pt reports being on fluid pill) Pt states she usually wears compression stockings 2-3x/week (not wearing today)  MUSCLE TONE:  Not formally assessed  DTRs:  Achilles: need to assess  POSTURE:  rounded shoulders, forward head, and posterior pelvic tilt  Cervical ROM:    Active Not formally measured, but observation of movement noted A/PROM (deg) eval  Flexion   Extension Decreased from normal   Right lateral flexion   Left lateral flexion   Right rotation   Left rotation Decreased compared to R  (Blank rows = not tested)  STRENGTH: Not formally assessed  LOWER EXTREMITY MMT:   MMT  Need to be assessed  Right  12/26/2023  Right  03/09/24 Left  12/26/2023 Left   03/09/24  Hip flexion 2- 4/5^ 2- 4+/5^  Hip extension       Hip abduction 4+ 4-/5 4+ 3+/5  Hip adduction 4+ N/a 4+ N/a  Hip internal rotation  5/5  4+/5  Hip external rotation  3+/5  4-/5  Knee flexion 4+ 5/5 3+ 5/5  Knee extension 4+ 5/5 4 5/5  Ankle dorsiflexion 4+ 5/5 4+ 5/5  Ankle plantarflexion 4  4   (Blank rows = not tested)  *reports pain in R thigh/quad muscle and R anterior hip  OTHOSTATICS:  12/23/23:  116/64 68bpm supine (no symptoms)  118/61 67bpm seated (no symptoms)  116/65 71bpm standing (no symptoms)  114/68 70bpm standing x 60 sec (no symptoms)  (Negative) *also negative with cardiology   03/09/24: 125/22mmHg 73bpm (supine)  121/44mmHg 72bpm (sitting)  110/67mmHg 81bpm (standing)  119/69 mmHg 81bpm Standing at 1 minutes                                                                                                                            TREATMENT DATE: 03/25/2024  Unless otherwise stated, CGA/SBA was provided in order to ensure pt safety throughout  session.  10 Meter Walk Test: Patient instructed to walk 10 meters (32.8 ft) as quickly and as safely as  possible at their normal speed x2 and at a fast speed x2. Time measured from 2 meter mark to 8 meter mark to accommodate ramp-up and ramp-down.  Normal speed 1: 0.98 m/s (10.23 seconds) Normal speed 2: 1.01 m/s (9.95 seconds) Average Normal speed: 0.99 m/s Fast speed 1: 1.26 m/s (7.95 seconds) Fast speed 2: 1.14 m/s (8.77 seconds) Average Fast speed: 1.2 m/s Cut off scores: <0.4 m/s = household Ambulator, 0.4-0.8 m/s = limited community Ambulator, >0.8 m/s = community Ambulator, >1.2 m/s = crossing a street, <1.0 = increased fall risk MCID 0.05 m/s (small), 0.13 m/s (moderate), 0.06 m/s (significant)  (ANPTA Core Set of Outcome Measures for Adults with Neurologic Conditions, 2018)   [facility] PT Assessment - 03/25/24 0001       Berg Balance Test   Sit to Stand Able to stand without using hands and stabilize independently    Standing Unsupported Able to stand safely 2 minutes    Sitting with Back Unsupported but Feet Supported on Floor or Stool Able to sit safely and securely 2 minutes    Stand to Sit Sits safely with minimal use of hands    Transfers Able to transfer safely, minor use of hands    Standing Unsupported with Eyes Closed Able to stand 10 seconds safely    Standing Unsupported with Feet Together Able to place feet together independently and stand 1 minute safely    From Standing, Reach Forward with Outstretched Arm Can reach confidently >25 cm (10)    From Standing Position, Pick up Object from Floor Able to pick up shoe safely and easily    From Standing Position, Turn to Look Behind Over each Shoulder Looks behind from both sides and weight shifts well    Turn 360 Degrees Able to turn 360 degrees safely in 4 seconds or less    Standing Unsupported, Alternately Place Feet on Step/Stool Able to stand independently and safely and complete 8 steps in 20 seconds    Standing Unsupported, One Foot in Front Able to plae foot ahead of the other independently and hold 30 seconds     Standing on One Leg Tries to lift leg/unable to hold 3 seconds but remains standing independently    Total Score 52      Functional Gait  Assessment   Gait assessed  Yes    Gait Level Surface Walks 20 ft in less than 7 sec but greater than 5.5 sec, uses assistive device, slower speed, mild gait deviations, or deviates 6-10 in outside of the 12 in walkway width.    Change in Gait Speed Able to smoothly change walking speed without loss of balance or gait deviation. Deviate no more than 6 in outside of the 12 in walkway width.    Gait with Horizontal Head Turns Performs head turns smoothly with no change in gait. Deviates no more than 6 in outside 12 in walkway width    Gait with Vertical Head Turns Performs head turns with no change in gait. Deviates no more than 6 in outside 12 in walkway width.    Gait and Pivot Turn Pivot turns safely within 3 sec and stops quickly with no loss of balance.    Step Over Obstacle Is able to step over one shoe box (4.5 in total height) without changing gait speed. No evidence of imbalance.    Gait with  Narrow Base of Support Ambulates 4-7 steps.    Gait with Eyes Closed Walks 20 ft, uses assistive device, slower speed, mild gait deviations, deviates 6-10 in outside 12 in walkway width. Ambulates 20 ft in less than 9 sec but greater than 7 sec.    Ambulating Backwards Walks 20 ft, uses assistive device, slower speed, mild gait deviations, deviates 6-10 in outside 12 in walkway width.    Steps Alternating feet, must use rail.   able to do without railing ascending; 1 rail descending   Total Score 23           Discussed pt progress with therapy, remaining deficits. Pt agreeable to continue for additional sessions to continue working on functional strength & dynamic balance.     PATIENT EDUCATION: Education details: discussed orthostatic BP again.  Person educated: Patient Education method: Explanation and Handouts Education comprehension: verbalized  understanding and needs further education  HOME EXERCISE PROGRAM:  Access Code: [email or web address] URL: [email or web address] Date: 03/11/2024 Prepared by: Connell Kiss  Exercises - Supine Single Leg Hip and Knee Flexion ROM with Swiss Ball  - 2 x daily - 5 x weekly - 2 sets - 15 reps - Supine Heel Slide with Strap  - 1 x daily - 5 x weekly - 2 sets - 5 reps - Supine Bridge with Resistance Band  - 1 x daily - 5 x weekly - 3 sets - 15 reps - Hooklying Isometric Clamshell  - 1 x daily - 5 x weekly - 3 sets - 15 reps - Sit to Stand with Arms Crossed  - 1 x daily - 5 x weekly - 2 sets - 10 reps - Standing March with Counter Support  - 1 x daily - 7 x weekly - 2 sets - 10-15 reps - Narrow Stance with Head Nods and Counter Support  - 1 x daily - 7 x weekly - 2 sets - 10 reps  Prior Vestibular Exercises (removed from HEP on 02/19/24)  GOALS: Goals reviewed with patient? Yes  SHORT TERM GOALS: Target date: 04/02/2024  Patient will be independent in home exercise program to improve gaze stabilization and/or mobility for improved functional independence with ADLs while experiencing decreased dizziness symptoms. Baseline: initiated on 12/23/2023 02/05/2024: last updated on 01/13/2024 Goal status: IN PROGRESS  LONG TERM GOALS: Target date: 04/30/2024  Patient will reduce dizziness handicap inventory [facility]) score to <36, for less dizziness with ADLs and increased safety with home and work tasks.  Baseline: 48% 02/05/2024: 14% Goal status: MET  2.  Patient will deny any falls over past 4 weeks to demonstrate improved balance and safe independence with functional mobility.  Baseline: reports frequent falls with 1 on day of initial eval 02/05/2024: 0 falls in past 4 weeks Goal status: MET and CONTINUE  3.  Patient will increase Berg Balance score to > 51/56 to demonstrate improved balance and decreased fall risk during functional activities and ADLs.  Baseline: 01/15/2024: 49/56 02/05/2024:  51/56 03/25/24: 52/56 Goal status: MET  4.  Patient will increase 10 meter walk test to >1.69m/s using LRAD as to improve gait speed for better community ambulation and to reduce fall risk. Baseline: 0.32m/s  02/05/2024: Average Normal speed: 0.814 m/s & Average Fast speed: 1.065 m/s, no AD, independently  03/25/24: Average Normal speed: 0.99 m/s; Average Fast speed: 1.2 m/s Goal status: IN PROGRESS  5.  Patient will increase Functional Gait Assessment (FGA) score to >20/30 as to reduce fall risk and improve dynamic gait safety  with community ambulation.  Baseline: 15/30 02/05/2024: 16/30 03/25/24: 23/30 Goal status: MET  ASSESSMENT:  CLINICAL IMPRESSION:   Today's session focused on discussing pt's plan of care & completing physical performance measures for progress note. Pt continuing to make progress towards goal. Pt meeting goals for FGA & BERG, but with remaining deficits in hip flexion abduction, functional strength, & activities requiring SLS. Patient will benefit from continued focus on dynamic balance with addition of functional strength training as appropriate. The pt will benefit from further skilled PT to improve positional and movement evoked dizziness and imbalance as well as B LE strength deficits in order to return pt to PLOF and increase QOL. Patient's condition has the potential to improve in response to therapy. Maximum improvement is yet to be obtained. The anticipated improvement is attainable and reasonable in a generally predictable time.       OBJECTIVE IMPAIRMENTS: Abnormal gait, cardiopulmonary status limiting activity, decreased activity tolerance, decreased balance, decreased coordination, decreased endurance, decreased knowledge of condition, decreased knowledge of use of DME, decreased mobility, difficulty walking, decreased strength, and dizziness.   ACTIVITY LIMITATIONS: carrying, lifting, bending, standing, squatting, transfers, bed mobility, bathing,  toileting, dressing, reach over head, hygiene/grooming, and locomotion level  PARTICIPATION LIMITATIONS: meal prep, cleaning, laundry, shopping, community activity, and yard work  PERSONAL FACTORS: Age, Time since onset of injury/illness/exacerbation, and 3+ comorbidities: Arthritis, clotting disorder, GERD, IBS, L TKA, spastic bladder are also affecting patient's functional outcome.   REHAB POTENTIAL: Good  CLINICAL DECISION MAKING: Evolving/moderate complexity  EVALUATION COMPLEXITY: Moderate   PLAN:  PT FREQUENCY: 1-2x/week PT DURATION: 8 weeks  PLANNED INTERVENTIONS: [id number]- PT Re-evaluation, 97750- Physical Performance Testing, 97110-Therapeutic exercises, 97530- Therapeutic activity, [id number]- Neuromuscular re-education, 97535- Self Care, [id number]- Manual therapy, [id number]- Gait training, [phone]- Orthotic Initial, [phone]- Orthotic/Prosthetic subsequent, [phone]- Canalith repositioning, Patient/Family education, Balance training, Stair training, Taping, Joint mobilization, Spinal mobilization, Vestibular training, Visual/preceptual remediation/compensation, DME instructions, Cryotherapy, Moist heat, and Biofeedback  PLAN FOR UPCOMING SESSIONS:  Follow-up on new HEP printout - App access? Reinforce importance of balance exercise Circuit training with dynamic balance and B LE functional strengthening (hip flexion abduction) Functional strength and balance, history of orthostatic BP issues that fluctuate, history of vertigo in remission.      Chiquita Silvan, SPT Physical Therapy Student - [facility]'  6:08 PM 03/25/24

## 2024-03-30 ENCOUNTER — Ambulatory Visit

## 2024-03-30 DIAGNOSIS — R42 Dizziness and giddiness: Secondary | ICD-10-CM | POA: Diagnosis not present

## 2024-03-30 DIAGNOSIS — R2681 Unsteadiness on feet: Secondary | ICD-10-CM

## 2024-03-30 DIAGNOSIS — R29898 Other symptoms and signs involving the musculoskeletal system: Secondary | ICD-10-CM

## 2024-03-30 DIAGNOSIS — R296 Repeated falls: Secondary | ICD-10-CM

## 2024-03-30 DIAGNOSIS — M6281 Muscle weakness (generalized): Secondary | ICD-10-CM

## 2024-03-30 NOTE — Therapy (Signed)
 OUTPATIENT PHYSICAL THERAPY TREATMENT  Patient Name: Jennifer Frank MRN: [id number] DOB:01/03/1951, 73 y.o., female Today's Date: 03/30/2024  END OF SESSION:     PT End of Session - 03/30/24 1107     Visit Number 21    Number of Visits 40    Date for Recertification  04/30/24    Authorization Type [employer] Medicare    Authorization Time Period 03/04/24-04/30/24    Progress Note Due on Visit 30    PT Start Time 1100    PT Stop Time 1140    PT Time Calculation (min) 40 min    Equipment Utilized During Treatment Gait belt    Activity Tolerance Patient tolerated treatment well;No increased pain    Behavior During Therapy WFL for tasks assessed/performed         Past Medical History:  Diagnosis Date   Arthritis    Clotting disorder    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   Fundic gland polyps of stomach, benign    GERD (gastroesophageal reflux disease)    past hx    Hearing loss    IBS (irritable bowel syndrome)    Personal history of colonic polyps - adenoma 09/22/2013   PONV (postoperative nausea and vomiting)    Reflux    Past Surgical History:  Procedure Laterality Date   HIP ARTHROSCOPY Left 1997   KNEE ARTHROSCOPY Left 1996   KNEE ARTHROSCOPY Right    05-2016 same time as left knee replacement    POLYPECTOMY     TONSILLECTOMY AND ADENOIDECTOMY  1957   TOTAL KNEE ARTHROPLASTY Bilateral 05/27/2016   Procedure: LEFT TOTAL KNEE ARTHROPLASTY WITH RIGHT KNEE ARTHROSCOPY;  Surgeon: Dempsey Sensor, MD;  Location: [facility] OR;  Service: Orthopedics;  Laterality: Bilateral;   UPPER GASTROINTESTINAL ENDOSCOPY     WISDOM TOOTH EXTRACTION     Patient Active Problem List   Diagnosis Date Noted   Syncope 09/14/2023   Other social stressor 07/09/2023   Insomnia 07/09/2023   Back pain 03/12/2023   Skin irritation 03/12/2023   Tremor 09/08/2022   Osteopenia 02/11/2022   SOB (shortness of breath) 05/27/2021   Aortic atherosclerosis 03/12/2020   Medicare annual wellness visit,  subsequent 01/10/2019   Advance care planning 12/14/2017   Urinary incontinence 12/14/2017   Primary osteoarthritis of left knee 05/26/2016   Health care maintenance 04/22/2016   Hearing loss 04/22/2014   Varicose veins of bilateral lower extremities with other complications 01/25/2013   Edema 01/25/2013   Hyperlipidemia 02/16/2007   GERD 02/16/2007    PCP: [name], MD REFERRING PROVIDER: [name], MD   REFERRING DIAG: R42 (ICD-10-CM) - Dizziness   THERAPY DIAG:  Muscle weakness (generalized)  Weakness of lower extremity, unspecified laterality  Dizziness and giddiness  Repeated falls  Unsteadiness on feet  ONSET DATE: Around February 2025  Rationale for Evaluation and Treatment: Rehabilitation  SUBJECTIVE:   SUBJECTIVE STATEMENT: Pt says thing sare going well. She has been integrating more salty foods on low BP days, which has helped, however her LEE is a bit worse today. She has not taken her lasix  today.   PERTINENT HISTORY: Arthritis, clotting disorder, GERD, IBS, L TKA, spastic bladder.  From Initial Eval: Pt states 2 weeks ago her PCP, Dr. Cleatus, referred her to cardiology and they did an EKG showing normal sinus rhythm. States she did wear heart monitor for 7 days with potential abnormal rhythm at night of tachycardia. Pt states cardiologist felt she had vertigo and referred her  to ENT. Pt states a few years ago she was told she had loss of hearing. Pt states she has learned to lip read really well since then while she saves up money to afford hearing aides. States she visited ENT and had debridement of her ears and they confirmed she needs hearing aides. Pt states ENT didn't see vertigo and referred her to vestibular rehab. Pt states she has frequent falls and fell this morning when getting up from seated position with L LOB. States she often falls to her LEFT, but sometimes face forward. States when she stands up she has to stand for a little while  because if she moves too quick everything is revolving/spinning and she gets nauseous. Pt states last year or beginning of this year she had EMG and MRI, then was referred to PT for strength training. Pt states imaging found lumbar spine has bulging discs and that the physical therapist at the time discontinued care due to concern for exacerbating her lumbar spine symptoms. Pt states she did her exercises for a little while, but then gradually stopped doing them. Pt states she uses rollator when her husband is not with her for security and to have seat in tow. Pt states she has sudden onset anxious feeling with no known provocation and then she starts pushing AD too far out in front of her causing instability.  Pt says she has tremors in R hand and she was assessed for Parkinson's due to having family hx on her dad's side. Pt states she saw Neurologist last year and they said she didn't have Parkinson's. Pt also reports having quivering in R LE. Pt reports this morning she had some numbness down her R UE down to her middle finger, but it is gone now. Pt states if she stands with both of her hands up over her head she will blackout and pass out because she was doing a task overhead years ago and this happened. States she can raise 1 arm overhead and she is OK.  PAIN:  Are you having pain? No  PRECAUTIONS: Fall  WEIGHT BEARING RESTRICTIONS: No  FALLS: Has patient fallen in last 6 months? Yes. Number of falls 10+ with pt having a fall this morning  LIVING ENVIRONMENT: Lives with: lives with their family and lives with their spouse Lives in: House/apartment Stairs: need to ask Has following equipment at home: Vannie - 4 wheeled, which pt uses if she doesn't have her husband's support  PLOF: Independent with basic ADLs, Independent with household mobility without device, Independent with community mobility without device, and Independent with homemaking with ambulation  PATIENT GOALS: improve  dizziness, decrease number of falls, improve balance  OBJECTIVE:  Note: Objective measures were completed at Evaluation unless otherwise noted.  DIAGNOSTIC FINDINGS:   LOWER EXTREMITY MMT:   MMT  Need to be assessed  Right  12/26/2023  Right  03/09/24 Left  12/26/2023 Left   03/09/24  Hip flexion 2- 4/5^ 2- 4+/5^  Hip extension       Hip abduction 4+ 4-/5 4+ 3+/5  Hip adduction 4+ N/a 4+ N/a  Hip internal rotation  5/5  4+/5  Hip external rotation  3+/5  4-/5  Knee flexion 4+ 5/5 3+ 5/5  Knee extension 4+ 5/5 4 5/5  Ankle dorsiflexion 4+ 5/5 4+ 5/5  Ankle plantarflexion 4  4   (Blank rows = not tested)  *reports pain in R thigh/quad muscle and R anterior hip  OTHOSTATICS:  12/23/23:  116/64  68bpm supine (no symptoms)  118/61 67bpm seated (no symptoms)  116/65 71bpm standing (no symptoms)  114/68 70bpm standing x 60 sec (no symptoms)  (Negative) *also negative with cardiology   03/09/24: 125/2mmHg 73bpm (supine)  121/37mmHg 72bpm (sitting)  110/41mmHg 81bpm (standing)  119/69 mmHg 81bpm Standing at 1 minutes                                                                                                                          TREATMENT DATE: 03/30/2024 -supine with legs elevated x10 minutes, pitting edema noted bilat grade 4 up to knee joints.  -Supine BP: 114/11mmHg 71bpm  -manual palpation Rt anterior hip (pain and tenderness near psoas insertion  -hooklying hip fleixon 1x10 bilat (less painful in this position, points to less irritation of anterior hip capsule/psoas tendon compression) -hooklying bridge 1x15 -hooklying marching 1x10 bilat with YTB resistance at mid thigh (left leg weaker)  -RedTB clam x15  -hooklying bridge 1x15 -hooklying marching 1x10 bilat with YTB resistance at mid thigh (left leg fatigue)   -STS from plinth hands free x10  -seated BLE hip IR YTB mid calf 1x15 (pillow between knees for joint spacing)  -STS from plinth hands free x10   -seated BLE hip IR RTB mid calf 1x15 (pillow between knees for joint spacing)  *hip ABDCT exercises not gotten to for time   PATIENT EDUCATION: Education details: Plan for moving forward remaining goals for strength.    Person educated: Patient Education method: Explanation and Handouts Education comprehension: verbalized understanding and needs further education  HOME EXERCISE PROGRAM: Access Code: [email or web address] URL: [email or web address] Date: 03/11/2024 Prepared by: Connell Kiss  Exercises - Supine Single Leg Hip and Knee Flexion ROM with Swiss Ball  - 2 x daily - 5 x weekly - 2 sets - 15 reps - Supine Heel Slide with Strap  - 1 x daily - 5 x weekly - 2 sets - 5 reps - Supine Bridge with Resistance Band  - 1 x daily - 5 x weekly - 3 sets - 15 reps - Hooklying Isometric Clamshell  - 1 x daily - 5 x weekly - 3 sets - 15 reps - Sit to Stand with Arms Crossed  - 1 x daily - 5 x weekly - 2 sets - 10 reps - Standing March with Counter Support  - 1 x daily - 7 x weekly - 2 sets - 10-15 reps - Narrow Stance with Head Nods and Counter Support  - 1 x daily - 7 x weekly - 2 sets - 10 reps  Prior Vestibular Exercises (removed from [facility] on 02/19/24)  GOALS: Goals reviewed with patient? Yes  SHORT TERM GOALS: Target date: 04/02/2024  Patient will be independent in home exercise program to improve gaze stabilization and/or mobility for improved functional independence with ADLs while experiencing decreased dizziness symptoms. Baseline: initiated on 12/23/2023 02/05/2024: last updated on 01/13/2024 Goal status: ACHIEVED   LONG TERM GOALS: Target date: 04/30/2024 Patient will reduce  dizziness handicap inventory [facility]) score to <36, for less dizziness with ADLs and increased safety with home and work tasks.  Baseline: 48% 02/05/2024: 14% Goal status: MET  2.  Patient will deny any falls over past 4 weeks to demonstrate improved balance and safe independence with functional mobility.   Baseline: reports frequent falls with 1 on day of initial eval 02/05/2024: 0 falls in past 4 weeks Goal status: MET   3.  Patient will increase Berg Balance score to > 51/56 to demonstrate improved balance and decreased fall risk during functional activities and ADLs.  Baseline: 01/15/2024: 49/56; 02/05/2024: 51/56; 03/25/24: 52/56 Goal status: MET  4.  Patient will increase 10 meter walk test to >1.67m/s using LRAD as to improve gait speed for better community ambulation and to reduce fall risk. Baseline: 0.76m/s  02/05/2024: Average Normal speed: 0.814 m/s & Average Fast speed: 1.065 m/s, no AD, independently  03/25/24: Average Normal speed: 0.99 m/s; Average Fast speed: 1.2 m/s Goal status: ACHIEVED  5.  Patient will increase Functional Gait Assessment (FGA) score to >20/30 as to reduce fall risk and improve dynamic gait safety with community ambulation.  Baseline: 15/30; 02/05/2024: 16/30; 03/25/24: 23/30;  Goal status: MET  ASSESSMENT:  CLINICAL IMPRESSION:   Hypotension and vertigo symptoms remain stable, recent balance measures show improvement. Pt really wants to make progress with longstanding hip strength impairment, particularly painful Rt hip flexion. We started working on these today. The pt will benefit from further skilled PT to improve positional and movement evoked dizziness and imbalance as well as B LE strength deficits in order to return pt to PLOF and increase QOL. Patient's condition has the potential to improve in response to therapy. Maximum improvement is yet to be obtained. The anticipated improvement is attainable and reasonable in a generally predictable time.      OBJECTIVE IMPAIRMENTS: Abnormal gait, cardiopulmonary status limiting activity, decreased activity tolerance, decreased balance, decreased coordination, decreased endurance, decreased knowledge of condition, decreased knowledge of use of DME, decreased mobility, difficulty walking, decreased strength, and  dizziness.   ACTIVITY LIMITATIONS: carrying, lifting, bending, standing, squatting, transfers, bed mobility, bathing, toileting, dressing, reach over head, hygiene/grooming, and locomotion level  PARTICIPATION LIMITATIONS: meal prep, cleaning, laundry, shopping, community activity, and yard work  PERSONAL FACTORS: Age, Time since onset of injury/illness/exacerbation, and 3+ comorbidities: Arthritis, clotting disorder, GERD, IBS, L TKA, spastic bladder are also affecting patient's functional outcome.   REHAB POTENTIAL: Good  CLINICAL DECISION MAKING: Evolving/moderate complexity  EVALUATION COMPLEXITY: Moderate   PLAN:  PT FREQUENCY: 1-2x/week PT DURATION: 8 weeks  PLANNED INTERVENTIONS: 97164- PT Re-evaluation, 97750- Physical Performance Testing, 97110-Therapeutic exercises, 97530- Therapeutic activity, [id number]- Neuromuscular re-education, 97535- Self Care, [id number]- Manual therapy, [id number]- Gait training, [phone]- Orthotic Initial, [phone]- Orthotic/Prosthetic subsequent, [phone]- Canalith repositioning, Patient/Family education, Balance training, Stair training, Taping, Joint mobilization, Spinal mobilization, Vestibular training, Visual/preceptual remediation/compensation, DME instructions, Cryotherapy, Moist heat, and Biofeedback  PLAN FOR UPCOMING SESSIONS:  Hip strength in 4 directions.    11:48 AM, 03/30/24 Peggye JAYSON Linear, PT, DPT Physical Therapist - [facility]  Outpatient Physical Therapy- [facility] [phone]

## 2024-04-01 ENCOUNTER — Ambulatory Visit

## 2024-04-01 DIAGNOSIS — R296 Repeated falls: Secondary | ICD-10-CM | POA: Diagnosis not present

## 2024-04-01 DIAGNOSIS — R42 Dizziness and giddiness: Secondary | ICD-10-CM

## 2024-04-01 DIAGNOSIS — R2681 Unsteadiness on feet: Secondary | ICD-10-CM | POA: Diagnosis not present

## 2024-04-01 DIAGNOSIS — R29898 Other symptoms and signs involving the musculoskeletal system: Secondary | ICD-10-CM | POA: Diagnosis not present

## 2024-04-01 DIAGNOSIS — M6281 Muscle weakness (generalized): Secondary | ICD-10-CM

## 2024-04-01 NOTE — Therapy (Signed)
 OUTPATIENT PHYSICAL THERAPY TREATMENT  Patient Name: Jennifer Frank MRN: [id number] DOB:10-Apr-1951, 73 y.o., female Today's Date: 04/01/2024  END OF SESSION:     PT End of Session - 04/01/24 0941     Visit Number 22    Number of Visits 40    Date for Recertification  04/30/24    Authorization Type [employer] Medicare    Authorization Time Period 03/04/24-04/30/24    Progress Note Due on Visit 30    PT Start Time 0931    PT Stop Time 1011    PT Time Calculation (min) 40 min    Activity Tolerance Patient tolerated treatment well;No increased pain    Behavior During Therapy WFL for tasks assessed/performed         Past Medical History:  Diagnosis Date   Arthritis    Clotting disorder    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   Fundic gland polyps of stomach, benign    GERD (gastroesophageal reflux disease)    past hx    Hearing loss    IBS (irritable bowel syndrome)    Personal history of colonic polyps - adenoma 09/22/2013   PONV (postoperative nausea and vomiting)    Reflux    Past Surgical History:  Procedure Laterality Date   HIP ARTHROSCOPY Left 1997   KNEE ARTHROSCOPY Left 1996   KNEE ARTHROSCOPY Right    05-2016 same time as left knee replacement    POLYPECTOMY     TONSILLECTOMY AND ADENOIDECTOMY  1957   TOTAL KNEE ARTHROPLASTY Bilateral 05/27/2016   Procedure: LEFT TOTAL KNEE ARTHROPLASTY WITH RIGHT KNEE ARTHROSCOPY;  Surgeon: Dempsey Sensor, MD;  Location: [facility] OR;  Service: Orthopedics;  Laterality: Bilateral;   UPPER GASTROINTESTINAL ENDOSCOPY     WISDOM TOOTH EXTRACTION     Patient Active Problem List   Diagnosis Date Noted   Syncope 09/14/2023   Other social stressor 07/09/2023   Insomnia 07/09/2023   Back pain 03/12/2023   Skin irritation 03/12/2023   Tremor 09/08/2022   Osteopenia 02/11/2022   SOB (shortness of breath) 05/27/2021   Aortic atherosclerosis 03/12/2020   Medicare annual wellness visit, subsequent 01/10/2019   Advance care planning  12/14/2017   Urinary incontinence 12/14/2017   Primary osteoarthritis of left knee 05/26/2016   Health care maintenance 04/22/2016   Hearing loss 04/22/2014   Varicose veins of bilateral lower extremities with other complications 01/25/2013   Edema 01/25/2013   Hyperlipidemia 02/16/2007   GERD 02/16/2007    PCP: [name], MD REFERRING PROVIDER: [name], MD   REFERRING DIAG: R42 (ICD-10-CM) - Dizziness   THERAPY DIAG:  Muscle weakness (generalized)  Weakness of lower extremity, unspecified laterality  Dizziness and giddiness  Repeated falls  Unsteadiness on feet  ONSET DATE: Around February 2025  Rationale for Evaluation and Treatment: Rehabilitation  SUBJECTIVE:   SUBJECTIVE STATEMENT: Took lasix  yesterday. Ankles still swollen. Pt was sore from her last PT session.   PERTINENT HISTORY: Arthritis, clotting disorder, GERD, IBS, L TKA, spastic bladder.  From Initial Eval: Pt states 2 weeks ago her PCP, Dr. Cleatus, referred her to cardiology and they did an EKG showing normal sinus rhythm. States she did wear heart monitor for 7 days with potential abnormal rhythm at night of tachycardia. Pt states cardiologist felt she had vertigo and referred her to ENT. Pt states a few years ago she was told she had loss of hearing. Pt states she has learned to lip read really well since then while she  saves up money to afford hearing aides. States she visited ENT and had debridement of her ears and they confirmed she needs hearing aides. Pt states ENT didn't see vertigo and referred her to vestibular rehab. Pt states she has frequent falls and fell this morning when getting up from seated position with L LOB. States she often falls to her LEFT, but sometimes face forward. States when she stands up she has to stand for a little while because if she moves too quick everything is revolving/spinning and she gets nauseous. Pt states last year or beginning of this year she had EMG  and MRI, then was referred to PT for strength training. Pt states imaging found lumbar spine has bulging discs and that the physical therapist at the time discontinued care due to concern for exacerbating her lumbar spine symptoms. Pt states she did her exercises for a little while, but then gradually stopped doing them. Pt states she uses rollator when her husband is not with her for security and to have seat in tow. Pt states she has sudden onset anxious feeling with no known provocation and then she starts pushing AD too far out in front of her causing instability.  Pt says she has tremors in R hand and she was assessed for Parkinson's due to having family hx on her dad's side. Pt states she saw Neurologist last year and they said she didn't have Parkinson's. Pt also reports having quivering in R LE. Pt reports this morning she had some numbness down her R UE down to her middle finger, but it is gone now. Pt states if she stands with both of her hands up over her head she will blackout and pass out because she was doing a task overhead years ago and this happened. States she can raise 1 arm overhead and she is OK.  PAIN:  Are you having pain? No  PRECAUTIONS: Fall  WEIGHT BEARING RESTRICTIONS: No  FALLS: Has patient fallen in last 6 months? Yes. Number of falls 10+ with pt having a fall this morning  LIVING ENVIRONMENT: Lives with: lives with their family and lives with their spouse Lives in: House/apartment Stairs: need to ask Has following equipment at home: Vannie - 4 wheeled, which pt uses if she doesn't have her husband's support  PLOF: Independent with basic ADLs, Independent with household mobility without device, Independent with community mobility without device, and Independent with homemaking with ambulation  PATIENT GOALS: improve dizziness, decrease number of falls, improve balance  OBJECTIVE:  Note: Objective measures were completed at Evaluation unless otherwise  noted.  DIAGNOSTIC FINDINGS:   LOWER EXTREMITY MMT:   MMT  Need to be assessed  Right  12/26/2023  Right  03/09/24 Left  12/26/2023 Left   03/09/24  Hip flexion 2- 4/5^ 2- 4+/5^  Hip extension       Hip abduction 4+ 4-/5 4+ 3+/5  Hip adduction 4+ N/a 4+ N/a  Hip internal rotation  5/5  4+/5  Hip external rotation  3+/5  4-/5  Knee flexion 4+ 5/5 3+ 5/5  Knee extension 4+ 5/5 4 5/5  Ankle dorsiflexion 4+ 5/5 4+ 5/5  Ankle plantarflexion 4  4   (Blank rows = not tested)  *reports pain in R thigh/quad muscle and R anterior hip  OTHOSTATICS:  12/23/23:  116/64 68bpm supine (no symptoms)  118/61 67bpm seated (no symptoms)  116/65 71bpm standing (no symptoms)  114/68 70bpm standing x 60 sec (no symptoms)  (Negative) *also negative with  cardiology   03/09/24: 125/74mmHg 73bpm (supine)  121/39mmHg 72bpm (sitting)  110/45mmHg 81bpm (standing)  119/69 mmHg 81bpm Standing at 1 minutes                                                                                                                          TREATMENT DATE: 04/01/2024 -amb overground, no device, no LOB, mild LOP deviation: 70m32s for 685ft: at end 97% SpO2, 83bpm, mild SOB  -lateral side stepping in // bars with 5lb AW bilat (3x bilat) -heel raises x25 -STS from chair + airex pad x11  -lateral side stepping in // bars with 5lb AW bilat (3x bilat) -heel raises x25 -STS from chair + airex pad x11  -lateral side stepping in // bars with 5lb AW bilat (3x bilat) -heel raises x25 -STS from chair + airex pad x11   -With 5lb AW and 1-2 railings: up 16 stairs, down 16 stairs  Rest break  -With 7lb AW and 1-2 railings: up 16 stairs, down 16 stairs  Rest break  -With 7lb AW and 1-2 railings: up 16 stairs, down 16 stairs  Rest break   -forward AMB eyes closed, backward AMB eyes open 3x3 minutes  ---------------------------------  03/30/24 -supine with legs elevated x10 minutes, pitting edema noted bilat grade 4  up to knee joints.  -Supine BP: 114/21mmHg 71bpm  -manual palpation Rt anterior hip (pain and tenderness near psoas insertion  -hooklying hip fleixon 1x10 bilat (less painful in this position, points to less irritation of anterior hip capsule/psoas tendon compression) -hooklying bridge 1x15 -hooklying marching 1x10 bilat with YTB resistance at mid thigh (left leg weaker)  -RedTB clam x15  -hooklying bridge 1x15 -hooklying marching 1x10 bilat with YTB resistance at mid thigh (left leg fatigue)   -STS from plinth hands free x10  -seated BLE hip IR YTB mid calf 1x15 (pillow between knees for joint spacing)  -STS from plinth hands free x10  -seated BLE hip IR RTB mid calf 1x15 (pillow between knees for joint spacing)  *hip ABDCT exercises not gotten to for time   PATIENT EDUCATION: Education details: Plan for moving forward remaining goals for strength.    Person educated: Patient Education method: Explanation and Handouts Education comprehension: verbalized understanding and needs further education  HOME EXERCISE PROGRAM: Access Code: [email or web address] URL: [email or web address] Date: 03/11/2024 Prepared by: Connell Kiss  Exercises - Supine Single Leg Hip and Knee Flexion ROM with Swiss Ball  - 2 x daily - 5 x weekly - 2 sets - 15 reps - Supine Heel Slide with Strap  - 1 x daily - 5 x weekly - 2 sets - 5 reps - Supine Bridge with Resistance Band  - 1 x daily - 5 x weekly - 3 sets - 15 reps - Hooklying Isometric Clamshell  - 1 x daily - 5 x weekly - 3 sets - 15 reps - Sit to Stand with Arms Crossed  - 1 x daily - 5 x weekly -  2 sets - 10 reps - Standing March with Counter Support  - 1 x daily - 7 x weekly - 2 sets - 10-15 reps - Narrow Stance with Head Nods and Counter Support  - 1 x daily - 7 x weekly - 2 sets - 10 reps  Prior Vestibular Exercises (removed from [facility] on 02/19/24)  GOALS: Goals reviewed with patient? Yes  SHORT TERM GOALS: Target date: 04/02/2024  Patient  will be independent in home exercise program to improve gaze stabilization and/or mobility for improved functional independence with ADLs while experiencing decreased dizziness symptoms. Baseline: initiated on 12/23/2023 02/05/2024: last updated on 01/13/2024 Goal status: ACHIEVED   LONG TERM GOALS: Target date: 04/30/2024 Patient will reduce dizziness handicap inventory [facility]) score to <36, for less dizziness with ADLs and increased safety with home and work tasks.  Baseline: 48% 02/05/2024: 14% Goal status: MET  2.  Patient will deny any falls over past 4 weeks to demonstrate improved balance and safe independence with functional mobility.  Baseline: reports frequent falls with 1 on day of initial eval 02/05/2024: 0 falls in past 4 weeks Goal status: MET   3.  Patient will increase Berg Balance score to > 51/56 to demonstrate improved balance and decreased fall risk during functional activities and ADLs.  Baseline: 01/15/2024: 49/56; 02/05/2024: 51/56; 03/25/24: 52/56 Goal status: MET  4.  Patient will increase 10 meter walk test to >1.59m/s using LRAD as to improve gait speed for better community ambulation and to reduce fall risk. Baseline: 0.40m/s  02/05/2024: Average Normal speed: 0.814 m/s & Average Fast speed: 1.065 m/s, no AD, independently  03/25/24: Average Normal speed: 0.99 m/s; Average Fast speed: 1.2 m/s Goal status: ACHIEVED  5.  Patient will increase Functional Gait Assessment (FGA) score to >20/30 as to reduce fall risk and improve dynamic gait safety with community ambulation.  Baseline: 15/30; 02/05/2024: 16/30; 03/25/24: 23/30;  Goal status: MET  ASSESSMENT:  CLINICAL IMPRESSION:   Hypotension and vertigo symptoms remain stable, recent balance measures show improvement. Pt really wants to make progress with longstanding hip strength impairment, particularly painful Rt hip flexion. Continued to advance hip strengthening interventions. 3 sets of resisted loading to abduction,  flexion, and extension. Pt takes  a few breaks as needed. No orthostatic symptoms today while walking. Maximum improvement is yet to be obtained. The anticipated improvement is attainable and reasonable in a generally predictable time. Patient will benefit from skilled physical therapy intervention to reduce deficits and impairments identified in evaluation, in order to reduce pain, improve quality of life, and maximize activity tolerance for ADL, IADL, and leisure/fitness. Physical therapy will help pt achieve long and short term goals of care.     OBJECTIVE IMPAIRMENTS: Abnormal gait, cardiopulmonary status limiting activity, decreased activity tolerance, decreased balance, decreased coordination, decreased endurance, decreased knowledge of condition, decreased knowledge of use of DME, decreased mobility, difficulty walking, decreased strength, and dizziness.   ACTIVITY LIMITATIONS: carrying, lifting, bending, standing, squatting, transfers, bed mobility, bathing, toileting, dressing, reach over head, hygiene/grooming, and locomotion level  PARTICIPATION LIMITATIONS: meal prep, cleaning, laundry, shopping, community activity, and yard work  PERSONAL FACTORS: Age, Time since onset of injury/illness/exacerbation, and 3+ comorbidities: Arthritis, clotting disorder, GERD, IBS, L TKA, spastic bladder are also affecting patient's functional outcome.   REHAB POTENTIAL: Good  CLINICAL DECISION MAKING: Evolving/moderate complexity  EVALUATION COMPLEXITY: Moderate   PLAN:  PT FREQUENCY: 1-2x/week PT DURATION: 8 weeks  PLANNED INTERVENTIONS: 97164- PT Re-evaluation, 97750- Physical Performance Testing, 97110-Therapeutic exercises,  02469- Therapeutic activity, [id number]- Neuromuscular re-education, [id number]- Self Care, [id number]- Manual therapy, [id number]- Gait training, [phone]- Orthotic Initial, [phone]- Orthotic/Prosthetic subsequent, [phone]- Canalith repositioning, Patient/Family education, Balance training, Stair  training, Taping, Joint mobilization, Spinal mobilization, Vestibular training, Visual/preceptual remediation/compensation, DME instructions, Cryotherapy, Moist heat, and Biofeedback  PLAN FOR UPCOMING SESSIONS:  Hip strength in 4 directions.    9:42 AM, 04/01/24 Peggye JAYSON Linear, PT, DPT Physical Therapist - [facility]  Outpatient Physical Therapy- Main Campus [phone]

## 2024-04-06 ENCOUNTER — Ambulatory Visit

## 2024-04-06 DIAGNOSIS — R29898 Other symptoms and signs involving the musculoskeletal system: Secondary | ICD-10-CM

## 2024-04-06 DIAGNOSIS — R2681 Unsteadiness on feet: Secondary | ICD-10-CM

## 2024-04-06 DIAGNOSIS — M6281 Muscle weakness (generalized): Secondary | ICD-10-CM

## 2024-04-06 DIAGNOSIS — R296 Repeated falls: Secondary | ICD-10-CM

## 2024-04-06 DIAGNOSIS — R42 Dizziness and giddiness: Secondary | ICD-10-CM | POA: Diagnosis not present

## 2024-04-06 NOTE — Therapy (Signed)
 OUTPATIENT PHYSICAL THERAPY TREATMENT  Patient Name: Jennifer Frank MRN: [id number] DOB:09/16/1950, 73 y.o., female Today's Date: 04/06/2024  END OF SESSION:     PT End of Session - 04/06/24 1330     Visit Number 23    Number of Visits 40    Date for Recertification  04/30/24    Authorization Type [employer] Medicare    Authorization Time Period 03/04/24-04/30/24    Progress Note Due on Visit 30    PT Start Time 1320    PT Stop Time 1400    PT Time Calculation (min) 40 min    Equipment Utilized During Treatment Gait belt    Activity Tolerance Patient tolerated treatment well;No increased pain    Behavior During Therapy WFL for tasks assessed/performed         Past Medical History:  Diagnosis Date   Arthritis    Clotting disorder    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   Fundic gland polyps of stomach, benign    GERD (gastroesophageal reflux disease)    past hx    Hearing loss    IBS (irritable bowel syndrome)    Personal history of colonic polyps - adenoma 09/22/2013   PONV (postoperative nausea and vomiting)    Reflux    Past Surgical History:  Procedure Laterality Date   HIP ARTHROSCOPY Left 1997   KNEE ARTHROSCOPY Left 1996   KNEE ARTHROSCOPY Right    05-2016 same time as left knee replacement    POLYPECTOMY     TONSILLECTOMY AND ADENOIDECTOMY  1957   TOTAL KNEE ARTHROPLASTY Bilateral 05/27/2016   Procedure: LEFT TOTAL KNEE ARTHROPLASTY WITH RIGHT KNEE ARTHROSCOPY;  Surgeon: Dempsey Sensor, MD;  Location: [facility] OR;  Service: Orthopedics;  Laterality: Bilateral;   UPPER GASTROINTESTINAL ENDOSCOPY     WISDOM TOOTH EXTRACTION     Patient Active Problem List   Diagnosis Date Noted   Syncope 09/14/2023   Other social stressor 07/09/2023   Insomnia 07/09/2023   Back pain 03/12/2023   Skin irritation 03/12/2023   Tremor 09/08/2022   Osteopenia 02/11/2022   SOB (shortness of breath) 05/27/2021   Aortic atherosclerosis 03/12/2020   Medicare annual wellness visit,  subsequent 01/10/2019   Advance care planning 12/14/2017   Urinary incontinence 12/14/2017   Primary osteoarthritis of left knee 05/26/2016   Health care maintenance 04/22/2016   Hearing loss 04/22/2014   Varicose veins of bilateral lower extremities with other complications 01/25/2013   Edema 01/25/2013   Hyperlipidemia 02/16/2007   GERD 02/16/2007    PCP: [name], MD REFERRING PROVIDER: [name], MD   REFERRING DIAG: R42 (ICD-10-CM) - Dizziness   THERAPY DIAG:  Muscle weakness (generalized)  Weakness of lower extremity, unspecified laterality  Dizziness and giddiness  Repeated falls  Unsteadiness on feet  ONSET DATE: Around February 2025  Rationale for Evaluation and Treatment: Rehabilitation  SUBJECTIVE:   SUBJECTIVE STATEMENT: Ankle edema is mproving. Pt denies any major updates since last session.   PERTINENT HISTORY: Arthritis, clotting disorder, GERD, IBS, L TKA, spastic bladder.  From Initial Eval: Pt states 2 weeks ago her PCP, Dr. Cleatus, referred her to cardiology and they did an EKG showing normal sinus rhythm. States she did wear heart monitor for 7 days with potential abnormal rhythm at night of tachycardia. Pt states cardiologist felt she had vertigo and referred her to ENT. Pt states a few years ago she was told she had loss of hearing. Pt states she has learned to lip  read really well since then while she saves up money to afford hearing aides. States she visited ENT and had debridement of her ears and they confirmed she needs hearing aides. Pt states ENT didn't see vertigo and referred her to vestibular rehab. Pt states she has frequent falls and fell this morning when getting up from seated position with L LOB. States she often falls to her LEFT, but sometimes face forward. States when she stands up she has to stand for a little while because if she moves too quick everything is revolving/spinning and she gets nauseous. Pt states last year  or beginning of this year she had EMG and MRI, then was referred to PT for strength training. Pt states imaging found lumbar spine has bulging discs and that the physical therapist at the time discontinued care due to concern for exacerbating her lumbar spine symptoms. Pt states she did her exercises for a little while, but then gradually stopped doing them. Pt states she uses rollator when her husband is not with her for security and to have seat in tow. Pt states she has sudden onset anxious feeling with no known provocation and then she starts pushing AD too far out in front of her causing instability.  Pt says she has tremors in R hand and she was assessed for Parkinson's due to having family hx on her dad's side. Pt states she saw Neurologist last year and they said she didn't have Parkinson's. Pt also reports having quivering in R LE. Pt reports this morning she had some numbness down her R UE down to her middle finger, but it is gone now. Pt states if she stands with both of her hands up over her head she will blackout and pass out because she was doing a task overhead years ago and this happened. States she can raise 1 arm overhead and she is OK.  PAIN:  Are you having pain? No  PRECAUTIONS: Fall  WEIGHT BEARING RESTRICTIONS: No  FALLS: Has patient fallen in last 6 months? Yes. Number of falls 10+ with pt having a fall this morning  LIVING ENVIRONMENT: Lives with: lives with their family and lives with their spouse Lives in: House/apartment Stairs: need to ask Has following equipment at home: Vannie - 4 wheeled, which pt uses if she doesn't have her husband's support  PLOF: Independent with basic ADLs, Independent with household mobility without device, Independent with community mobility without device, and Independent with homemaking with ambulation  PATIENT GOALS: improve dizziness, decrease number of falls, improve balance  OBJECTIVE:  Note: Objective measures were completed at  Evaluation unless otherwise noted.  DIAGNOSTIC FINDINGS:   LOWER EXTREMITY MMT:   MMT  Need to be assessed  Right  12/26/2023  Right  03/09/24 Left  12/26/2023 Left   03/09/24  Hip flexion 2- 4/5^ 2- 4+/5^  Hip extension       Hip abduction 4+ 4-/5 4+ 3+/5  Hip adduction 4+ N/a 4+ N/a  Hip internal rotation  5/5  4+/5  Hip external rotation  3+/5  4-/5  Knee flexion 4+ 5/5 3+ 5/5  Knee extension 4+ 5/5 4 5/5  Ankle dorsiflexion 4+ 5/5 4+ 5/5  Ankle plantarflexion 4  4   (Blank rows = not tested)  *reports pain in R thigh/quad muscle and R anterior hip  OTHOSTATICS:  12/23/23:  116/64 68bpm supine (no symptoms)  118/61 67bpm seated (no symptoms)  116/65 71bpm standing (no symptoms)  114/68 70bpm standing x 60 sec (  no symptoms)  (Negative) *also negative with cardiology   03/09/24: 125/13mmHg 73bpm (supine)  121/25mmHg 72bpm (sitting)  110/62mmHg 81bpm (standing)  119/69 mmHg 81bpm Standing at 1 minutes                                                                                                                          TREATMENT DATE: 04/06/2024 -AMB overground, no device, 1055ft: 63m21s (Left flat foot strike, scuffing)   -seated BP 114/39mmHg 71bpm   -STS from elevated plinth, hands free 1x10 -double heel raise x25, standing, 2 hand support -marching, 2 hand support 5lb AW bilat (too heavy, unable)  -seated ankle DF c 5lb AW across MTP line x15  -STS from elevated plinth x15 -double heel raise x25, standing, 2 hand support -standing marching c 2lb AW bilat (still too heavy), then single march to box 12 and back to floor (unable to achieve psoas hip flexion, incredible fatiguability)  -seated ankle DF c 5lb AW across MTP line x15  -STS from elevated plinth x10 -double heel raise x25, standing, 2 hand support -standing marching x from 8 surface (1x6 bilat prior to onset failure of psoas hip flexion)  -seated ankle DF c 5lb AW across MTP line x15 -side  stepping c 5lb AW  30x bilat at support bar   ------------------------------------------ Prior session  -amb overground, no device, no LOB, mild LOP deviation: 39m32s for 680ft: at end 97% SpO2, 83bpm, mild SOB  -lateral side stepping in // bars with 5lb AW bilat (3x bilat) -heel raises x25 -STS from chair + airex pad x11  -lateral side stepping in // bars with 5lb AW bilat (3x bilat) -heel raises x25 -STS from chair + airex pad x11  -lateral side stepping in // bars with 5lb AW bilat (3x bilat) -heel raises x25 -STS from chair + airex pad x11   -With 5lb AW and 1-2 railings: up 16 stairs, down 16 stairs  Rest break  -With 7lb AW and 1-2 railings: up 16 stairs, down 16 stairs  Rest break  -With 7lb AW and 1-2 railings: up 16 stairs, down 16 stairs  Rest break   -forward AMB eyes closed, backward AMB eyes open 3x3 minutes  ---------------------------------  03/30/24 -supine with legs elevated x10 minutes, pitting edema noted bilat grade 4 up to knee joints.  -Supine BP: 114/41mmHg 71bpm  -manual palpation Rt anterior hip (pain and tenderness near psoas insertion  -hooklying hip fleixon 1x10 bilat (less painful in this position, points to less irritation of anterior hip capsule/psoas tendon compression) -hooklying bridge 1x15 -hooklying marching 1x10 bilat with YTB resistance at mid thigh (left leg weaker)  -RedTB clam x15  -hooklying bridge 1x15 -hooklying marching 1x10 bilat with YTB resistance at mid thigh (left leg fatigue)   -STS from plinth hands free x10  -seated BLE hip IR YTB mid calf 1x15 (pillow between knees for joint spacing)  -STS from plinth hands free x10  -seated BLE hip IR RTB  mid calf 1x15 (pillow between knees for joint spacing)  *hip ABDCT exercises not gotten to for time   PATIENT EDUCATION: Education details: Plan for moving forward remaining goals for strength.    Person educated: Patient Education method: Explanation and Handouts Education  comprehension: verbalized understanding and needs further education  HOME EXERCISE PROGRAM: Access Code: [email or web address] URL: [email or web address] Date: 03/11/2024 Prepared by: Connell Kiss  Exercises - Supine Single Leg Hip and Knee Flexion ROM with Swiss Ball  - 2 x daily - 5 x weekly - 2 sets - 15 reps - Supine Heel Slide with Strap  - 1 x daily - 5 x weekly - 2 sets - 5 reps - Supine Bridge with Resistance Band  - 1 x daily - 5 x weekly - 3 sets - 15 reps - Hooklying Isometric Clamshell  - 1 x daily - 5 x weekly - 3 sets - 15 reps - Sit to Stand with Arms Crossed  - 1 x daily - 5 x weekly - 2 sets - 10 reps - Standing March with Counter Support  - 1 x daily - 7 x weekly - 2 sets - 10-15 reps - Narrow Stance with Head Nods and Counter Support  - 1 x daily - 7 x weekly - 2 sets - 10 reps  Prior Vestibular Exercises (removed from [facility] on 02/19/24)  GOALS: Goals reviewed with patient? Yes  SHORT TERM GOALS: Target date: 04/02/2024  Patient will be independent in home exercise program to improve gaze stabilization and/or mobility for improved functional independence with ADLs while experiencing decreased dizziness symptoms. Baseline: initiated on 12/23/2023 02/05/2024: last updated on 01/13/2024 Goal status: ACHIEVED   LONG TERM GOALS: Target date: 04/30/2024 Patient will reduce dizziness handicap inventory [facility]) score to <36, for less dizziness with ADLs and increased safety with home and work tasks.  Baseline: 48% 02/05/2024: 14% Goal status: MET  2.  Patient will deny any falls over past 4 weeks to demonstrate improved balance and safe independence with functional mobility.  Baseline: reports frequent falls with 1 on day of initial eval 02/05/2024: 0 falls in past 4 weeks Goal status: MET   3.  Patient will increase Berg Balance score to > 51/56 to demonstrate improved balance and decreased fall risk during functional activities and ADLs.  Baseline: 01/15/2024: 49/56;  02/05/2024: 51/56; 03/25/24: 52/56 Goal status: MET  4.  Patient will increase 10 meter walk test to >1.91m/s using LRAD as to improve gait speed for better community ambulation and to reduce fall risk. Baseline: 0.42m/s  02/05/2024: Average Normal speed: 0.814 m/s & Average Fast speed: 1.065 m/s, no AD, independently  03/25/24: Average Normal speed: 0.99 m/s; Average Fast speed: 1.2 m/s Goal status: ACHIEVED  5.  Patient will increase Functional Gait Assessment (FGA) score to >20/30 as to reduce fall risk and improve dynamic gait safety with community ambulation.  Baseline: 15/30; 02/05/2024: 16/30; 03/25/24: 23/30;  Goal status: MET  ASSESSMENT:  CLINICAL IMPRESSION:   Hypotension and vertigo symptoms remain stable, recent balance measures show improvement. Pt really wants to make progress with longstanding hip strength impairment, particularly painful Rt hip flexion. Continued to advance hip strengthening interventions. Hip flexion remains remarkably weak, especially in angles that are psoas dominant- will need to correlate tendon rupture v radicular weakness. Pt pretty bummed about difficulty perfomring hip flexion over a variety of exercise options. Maximum improvement is yet to be obtained. The anticipated improvement is attainable and reasonable in a generally predictable time. Patient will  benefit from skilled physical therapy intervention to reduce deficits and impairments identified in evaluation, in order to reduce pain, improve quality of life, and maximize activity tolerance for ADL, IADL, and leisure/fitness. Physical therapy will help pt achieve long and short term goals of care.    OBJECTIVE IMPAIRMENTS: Abnormal gait, cardiopulmonary status limiting activity, decreased activity tolerance, decreased balance, decreased coordination, decreased endurance, decreased knowledge of condition, decreased knowledge of use of DME, decreased mobility, difficulty walking, decreased strength, and  dizziness.   ACTIVITY LIMITATIONS: carrying, lifting, bending, standing, squatting, transfers, bed mobility, bathing, toileting, dressing, reach over head, hygiene/grooming, and locomotion level  PARTICIPATION LIMITATIONS: meal prep, cleaning, laundry, shopping, community activity, and yard work  PERSONAL FACTORS: Age, Time since onset of injury/illness/exacerbation, and 3+ comorbidities: Arthritis, clotting disorder, GERD, IBS, L TKA, spastic bladder are also affecting patient's functional outcome.   REHAB POTENTIAL: Good  CLINICAL DECISION MAKING: Evolving/moderate complexity  EVALUATION COMPLEXITY: Moderate   PLAN:  PT FREQUENCY: 1-2x/week PT DURATION: 8 weeks  PLANNED INTERVENTIONS: [id number]- PT Re-evaluation, 97750- Physical Performance Testing, 97110-Therapeutic exercises, 97530- Therapeutic activity, [id number]- Neuromuscular re-education, 97535- Self Care, [id number]- Manual therapy, [id number]- Gait training, [phone]- Orthotic Initial, [phone]- Orthotic/Prosthetic subsequent, [phone]- Canalith repositioning, Patient/Family education, Balance training, Stair training, Taping, Joint mobilization, Spinal mobilization, Vestibular training, Visual/preceptual remediation/compensation, DME instructions, Cryotherapy, Moist heat, and Biofeedback  PLAN FOR UPCOMING SESSIONS:  Hip strength in 4 directions.    1:32 PM, 04/06/24 Peggye JAYSON Linear, PT, DPT Physical Therapist - [facility]  Outpatient Physical Therapy- [facility] [phone]

## 2024-04-08 ENCOUNTER — Ambulatory Visit

## 2024-04-08 DIAGNOSIS — R2681 Unsteadiness on feet: Secondary | ICD-10-CM | POA: Diagnosis not present

## 2024-04-08 DIAGNOSIS — R42 Dizziness and giddiness: Secondary | ICD-10-CM

## 2024-04-08 DIAGNOSIS — M6281 Muscle weakness (generalized): Secondary | ICD-10-CM

## 2024-04-08 DIAGNOSIS — R296 Repeated falls: Secondary | ICD-10-CM

## 2024-04-08 DIAGNOSIS — R29898 Other symptoms and signs involving the musculoskeletal system: Secondary | ICD-10-CM | POA: Diagnosis not present

## 2024-04-08 NOTE — Therapy (Signed)
 OUTPATIENT PHYSICAL THERAPY TREATMENT  Patient Name: Jennifer Frank MRN: [id number] DOB:03/23/1951, 73 y.o., female Today's Date: 04/08/2024  END OF SESSION:     PT End of Session - 04/08/24 0852     Visit Number 24    Number of Visits 40    Date for Recertification  04/30/24    Authorization Type [employer] Medicare    Authorization Time Period 03/04/24-04/30/24    Progress Note Due on Visit 30    PT Start Time 0845    PT Stop Time 0925    PT Time Calculation (min) 40 min    Equipment Utilized During Treatment Gait belt    Activity Tolerance Patient tolerated treatment well;No increased pain    Behavior During Therapy WFL for tasks assessed/performed         Past Medical History:  Diagnosis Date   Arthritis    Clotting disorder    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   Fundic gland polyps of stomach, benign    GERD (gastroesophageal reflux disease)    past hx    Hearing loss    IBS (irritable bowel syndrome)    Personal history of colonic polyps - adenoma 09/22/2013   PONV (postoperative nausea and vomiting)    Reflux    Past Surgical History:  Procedure Laterality Date   HIP ARTHROSCOPY Left 1997   KNEE ARTHROSCOPY Left 1996   KNEE ARTHROSCOPY Right    05-2016 same time as left knee replacement    POLYPECTOMY     TONSILLECTOMY AND ADENOIDECTOMY  1957   TOTAL KNEE ARTHROPLASTY Bilateral 05/27/2016   Procedure: LEFT TOTAL KNEE ARTHROPLASTY WITH RIGHT KNEE ARTHROSCOPY;  Surgeon: Dempsey Sensor, MD;  Location: [facility] OR;  Service: Orthopedics;  Laterality: Bilateral;   UPPER GASTROINTESTINAL ENDOSCOPY     WISDOM TOOTH EXTRACTION     Patient Active Problem List   Diagnosis Date Noted   Syncope 09/14/2023   Other social stressor 07/09/2023   Insomnia 07/09/2023   Back pain 03/12/2023   Skin irritation 03/12/2023   Tremor 09/08/2022   Osteopenia 02/11/2022   SOB (shortness of breath) 05/27/2021   Aortic atherosclerosis 03/12/2020   Medicare annual wellness visit,  subsequent 01/10/2019   Advance care planning 12/14/2017   Urinary incontinence 12/14/2017   Primary osteoarthritis of left knee 05/26/2016   Health care maintenance 04/22/2016   Hearing loss 04/22/2014   Varicose veins of bilateral lower extremities with other complications 01/25/2013   Edema 01/25/2013   Hyperlipidemia 02/16/2007   GERD 02/16/2007    PCP: [name], MD REFERRING PROVIDER: [name], MD   REFERRING DIAG: R42 (ICD-10-CM) - Dizziness   THERAPY DIAG:  Muscle weakness (generalized)  Weakness of lower extremity, unspecified laterality  Dizziness and giddiness  Repeated falls  Unsteadiness on feet  ONSET DATE: Around February 2025  Rationale for Evaluation and Treatment: Rehabilitation  SUBJECTIVE:   SUBJECTIVE STATEMENT: Ankle edema is improving, back to baseline. Pt denies any major updates since last session.   PERTINENT HISTORY: Arthritis, clotting disorder, GERD, IBS, L TKA, spastic bladder.  From Initial Eval: Pt states 2 weeks ago her PCP, Dr. Cleatus, referred her to cardiology and they did an EKG showing normal sinus rhythm. States she did wear heart monitor for 7 days with potential abnormal rhythm at night of tachycardia. Pt states cardiologist felt she had vertigo and referred her to ENT. Pt states a few years ago she was told she had loss of hearing. Pt states she has  learned to lip read really well since then while she saves up money to afford hearing aides. States she visited ENT and had debridement of her ears and they confirmed she needs hearing aides. Pt states ENT didn't see vertigo and referred her to vestibular rehab. Pt states she has frequent falls and fell this morning when getting up from seated position with L LOB. States she often falls to her LEFT, but sometimes face forward. States when she stands up she has to stand for a little while because if she moves too quick everything is revolving/spinning and she gets nauseous. Pt  states last year or beginning of this year she had EMG and MRI, then was referred to PT for strength training. Pt states imaging found lumbar spine has bulging discs and that the physical therapist at the time discontinued care due to concern for exacerbating her lumbar spine symptoms. Pt states she did her exercises for a little while, but then gradually stopped doing them. Pt states she uses rollator when her husband is not with her for security and to have seat in tow. Pt states she has sudden onset anxious feeling with no known provocation and then she starts pushing AD too far out in front of her causing instability.  Pt says she has tremors in R hand and she was assessed for Parkinson's due to having family hx on her dad's side. Pt states she saw Neurologist last year and they said she didn't have Parkinson's. Pt also reports having quivering in R LE. Pt reports this morning she had some numbness down her R UE down to her middle finger, but it is gone now. Pt states if she stands with both of her hands up over her head she will blackout and pass out because she was doing a task overhead years ago and this happened. States she can raise 1 arm overhead and she is OK.  PAIN:  Are you having pain? No  PRECAUTIONS: Fall  WEIGHT BEARING RESTRICTIONS: No  FALLS: Has patient fallen in last 6 months? Yes (2-3x) as of 04/08/24  PLOF: Independent with basic ADLs, Independent with household mobility without device, Independent with community mobility without device, and Independent with homemaking with ambulation  PATIENT GOALS: improve dizziness, decrease number of falls, improve balance  OBJECTIVE:  Note: Objective measures were completed at Evaluation unless otherwise noted.  DIAGNOSTIC FINDINGS:   Pelvic CT 2024 IMPRESSION: 1. Normal appearance of the sacral plexus and proximal sciatic nerves bilaterally. 2. Mild left iliopsoas bursitis. 3. Small and atrophic iliopsoas muscles bilaterally  in a symmetric manner not substantially changed from 06/17/2021. Fluid signal deep to the iliacus musculature, nonspecific and likely chronic. 4. Substantial atrophy of the upper hip adductor musculature, although not disproportionate to that shown on 06/17/2021. 5. Disc desiccation at L4-5 and L5-S1. LOWER EXTREMITY MMT:   MMT  Need to be assessed  Right  12/26/2023  Right  03/09/24 Left  12/26/2023 Left   03/09/24  Hip flexion 2- 4/5^ 2- 4+/5^  Hip extension       Hip abduction 4+ 4-/5 4+ 3+/5  Hip adduction 4+ N/a 4+ N/a  Hip internal rotation  5/5  4+/5  Hip external rotation  3+/5  4-/5  Knee flexion 4+ 5/5 3+ 5/5  Knee extension 4+ 5/5 4 5/5  Ankle dorsiflexion 4+ 5/5 4+ 5/5  Ankle plantarflexion 4  4   (Blank rows = not tested)  *reports pain in R thigh/quad muscle and R anterior hip  OTHOSTATICS:  12/23/23:  116/64 68bpm supine (no symptoms)  118/61 67bpm seated (no symptoms)  116/65 71bpm standing (no symptoms)  114/68 70bpm standing x 60 sec (no symptoms)  (Negative) *also negative with cardiology   03/09/24: 125/69mmHg 73bpm (supine)  121/43mmHg 72bpm (sitting)  110/55mmHg 81bpm (standing)  119/69 mmHg 81bpm Standing at 1 minutes   04/08/24 132/74 mmHg 62 bpm seated  133/38mmHg 70bpm standing                                                                                                                         TREATMENT DATE: 04/08/2024 04/08/24 132/74 mmHg 62 bpm seated  133/75mmHg 70bpm standing  -AMB overground, no device, 1068ft: 59m34s   *less SOB today, can achieve Left heel strike when concentrating, otherwise scuffs during Left compensated  [place].   -seated BP 114/33mmHg 71bpm   -STS from elevation x12, hands free - standing double heel raise x20 (BUE support for balance)  -wall leaning ankle DF x15 (~12 inches out)  -STS from elevation x12, hands free - standing double heel raise x20 (BUE support for balance)  -wall leaning  ankle DF x15 (~14 inches out)   -Hooklying marching 1x12, alternating, redTB at knees (cues for shoulder width knees)  -Green ball Squeeze 15x3secH  -Hooklying marching 1x12, alternating, redTB at knees, then 1x8 without band  -Green ball Squeeze 15x3secH  -Hooklying marching high and wide 1x20 -Green ball Squeeze 15x3secH  -Hooklying marching high and wide 1x20  *needs frequent help with attending to reps and instructions   PATIENT EDUCATION: Education details: Plan for moving forward remaining goals for strength.    Person educated: Patient Education method: Explanation and Handouts Education comprehension: verbalized understanding and needs further education  HOME EXERCISE PROGRAM: Access Code: [email or web address] URL: [email or web address] Date: 03/11/2024 Prepared by: Connell Kiss  Exercises - Supine Single Leg Hip and Knee Flexion ROM with Swiss Ball  - 2 x daily - 5 x weekly - 2 sets - 15 reps - Supine Heel Slide with Strap  - 1 x daily - 5 x weekly - 2 sets - 5 reps - Supine Bridge with Resistance Band  - 1 x daily - 5 x weekly - 3 sets - 15 reps - Hooklying Isometric Clamshell  - 1 x daily - 5 x weekly - 3 sets - 15 reps - Sit to Stand with Arms Crossed  - 1 x daily - 5 x weekly - 2 sets - 10 reps - Standing March with Counter Support  - 1 x daily - 7 x weekly - 2 sets - 10-15 reps - Narrow Stance with Head Nods and Counter Support  - 1 x daily - 7 x weekly - 2 sets - 10 reps  Prior Vestibular Exercises (removed from [facility] on 02/19/24)  GOALS: Goals reviewed with patient? Yes  SHORT TERM GOALS: Target date: 04/02/2024  Patient will be independent in home exercise program to improve gaze stabilization and/or mobility for improved functional independence  with ADLs while experiencing decreased dizziness symptoms. Baseline: initiated on 12/23/2023 02/05/2024: last updated on 01/13/2024 Goal status: ACHIEVED   LONG TERM GOALS: Target date: 04/30/2024 Patient will  reduce dizziness handicap inventory [facility]) score to <36, for less dizziness with ADLs and increased safety with home and work tasks.  Baseline: 48% 02/05/2024: 14% Goal status: MET  2.  Patient will deny any falls over past 4 weeks to demonstrate improved balance and safe independence with functional mobility.  Baseline: reports frequent falls with 1 on day of initial eval 02/05/2024: 0 falls in past 4 weeks Goal status: MET   3.  Patient will increase Berg Balance score to > 51/56 to demonstrate improved balance and decreased fall risk during functional activities and ADLs.  Baseline: 01/15/2024: 49/56; 02/05/2024: 51/56; 03/25/24: 52/56 Goal status: MET  4.  Patient will increase 10 meter walk test to >1.51m/s using LRAD as to improve gait speed for better community ambulation and to reduce fall risk. Baseline: 0.77m/s  02/05/2024: Average Normal speed: 0.814 m/s & Average Fast speed: 1.065 m/s, no AD, independently  03/25/24: Average Normal speed: 0.99 m/s; Average Fast speed: 1.2 m/s Goal status: ACHIEVED  5.  Patient will increase Functional Gait Assessment (FGA) score to >20/30 as to reduce fall risk and improve dynamic gait safety with community ambulation.  Baseline: 15/30; 02/05/2024: 16/30; 03/25/24: 23/30;  Goal status: MET  ASSESSMENT:  CLINICAL IMPRESSION:   Hypotension remains improved. Continued to maximize ankle and hip strength vital for righting reactions. Further chart review after last session notable for CT imaging > 2 years ago correlating atrophic changes to bilat iliospoas and pectineus, would explain focal strength impairments about the hip. Hooklying seems to be a successful position to target psoas specifically for hip flexion, albeit it remains weak- last session, difficult to find another successful position. Patient will benefit from skilled physical therapy intervention to reduce deficits and impairments identified in evaluation, in order to reduce pain, improve quality  of life, and maximize activity tolerance for ADL, IADL, and leisure/fitness. Physical therapy will help pt achieve long and short term goals of care.    OBJECTIVE IMPAIRMENTS: Abnormal gait, cardiopulmonary status limiting activity, decreased activity tolerance, decreased balance, decreased coordination, decreased endurance, decreased knowledge of condition, decreased knowledge of use of DME, decreased mobility, difficulty walking, decreased strength, and dizziness.   ACTIVITY LIMITATIONS: carrying, lifting, bending, standing, squatting, transfers, bed mobility, bathing, toileting, dressing, reach over head, hygiene/grooming, and locomotion level  PARTICIPATION LIMITATIONS: meal prep, cleaning, laundry, shopping, community activity, and yard work  PERSONAL FACTORS: Age, Time since onset of injury/illness/exacerbation, and 3+ comorbidities: Arthritis, clotting disorder, GERD, IBS, L TKA, spastic bladder are also affecting patient's functional outcome.   REHAB POTENTIAL: Good  CLINICAL DECISION MAKING: Evolving/moderate complexity  EVALUATION COMPLEXITY: Moderate   PLAN:  PT FREQUENCY: 1-2x/week PT DURATION: 8 weeks  PLANNED INTERVENTIONS: 97164- PT Re-evaluation, 97750- Physical Performance Testing, 97110-Therapeutic exercises, 97530- Therapeutic activity, [id number]- Neuromuscular re-education, 97535- Self Care, [id number]- Manual therapy, [id number]- Gait training, [phone]- Orthotic Initial, [phone]- Orthotic/Prosthetic subsequent, [phone]- Canalith repositioning, Patient/Family education, Balance training, Stair training, Taping, Joint mobilization, Spinal mobilization, Vestibular training, Visual/preceptual remediation/compensation, DME instructions, Cryotherapy, Moist heat, and Biofeedback  PLAN FOR UPCOMING SESSIONS:  Hip strength in 4 directions.    8:53 AM, 04/08/24 Peggye JAYSON Linear, PT, DPT Physical Therapist - [facility]  Outpatient Physical Therapy- Main  Campus [phone]

## 2024-04-13 ENCOUNTER — Ambulatory Visit

## 2024-04-20 ENCOUNTER — Ambulatory Visit: Attending: Unknown Physician Specialty

## 2024-04-20 DIAGNOSIS — R2681 Unsteadiness on feet: Secondary | ICD-10-CM | POA: Diagnosis present

## 2024-04-20 DIAGNOSIS — R42 Dizziness and giddiness: Secondary | ICD-10-CM | POA: Insufficient documentation

## 2024-04-20 DIAGNOSIS — R296 Repeated falls: Secondary | ICD-10-CM | POA: Insufficient documentation

## 2024-04-20 DIAGNOSIS — R29898 Other symptoms and signs involving the musculoskeletal system: Secondary | ICD-10-CM | POA: Diagnosis present

## 2024-04-20 DIAGNOSIS — M6281 Muscle weakness (generalized): Secondary | ICD-10-CM | POA: Diagnosis present

## 2024-04-20 NOTE — Therapy (Signed)
 OUTPATIENT PHYSICAL THERAPY TREATMENT  Patient Name: Jennifer Frank MRN: [id number] DOB:1950-10-16, 73 y.o., female Today's Date: 04/20/2024  END OF SESSION:     PT End of Session - 04/20/24 1203     Visit Number 25    Number of Visits 40    Date for Recertification  04/30/24    Authorization Type [employer] Medicare    Authorization Time Period 03/04/24-04/30/24    Progress Note Due on Visit 30    PT Start Time 1155    PT Stop Time 1225    PT Time Calculation (min) 30 min    Equipment Utilized During Treatment Gait belt    Activity Tolerance Patient tolerated treatment well;No increased pain    Behavior During Therapy WFL for tasks assessed/performed         Past Medical History:  Diagnosis Date   Arthritis    Clotting disorder    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   Fundic gland polyps of stomach, benign    GERD (gastroesophageal reflux disease)    past hx    Hearing loss    IBS (irritable bowel syndrome)    Personal history of colonic polyps - adenoma 09/22/2013   PONV (postoperative nausea and vomiting)    Reflux    Past Surgical History:  Procedure Laterality Date   HIP ARTHROSCOPY Left 1997   KNEE ARTHROSCOPY Left 1996   KNEE ARTHROSCOPY Right    05-2016 same time as left knee replacement    POLYPECTOMY     TONSILLECTOMY AND ADENOIDECTOMY  1957   TOTAL KNEE ARTHROPLASTY Bilateral 05/27/2016   Procedure: LEFT TOTAL KNEE ARTHROPLASTY WITH RIGHT KNEE ARTHROSCOPY;  Surgeon: Dempsey Sensor, MD;  Location: [facility] OR;  Service: Orthopedics;  Laterality: Bilateral;   UPPER GASTROINTESTINAL ENDOSCOPY     WISDOM TOOTH EXTRACTION     Patient Active Problem List   Diagnosis Date Noted   Syncope 09/14/2023   Other social stressor 07/09/2023   Insomnia 07/09/2023   Back pain 03/12/2023   Skin irritation 03/12/2023   Tremor 09/08/2022   Osteopenia 02/11/2022   SOB (shortness of breath) 05/27/2021   Aortic atherosclerosis 03/12/2020   Medicare annual wellness visit,  subsequent 01/10/2019   Advance care planning 12/14/2017   Urinary incontinence 12/14/2017   Primary osteoarthritis of left knee 05/26/2016   Health care maintenance 04/22/2016   Hearing loss 04/22/2014   Varicose veins of bilateral lower extremities with other complications 01/25/2013   Edema 01/25/2013   Hyperlipidemia 02/16/2007   GERD 02/16/2007    PCP: [name], MD REFERRING PROVIDER: [name], MD   REFERRING DIAG: R42 (ICD-10-CM) - Dizziness   THERAPY DIAG:  Muscle weakness (generalized)  Weakness of lower extremity, unspecified laterality  Dizziness and giddiness  Repeated falls  Unsteadiness on feet  ONSET DATE: Around February 2025  Rationale for Evaluation and Treatment: Rehabilitation  SUBJECTIVE:   SUBJECTIVE STATEMENT: Ankle edema is improving, pt in thigh high compression today. Pt denies any major updates since last session. No falls or dizzy issues since last session.   PERTINENT HISTORY: Arthritis, clotting disorder, GERD, IBS, L TKA, spastic bladder.  From Initial Eval: Pt states 2 weeks ago her PCP, Dr. Cleatus, referred her to cardiology and they did an EKG showing normal sinus rhythm. States she did wear heart monitor for 7 days with potential abnormal rhythm at night of tachycardia. Pt states cardiologist felt she had vertigo and referred her to ENT. Pt states a few years ago she  was told she had loss of hearing. Pt states she has learned to lip read really well since then while she saves up money to afford hearing aides. States she visited ENT and had debridement of her ears and they confirmed she needs hearing aides. Pt states ENT didn't see vertigo and referred her to vestibular rehab. Pt states she has frequent falls and fell this morning when getting up from seated position with L LOB. States she often falls to her LEFT, but sometimes face forward. States when she stands up she has to stand for a little while because if she moves too  quick everything is revolving/spinning and she gets nauseous. Pt states last year or beginning of this year she had EMG and MRI, then was referred to PT for strength training. Pt states imaging found lumbar spine has bulging discs and that the physical therapist at the time discontinued care due to concern for exacerbating her lumbar spine symptoms. Pt states she did her exercises for a little while, but then gradually stopped doing them. Pt states she uses rollator when her husband is not with her for security and to have seat in tow. Pt states she has sudden onset anxious feeling with no known provocation and then she starts pushing AD too far out in front of her causing instability.  Pt says she has tremors in R hand and she was assessed for Parkinson's due to having family hx on her dad's side. Pt states she saw Neurologist last year and they said she didn't have Parkinson's. Pt also reports having quivering in R LE. Pt reports this morning she had some numbness down her R UE down to her middle finger, but it is gone now. Pt states if she stands with both of her hands up over her head she will blackout and pass out because she was doing a task overhead years ago and this happened. States she can raise 1 arm overhead and she is OK.  PAIN:  Are you having pain? No  PRECAUTIONS: Fall  WEIGHT BEARING RESTRICTIONS: No  FALLS: Has patient fallen in last 6 months? Yes (2-3x) as of 04/08/24  PLOF: Independent with basic ADLs, Independent with household mobility without device, Independent with community mobility without device, and Independent with homemaking with ambulation  PATIENT GOALS: improve dizziness, decrease number of falls, improve balance  OBJECTIVE:  Note: Objective measures were completed at Evaluation unless otherwise noted.  DIAGNOSTIC FINDINGS:   Pelvic CT 2024 IMPRESSION: 1. Normal appearance of the sacral plexus and proximal sciatic nerves bilaterally. 2. Mild left  iliopsoas bursitis. 3. Small and atrophic iliopsoas muscles bilaterally in a symmetric manner not substantially changed from 06/17/2021. Fluid signal deep to the iliacus musculature, nonspecific and likely chronic. 4. Substantial atrophy of the upper hip adductor musculature, although not disproportionate to that shown on 06/17/2021. 5. Disc desiccation at L4-5 and L5-S1.  LOWER EXTREMITY MMT:   MMT  Need to be assessed  Right  12/26/2023  Right  03/09/24 Left  12/26/2023 Left   03/09/24 Left   04/20/24  Hip flexion 2- 4/5^ 2- 4+/5^   Hip extension        Hip abduction 4+ 4-/5 4+ 3+/5   Hip adduction 4+ N/a 4+ N/a   Hip internal rotation  5/5  4+/5   Hip external rotation  3+/5  4-/5   Knee flexion 4+ 5/5 3+ 5/5   Knee extension 4+ 5/5 4 5/5   Ankle dorsiflexion 4+ 5/5 4+ 5/5 5/5  bilat  Ankle plantarflexion 4  4    (Blank rows = not tested)  *reports pain in R thigh/quad muscle and R anterior hip  OTHOSTATICS:  12/23/23:  116/64 68bpm supine (no symptoms)  118/61 67bpm seated (no symptoms)  116/65 71bpm standing (no symptoms)  114/68 70bpm standing x 60 sec (no symptoms)  (Negative) *also negative with cardiology   03/09/24: 125/20mmHg 73bpm (supine)  121/74mmHg 72bpm (sitting)  110/62mmHg 81bpm (standing)  119/69 mmHg 81bpm Standing at 1 minutes   04/08/24 132/74 mmHg 62 bpm seated  133/106mmHg 70bpm standing  GAIT MECHANICS 04/20/24:   -AMB overground notable for bilat hip instability/trendelenburg in frontal plane; moderate left DJD genu valgus and heavy left lateral lean on LLE may be primary contributer to Left foot drop, particularly given chronic weakness in hip flexion which limits excursion of swing phase on LLE. Pt persists with Left foot drop paradoxically with 5/5 ankle DF strength and adequate DF ROM.                                                                                                                          TREATMENT DATE:  04/20/2024  -AMB overground, no device, 1061ft: 29m31s; Left foot drop seems worse today; -MMT bilat ankles;  5/5 ankle DF bilat  -AMB overground, no device, 1035ft: 74m36s; improvement in lateral hip stability, however more difficulty with trunk in frontal plane, same tendency with LLE foot scuff, increased scuff on Right.  -seated recovery   -wall leaning ankle DF x15  - standing double heel raise x20  -side stepping in // bars 4x bilat   PATIENT EDUCATION: Education details: Plan for moving forward remaining goals for strength.    Person educated: Patient Education method: Explanation and Handouts Education comprehension: verbalized understanding and needs further education  HOME EXERCISE PROGRAM: Access Code: [email or web address] URL: [email or web address] Date: 03/11/2024 Prepared by: Connell Kiss  Exercises - Supine Single Leg Hip and Knee Flexion ROM with Swiss Ball  - 2 x daily - 5 x weekly - 2 sets - 15 reps - Supine Heel Slide with Strap  - 1 x daily - 5 x weekly - 2 sets - 5 reps - Supine Bridge with Resistance Band  - 1 x daily - 5 x weekly - 3 sets - 15 reps - Hooklying Isometric Clamshell  - 1 x daily - 5 x weekly - 3 sets - 15 reps - Sit to Stand with Arms Crossed  - 1 x daily - 5 x weekly - 2 sets - 10 reps - Standing March with Counter Support  - 1 x daily - 7 x weekly - 2 sets - 10-15 reps - Narrow Stance with Head Nods and Counter Support  - 1 x daily - 7 x weekly - 2 sets - 10 reps  Prior Vestibular Exercises (removed from [facility] on 02/19/24)  GOALS: Goals reviewed with patient? Yes  SHORT TERM GOALS: Target date: 04/02/2024  Patient will be independent in home  exercise program to improve gaze stabilization and/or mobility for improved functional independence with ADLs while experiencing decreased dizziness symptoms. Baseline: initiated on 12/23/2023 02/05/2024: last updated on 01/13/2024 Goal status: ACHIEVED   LONG TERM GOALS: Target date:  04/30/2024 Patient will reduce dizziness handicap inventory [facility]) score to <36, for less dizziness with ADLs and increased safety with home and work tasks.  Baseline: 48% 02/05/2024: 14% Goal status: MET  2.  Patient will deny any falls over past 4 weeks to demonstrate improved balance and safe independence with functional mobility.  Baseline: reports frequent falls with 1 on day of initial eval 02/05/2024: 0 falls in past 4 weeks Goal status: MET   3.  Patient will increase Berg Balance score to > 51/56 to demonstrate improved balance and decreased fall risk during functional activities and ADLs.  Baseline: 01/15/2024: 49/56; 02/05/2024: 51/56; 03/25/24: 52/56 Goal status: MET  4.  Patient will increase 10 meter walk test to >1.74m/s using LRAD as to improve gait speed for better community ambulation and to reduce fall risk. Baseline: 0.44m/s  02/05/2024: Average Normal speed: 0.814 m/s & Average Fast speed: 1.065 m/s, no AD, independently  03/25/24: Average Normal speed: 0.99 m/s; Average Fast speed: 1.2 m/s Goal status: ACHIEVED  5.  Patient will increase Functional Gait Assessment (FGA) score to >20/30 as to reduce fall risk and improve dynamic gait safety with community ambulation.  Baseline: 15/30; 02/05/2024: 16/30; 03/25/24: 23/30;  Goal status: MET  ASSESSMENT:  CLINICAL IMPRESSION:   Hypotension remains improved. Continued to maximize ankle and hip strength vital for righting reactions. Prior chart review notable for CT imaging > 2 years ago correlating atrophic changes to bilat iliospoas and pectineus, would explain focal strength impairments about the hip, seems to be contributing to flat foot strike in gait, mor enotable today with scuffing and toe catching Lt>Rt. Patient will benefit from skilled physical therapy intervention to reduce deficits and impairments identified in evaluation, in order to reduce pain, improve quality of life, and maximize activity tolerance for ADL, IADL, and  leisure/fitness. Physical therapy will help pt achieve long and short term goals of care.    OBJECTIVE IMPAIRMENTS: Abnormal gait, cardiopulmonary status limiting activity, decreased activity tolerance, decreased balance, decreased coordination, decreased endurance, decreased knowledge of condition, decreased knowledge of use of DME, decreased mobility, difficulty walking, decreased strength, and dizziness.   ACTIVITY LIMITATIONS: carrying, lifting, bending, standing, squatting, transfers, bed mobility, bathing, toileting, dressing, reach over head, hygiene/grooming, and locomotion level  PARTICIPATION LIMITATIONS: meal prep, cleaning, laundry, shopping, community activity, and yard work  PERSONAL FACTORS: Age, Time since onset of injury/illness/exacerbation, and 3+ comorbidities: Arthritis, clotting disorder, GERD, IBS, L TKA, spastic bladder are also affecting patient's functional outcome.   REHAB POTENTIAL: Good  CLINICAL DECISION MAKING: Evolving/moderate complexity  EVALUATION COMPLEXITY: Moderate   PLAN:  PT FREQUENCY: 1-2x/week PT DURATION: 8 weeks  PLANNED INTERVENTIONS: 97164- PT Re-evaluation, 97750- Physical Performance Testing, 97110-Therapeutic exercises, 97530- Therapeutic activity, [id number]- Neuromuscular re-education, 97535- Self Care, [id number]- Manual therapy, [id number]- Gait training, [phone]- Orthotic Initial, [phone]- Orthotic/Prosthetic subsequent, [phone]- Canalith repositioning, Patient/Family education, Balance training, Stair training, Taping, Joint mobilization, Spinal mobilization, Vestibular training, Visual/preceptual remediation/compensation, DME instructions, Cryotherapy, Moist heat, and Biofeedback  PLAN FOR UPCOMING SESSIONS:  Hip strength in 4 directions FU on Left foot drop?     12:05 PM, 04/20/24 Peggye JAYSON Linear, PT, DPT Physical Therapist - [facility]  Outpatient Physical Therapy- Main Campus [phone]

## 2024-04-22 ENCOUNTER — Ambulatory Visit

## 2024-04-22 DIAGNOSIS — M6281 Muscle weakness (generalized): Secondary | ICD-10-CM

## 2024-04-22 DIAGNOSIS — R2681 Unsteadiness on feet: Secondary | ICD-10-CM

## 2024-04-22 DIAGNOSIS — R296 Repeated falls: Secondary | ICD-10-CM

## 2024-04-22 DIAGNOSIS — R42 Dizziness and giddiness: Secondary | ICD-10-CM

## 2024-04-22 DIAGNOSIS — R29898 Other symptoms and signs involving the musculoskeletal system: Secondary | ICD-10-CM

## 2024-04-22 NOTE — Therapy (Signed)
 OUTPATIENT PHYSICAL THERAPY TREATMENT  Patient Name: Jennifer Frank MRN: [id number] DOB:[birth year], 73 y.o., female Today's Date: 04/22/2024  END OF SESSION:     PT End of Session - 04/22/24 1020     Visit Number 26    Number of Visits 40    Date for Recertification  04/30/24    Authorization Type [employer]BS Medicare    Authorization Time Period 03/04/24-04/30/24    Progress Note Due on Visit 30    PT Start Time 1015    PT Stop Time 1055    PT Time Calculation (min) 40 min    Equipment Utilized During Treatment Gait belt    Activity Tolerance Patient tolerated treatment well;No increased pain    Behavior During Therapy WFL for tasks assessed/performed         Past Medical History:  Diagnosis Date   Arthritis    Clotting disorder    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   Fundic gland polyps of stomach, benign    GERD (gastroesophageal reflux disease)    past hx    Hearing loss    IBS (irritable bowel syndrome)    Personal history of colonic polyps - adenoma 09/22/2013   PONV (postoperative nausea and vomiting)    Reflux    Past Surgical History:  Procedure Laterality Date   HIP ARTHROSCOPY Left 1997   KNEE ARTHROSCOPY Left 1996   KNEE ARTHROSCOPY Right    05-2016 same time as left knee replacement    POLYPECTOMY     TONSILLECTOMY AND ADENOIDECTOMY  1957   TOTAL KNEE ARTHROPLASTY Bilateral 05/27/2016   Procedure: LEFT TOTAL KNEE ARTHROPLASTY WITH RIGHT KNEE ARTHROSCOPY;  Surgeon: Dempsey Sensor, MD;  Location: [facility] OR;  Service: Orthopedics;  Laterality: Bilateral;   UPPER GASTROINTESTINAL ENDOSCOPY     WISDOM TOOTH EXTRACTION     Patient Active Problem List   Diagnosis Date Noted   Syncope 09/14/2023   Other social stressor 07/09/2023   Insomnia 07/09/2023   Back pain 03/12/2023   Skin irritation 03/12/2023   Tremor 09/08/2022   Osteopenia 02/11/2022   SOB (shortness of breath) 05/27/2021   Aortic atherosclerosis 03/12/2020   Medicare annual wellness visit,  subsequent 01/10/2019   Advance care planning 12/14/2017   Urinary incontinence 12/14/2017   Primary osteoarthritis of left knee 05/26/2016   Health care maintenance 04/22/2016   Hearing loss 04/22/2014   Varicose veins of bilateral lower extremities with other complications 01/25/2013   Edema 01/25/2013   Hyperlipidemia 02/16/2007   GERD 02/16/2007    PCP: [name], MD REFERRING PROVIDER: [name], MD   REFERRING DIAG: R42 (ICD-10-CM) - Dizziness   THERAPY DIAG:  Muscle weakness (generalized)  Weakness of lower extremity, unspecified laterality  Dizziness and giddiness  Repeated falls  Unsteadiness on feet  ONSET DATE: Around February 2025  Rationale for Evaluation and Treatment: Rehabilitation  SUBJECTIVE:   SUBJECTIVE STATEMENT: Pt says she was walking [place], her steps got carried away from her and she stumbled into a manikin at the tux shop, fall arrested by a family member. She continues to have difficulty with her legs getting out of control like a run away train.   PERTINENT HISTORY: Arthritis, clotting disorder, GERD, IBS, L TKA, spastic bladder.  From Initial Eval: Pt states 2 weeks ago her PCP, Dr. Cleatus, referred her to cardiology and they did an EKG showing normal sinus rhythm. States she did wear heart monitor for 7 days with potential abnormal rhythm at  night of tachycardia. Pt states cardiologist felt she had vertigo and referred her to ENT. Pt states a few years ago she was told she had loss of hearing. Pt states she has learned to lip read really well since then while she saves up money to afford hearing aides. States she visited ENT and had debridement of her ears and they confirmed she needs hearing aides. Pt states ENT didn't see vertigo and referred her to vestibular rehab. Pt states she has frequent falls and fell this morning when getting up from seated position with L LOB. States she often falls to her LEFT, but sometimes face  forward. States when she stands up she has to stand for a little while because if she moves too quick everything is revolving/spinning and she gets nauseous. Pt states last year or beginning of this year she had EMG and MRI, then was referred to PT for strength training. Pt states imaging found lumbar spine has bulging discs and that the physical therapist at the time discontinued care due to concern for exacerbating her lumbar spine symptoms. Pt states she did her exercises for a little while, but then gradually stopped doing them. Pt states she uses rollator when her husband is not with her for security and to have seat in tow. Pt states she has sudden onset anxious feeling with no known provocation and then she starts pushing AD too far out in front of her causing instability.  Pt says she has tremors in R hand and she was assessed for Parkinson's due to having family hx on her dad's side. Pt states she saw Neurologist last year and they said she didn't have Parkinson's. Pt also reports having quivering in R LE. Pt reports this morning she had some numbness down her R UE down to her middle finger, but it is gone now. Pt states if she stands with both of her hands up over her head she will blackout and pass out because she was doing a task overhead years ago and this happened. States she can raise 1 arm overhead and she is OK.  PAIN:  Are you having pain? No  PRECAUTIONS: Fall  WEIGHT BEARING RESTRICTIONS: No  FALLS: Has patient fallen in last 6 months? Yes (2-3x) as of 04/08/24  PLOF: Independent with basic ADLs, Independent with household mobility without device, Independent with community mobility without device, and Independent with homemaking with ambulation  PATIENT GOALS: improve dizziness, decrease number of falls, improve balance  OBJECTIVE:  Note: Objective measures were completed at Evaluation unless otherwise noted.  DIAGNOSTIC FINDINGS:   Pelvic CT 2024 IMPRESSION: 1. Normal  appearance of the sacral plexus and proximal sciatic nerves bilaterally. 2. Mild left iliopsoas bursitis. 3. Small and atrophic iliopsoas muscles bilaterally in a symmetric manner not substantially changed from 06/17/2021. Fluid signal deep to the iliacus musculature, nonspecific and likely chronic. 4. Substantial atrophy of the upper hip adductor musculature, although not disproportionate to that shown on 06/17/2021. 5. Disc desiccation at L4-5 and L5-S1.  LOWER EXTREMITY MMT:   MMT  Need to be assessed  Right  12/26/2023  Right  03/09/24 Left  12/26/2023 Left   03/09/24 Left   04/20/24  Hip flexion 2- 4/5^ 2- 4+/5^   Hip extension        Hip abduction 4+ 4-/5 4+ 3+/5   Hip adduction 4+ N/a 4+ N/a   Hip internal rotation  5/5  4+/5   Hip external rotation  3+/5  4-/5   Knee  flexion 4+ 5/5 3+ 5/5   Knee extension 4+ 5/5 4 5/5   Ankle dorsiflexion 4+ 5/5 4+ 5/5 5/5 bilat  Ankle plantarflexion 4  4    (Blank rows = not tested)  *reports pain in R thigh/quad muscle and R anterior hip  OTHOSTATICS:  12/23/23:  116/64 68bpm supine (no symptoms)  118/61 67bpm seated (no symptoms)  116/65 71bpm standing (no symptoms)  114/68 70bpm standing x 60 sec (no symptoms)  (Negative) *also negative with cardiology   03/09/24: 125/80mmHg 73bpm (supine)  121/42mmHg 72bpm (sitting)  110/73mmHg 81bpm (standing)  119/69 mmHg 81bpm Standing at 1 minutes   04/08/24 132/74 mmHg 62 bpm seated  133/52mmHg 70bpm standing  GAIT MECHANICS 04/20/24:   -AMB overground notable for bilat hip instability/trendelenburg in frontal plane; moderate left DJD genu valgus and heavy left lateral lean on LLE may be primary contributer to Left foot drop, particularly given chronic weakness in hip flexion which limits excursion of swing phase on LLE. Pt persists with Left foot drop paradoxically with 5/5 ankle DF strength and adequate DF ROM.                                                                                                                           TREATMENT DATE: 04/22/2024 -1028ft AMB, no device, 8m40sec, 2.5lb AW LLE only (no scuffing, curiously)  *seated recovery  -With 7lb AW and 1-2 railings: up 20 stairs, down 20 stairs  *seated recovery~ 2 minutes -With 7lb AW and 1-2 railings: up 20 stairs, down 20 stairs  *seated recovery ~ 2 minutes -With 7lb AW and 1-2 railings: up 20 stairs, down 16 stairs  *seated recovery~ 2 minutes Progressive pain increase and fatigue of Rt upper adductors each time (primary hip flexor)   -lateral side stepping in // bars with 5lb AW bilat (3x bilat) -STS from chair + airex pad x12  -lateral side stepping in // bars with 5lb AW bilat (3x bilat) -STS from chair + airex pad x12    PATIENT EDUCATION: Education details: Plan for moving forward remaining goals for strength.    Person educated: Patient Education method: Explanation and Handouts Education comprehension: verbalized understanding and needs further education  HOME EXERCISE PROGRAM: Access Code: [email or web address] URL: [email or web address] Date: 03/11/2024 Prepared by: Connell Kiss  Exercises - Supine Single Leg Hip and Knee Flexion ROM with Swiss Ball  - 2 x daily - 5 x weekly - 2 sets - 15 reps - Supine Heel Slide with Strap  - 1 x daily - 5 x weekly - 2 sets - 5 reps - Supine Bridge with Resistance Band  - 1 x daily - 5 x weekly - 3 sets - 15 reps - Hooklying Isometric Clamshell  - 1 x daily - 5 x weekly - 3 sets - 15 reps - Sit to Stand with Arms Crossed  - 1 x daily - 5 x weekly - 2 sets - 10 reps - Standing March with Counter  Support  - 1 x daily - 7 x weekly - 2 sets - 10-15 reps - Narrow Stance with Head Nods and Counter Support  - 1 x daily - 7 x weekly - 2 sets - 10 reps  Prior Vestibular Exercises (removed from [facility] on 02/19/24)  GOALS: Goals reviewed with patient? Yes  SHORT TERM GOALS: Target date: 04/02/2024  Patient will be independent in home exercise  program to improve gaze stabilization and/or mobility for improved functional independence with ADLs while experiencing decreased dizziness symptoms. Baseline: initiated on 12/23/2023 02/05/2024: last updated on 01/13/2024 Goal status: ACHIEVED   LONG TERM GOALS: Target date: 04/30/2024 Patient will reduce dizziness handicap inventory [facility]) score to <36, for less dizziness with ADLs and increased safety with home and work tasks.  Baseline: 48% 02/05/2024: 14% Goal status: MET  2.  Patient will deny any falls over past 4 weeks to demonstrate improved balance and safe independence with functional mobility.  Baseline: reports frequent falls with 1 on day of initial eval 02/05/2024: 0 falls in past 4 weeks Goal status: MET   3.  Patient will increase Berg Balance score to > 51/56 to demonstrate improved balance and decreased fall risk during functional activities and ADLs.  Baseline: 01/15/2024: 49/56; 02/05/2024: 51/56; 03/25/24: 52/56 Goal status: MET  4.  Patient will increase 10 meter walk test to >1.87m/s using LRAD as to improve gait speed for better community ambulation and to reduce fall risk. Baseline: 0.15m/s  02/05/2024: Average Normal speed: 0.814 m/s & Average Fast speed: 1.065 m/s, no AD, independently  03/25/24: Average Normal speed: 0.99 m/s; Average Fast speed: 1.2 m/s Goal status: ACHIEVED  5.  Patient will increase Functional Gait Assessment (FGA) score to >20/30 as to reduce fall risk and improve dynamic gait safety with community ambulation.  Baseline: 15/30; 02/05/2024: 16/30; 03/25/24: 23/30;  Goal status: MET  ASSESSMENT:  CLINICAL IMPRESSION:   Hypotension remains improved. Overground AMB void of scuffing today despite addition of Left ankle weight. Returned to hight volume gait based hip strengthening. Prior chart review notable for CT imaging > 2 years ago correlating atrophic changes to bilat iliospoas and pectineus, would explain focal strength impairments about the hip,  seems to be contributing to flat foot strike in gait, mor enotable today with scuffing and toe catching  STS transfers are looking more powerful today than previously. Lt>Rt. Pt has more gradual onset of Rt groin pain with stairs exercise over time. Patient will benefit from skilled physical therapy intervention to reduce deficits and impairments identified in evaluation, in order to reduce pain, improve quality of life, and maximize activity tolerance for ADL, IADL, and leisure/fitness. Physical therapy will help pt achieve long and short term goals of care.    OBJECTIVE IMPAIRMENTS: Abnormal gait, cardiopulmonary status limiting activity, decreased activity tolerance, decreased balance, decreased coordination, decreased endurance, decreased knowledge of condition, decreased knowledge of use of DME, decreased mobility, difficulty walking, decreased strength, and dizziness.   ACTIVITY LIMITATIONS: carrying, lifting, bending, standing, squatting, transfers, bed mobility, bathing, toileting, dressing, reach over head, hygiene/grooming, and locomotion level  PARTICIPATION LIMITATIONS: meal prep, cleaning, laundry, shopping, community activity, and yard work  PERSONAL FACTORS: Age, Time since onset of injury/illness/exacerbation, and 3+ comorbidities: Arthritis, clotting disorder, GERD, IBS, L TKA, spastic bladder are also affecting patient's functional outcome.   REHAB POTENTIAL: Good  CLINICAL DECISION MAKING: Evolving/moderate complexity  EVALUATION COMPLEXITY: Moderate   PLAN:  PT FREQUENCY: 1-2x/week PT DURATION: 8 weeks  PLANNED INTERVENTIONS: 02835- PT Re-evaluation,  02249- Physical Performance Testing, 97110-Therapeutic exercises, 97530- Therapeutic activity, [id number]- Neuromuscular re-education, [phone]- Self Care, 02859- Manual therapy, [phone]- Gait training, [phone]- Orthotic Initial, [phone]- Orthotic/Prosthetic subsequent, [phone]- Canalith repositioning, Patient/Family education, Balance training,  Stair training, Taping, Joint mobilization, Spinal mobilization, Vestibular training, Visual/preceptual remediation/compensation, DME instructions, Cryotherapy, Moist heat, and Biofeedback  PLAN FOR UPCOMING SESSIONS:  Hip strength in 4 directions   10:22 AM, 04/22/24 Peggye JAYSON Linear, PT, DPT Physical Therapist - [facility]  Outpatient Physical Therapy- Main Campus [phone]

## 2024-04-27 ENCOUNTER — Ambulatory Visit

## 2024-04-27 DIAGNOSIS — R2681 Unsteadiness on feet: Secondary | ICD-10-CM

## 2024-04-27 DIAGNOSIS — R296 Repeated falls: Secondary | ICD-10-CM

## 2024-04-27 DIAGNOSIS — R42 Dizziness and giddiness: Secondary | ICD-10-CM

## 2024-04-27 DIAGNOSIS — R29898 Other symptoms and signs involving the musculoskeletal system: Secondary | ICD-10-CM

## 2024-04-27 DIAGNOSIS — M6281 Muscle weakness (generalized): Secondary | ICD-10-CM | POA: Diagnosis not present

## 2024-04-27 NOTE — Therapy (Signed)
 OUTPATIENT PHYSICAL THERAPY TREATMENT  Patient Name: Jennifer Frank MRN: [id number] DOB:21-Jul-1950, 73 y.o., female Today's Date: 04/27/2024  END OF SESSION:     PT End of Session - 04/27/24 1153     Visit Number 27    Number of Visits 40    Date for Recertification  04/30/24    Authorization Type [employer] Medicare    Authorization Time Period 03/04/24-04/30/24    Progress Note Due on Visit 30    PT Start Time 1150    PT Stop Time 1230    PT Time Calculation (min) 40 min    Equipment Utilized During Treatment Gait belt    Activity Tolerance Patient tolerated treatment well;No increased pain    Behavior During Therapy WFL for tasks assessed/performed         Past Medical History:  Diagnosis Date   Arthritis    Clotting disorder    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   Fundic gland polyps of stomach, benign    GERD (gastroesophageal reflux disease)    past hx    Hearing loss    IBS (irritable bowel syndrome)    Personal history of colonic polyps - adenoma 09/22/2013   PONV (postoperative nausea and vomiting)    Reflux    Past Surgical History:  Procedure Laterality Date   HIP ARTHROSCOPY Left 1997   KNEE ARTHROSCOPY Left 1996   KNEE ARTHROSCOPY Right    05-2016 same time as left knee replacement    POLYPECTOMY     TONSILLECTOMY AND ADENOIDECTOMY  1957   TOTAL KNEE ARTHROPLASTY Bilateral 05/27/2016   Procedure: LEFT TOTAL KNEE ARTHROPLASTY WITH RIGHT KNEE ARTHROSCOPY;  Surgeon: Dempsey Sensor, MD;  Location: [facility] OR;  Service: Orthopedics;  Laterality: Bilateral;   UPPER GASTROINTESTINAL ENDOSCOPY     WISDOM TOOTH EXTRACTION     Patient Active Problem List   Diagnosis Date Noted   Syncope 09/14/2023   Other social stressor 07/09/2023   Insomnia 07/09/2023   Back pain 03/12/2023   Skin irritation 03/12/2023   Tremor 09/08/2022   Osteopenia 02/11/2022   SOB (shortness of breath) 05/27/2021   Aortic atherosclerosis 03/12/2020   Medicare annual wellness visit,  subsequent 01/10/2019   Advance care planning 12/14/2017   Urinary incontinence 12/14/2017   Primary osteoarthritis of left knee 05/26/2016   Health care maintenance 04/22/2016   Hearing loss 04/22/2014   Varicose veins of bilateral lower extremities with other complications 01/25/2013   Edema 01/25/2013   Hyperlipidemia 02/16/2007   GERD 02/16/2007    PCP: [name], MD REFERRING PROVIDER: [name], MD   REFERRING DIAG: R42 (ICD-10-CM) - Dizziness   THERAPY DIAG:  Muscle weakness (generalized)  Weakness of lower extremity, unspecified laterality  Dizziness and giddiness  Repeated falls  Unsteadiness on feet  ONSET DATE: Around February 2025  Rationale for Evaluation and Treatment: Rehabilitation  SUBJECTIVE:   SUBJECTIVE STATEMENT: Pt says she is doing well today. No major updates. No falls since last session.   PERTINENT HISTORY: Arthritis, clotting disorder, GERD, IBS, L TKA, spastic bladder.  From Initial Eval: Pt states 2 weeks ago her PCP, Dr. Cleatus, referred her to cardiology and they did an EKG showing normal sinus rhythm. States she did wear heart monitor for 7 days with potential abnormal rhythm at night of tachycardia. Pt states cardiologist felt she had vertigo and referred her to ENT. Pt states a few years ago she was told she had loss of hearing. Pt states she has  learned to lip read really well since then while she saves up money to afford hearing aides. States she visited ENT and had debridement of her ears and they confirmed she needs hearing aides. Pt states ENT didn't see vertigo and referred her to vestibular rehab. Pt states she has frequent falls and fell this morning when getting up from seated position with L LOB. States she often falls to her LEFT, but sometimes face forward. States when she stands up she has to stand for a little while because if she moves too quick everything is revolving/spinning and she gets nauseous. Pt states last  year or beginning of this year she had EMG and MRI, then was referred to PT for strength training. Pt states imaging found lumbar spine has bulging discs and that the physical therapist at the time discontinued care due to concern for exacerbating her lumbar spine symptoms. Pt states she did her exercises for a little while, but then gradually stopped doing them. Pt states she uses rollator when her husband is not with her for security and to have seat in tow. Pt states she has sudden onset anxious feeling with no known provocation and then she starts pushing AD too far out in front of her causing instability.  Pt says she has tremors in R hand and she was assessed for Parkinson's due to having family hx on her dad's side. Pt states she saw Neurologist last year and they said she didn't have Parkinson's. Pt also reports having quivering in R LE. Pt reports this morning she had some numbness down her R UE down to her middle finger, but it is gone now. Pt states if she stands with both of her hands up over her head she will blackout and pass out because she was doing a task overhead years ago and this happened. States she can raise 1 arm overhead and she is OK.  PAIN:  Are you having pain? No  PRECAUTIONS: Fall  WEIGHT BEARING RESTRICTIONS: No  FALLS: Has patient fallen in last 6 months? Yes (2-3x) as of 04/08/24  PLOF: Independent with basic ADLs, Independent with household mobility without device, Independent with community mobility without device, and Independent with homemaking with ambulation  PATIENT GOALS: improve dizziness, decrease number of falls, improve balance  OBJECTIVE:  Note: Objective measures were completed at Evaluation unless otherwise noted.  DIAGNOSTIC FINDINGS:   Pelvic CT 2024 IMPRESSION: 1. Normal appearance of the sacral plexus and proximal sciatic nerves bilaterally. 2. Mild left iliopsoas bursitis. 3. Small and atrophic iliopsoas muscles bilaterally in a  symmetric manner not substantially changed from 06/17/2021. Fluid signal deep to the iliacus musculature, nonspecific and likely chronic. 4. Substantial atrophy of the upper hip adductor musculature, although not disproportionate to that shown on 06/17/2021. 5. Disc desiccation at L4-5 and L5-S1.  LOWER EXTREMITY MMT:   MMT  Need to be assessed  Right  12/26/2023  Right  03/09/24 Left  12/26/2023 Left   03/09/24 Left   04/20/24  Hip flexion 2- 4/5^ 2- 4+/5^   Hip extension        Hip abduction 4+ 4-/5 4+ 3+/5   Hip adduction 4+ N/a 4+ N/a   Hip internal rotation  5/5  4+/5   Hip external rotation  3+/5  4-/5   Knee flexion 4+ 5/5 3+ 5/5   Knee extension 4+ 5/5 4 5/5   Ankle dorsiflexion 4+ 5/5 4+ 5/5 5/5 bilat  Ankle plantarflexion 4  4    (Blank  rows = not tested)  *reports pain in R thigh/quad muscle and R anterior hip  OTHOSTATICS:  12/23/23:  116/64 68bpm supine (no symptoms)  118/61 67bpm seated (no symptoms)  116/65 71bpm standing (no symptoms)  114/68 70bpm standing x 60 sec (no symptoms)  (Negative) *also negative with cardiology   03/09/24: 125/56mmHg 73bpm (supine)  121/1mmHg 72bpm (sitting)  110/37mmHg 81bpm (standing)  119/69 mmHg 81bpm Standing at 1 minutes   04/08/24 132/74 mmHg 62 bpm seated  133/61mmHg 70bpm standing  GAIT MECHANICS 04/20/24:   -AMB overground notable for bilat hip instability/trendelenburg in frontal plane; moderate left DJD genu valgus and heavy left lateral lean on LLE may be primary contributer to Left foot drop, particularly given chronic weakness in hip flexion which limits excursion of swing phase on LLE. Pt persists with Left foot drop paradoxically with 5/5 ankle DF strength and adequate DF ROM.                                                                                                                          TREATMENT DATE: 04/27/2024 -1077ft AMB, no device, 23m36sec, 2.5lb AW LLE only (no scuffing, curiously)  *far  less dyspnea,  *seated recovery  -STS from standard chair, hand push off knee 8x (advanced chair height)  -STS from standard chair, hand push off knee 8x  -With 7lb AW and 1-2 railings: up 20 stairs, down 20 stairs  *seated recovery~ 90sec -With 7lb AW and 1-2 railings: up 20 stairs, down 20 stairs  *seated recovery~ 90sec  -Lateral stepping in // bars with 7.5lb cable resistance 3x Left, 3x Right  More difficulty stepping concentrically to Right   Prior session:  -1065ft AMB, no device,78m40sec, 2.5lb AW LLE only  -With 7lb AW and 1-2 railings: up 20 stairs, down 20 stairs  *seated recovery~ 2 minutes -With 7lb AW and 1-2 railings: up 20 stairs, down 20 stairs  *seated recovery ~ 2 minutes -With 7lb AW and 1-2 railings: up 20 stairs, down 16 stairs  *seated recovery~ 2 minutes Progressive pain increase and fatigue of Rt upper adductors each time (primary hip flexor)   -lateral side stepping in // bars with 5lb AW bilat (3x bilat) -STS from chair + airex pad x12  -lateral side stepping in // bars with 5lb AW bilat (3x bilat) -STS from chair + airex pad x12    PATIENT EDUCATION: Education details: Plan for post DC activity compliance and 6 week FU.   Person educated: Patient Education method: Explanation and Handouts Education comprehension: verbalized understanding and needs further education  HOME EXERCISE PROGRAM: Access Code: [email or web address] URL: [email or web address] Date: 03/11/2024 Prepared by: Connell Kiss  Exercises - Supine Single Leg Hip and Knee Flexion ROM with Swiss Ball  - 2 x daily - 5 x weekly - 2 sets - 15 reps - Supine Heel Slide with Strap  - 1 x daily - 5 x weekly - 2 sets - 5 reps - Supine Bridge with Resistance  Band  - 1 x daily - 5 x weekly - 3 sets - 15 reps - Hooklying Isometric Clamshell  - 1 x daily - 5 x weekly - 3 sets - 15 reps - Sit to Stand with Arms Crossed  - 1 x daily - 5 x weekly - 2 sets - 10 reps - Standing March with Counter  Support  - 1 x daily - 7 x weekly - 2 sets - 10-15 reps - Narrow Stance with Head Nods and Counter Support  - 1 x daily - 7 x weekly - 2 sets - 10 reps   GOALS: Goals reviewed with patient? Yes  SHORT TERM GOALS: Target date: 04/02/2024  Patient will be independent in home exercise program to improve gaze stabilization and/or mobility for improved functional independence with ADLs while experiencing decreased dizziness symptoms. Baseline: initiated on 12/23/2023 02/05/2024: last updated on 01/13/2024 Goal status: ACHIEVED   LONG TERM GOALS: Target date: 04/30/2024 Patient will reduce dizziness handicap inventory [facility]) score to <36, for less dizziness with ADLs and increased safety with home and work tasks.  Baseline: 48% 02/05/2024: 14% Goal status: MET  2.  Patient will deny any falls over past 4 weeks to demonstrate improved balance and safe independence with functional mobility.  Baseline: reports frequent falls with 1 on day of initial eval 02/05/2024: 0 falls in past 4 weeks Goal status: MET   3.  Patient will increase Berg Balance score to > 51/56 to demonstrate improved balance and decreased fall risk during functional activities and ADLs.  Baseline: 01/15/2024: 49/56; 02/05/2024: 51/56; 03/25/24: 52/56 Goal status: MET  4.  Patient will increase 10 meter walk test to >1.74m/s using LRAD as to improve gait speed for better community ambulation and to reduce fall risk. Baseline: 0.22m/s  02/05/2024: Average Normal speed: 0.814 m/s & Average Fast speed: 1.065 m/s, no AD, independently  03/25/24: Average Normal speed: 0.99 m/s; Average Fast speed: 1.2 m/s Goal status: ACHIEVED  5.  Patient will increase Functional Gait Assessment (FGA) score to >20/30 as to reduce fall risk and improve dynamic gait safety with community ambulation.  Baseline: 15/30; 02/05/2024: 16/30; 03/25/24: 23/30;  Goal status: MET  ASSESSMENT:  CLINICAL IMPRESSION:   Extensive discussion on remaining 2 visits and  post DC exercise/actiivty compliance. Pt landed on starting a walking program at the mall. Pt encouraged to look for her HEP handout to assure no new copy needed. Patient will benefit from skilled physical therapy intervention to reduce deficits and impairments identified in evaluation, in order to reduce pain, improve quality of life, and maximize activity tolerance for ADL, IADL, and leisure/fitness. Physical therapy will help pt achieve long and short term goals of care.    OBJECTIVE IMPAIRMENTS: Abnormal gait, cardiopulmonary status limiting activity, decreased activity tolerance, decreased balance, decreased coordination, decreased endurance, decreased knowledge of condition, decreased knowledge of use of DME, decreased mobility, difficulty walking, decreased strength, and dizziness.   ACTIVITY LIMITATIONS: carrying, lifting, bending, standing, squatting, transfers, bed mobility, bathing, toileting, dressing, reach over head, hygiene/grooming, and locomotion level  PARTICIPATION LIMITATIONS: meal prep, cleaning, laundry, shopping, community activity, and yard work  PERSONAL FACTORS: Age, Time since onset of injury/illness/exacerbation, and 3+ comorbidities: Arthritis, clotting disorder, GERD, IBS, L TKA, spastic bladder are also affecting patient's functional outcome.   REHAB POTENTIAL: Good  CLINICAL DECISION MAKING: Evolving/moderate complexity  EVALUATION COMPLEXITY: Moderate   PLAN:  PT FREQUENCY: 1-2x/week PT DURATION: 8 weeks  PLANNED INTERVENTIONS: 97164- PT Re-evaluation, 97750- Physical Performance Testing,  97110-Therapeutic exercises, 97530- Therapeutic activity, [id number]- Neuromuscular re-education, [phone]- Self Care, 02859- Manual therapy, [phone]- Gait training, [phone]- Orthotic Initial, [phone]- Orthotic/Prosthetic subsequent, [phone]- Canalith repositioning, Patient/Family education, Balance training, Stair training, Taping, Joint mobilization, Spinal mobilization, Vestibular training,  Visual/preceptual remediation/compensation, DME instructions, Cryotherapy, Moist heat, and Biofeedback  PLAN FOR UPCOMING SESSIONS:  Hip strength in 4 directions; last visit on 12/11 Thursday;    11:54 AM, 04/27/24 Peggye JAYSON Linear, PT, DPT Physical Therapist - [facility]  Outpatient Physical Therapy- Main Campus [phone]

## 2024-04-29 ENCOUNTER — Ambulatory Visit

## 2024-04-29 DIAGNOSIS — R296 Repeated falls: Secondary | ICD-10-CM

## 2024-04-29 DIAGNOSIS — M6281 Muscle weakness (generalized): Secondary | ICD-10-CM

## 2024-04-29 DIAGNOSIS — R29898 Other symptoms and signs involving the musculoskeletal system: Secondary | ICD-10-CM

## 2024-04-29 DIAGNOSIS — R42 Dizziness and giddiness: Secondary | ICD-10-CM

## 2024-04-29 DIAGNOSIS — R2681 Unsteadiness on feet: Secondary | ICD-10-CM

## 2024-04-29 NOTE — Therapy (Signed)
 OUTPATIENT PHYSICAL THERAPY TREATMENT/REASSESSMENT  Patient Name: Jennifer Frank MRN: [id number] DOB:[birth year], 73 y.o., female Today's Date: 04/29/2024  END OF SESSION:     PT End of Session - 04/29/24 1027     Visit Number 28    Number of Visits 40    Date for Recertification  07/22/24    Authorization Type BCBS Medicare    Authorization Time Period 03/04/24-04/30/24; next authr pending    Progress Note Due on Visit 30    PT Start Time 1017    PT Stop Time 1057    PT Time Calculation (min) 40 min    Equipment Utilized During Treatment Gait belt    Activity Tolerance Patient tolerated treatment well;No increased pain    Behavior During Therapy WFL for tasks assessed/performed         Past Medical History:  Diagnosis Date   Arthritis    Clotting disorder    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   Fundic gland polyps of stomach, benign    GERD (gastroesophageal reflux disease)    past hx    Hearing loss    IBS (irritable bowel syndrome)    Personal history of colonic polyps - adenoma 09/22/2013   PONV (postoperative nausea and vomiting)    Reflux    Past Surgical History:  Procedure Laterality Date   HIP ARTHROSCOPY Left 1997   KNEE ARTHROSCOPY Left 1996   KNEE ARTHROSCOPY Right    05-2016 same time as left knee replacement    POLYPECTOMY     TONSILLECTOMY AND ADENOIDECTOMY  1957   TOTAL KNEE ARTHROPLASTY Bilateral 05/27/2016   Procedure: LEFT TOTAL KNEE ARTHROPLASTY WITH RIGHT KNEE ARTHROSCOPY;  Surgeon: Dempsey Sensor, MD;  Location: [facility] OR;  Service: Orthopedics;  Laterality: Bilateral;   UPPER GASTROINTESTINAL ENDOSCOPY     WISDOM TOOTH EXTRACTION     Patient Active Problem List   Diagnosis Date Noted   Syncope 09/14/2023   Other social stressor 07/09/2023   Insomnia 07/09/2023   Back pain 03/12/2023   Skin irritation 03/12/2023   Tremor 09/08/2022   Osteopenia 02/11/2022   SOB (shortness of breath) 05/27/2021   Aortic atherosclerosis 03/12/2020    Medicare annual wellness visit, subsequent 01/10/2019   Advance care planning 12/14/2017   Urinary incontinence 12/14/2017   Primary osteoarthritis of left knee 05/26/2016   Health care maintenance 04/22/2016   Hearing loss 04/22/2014   Varicose veins of bilateral lower extremities with other complications 01/25/2013   Edema 01/25/2013   Hyperlipidemia 02/16/2007   GERD 02/16/2007    PCP: [name], MD REFERRING PROVIDER: [name], MD   REFERRING DIAG: R42 (ICD-10-CM) - Dizziness   THERAPY DIAG:  Muscle weakness (generalized)  Weakness of lower extremity, unspecified laterality  Dizziness and giddiness  Repeated falls  Unsteadiness on feet  ONSET DATE: Around February 2025  Rationale for Evaluation and Treatment: Rehabilitation  SUBJECTIVE:   SUBJECTIVE STATEMENT: Pt says she is doing well today. No major updates. No falls since last session.   PERTINENT HISTORY: Arthritis, clotting disorder, GERD, IBS, L TKA, spastic bladder.  From Initial Eval: Pt states 2 weeks ago her PCP, Dr. Cleatus, referred her to cardiology and they did an EKG showing normal sinus rhythm. States she did wear heart monitor for 7 days with potential abnormal rhythm at night of tachycardia. Pt states cardiologist felt she had vertigo and referred her to ENT. Pt states a few years ago she was told she had loss of hearing. Pt  states she has learned to lip read really well since then while she saves up money to afford hearing aides. States she visited ENT and had debridement of her ears and they confirmed she needs hearing aides. Pt states ENT didn't see vertigo and referred her to vestibular rehab. Pt states she has frequent falls and fell this morning when getting up from seated position with L LOB. States she often falls to her LEFT, but sometimes face forward. States when she stands up she has to stand for a little while because if she moves too quick everything is revolving/spinning and  she gets nauseous. Pt states last year or beginning of this year she had EMG and MRI, then was referred to PT for strength training. Pt states imaging found lumbar spine has bulging discs and that the physical therapist at the time discontinued care due to concern for exacerbating her lumbar spine symptoms. Pt states she did her exercises for a little while, but then gradually stopped doing them. Pt states she uses rollator when her husband is not with her for security and to have seat in tow. Pt states she has sudden onset anxious feeling with no known provocation and then she starts pushing AD too far out in front of her causing instability.  Pt says she has tremors in R hand and she was assessed for Parkinson's due to having family hx on her dad's side. Pt states she saw Neurologist last year and they said she didn't have Parkinson's. Pt also reports having quivering in R LE. Pt reports this morning she had some numbness down her R UE down to her middle finger, but it is gone now. Pt states if she stands with both of her hands up over her head she will blackout and pass out because she was doing a task overhead years ago and this happened. States she can raise 1 arm overhead and she is OK.  PAIN:  Are you having pain? No  PRECAUTIONS: Fall  WEIGHT BEARING RESTRICTIONS: No  FALLS: Has patient fallen in last 6 months? Yes (2-3x) as of 04/08/24  PLOF: Independent with basic ADLs, Independent with household mobility without device, Independent with community mobility without device, and Independent with homemaking with ambulation  PATIENT GOALS: improve dizziness, decrease number of falls, improve balance  OBJECTIVE:  Note: Objective measures were completed at Evaluation unless otherwise noted.  DIAGNOSTIC FINDINGS:   Pelvic CT 2024 IMPRESSION: 1. Normal appearance of the sacral plexus and proximal sciatic nerves bilaterally. 2. Mild left iliopsoas bursitis. 3. Small and atrophic  iliopsoas muscles bilaterally in a symmetric manner not substantially changed from 06/17/2021. Fluid signal deep to the iliacus musculature, nonspecific and likely chronic. 4. Substantial atrophy of the upper hip adductor musculature, although not disproportionate to that shown on 06/17/2021. 5. Disc desiccation at L4-5 and L5-S1.  LOWER EXTREMITY MMT:   MMT  Need to be assessed  Right  12/26/2023  Right  03/09/24 Right  04/29/24 Left  12/26/2023 Left   03/09/24 Left   04/29/24  Hip flexion 2- 4/5^ 4-/5* 2- 4+/5^ 4+/5  Hip extension         Hip horizontal ABDCT    5/5   5/5  HIp horizontal ADD   5/5   5/5  Hip abduction 4+ 4-/5  4+ 3+/5   Hip adduction 4+ N/a  4+ N/a   Hip internal rotation  5/5 5/5  4+/5 4+/5  Hip external rotation  3+/5 4+/5  4-/5 4+/5  Knee  flexion 4+ 5/5 5/5 3+ 5/5 5/5  Knee extension 4+ 5/5 5/5 4 5/5 5/5  Ankle dorsiflexion 4+ 5/5  4+ 5/5   Ankle plantarflexion 4   4    (Blank rows = not tested) *reports pain   OTHOSTATICS:  12/23/23:  116/64 68bpm supine (no symptoms)  118/61 67bpm seated (no symptoms)  116/65 71bpm standing (no symptoms)  114/68 70bpm standing x 60 sec (no symptoms)  (Negative) *also negative with cardiology   03/09/24: 125/64mmHg 73bpm (supine)  121/41mmHg 72bpm (sitting)  110/21mmHg 81bpm (standing)  119/69 mmHg 81bpm Standing at 1 minutes   04/08/24 132/74 mmHg 62 bpm seated  133/40mmHg 70bpm standing  GAIT MECHANICS 04/20/24:   -AMB overground notable for bilat hip instability/trendelenburg in frontal plane; moderate left DJD genu valgus and heavy left lateral lean on LLE may be primary contributer to Left foot drop, particularly given chronic weakness in hip flexion which limits excursion of swing phase on LLE. Pt persists with Left foot drop paradoxically with 5/5 ankle DF strength and adequate DF ROM.                                                                                                                           TREATMENT DATE: 04/29/2024 Review of goals HEP update 6 week FU plan Reassessment DGI, DHI, MMT  PATIENT EDUCATION: Education details: Plan for post DC activity compliance and 6 week FU.   Person educated: Patient Education method: Explanation and Handouts Education comprehension: verbalized understanding and needs further education  HOME EXERCISE PROGRAM: Access Code: [email or web address] URL: [email or web address] Date: 04/29/2024 Prepared by: Peggye Linear  Exercises - Supine Bridge with Resistance Band  - 5 x weekly - 3 sets - 15 reps - Sit to Stand with Arms Crossed  - 5 x weekly - 2 sets - 10 reps - Standing March with Counter Support  - 5 x weekly - 2 sets - 10-15 reps - Side Stepping with Counter Support  - 5 x weekly - 3 sets - 10 reps - Psychologist, Counselling with Single Rail Using Step-Through Pattern (Foot Over Foot)  - 5 x weekly - 3 sets - 10 reps   GOALS: Goals reviewed with patient? Yes  SHORT TERM GOALS: Target date: 04/02/2024  Patient will be independent in home exercise program to improve gaze stabilization and/or mobility for improved functional independence with ADLs while experiencing decreased dizziness symptoms. Baseline: initiated on 12/23/2023 02/05/2024: last updated on 01/13/2024 Goal status: ACHIEVED   LONG TERM GOALS: Target date: 04/30/2024 Patient will reduce dizziness handicap inventory [facility]) score to <36, for less dizziness with ADLs and increased safety with home and work tasks.  Baseline: 48%; 02/05/2024: 14%; 04/29/24: 28% Goal status: MET  2.  Patient will deny any falls over past 4 weeks to demonstrate improved balance and safe independence with functional mobility.  Baseline: reports frequent falls with 1 on day of initial eval 02/05/2024: 0 falls in past 4 weeks Goal status: MET  3.  Patient will increase Berg Balance score to > 51/56 to demonstrate improved balance and decreased fall risk during functional activities and ADLs.   Baseline: 01/15/2024: 49/56; 02/05/2024: 51/56; 03/25/24: 52/56 Goal status: MET  4.  Patient will increase 10 meter walk test to >1.69m/s using LRAD as to improve gait speed for better community ambulation and to reduce fall risk. Baseline: 0.22m/s; 02/05/2024: Average Normal speed: 0.81 no AD; 03/25/24: Average Normal speed: 0.99 m/s; 04/29/24:  1.74m/s no device, no LOB Goal status: ACHIEVED  5.  Patient will increase Functional Gait Assessment (FGA) score to >20/30 as to reduce fall risk and improve dynamic gait safety with community ambulation.  Baseline: 15/30; 02/05/2024: 16/30; 03/25/24: 23/30; 04/29/24: 24/30 Goal status: MET  ASSESSMENT:  CLINICAL IMPRESSION:   Reassessment today showing generally well maintained progress toward goals and continued improvement in other areas. HEP is modified to reflect updates. Pt charged wit good compliance over t enext 6 weeks and starting a regular walking program at [facility] [employer]. Will plan to FU in 6 weeks here to FU on continued progress, safety, and self efficacy in working on remaining impairments. Patient will benefit from skilled physical therapy intervention to reduce deficits and impairments identified in evaluation, in order to reduce pain, improve quality of life, and maximize activity tolerance for ADL, IADL, and leisure/fitness. Physical therapy will help pt achieve long and short term goals of care.    OBJECTIVE IMPAIRMENTS: Abnormal gait, cardiopulmonary status limiting activity, decreased activity tolerance, decreased balance, decreased coordination, decreased endurance, decreased knowledge of condition, decreased knowledge of use of DME, decreased mobility, difficulty walking, decreased strength, and dizziness.   ACTIVITY LIMITATIONS: carrying, lifting, bending, standing, squatting, transfers, bed mobility, bathing, toileting, dressing, reach over head, hygiene/grooming, and locomotion level  PARTICIPATION LIMITATIONS: meal prep, cleaning,  laundry, shopping, community activity, and yard work  PERSONAL FACTORS: Age, Time since onset of injury/illness/exacerbation, and 3+ comorbidities: Arthritis, clotting disorder, GERD, IBS, L TKA, spastic bladder are also affecting patient's functional outcome.   REHAB POTENTIAL: Good  CLINICAL DECISION MAKING: Evolving/moderate complexity  EVALUATION COMPLEXITY: Moderate   PLAN:  PT FREQUENCY: 1-2x/week PT DURATION: 8 weeks  PLANNED INTERVENTIONS: 97164- PT Re-evaluation, 97750- Physical Performance Testing, 97110-Therapeutic exercises, 97530- Therapeutic activity, 97112- Neuromuscular re-education, 97535- Self Care, 02859- Manual therapy, [phone]- Gait training, [phone]- Orthotic Initial, [phone]- Orthotic/Prosthetic subsequent, [phone]- Canalith repositioning, Patient/Family education, Balance training, Stair training, Taping, Joint mobilization, Spinal mobilization, Vestibular training, Visual/preceptual remediation/compensation, DME instructions, Cryotherapy, Moist heat, and Biofeedback  PLAN FOR UPCOMING SESSIONS:  FU in 6 weeks to review progress since starting home walking and strength regimen;    11:32 AM, 04/29/2024 Peggye JAYSON Linear, PT, DPT Physical Therapist - [facility]  Outpatient Physical Therapy- Main Campus [phone]

## 2024-05-02 NOTE — Progress Notes (Signed)
 Agree.  Thanks.  Arlyss EDISON Cleatus, MD 05/02/2024  2:45 PM

## 2024-05-04 ENCOUNTER — Ambulatory Visit

## 2024-05-06 ENCOUNTER — Ambulatory Visit

## 2024-05-11 ENCOUNTER — Ambulatory Visit

## 2024-05-18 ENCOUNTER — Ambulatory Visit

## 2024-05-20 ENCOUNTER — Emergency Department (HOSPITAL_BASED_OUTPATIENT_CLINIC_OR_DEPARTMENT_OTHER): Admitting: Radiology

## 2024-05-20 ENCOUNTER — Emergency Department (HOSPITAL_BASED_OUTPATIENT_CLINIC_OR_DEPARTMENT_OTHER): Admission: EM | Admit: 2024-05-20 | Discharge: 2024-05-21 | Disposition: A | Discharge status: Home / Self Care

## 2024-05-20 ENCOUNTER — Emergency Department (HOSPITAL_BASED_OUTPATIENT_CLINIC_OR_DEPARTMENT_OTHER)

## 2024-05-20 ENCOUNTER — Other Ambulatory Visit: Payer: Self-pay

## 2024-05-20 ENCOUNTER — Encounter (HOSPITAL_BASED_OUTPATIENT_CLINIC_OR_DEPARTMENT_OTHER): Payer: Self-pay

## 2024-05-20 DIAGNOSIS — R42 Dizziness and giddiness: Secondary | ICD-10-CM | POA: Insufficient documentation

## 2024-05-20 DIAGNOSIS — K449 Diaphragmatic hernia without obstruction or gangrene: Secondary | ICD-10-CM | POA: Insufficient documentation

## 2024-05-20 DIAGNOSIS — R110 Nausea: Secondary | ICD-10-CM | POA: Insufficient documentation

## 2024-05-20 DIAGNOSIS — R079 Chest pain, unspecified: Secondary | ICD-10-CM | POA: Diagnosis present

## 2024-05-20 LAB — BASIC METABOLIC PANEL WITH GFR
Anion gap: 12 (ref 5–15)
BUN: 13 mg/dL (ref 8–23)
CO2: 26 mmol/L (ref 22–32)
Calcium: 10.1 mg/dL (ref 8.9–10.3)
Chloride: 103 mmol/L (ref 98–111)
Creatinine, Ser: 0.82 mg/dL (ref 0.44–1.00)
GFR, Estimated: >60 mL/min
Glucose, Bld: 131 mg/dL — ABNORMAL HIGH (ref 70–99)
Potassium: 4.1 mmol/L (ref 3.5–5.1)
Sodium: 141 mmol/L (ref 135–145)

## 2024-05-20 LAB — CBC
HCT: 39.8 % (ref 36.0–46.0)
Hemoglobin: 12.9 g/dL (ref 12.0–15.0)
MCH: 28.6 pg (ref 26.0–34.0)
MCHC: 32.4 g/dL (ref 30.0–36.0)
MCV: 88.2 fL (ref 80.0–100.0)
Platelets: 269 K/uL (ref 150–400)
RBC: 4.51 MIL/uL (ref 3.87–5.11)
RDW: 13 % (ref 11.5–15.5)
WBC: 8.3 K/uL (ref 4.0–10.5)
nRBC: 0 % (ref 0.0–0.2)

## 2024-05-20 LAB — TROPONIN T, HIGH SENSITIVITY: Troponin T High Sensitivity: <15 ng/L (ref 0–19)

## 2024-05-20 MED ORDER — IOHEXOL 350 MG/ML SOLN
100.0000 mL | Freq: Once | INTRAVENOUS | Status: AC | PRN
  Administered 2024-05-20: 100 mL via INTRAVENOUS

## 2024-05-20 MED ORDER — LIDOCAINE VISCOUS HCL 2 % MT SOLN
15.0000 mL | Freq: Once | OROMUCOSAL | Status: AC
  Administered 2024-05-20: 15 mL via ORAL
  Filled 2024-05-20: qty 15

## 2024-05-20 MED ORDER — ALUM & MAG HYDROXIDE-SIMETH 200-200-20 MG/5ML PO SUSP
30.0000 mL | Freq: Once | ORAL | Status: AC
  Administered 2024-05-20: 30 mL via ORAL
  Filled 2024-05-20: qty 30

## 2024-05-20 NOTE — ED Provider Notes (Signed)
 " [place] EMERGENCY DEPARTMENT AT [facility] Provider Note   CSN: [id number] Arrival date & time: 05/20/24  2208     Patient presents with: Chest Pain   Jennifer Frank is a 74 y.o. female.   74 year old female presents for evaluation of chest pain.  States this has happened before and usually gets better with Mylanta or Tagamet.  She states she had chest pain in the center of her chest that radiated under her left breast and over to her left arm up her neck and through to her back.  She states it is lasting longer than usual and seems to be more severe than usual.  States it comes in waves and right now is dull but earlier was fairly persistent and pressure-like.  She admits to some nausea and lightheadedness when these symptoms come on.  Denies any other symptoms or concerns at this time.   Chest Pain Associated symptoms: no abdominal pain, no back pain, no cough, no fever, no palpitations, no shortness of breath and no vomiting        Prior to Admission medications  Medication Sig Start Date End Date Taking? Authorizing Provider  betamethasone  valerate (VALISONE ) 0.1 % cream Apply topically daily as needed. 03/15/24   [name], MD  Cholecalciferol (VITAMIN D3) 50 MCG (2000 UT) capsule Take 2 capsules (4,000 Units total) by mouth daily. 03/20/24   [name], MD  furosemide  (LASIX ) 20 MG tablet TAKE 1 TABLET (20 MG TOTAL) BY MOUTH DAILY AS NEEDED FOR FLUID. 06/18/23   [name], MD  pantoprazole  (PROTONIX ) 20 MG tablet Take 1 tablet (20 mg total) by mouth daily. 03/15/24   [name], MD  rosuvastatin  (CRESTOR ) 5 MG tablet Take 1 tablet (5 mg total) by mouth daily. 03/24/24   [name], MD  traZODone  (DESYREL ) 50 MG tablet TAKE 0.5-1 TABLETS BY MOUTH AT BEDTIME AS NEEDED FOR SLEEP. 07/31/23   [name], MD    Allergies: Codeine, Darvocet [propoxyphene n-acetaminophen ], Morphine  and codeine, Oxybutynin , and Vioxx [rofecoxib]     Review of Systems  Constitutional:  Negative for chills and fever.  HENT:  Negative for ear pain and sore throat.   Eyes:  Negative for pain and visual disturbance.  Respiratory:  Negative for cough and shortness of breath.   Cardiovascular:  Positive for chest pain. Negative for palpitations.  Gastrointestinal:  Negative for abdominal pain and vomiting.  Genitourinary:  Negative for dysuria and hematuria.  Musculoskeletal:  Negative for arthralgias and back pain.  Skin:  Negative for color change and rash.  Neurological:  Negative for seizures and syncope.  All other systems reviewed and are negative.   Updated Vital Signs BP 126/72 (BP Location: Left Arm)   Pulse 70   Temp 97.6 F (36.4 C)   Resp 17   Ht 5' 10 (1.778 m)   Wt 92.1 kg   SpO2 98%   BMI 29.13 kg/m   Physical Exam Vitals and nursing note reviewed.  Constitutional:      General: She is not in acute distress.    Appearance: She is well-developed. She is not ill-appearing.  HENT:     Head: Normocephalic and atraumatic.  Eyes:     Conjunctiva/sclera: Conjunctivae normal.  Cardiovascular:     Rate and Rhythm: Normal rate and regular rhythm.     Heart sounds: No murmur heard. Pulmonary:     Effort: Pulmonary effort is normal. No respiratory distress.  Breath sounds: Normal breath sounds.  Abdominal:     Palpations: Abdomen is soft.     Tenderness: There is no abdominal tenderness.  Musculoskeletal:        General: No swelling.     Cervical back: Neck supple.  Skin:    General: Skin is warm and dry.     Capillary Refill: Capillary refill takes less than 2 seconds.  Neurological:     General: No focal deficit present.     Mental Status: She is alert.  Psychiatric:        Mood and Affect: Mood normal.     (all labs ordered are listed, but only abnormal results are displayed) Labs Reviewed  BASIC METABOLIC PANEL WITH GFR  CBC  TROPONIN T, HIGH SENSITIVITY    EKG: EKG  Interpretation Date/Time:  Thursday May 20 2024 22:15:18 EST Ventricular Rate:  68 PR Interval:  136 QRS Duration:  88 QT Interval:  406 QTC Calculation: 431 R Axis:   35  Text Interpretation: Normal sinus rhythm Age indeterminate inferior infarct Nonspecific ST changes in the anterior leads  Compared with prior EKG from 11/17/2023 Confirmed by Gennaro Bouchard ([id number]) on 05/20/2024 10:36:51 PM  Radiology: No results found.   Procedures   Medications Ordered in the ED  alum & mag hydroxide-simeth (MAALOX/MYLANTA) 200-200-20 MG/5ML suspension 30 mL (has no administration in time range)    And  lidocaine  (XYLOCAINE ) 2 % viscous mouth solution 15 mL (has no administration in time range)                                    Medical Decision Making Cardiac monitor interpretation: Sinus rhythm, no ectopy  Patient here for chest pain.  She appears well on initial exam, but her pain has been coming in waves.  Does radiate to her neck and through to her back.  CTA dissection study ordered as well as labs which are pending at this time.  EKG reviewed by me and fairly unremarkable for any acute changes.  Patient signed out to oncoming provider at 11 PM pending remainder of workup and ultimate disposition.  Problems Addressed: Chest pain, unspecified type: undiagnosed new problem with uncertain prognosis  Amount and/or Complexity of Data Reviewed External Data Reviewed: notes.    Details: No prior ER records for review Labs: ordered. Decision-making details documented in ED Course.    Details: Ordered and pending Radiology: ordered and independent interpretation performed. Decision-making details documented in ED Course.    Details: Ordered and pending ECG/medicine tests: ordered and independent interpretation performed. Decision-making details documented in ED Course.    Details: Ordered and interpreted by me in the absence of cardiology and shows sinus rhythm, no STEMI, or significant  change when compared to prior EKG  Risk OTC drugs. Prescription drug management.     Final diagnoses:  Chest pain, unspecified type    ED Discharge Orders     None          Gennaro Bouchard CROME, DO 05/20/24 2246  "

## 2024-05-20 NOTE — ED Triage Notes (Signed)
 Patient arrived POV from home with complaint of chest pain just below left breast radiating to shoulder blades and up left side of neck x 2 hours.   Reports pain is intermittent,   Reports taking 3 pepside and 1 spoonful of Mylanta.   Denies SOB, Nausea, Vomiting.

## 2024-05-20 NOTE — ED Provider Notes (Signed)
 Received patient in turnover from Dr. Gennaro.  Please see their note for further details of Hx, PE.  Briefly patient is a 74 y.o. female with a Chest Pain .  Patient with chest pain, felt like reflux but started to radiate to arm and back.  Plan for CTA, labs, delta trop.   CTa with possible esophagitis.  Negative for large PE for pneumonia or other intra-abdominal or thoracic abnormality.  2 troponins are both negative.  I discussed results with patient and family.  Will treat as esophagitis.  PCP follow-up.   Emil Share, DO 05/21/24 0210

## 2024-05-21 LAB — TROPONIN T, HIGH SENSITIVITY: Troponin T High Sensitivity: <15 ng/L (ref 0–19)

## 2024-05-21 NOTE — ED Notes (Signed)

## 2024-05-21 NOTE — Discharge Instructions (Signed)
 Try pepcid  or tagamet up to twice a day.  Try to avoid things that may make this worse, most commonly these are spicy foods tomato based products fatty foods chocolate and peppermint.  Alcohol and tobacco can also make this worse.  Return to the emergency department for sudden worsening pain fever or inability to eat or drink.

## 2024-05-25 ENCOUNTER — Ambulatory Visit

## 2024-05-27 ENCOUNTER — Ambulatory Visit

## 2024-06-01 ENCOUNTER — Ambulatory Visit: Admitting: Family Medicine

## 2024-06-01 ENCOUNTER — Ambulatory Visit

## 2024-06-01 VITALS — BP 120/72 | HR 80 | Temp 98.2°F | Ht 70.0 in | Wt 207.4 lb

## 2024-06-01 DIAGNOSIS — R251 Tremor, unspecified: Secondary | ICD-10-CM | POA: Diagnosis not present

## 2024-06-01 DIAGNOSIS — K209 Esophagitis, unspecified without bleeding: Secondary | ICD-10-CM | POA: Diagnosis not present

## 2024-06-01 NOTE — Patient Instructions (Addendum)
 Refer back to GI.  Let me check with Dr. Evonnie in the meantime.  Keep taking pantoprazole .  Don't take ibuprofen for now.  Take care.  Glad to see you.

## 2024-06-01 NOTE — Progress Notes (Unsigned)
 ER eval for CP.  Troponin neg CT with findings compatible with distal esophagitis. Small hiatal hernia. She had been taking ibuprofen for joint pain prior to sx onset.   D/w pt about PPI.  She had been off PPI for about 10 days prior.   She has occ sensation of food sticking.    Life stressors d/w pt.    Tremor d/w pt.  Inc tremor in the B arms and R leg.    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   She has gone to PT in the meantime.   Prev saw Dr. Evonnie 11/11/22.  More tremor holding a cup, not at rest.  More tremor with higher stress situations but present o/w.    Meds, vitals, and allergies reviewed.   ROS: Per HPI unless specifically indicated in ROS section   Nad Ncat Neck supple, no LA Rrr Ctab Abd soft not ttp Skin well perfused.  R>L hand tremor holding paper.   No focal weakness in the ext x4, normal gross sensation.  Speech wnl.  CN 2-12 wnl o/w.   32 minutes were devoted to patient care in this encounter (this includes time spent reviewing the patient's file/history, interviewing and examining the patient, counseling/reviewing plan with patient).

## 2024-06-02 ENCOUNTER — Telehealth: Payer: Self-pay | Admitting: Family Medicine

## 2024-06-02 NOTE — Assessment & Plan Note (Signed)
 See following phone note.  I need input from Dr. Evonnie.

## 2024-06-02 NOTE — Assessment & Plan Note (Signed)
 Refer back to GI.  Keep taking pantoprazole .  Don't take ibuprofen for now.  Okay for outpatient f/u.

## 2024-06-02 NOTE — Telephone Encounter (Signed)
 Dr. Evonnie,   This patient has a progressive tremor, especially in the right hand.  I saw your previous note.  Is it is possible for her to have a multifactorial tremor, ie could some of her symptoms be related to an intention tremor?  Did not yet offer primidone since I wanted your advice.  Thanks.

## 2024-06-03 ENCOUNTER — Ambulatory Visit

## 2024-06-03 NOTE — Telephone Encounter (Signed)
 Please update patient.  I checked with Dr. Evonnie.  When she was seen by neuro, she had a functional tremor and the treatment is through a counselor who is familiar with that, not through meds.  Let me know if she wants to get set up with counseling.  Thanks.

## 2024-06-03 NOTE — Telephone Encounter (Signed)
 Did you want to send this to a provider or do we need to send somewhere for you?

## 2024-06-03 NOTE — Telephone Encounter (Signed)
 I meant to send this to Dr. Evonnie.  Thanks.

## 2024-06-04 NOTE — Telephone Encounter (Signed)
 Pt states she is not interested in counseling for her tremors at them moment.  She States If she changes her mind she will give us  a call.

## 2024-06-04 NOTE — Telephone Encounter (Signed)
 Noted. Thanks.

## 2024-06-05 ENCOUNTER — Encounter: Payer: Self-pay | Admitting: Internal Medicine

## 2024-06-06 ENCOUNTER — Encounter: Payer: Self-pay | Admitting: Family Medicine

## 2024-06-07 NOTE — Telephone Encounter (Signed)
 See 06/02/24 phn note.

## 2024-06-11 ENCOUNTER — Other Ambulatory Visit: Payer: Self-pay | Admitting: Family Medicine

## 2024-06-11 DIAGNOSIS — R251 Tremor, unspecified: Secondary | ICD-10-CM

## 2024-06-17 ENCOUNTER — Ambulatory Visit

## 2024-06-24 ENCOUNTER — Other Ambulatory Visit: Payer: Self-pay

## 2024-06-24 ENCOUNTER — Emergency Department (HOSPITAL_BASED_OUTPATIENT_CLINIC_OR_DEPARTMENT_OTHER)

## 2024-06-24 ENCOUNTER — Emergency Department (HOSPITAL_BASED_OUTPATIENT_CLINIC_OR_DEPARTMENT_OTHER)
Admission: EM | Admit: 2024-06-24 | Discharge: 2024-06-25 | Disposition: A | Source: Home / Self Care | Attending: Emergency Medicine | Admitting: Emergency Medicine

## 2024-06-24 ENCOUNTER — Encounter (HOSPITAL_BASED_OUTPATIENT_CLINIC_OR_DEPARTMENT_OTHER): Payer: Self-pay

## 2024-06-24 DIAGNOSIS — N200 Calculus of kidney: Secondary | ICD-10-CM

## 2024-06-24 LAB — CBC WITH DIFFERENTIAL/PLATELET
Abs Immature Granulocytes: 0.07 10*3/uL (ref 0.00–0.07)
Basophils Absolute: 0 10*3/uL (ref 0.0–0.1)
Basophils Relative: 0 %
Eosinophils Absolute: 0.1 10*3/uL (ref 0.0–0.5)
Eosinophils Relative: 1 %
HCT: 40 % (ref 36.0–46.0)
Hemoglobin: 13 g/dL (ref 12.0–15.0)
Immature Granulocytes: 1 %
Lymphocytes Relative: 15 %
Lymphs Abs: 1.5 10*3/uL (ref 0.7–4.0)
MCH: 28.4 pg (ref 26.0–34.0)
MCHC: 32.5 g/dL (ref 30.0–36.0)
MCV: 87.5 fL (ref 80.0–100.0)
Monocytes Absolute: 0.6 10*3/uL (ref 0.1–1.0)
Monocytes Relative: 5 %
Neutro Abs: 8.2 10*3/uL — ABNORMAL HIGH (ref 1.7–7.7)
Neutrophils Relative %: 78 %
Platelets: 288 10*3/uL (ref 150–400)
RBC: 4.57 MIL/uL (ref 3.87–5.11)
RDW: 12.9 % (ref 11.5–15.5)
WBC: 10.4 10*3/uL (ref 4.0–10.5)
nRBC: 0 % (ref 0.0–0.2)

## 2024-06-24 LAB — COMPREHENSIVE METABOLIC PANEL WITH GFR
ALT: 12 U/L (ref 0–44)
AST: 16 U/L (ref 15–41)
Albumin: 4.3 g/dL (ref 3.5–5.0)
Alkaline Phosphatase: 103 U/L (ref 38–126)
Anion gap: 15 (ref 5–15)
BUN: 12 mg/dL (ref 8–23)
CO2: 26 mmol/L (ref 22–32)
Calcium: 9.9 mg/dL (ref 8.9–10.3)
Chloride: 104 mmol/L (ref 98–111)
Creatinine, Ser: 1.09 mg/dL — ABNORMAL HIGH (ref 0.44–1.00)
GFR, Estimated: 53 mL/min — ABNORMAL LOW
Glucose, Bld: 149 mg/dL — ABNORMAL HIGH (ref 70–99)
Potassium: 3.8 mmol/L (ref 3.5–5.1)
Sodium: 144 mmol/L (ref 135–145)
Total Bilirubin: 0.3 mg/dL (ref 0.0–1.2)
Total Protein: 7.9 g/dL (ref 6.5–8.1)

## 2024-06-24 LAB — LIPASE, BLOOD: Lipase: 45 U/L (ref 11–51)

## 2024-06-24 MED ORDER — MORPHINE SULFATE (PF) 4 MG/ML IV SOLN
4.0000 mg | Freq: Once | INTRAVENOUS | Status: AC
  Administered 2024-06-24: 4 mg via INTRAVENOUS
  Filled 2024-06-24: qty 1

## 2024-06-24 MED ORDER — SODIUM CHLORIDE 0.9 % IV BOLUS
1000.0000 mL | Freq: Once | INTRAVENOUS | Status: AC
  Administered 2024-06-24: 1000 mL via INTRAVENOUS

## 2024-06-24 MED ORDER — ONDANSETRON HCL 4 MG/2ML IJ SOLN
4.0000 mg | Freq: Once | INTRAMUSCULAR | Status: AC
  Administered 2024-06-24: 4 mg via INTRAVENOUS
  Filled 2024-06-24: qty 2

## 2024-06-24 NOTE — ED Notes (Signed)
 Patient transported to CT

## 2024-06-24 NOTE — ED Notes (Signed)
 Pt aware of need for urine sample. Unable to provide one at this time. Instructed to press call bell when able.

## 2024-06-24 NOTE — ED Provider Notes (Incomplete)
 " [place] [facility] AT [facility] Provider Note   CSN: [id number] Arrival date & time: 06/24/24  2151     Patient presents with: Flank Pain (left)  HPI Jennifer Frank is a 74 y.o. female with IBS, GERD presenting for left flank pain.  The pain was all of a sudden in started around 8 PM.  She states that the pain did radiate around to the left mid abdomen.  She denies chest pain, shortness of breath or pain in the midline of her back.  She vomited once after started.  Endorses some nausea no issues with bowel movements.  Denies urinary or vaginal symptoms.  Denies prior abdominal surgeries.  {Add pertinent medical, surgical, social history, OB history to HPI:32947}  Flank Pain       Prior to Admission medications  Medication Sig Start Date End Date Taking? Authorizing Provider  betamethasone  valerate (VALISONE ) 0.1 % cream Apply topically daily as needed. 03/15/24   [name], MD  Cholecalciferol (VITAMIN D3) 50 MCG (2000 UT) capsule Take 2 capsules (4,000 Units total) by mouth daily. 03/20/24   [name], MD  furosemide  (LASIX ) 20 MG tablet TAKE 1 TABLET (20 MG TOTAL) BY MOUTH DAILY AS NEEDED FOR FLUID. 06/18/23   [name], MD  pantoprazole  (PROTONIX ) 20 MG tablet Take 1 tablet (20 mg total) by mouth daily. 03/15/24   [name], MD  rosuvastatin  (CRESTOR ) 5 MG tablet Take 1 tablet (5 mg total) by mouth daily. 03/24/24   [name], MD  traZODone  (DESYREL ) 50 MG tablet TAKE 0.5-1 TABLETS BY MOUTH AT BEDTIME AS NEEDED FOR SLEEP. 07/31/23   [name], MD    Allergies: Codeine, Darvocet [propoxyphene n-acetaminophen ], Morphine  and codeine, Oxybutynin , and Vioxx [rofecoxib]    Review of Systems  Genitourinary:  Positive for flank pain.    Updated Vital Signs BP (!) 155/94 (BP Location: Left Arm)   Pulse 70   Temp 98.5 F (36.9 C) (Oral)   Resp 16   SpO2 100%   Physical Exam Vitals and nursing note reviewed.  HENT:      Head: Normocephalic and atraumatic.     Mouth/Throat:     Mouth: Mucous membranes are moist.  Eyes:     General:        Right eye: No discharge.        Left eye: No discharge.     Conjunctiva/sclera: Conjunctivae normal.  Cardiovascular:     Rate and Rhythm: Normal rate and regular rhythm.     Pulses: Normal pulses.     Heart sounds: Normal heart sounds.  Pulmonary:     Effort: Pulmonary effort is normal.     Breath sounds: Normal breath sounds.  Abdominal:     General: Abdomen is flat. There is no distension.     Palpations: Abdomen is soft.     Tenderness: There is no abdominal tenderness. There is left CVA tenderness.  Skin:    General: Skin is warm and dry.  Neurological:     General: No focal deficit present.  Psychiatric:        Mood and Affect: Mood normal.     (all labs ordered are listed, but only abnormal results are displayed) Labs Reviewed  CBC WITH DIFFERENTIAL/PLATELET  COMPREHENSIVE METABOLIC PANEL WITH GFR  LIPASE, BLOOD  URINALYSIS, ROUTINE W REFLEX MICROSCOPIC    EKG: None  Radiology: CT Renal Stone Study Result Date: 06/24/2024 EXAM: CT ABDOMEN AND PELVIS WITHOUT  CONTRAST 06/24/2024 10:45:06 PM TECHNIQUE: CT of the abdomen and pelvis was performed without the administration of intravenous contrast. Multiplanar reformatted images are provided for review. Automated exposure control, iterative reconstruction, and/or weight-based adjustment of the mA/kV was utilized to reduce the radiation dose to as low as reasonably achievable. COMPARISON: CTA abdomen/pelvis dated 05/20/2024. CLINICAL HISTORY: Abdominal/flank pain, stone suspected. FINDINGS: LOWER CHEST: No acute abnormality. LIVER: Mild hepatic steatosis. GALLBLADDER AND BILE DUCTS: Gallbladder is unremarkable. No biliary ductal dilatation. SPLEEN: No acute abnormality. PANCREAS: No acute abnormality. ADRENAL GLANDS: No acute abnormality. KIDNEYS, URETERS AND BLADDER: 9 mm calculus in the left proximal  collecting system with mild left hydronephrosis. No stones in the right kidney or ureter. No right hydronephrosis. No perinephric or periureteral stranding. Urinary bladder is unremarkable. GI AND BOWEL: Small hiatal hernia. Stomach demonstrates no acute abnormality. Appendix is within normal limits (image 57). There is no bowel obstruction. PERITONEUM AND RETROPERITONEUM: No ascites. No free air. VASCULATURE: Aorta is normal in caliber. LYMPH NODES: No lymphadenopathy. REPRODUCTIVE ORGANS: Uterus is grossly unremarkable, noting streak artifact. BONES AND SOFT TISSUES: Bilateral hip arthroplasties. No acute osseous abnormality. No focal soft tissue abnormality. IMPRESSION: 1. 9 mm calculus in the left proximal collecting system with mild left hydronephrosis. Electronically signed by: Pinkie Pebbles MD 06/24/2024 10:49 PM EST RP Workstation: [id number]    {Document cardiac monitor, telemetry assessment procedure when appropriate:32947} Procedures   Medications Ordered in the ED  ondansetron  (ZOFRAN ) injection 4 mg (4 mg Intravenous Given 06/24/24 2256)  sodium chloride  0.9 % bolus 1,000 mL (1,000 mLs Intravenous New Bag/Given 06/24/24 2307)  morphine  (PF) 4 MG/ML injection 4 mg (4 mg Intravenous Given 06/24/24 2256)    Clinical Course as of 06/24/24 2331  Thu Jun 24, 2024  2327 Stable  73 YOF with a 9 mm kidney stone Pain moderate an but improving.  [CC]    Clinical Course User Index [CC] Jerral Meth, MD   {Click here for ABCD2, HEART and other calculators REFRESH Note before signing:1}                              Medical Decision Making Amount and/or Complexity of Data Reviewed Labs: ordered. Radiology: ordered.  Risk Prescription drug management.   74 year old well-appearing female presenting for left flank pain.  Exam notable for left CVA tenderness.  DDx includes kidney stone, pyelonephritis, diverticulitis, less likely ACS, PE, dissection.  Thus far, CT renal showing 9 mm  calculus in the left proximal collecting system with mild left hydronephrosis.  Pertinent labs are pending.  Patient does appear to be in pain but otherwise no acute distress, vitals reassuring and remains well-appearing. Signed out patient to oncoming ED attending, Dr. Jerral.    {Document critical care time when appropriate  Document review of labs and clinical decision tools ie CHADS2VASC2, etc  Document your independent review of radiology images and any outside records  Document your discussion with family members, caretakers and with consultants  Document social determinants of health affecting pt's care  Document your decision making why or why not admission, treatments were needed:32947:::1}   Final diagnoses:  Kidney stone    ED Discharge Orders     None        "

## 2024-06-24 NOTE — ED Triage Notes (Signed)
 POV, Complaints of sudden left flank severe pain that started today at 2000. Pain was so severe that made patient vomit her dinner.  Pt alert and oriented x4. Pain 10 out 10.

## 2024-06-25 ENCOUNTER — Encounter: Payer: Self-pay | Admitting: Internal Medicine

## 2024-06-25 LAB — URINALYSIS, ROUTINE W REFLEX MICROSCOPIC
Bilirubin Urine: NEGATIVE
Glucose, UA: NEGATIVE mg/dL
Ketones, ur: NEGATIVE mg/dL
Leukocytes,Ua: NEGATIVE
Nitrite: NEGATIVE
Protein, ur: 30 mg/dL — AB
RBC / HPF: >50 RBC/hpf (ref 0–5)
Specific Gravity, Urine: 1.035 — ABNORMAL HIGH (ref 1.005–1.030)
pH: 5.5 (ref 5.0–8.0)

## 2024-06-25 MED ORDER — OXYCODONE HCL 5 MG PO TABS
5.0000 mg | ORAL_TABLET | Freq: Once | ORAL | Status: AC
  Administered 2024-06-25: 5 mg via ORAL
  Filled 2024-06-25: qty 1

## 2024-06-25 MED ORDER — TAMSULOSIN HCL 0.4 MG PO CAPS: 0.4000 mg | ORAL_CAPSULE | Freq: Every day | ORAL | 0 refills | Status: AC

## 2024-06-25 MED ORDER — OXYCODONE HCL 5 MG PO TABS: 5.0000 mg | ORAL_TABLET | Freq: Four times a day (QID) | ORAL | 0 refills | Status: AC | PRN

## 2024-06-25 MED ORDER — ONDANSETRON HCL 4 MG PO TABS: 4.0000 mg | ORAL_TABLET | Freq: Four times a day (QID) | ORAL | 0 refills | Status: AC

## 2024-06-25 MED ORDER — KETOROLAC TROMETHAMINE 15 MG/ML IJ SOLN
15.0000 mg | Freq: Once | INTRAMUSCULAR | Status: AC
  Administered 2024-06-25: 15 mg via INTRAVENOUS
  Filled 2024-06-25: qty 1

## 2024-06-25 MED ORDER — TAMSULOSIN HCL 0.4 MG PO CAPS
0.4000 mg | ORAL_CAPSULE | Freq: Once | ORAL | Status: AC
  Administered 2024-06-25: 0.4 mg via ORAL
  Filled 2024-06-25: qty 1

## 2024-06-25 MED ORDER — ACETAMINOPHEN 500 MG PO TABS
1000.0000 mg | ORAL_TABLET | Freq: Once | ORAL | Status: AC
  Administered 2024-06-25: 1000 mg via ORAL
  Filled 2024-06-25: qty 2

## 2024-06-25 NOTE — ED Provider Notes (Signed)
 Care of patient received from prior provider at 1:17 AM, please see their note for complete H/P and care plan.  Received handoff per ED course.  Clinical Course as of 06/25/24 0117  Thu Jun 24, 2024  2327 Stable  82 YOF with a 9 mm kidney stone Pain moderate an but improving.  [CC]  Fri Jun 25, 2024  0054 Squamous Epithelial / HPF: 6-10 [CC]    Clinical Course User Index [CC] Jerral Meth, MD    Reassessment: Reassessed.  Patient's symptoms are all improved.  She does have a 9 mm kidney stone but is urinating comfortably.  Not a clean-catch but mostly red blood cells with only few bacteria. Given lack of leukocytosis or fever or dysuria I do not believe that UTI is involved. Patient states she already sees urology for chronic incontinence and states that she feels comfortable following up with them in the outpatient setting without further intervention here in the emergency room.  Supportive care medication sent to pharmacy for pain, nausea and medical expulsive therapy also sent.  Recommended 48-hour follow-up with urology or returning to the emergency room with any acute changes.  Specifically educated on importance of returning if she develops infectious or obstructive pathology and expressed understanding.   Jerral Meth, MD 06/25/24 [phone]

## 2024-06-25 NOTE — ED Notes (Signed)
 ED Provider at bedside.

## 2025-02-15 ENCOUNTER — Ambulatory Visit

## 2025-02-16 ENCOUNTER — Ambulatory Visit
